# Patient Record
Sex: Female | Born: 1950 | Hispanic: No | Marital: Married | Smoking: Never smoker
Health system: Southern US, Community
[De-identification: ages and names within clinical notes are randomized; demographics above are authoritative.]

## PROBLEM LIST (undated history)

## (undated) DIAGNOSIS — D649 Anemia, unspecified: Secondary | ICD-10-CM

## (undated) DIAGNOSIS — G4730 Sleep apnea, unspecified: Secondary | ICD-10-CM

## (undated) DIAGNOSIS — I10 Essential (primary) hypertension: Secondary | ICD-10-CM

## (undated) DIAGNOSIS — E119 Type 2 diabetes mellitus without complications: Secondary | ICD-10-CM

## (undated) DIAGNOSIS — N189 Chronic kidney disease, unspecified: Secondary | ICD-10-CM

## (undated) DIAGNOSIS — F32A Depression, unspecified: Secondary | ICD-10-CM

## (undated) DIAGNOSIS — K219 Gastro-esophageal reflux disease without esophagitis: Secondary | ICD-10-CM

## (undated) DIAGNOSIS — E785 Hyperlipidemia, unspecified: Secondary | ICD-10-CM

## (undated) DIAGNOSIS — A419 Sepsis, unspecified organism: Secondary | ICD-10-CM

## (undated) DIAGNOSIS — S46009A Unspecified injury of muscle(s) and tendon(s) of the rotator cuff of unspecified shoulder, initial encounter: Secondary | ICD-10-CM

## (undated) DIAGNOSIS — G600 Hereditary motor and sensory neuropathy: Secondary | ICD-10-CM

## (undated) DIAGNOSIS — F329 Major depressive disorder, single episode, unspecified: Secondary | ICD-10-CM

## (undated) HISTORY — DX: Sleep apnea, unspecified: G47.30

## (undated) HISTORY — DX: Depression, unspecified: F32.A

## (undated) HISTORY — DX: Chronic kidney disease, unspecified: N18.9

## (undated) HISTORY — PX: HERNIA REPAIR: SHX51

## (undated) HISTORY — DX: Gastro-esophageal reflux disease without esophagitis: K21.9

## (undated) HISTORY — DX: Unspecified injury of muscle(s) and tendon(s) of the rotator cuff of unspecified shoulder, initial encounter: S46.009A

## (undated) HISTORY — DX: Major depressive disorder, single episode, unspecified: F32.9

## (undated) HISTORY — PX: FRACTURE SURGERY: SHX138

## (undated) HISTORY — DX: Hyperlipidemia, unspecified: E78.5

## (undated) HISTORY — PX: OTHER SURGICAL HISTORY: SHX169

## (undated) HISTORY — DX: Sepsis, unspecified organism: A41.9

## (undated) HISTORY — DX: Essential (primary) hypertension: I10

## (undated) HISTORY — DX: Hereditary motor and sensory neuropathy: G60.0

## (undated) HISTORY — DX: Anemia, unspecified: D64.9

## (undated) HISTORY — DX: Type 2 diabetes mellitus without complications: E11.9

## (undated) MED FILL — Iron Sucrose Inj 20 MG/ML (Fe Equiv): INTRAVENOUS | Qty: 10 | Status: AC

---

## 2005-12-17 ENCOUNTER — Ambulatory Visit: Payer: Self-pay | Admitting: Family Medicine

## 2008-08-31 ENCOUNTER — Ambulatory Visit: Payer: Self-pay | Admitting: Family Medicine

## 2009-06-04 ENCOUNTER — Ambulatory Visit: Payer: Self-pay | Admitting: Family Medicine

## 2009-08-20 ENCOUNTER — Ambulatory Visit: Payer: Self-pay | Admitting: Family Medicine

## 2009-09-23 ENCOUNTER — Ambulatory Visit: Payer: Self-pay | Admitting: Family Medicine

## 2009-09-30 ENCOUNTER — Ambulatory Visit: Payer: Self-pay | Admitting: Urology

## 2009-10-03 ENCOUNTER — Ambulatory Visit: Payer: Self-pay | Admitting: Urology

## 2009-10-28 ENCOUNTER — Ambulatory Visit: Payer: Self-pay | Admitting: Urology

## 2010-02-24 ENCOUNTER — Ambulatory Visit: Payer: Self-pay | Admitting: Urology

## 2010-07-24 ENCOUNTER — Ambulatory Visit: Payer: Self-pay | Admitting: Family Medicine

## 2011-02-26 ENCOUNTER — Ambulatory Visit: Payer: Self-pay | Admitting: Family Medicine

## 2011-08-20 DIAGNOSIS — S46009A Unspecified injury of muscle(s) and tendon(s) of the rotator cuff of unspecified shoulder, initial encounter: Secondary | ICD-10-CM

## 2011-08-20 HISTORY — DX: Unspecified injury of muscle(s) and tendon(s) of the rotator cuff of unspecified shoulder, initial encounter: S46.009A

## 2013-06-07 ENCOUNTER — Ambulatory Visit: Payer: Self-pay | Admitting: Family Medicine

## 2013-08-08 ENCOUNTER — Inpatient Hospital Stay: Payer: Self-pay | Admitting: Internal Medicine

## 2013-08-08 LAB — URINALYSIS, COMPLETE
Bacteria: NONE SEEN
Glucose,UR: NEGATIVE mg/dL (ref 0–75)
Leukocyte Esterase: NEGATIVE
Nitrite: NEGATIVE
Ph: 5 (ref 4.5–8.0)
RBC,UR: 1 /HPF (ref 0–5)
Specific Gravity: 1.01 (ref 1.003–1.030)
WBC UR: 2 /HPF (ref 0–5)

## 2013-08-08 LAB — COMPREHENSIVE METABOLIC PANEL
Albumin: 3.8 g/dL (ref 3.4–5.0)
Alkaline Phosphatase: 26 U/L — ABNORMAL LOW (ref 50–136)
Anion Gap: 10 (ref 7–16)
Bilirubin,Total: 0.4 mg/dL (ref 0.2–1.0)
Chloride: 105 mmol/L (ref 98–107)
Co2: 22 mmol/L (ref 21–32)
Creatinine: 1.56 mg/dL — ABNORMAL HIGH (ref 0.60–1.30)
EGFR (African American): 41 — ABNORMAL LOW
EGFR (Non-African Amer.): 35 — ABNORMAL LOW
Potassium: 4.4 mmol/L (ref 3.5–5.1)
SGOT(AST): 49 U/L — ABNORMAL HIGH (ref 15–37)
Sodium: 137 mmol/L (ref 136–145)
Total Protein: 7.5 g/dL (ref 6.4–8.2)

## 2013-08-08 LAB — CK-MB: CK-MB: 6.7 ng/mL — ABNORMAL HIGH (ref 0.5–3.6)

## 2013-08-08 LAB — CBC WITH DIFFERENTIAL/PLATELET
Basophil %: 0.9 %
Eosinophil %: 0.2 %
Lymphocyte %: 14.9 %
MCH: 27.7 pg (ref 26.0–34.0)
MCHC: 33.5 g/dL (ref 32.0–36.0)
Monocyte #: 1.3 x10 3/mm — ABNORMAL HIGH (ref 0.2–0.9)
Monocyte %: 10.3 %
Neutrophil #: 9.2 10*3/uL — ABNORMAL HIGH (ref 1.4–6.5)
RBC: 3.82 10*6/uL (ref 3.80–5.20)

## 2013-08-08 LAB — CK TOTAL AND CKMB (NOT AT ARMC)
CK, Total: 1588 U/L — ABNORMAL HIGH (ref 21–215)
CK, Total: 1802 U/L — ABNORMAL HIGH (ref 21–215)

## 2013-08-08 LAB — RAPID INFLUENZA A&B ANTIGENS

## 2013-08-08 LAB — TROPONIN I
Troponin-I: <0.02 ng/mL
Troponin-I: <0.02 ng/mL

## 2013-08-09 LAB — CBC WITH DIFFERENTIAL/PLATELET
Basophil #: 0.1 10*3/uL (ref 0.0–0.1)
Eosinophil #: 0.2 10*3/uL (ref 0.0–0.7)
Eosinophil %: 2.5 %
Lymphocyte #: 2.3 10*3/uL (ref 1.0–3.6)
MCV: 84 fL (ref 80–100)
Monocyte #: 0.9 x10 3/mm (ref 0.2–0.9)
Monocyte %: 11.3 %
Neutrophil %: 55 %
RDW: 15.3 % — ABNORMAL HIGH (ref 11.5–14.5)
WBC: 7.5 10*3/uL (ref 3.6–11.0)

## 2013-08-09 LAB — BASIC METABOLIC PANEL
Anion Gap: 5 — ABNORMAL LOW (ref 7–16)
BUN: 20 mg/dL — ABNORMAL HIGH (ref 7–18)
Co2: 24 mmol/L (ref 21–32)
EGFR (African American): 54 — ABNORMAL LOW
EGFR (Non-African Amer.): 47 — ABNORMAL LOW
Glucose: 134 mg/dL — ABNORMAL HIGH (ref 65–99)
Potassium: 4.7 mmol/L (ref 3.5–5.1)

## 2013-08-09 LAB — URINE CULTURE

## 2013-08-10 LAB — BASIC METABOLIC PANEL
Anion Gap: 5 — ABNORMAL LOW (ref 7–16)
BUN: 13 mg/dL (ref 7–18)
Chloride: 115 mmol/L — ABNORMAL HIGH (ref 98–107)
Co2: 22 mmol/L (ref 21–32)
Creatinine: 1.16 mg/dL (ref 0.60–1.30)
EGFR (African American): 59 — ABNORMAL LOW
Glucose: 141 mg/dL — ABNORMAL HIGH (ref 65–99)
Osmolality: 286 (ref 275–301)
Potassium: 4.9 mmol/L (ref 3.5–5.1)
Sodium: 142 mmol/L (ref 136–145)

## 2013-08-11 LAB — BASIC METABOLIC PANEL
BUN: 12 mg/dL (ref 7–18)
Calcium, Total: 8.4 mg/dL — ABNORMAL LOW (ref 8.5–10.1)
Creatinine: 1.08 mg/dL (ref 0.60–1.30)
EGFR (African American): >60
EGFR (Non-African Amer.): 55 — ABNORMAL LOW
Glucose: 152 mg/dL — ABNORMAL HIGH (ref 65–99)
Sodium: 144 mmol/L (ref 136–145)

## 2013-08-11 LAB — CK: CK, Total: 529 U/L — ABNORMAL HIGH (ref 21–215)

## 2013-08-13 LAB — CULTURE, BLOOD (SINGLE)

## 2013-11-08 ENCOUNTER — Ambulatory Visit: Payer: Self-pay | Admitting: Family Medicine

## 2013-11-19 ENCOUNTER — Ambulatory Visit: Payer: Self-pay | Admitting: Family Medicine

## 2013-12-19 ENCOUNTER — Ambulatory Visit: Payer: Self-pay | Admitting: Family Medicine

## 2014-01-17 ENCOUNTER — Ambulatory Visit: Payer: Self-pay | Admitting: Family Medicine

## 2014-10-31 ENCOUNTER — Ambulatory Visit: Payer: Self-pay | Admitting: Family Medicine

## 2014-12-31 ENCOUNTER — Inpatient Hospital Stay: Payer: Self-pay | Admitting: Internal Medicine

## 2015-02-08 NOTE — H&P (Signed)
PATIENT NAME:  Maria Singh, Maria Singh MR#:  [id number] DATE OF BIRTH:  Sep 23, 1951  DATE OF ADMISSION:  08/08/2013  PRIMARY CARE PHYSICIAN: Otilio Miu  REFERRING PHYSICIAN:  Dr. Beather Arbour  CHIEF COMPLAINT: Fever, chills, weakness, body pains.   HISTORY OF PRESENT ILLNESS: The patient is a 64 year old, pleasant Caucasian female with a past medical history of hypertension, diabetes mellitus and hyperlipidemia, who is presenting to the Emergency Room with chief complaint of fever associated with chills and cough and muscle pains. The patient is reporting that approximately two weeks ago, the patient had an upper respiratory tract infection associated with nasal congestion and cough, which was eventually treated with Z-Pak by Dr. Ronnald Ramp. After finishing the antibiotic, patient started developing back pain and frequent urination, which was treated with Cipro for possible urinary tract infection. The patient is finished with the antibiotic, but then she started developing fever associated with chills and rigors and muscle aches. Also, the patient has been having cough with whitish-yellow phlegm. No blood. No loss of consciousness. No abdominal pain. Denies any nausea, vomiting, or diarrhea. The patient is brought into the ER for fever and body aches associated with chills. 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 Denies any other complaints.   PAST MEDICAL HISTORY: Diabetes mellitus, hypertension, Charcot-Marie-Tooth disease, hyperlipidemia.   PAST SURGICAL HISTORY: Hernia repair, right ankle surgery.   ALLERGIES: SULFA.   PSYCHOSOCIAL HISTORY: Lives at home with Dr. Ronnald Ramp. Denies any history of smoking, alcohol or illicit drug usage.   FAMILY HISTORY: Mom deceased with pancreatic cancer. Dad had coronary artery disease.  REVIEW OF SYSTEMS: CONSTITUTIONAL: Denies any weight loss or gain. Complaining of fever, fatigue, weakness, muscle aches.  EYES:  Denies blurry vision, glaucoma.  ENT: Denies epistaxis, discharge. Upper respiratory infection symptoms are resolved.  RESPIRATORY:  Does not have any cough and SOB. CARDIOVASCULAR: No chest pain, palpitations.  GASTROINTESTINAL: Denies nausea, vomiting, diarrhea, abdominal pain.  GENITOURINARY: No dysuria, hematuria. Frequent urination. GYNECOLOGIC AND BREASTS: Denies breast mass or vaginal discharge.  ENDOCRINE: Denies polyuria, nocturia, thyroid problems.  HEMATOLOGIC AND LYMPHATIC: No anemia, easy bruising or bleeding.  INTEGUMENT: Denies rash, lesions.  MUSCULOSKELETAL: Positive diffuse muscle aches with no gout, no redness.  NEUROLOGIC: No vertigo or ataxia.  PSYCHIATRIC: No ADD or OCD.   PHYSICAL EXAMINATION:  VITAL SIGNS: Temperature 100.3 degrees Fahrenheit, pulse 84 to 90, respirations 20 to 22, blood pressure 126/66, pulse oximetry 96% on room air.  GENERAL APPEARANCE: Not in acute distress. Moderately built and obese.  HEENT: Normocephalic, atraumatic. Pupils are equally reactive to light and accommodation. No scleral icterus. No conjunctival injection. No sinus tenderness. No postnasal drip. Dry mucous membranes.  NECK: Supple. No JVD or thyromegaly. Range of motion is intact.  LUNGS: Distant breath sounds with right basilar crackles, wheezing.  CARDIAC: S1, S2 normal. Regular rate and rhythm. No murmurs.  GASTROINTESTINAL: Soft, obese. Bowel sounds are positive in all four quadrants. Nontender, nondistended. No hepatosplenomegaly. NEUROLOGIC:  Awake, alert, oriented x3. The patient has chronic bilateral lower extremity weakness and wheelchair-bound in view of Charcot-Marie-Tooth disease.  Reflexes are 2+.  EXTREMITIES: No cyanosis. No clubbing. No edema.  SKIN: Warm to touch. Normal turgor. No rashes. No lesions.  MUSCULOSKELETAL: No joint effusion, tenderness, erythema.  PSYCHIATRIC: Normal mood and affect.   LABS AND IMAGING STUDIES: Glucose 126, BUN 28, creatinine 1.56,  sodium 137, potassium 4.4, chloride 105, CO2 22, GFR 35, serum osmolality and calcium are normal. LFTs are normal  except AST, which is elevated at 49. CK total 1588, CPK MB 7.7. Troponin less than 0.02.  WBC 12.5, hemoglobin 10.6, hematocrit 31.5, platelets 366. Nasal swab for influenza is normal. Urinalysis:  Yellow in color, clear in appearance. Nitrite and leukocyte esterase are negative, mucus is present. Chest x-ray has revealed right basal infiltrate.   ASSESSMENT AND PLAN: A 64 year old Caucasian female, presenting to the ER with a chief complaint of fever, weakness and muscle aches. Will be admitted with the following assessment and plan.  1.  Right-sided pneumonia, which is probably community-acquired.  Will provide the patient with IV Rocephin and Zithromax. A sputum culture will be obtained. 2.  Acute rhabdomyolysis. Etiology is unclear. We will provide aggressive hydration with IV fluids and monitor CK trend. 3.  Renal insufficiency. Will provide IV fluids.  4.  Diabetes mellitus. The patient will be on sliding scale insulin.  5.  Hypertension. Blood pressure is stable. Will resume home medications and avoid nephrotoxins.  6.  Hyperlipidemia. Will hold off on the statin at this time in view of rhabdomyolysis.  7.  Will provide gastrointestinal and deep venous thrombosis prophylaxis.  CODE STATUS:  The patient is full code.  Dr. Ronnald Ramp is her medical power of attorney.   TOTAL TIME SPENT ON ADMISSION: 45 minutes.   Diagnosis and plan of care was discussed in detail with the patient. She verbalized understanding of the plan.  ____________________________ Nicholes Mango, MD ag:cg D: 08/08/2013 04:11:23 ET T: 08/08/2013 05:34:13 ET JOB#: [id number]  cc: Nicholes Mango, MD, <Dictator> Juline Patch, MD  Nicholes Mango MD ELECTRONICALLY SIGNED 08/24/2013 4:56

## 2015-02-08 NOTE — Discharge Summary (Signed)
PATIENT NAME:  Maria Singh, Maria Singh MR#:  [id number] DATE OF BIRTH:  05-25-1951  DATE OF ADMISSION:  08/08/2013 DATE OF DISCHARGE:  08/11/2013  ADMITTING PHYSICIAN: Nicholes Mango, MD  DISCHARGING PHYSICIAN: Gladstone Lighter, MD  PRIMARY CARE PHYSICIAN: Otilio Miu, MD  [place]: None   DISCHARGE DIAGNOSES:  1.  Viral syndrome.  2.  Charcot-Marie disease and wheelchair bound status.  3.  Acute renal failure.  4.  Hypertension.  5.  Diabetes mellitus.   DISCHARGE HOME MEDICATIONS:  1.  Metformin 1000 mg p.o. b.i.d.  2.  Zoloft 100 mg p.o. daily.  3.  Aspirin 81 mg p.o. daily.  4.  Tylenol 500 mg at bedtime as needed.  5.  Tramadol 50 mg twice a day p.r.n.   DISCHARGE ACTIVITY: As tolerated.   DISCHARGE DIET: Low-sodium, ADA diet.  HOME HEALTH SERVICES: Physical therapy and nursing.  FOLLOWUP INSTRUCTIONS: 1.  PCP follow-up in 1 to 2 weeks.  2.  Please check fingersticks at least twice a day and call if blood sugar is less than 60 or greater than 250.  3.  Hold off Levemir and Benicar until seen by PCP and hold off on Crestor and TriCor for 6 weeks for now. 4.  Repeat CPK levels in 10 days.   LABORATORY AND IMAGING STUDIES PRIOR TO DISCHARGE: Sodium 144, potassium 5.7, chloride 116, bicarb 22, BUN 12, creatinine 1.08, glucose 152 and calcium of 8.4. CPK prior to discharge was 529. WBC 7.5, hemoglobin 9.8, hematocrit 29.2, platelet count 308. Hemoglobin A1c is elevated at 7.2. Her CK on admission was 1588. Blood cultures remained negative. Chest x-ray showing clear lung fields. No acute cardiopulmonary disease. Influenza antigens are negative. Urinalysis negative for any infection.   BRIEF HOSPITAL COURSE: Ms. Provencal is a 64 year old pleasant Caucasian female with past medical history significant for Charcot-Marie disease, hypertension and diabetes who is wheelchair bound at baseline presented to the hospital secondary to muscle aches and fevers and chills. 1. Viral  syndrome. The patient initially presented with high fevers, muscle aches and recently was treated for upper respiratory tract infection and also UTI. Initially thought to be admitted for pneumonia, however, chest x-ray was completely clear. She did not have respiratory symptoms. Urinalysis was negative. She was empirically started on Rocephin and azithromycin. However, her fever curve followed more of a viral fever so antibiotics have been stopped at the time of discharge. She has been fever free for more than 48 hours now.  2.  Rhabdomyolysis, likely from the viral syndrome, also she was on statin at the same time so IV fluids were given and her CPK level is improving and her statin and TriCor are being held at the time of discharge.  3.  Hypertension. The patient was taking Benicar at home. However, blood pressure not elevated while in the hospital, off of medications, so she is not being discharged on Benicar at this time and that needs to be followed by PCP.  4.  Insulin-dependent diabetes mellitus. The patient was on metformin and Levemir 50 units twice a day at home along with NovoLog 12 units pre-meals; however, all her diabetic medications were held when she initially came in and the patient's  sugar was always in the 150 range so her metformin is being restarted at the time of discharge since her renal function has improved, but she was asked to check with her PCP before restarting the insulin. Also she was advised to check fingersticks at least twice a day  and she will be discharged with home health nursing as well.  5.  Hypokalemia. Because the patient was hypokalemic on admission, she was receiving IV fluids with continuous potassium in it. So those fluids were stopped at the time of discharge and she got a dose of Lasix. She was not symptomatic.  DISCHARGE CONDITION: Stable.  DISCHARGE DISPOSITION: Home with home health.  Hospital bed and bedside commode was also prescribed.   TIME SPENT ON  DISCHARGE: 45 minutes.   ____________________________ Gladstone Lighter, MD rk:sb D: 08/11/2013 15:11:47 ET T: 08/11/2013 16:58:04 ET JOB#: [id number]  cc: Gladstone Lighter, MD, <Dictator> Juline Patch, MD Gladstone Lighter MD ELECTRONICALLY SIGNED 08/29/2013 13:47

## 2015-02-11 ENCOUNTER — Other Ambulatory Visit: Payer: Self-pay

## 2015-02-12 ENCOUNTER — Other Ambulatory Visit: Payer: Self-pay

## 2015-02-17 NOTE — H&P (Signed)
PATIENT NAME:  Maria Singh, Maria Singh MR#:  [id number] DATE OF BIRTH:  21-Oct-1950  DATE OF ADMISSION:  12/31/2014  CHIEF COMPLAINT: Nausea, abdominal pain, along with vomiting.   HISTORY OF PRESENTING ILLNESS: A 64 year old female patient with a history of hypertension and diabetes, had some nausea along with epigastric abdominal pain about 4 days back, which resolved on its own. But since yesterday, the patient has had multiple episodes of vomiting without any blood and severe epigastric pain and presented to the Emergency Room. The patient tried to stand up at home and almost had a presyncopal episode. The patient has not had any diarrhea. No fever. Here in the Emergency Room, she has been found to have severely elevated lactic acid of 6.6, hypotensive in the systolic of 51W, along with CT scan showing enteritis, admitted to the hospitalist service.   The patient has not used any recent antibiotics no medication changes, other than Victozfa. Does not use any NSAIDs. Never had a colonoscopy. No sick contacts, no travel.   PAST MEDICAL HISTORY: 1. Diabetes mellitus.  2. Hypertension.  3. Charcot-Marie-Tooth.  4. Ventral hernia repair.  5. Right arm fracture in the past.   HOME MEDICATIONS: 1. Zoloft 100 mg daily.  2. Victoza 0.6 mL subcutaneous once a day.  3. NovoLog 20 units subcutaneously daily with meals.  4. Metformin 500 mg 2 times a day.  5. Losartan 50 mg daily.  6. Levemir 70 units subcutaneous 2 times a day. Script. 7. Crestor 10 mg daily.  8. Aspirin 81 mg daily.   FAMILY HISTORY: Diabetes. No colon cancer.   SOCIAL HISTORY: The patient does not smoke. No alcohol. No illicit drugs. Lives at home with her spouse.   REVIEW OF SYSTEMS: Please see history of present illness. The of the systems reviewed and negative.   ALLERGIES: SULFA.   PHYSICAL EXAMINATION: VITAL SIGNS: Temperature 98.3, pulse of 93, respirations 18, blood pressure 93/58, saturating 96% on room air.  GENERAL:  Morbidly obese, Caucasian female patient, lying in bed, in mild distress secondary to abdominal pain.  PSYCHIATRIC: Alert and oriented x 3, pleasant.  HEENT: Atraumatic, normocephalic. Oral mucosa dry and pink. External ears normal.  NECK: Supple. No thyromegaly. No palpable lymph nodes. Trachea midline. No  JVD.  CARDIOVASCULAR: S1, S2, tachycardic. No murmurs.  RESPIRATORY: Normal work of breathing. Clear to auscultation on both sides.  GASTROINTESTINAL: Soft abdomen. Tenderness in the epigastric area, no rigidity or guarding.  EXTREMITIES: No swelling.  MUSCULOSKELETAL: No joint swelling, redness, effusion.   LABORATORY STUDIES: Glucose of 373 with BUN 27, creatinine 1.23. Sodium 138, potassium 4.7, chloride 107, bicarbonate 16. Lactic acid 6.6. AST, ALT, alkaline phosphatase, bilirubin: Normal. Troponin less than 0.03. WBC 23.8, hemoglobin 12.7, platelets of 416,000.  Urinalysis shows no bacteria. EKG shows normal sinus rhythm. Ultrasound of the right upper quadrant shows no gallstones. Some fatty liver disease. Lower extremity Doppler showed no DVT. CT scan of the abdomen shows enteritis.   ASSESSMENT AND PLAN: 1. Enteritis with severe sepsis. Likely bacterial infection. We will start her on IV antibiotics, along with IV fluid boluses due to her severe sepsis, hypotension, elevated lactic acid. The patient is critically ill. Will need inpatient admission. Send for culture results. Consult GI for further input with the case. Further management as per the patient's response to this treatment.  2. Insulin-dependent diabetes mellitus. The patient will be placed on 1/2 the dose of Levemir, as she is n.p.o., and put her on sliding scale insulin.  3. Hypertension. Hold medications due to hypotension.  4. Deep vein thrombosis prophylaxis with Lovenox.   CODE STATUS: Full code.   TIME SPENT TODAY ON THIS CASE: 50 minutes of critical care time.      ____________________________ Leia Alf Mycheal Veldhuizen,  MD srs:mw D: 12/31/2014 15:11:54 ET T: 12/31/2014 15:45:34 ET JOB#: [id number]  cc: Alveta Heimlich R. Meilin Brosh, MD, <Dictator> Neita Carp MD ELECTRONICALLY SIGNED 01/01/2015 8:30

## 2015-02-17 NOTE — Discharge Summary (Signed)
PATIENT NAME:  ZAYDEN, HAHNE MR#:  [id number] DATE OF BIRTH:  July 03, 1951  DATE OF ADMISSION:  12/31/2014 DATE OF DISCHARGE:  01/02/2015  DISCHARGE DIAGNOSES: 1. Acute infectious enteritis.  2. Severe sepsis.  3. Dehydration.  4. Gastroesophageal reflux disease.   5. Insulin-dependent diabetes mellitus.  6. Hypertension.  7. Morbid obesity.  8. Anemia of chronic disease.   CONSULTATIONS: Dr. Allen Norris with GI.   IMAGING STUDIES: CT scan of the abdomen and pelvis showed enteritis.   ADMITTING HISTORY AND PHYSICAL AND HOSPITAL COURSE: Please see detailed H and P dictated previously.   In brief, a 64 year old morbidly obese female patient with history of hypertension, diabetes, presented to the hospital complaining of abdominal pain and vomiting. The patient was found to have severe sepsis along with enteritis on the CT scan of the abdomen and pelvis, was started on IV antibiotics with Zosyn. The patient has improved well during her 2 day hospital stay. Cultures have been negative. She is afebrile. White count has returned to normal. The patient is being switched to oral ciprofloxacin and Flagyl as outpatient. The patient was seen by GI who suggested getting a screening colonoscopy as outpatient, but no acute interventions at this point.   The patient's other comorbidities have been fairly controlled during the hospital stay.   Prior to discharge, the patient has no abdominal tenderness, lungs sound clear, S1, S2 heard.   DISCHARGE MEDICATIONS: 1. Aspirin 81 mg daily.  2. Zoloft 100 mg daily.  3. Metformin 500 mg oral 2 times a day.  4. Levbid 70 units subcutaneous 2 times a day.  5. NovoLog 20 units subcutaneously with meals.  6. Victoza 0.6 mL subcutaneous 2 times a day.  7. Losartan 50 mg daily.  8. Crestor 10 mg daily.  9. Protonix 40 mg daily.  10. Ciprofloxacin 500 mg oral 2 times a day for 1 week.  11. Flagyl 500 mg oral 3 times a day for 1 week.   DISCHARGE INSTRUCTIONS:  Carbohydrate controlled diet. Activity as tolerated. Follow up with primary care physician in 1-2 weeks and Dr. Allen Norris in 4 weeks for a routine screening colonoscopy.   TIME SPENT ON DAY OF DISCHARGE IN DISCHARGE ACTIVITY:  Forty minutes.    ____________________________ Leia Alf Casaundra Takacs, MD srs:tr D: 01/02/2015 15:36:38 ET T: 01/02/2015 16:30:32 ET JOB#: [id number]  cc: Alveta Heimlich R. Darvin Neighbours, MD, <Dictator> Juline Patch, MD Lucilla Lame, MD Neita Carp MD ELECTRONICALLY SIGNED 01/08/2015 2:23

## 2015-02-17 NOTE — Consult Note (Signed)
Brief Consult Note: Diagnosis: Recurrent nausea & vomiting.   Patient was seen by consultant.   Comments: [name] is a very pleasant 64 y/o female with recurrent nausea, vomiting & abdominal pain.  1st episode was 1 week ago and lasted roughly 24 hrs.  The second episode began 1230AM.  She has leukocytosis & CT suggests nonspecific mild small bowel inflammation enteritis versus panniculitis.  She also has significant heartburn this morning.  Plan: 1) Agree with IVFs, antiemetics, pain control, supportive measures, antibiotics 2) pantoprazole 40mg  IV daily 3) Will consider EGD if N/V persists & will review her studies with Dr Lucilla Lame & make further recommendations Thanks for allowing [place] to participate in her care.  Please see full dictated note. #[id number].  Electronic Signatures for Addendum Section:  Andria Meuse (NP) (Signed Addendum 14-Mar-16 12:09)  I have discussed her care with Dr Evangeline Gula [facility] & our plan is as above   Electronic Signatures: Andria Meuse (NP)  (Signed 14-Mar-16 18:06)  Authored: Brief Consult Note   Last Updated: 14-Mar-16 18:06 by Andria Meuse (NP)

## 2015-02-17 NOTE — Consult Note (Signed)
PATIENT NAME:  Maria Singh, Maria Singh MR#:  [id number] DATE OF BIRTH:  05-23-1951  DATE OF CONSULTATION:  12/31/2014  CONSULTING PHYSICIAN:  Andria Meuse, NP  REQUESTING PHYSICIAN: Dr. Hillary Bow.  PRIMARY CARE PHYSICIAN:  Dr. Otilio Miu   REASON FOR CONSULTATION:  Enteritis.   HISTORY OF PRESENT ILLNESS: Maria Singh is a 64 year old female who developed acute nausea, vomiting and severe upper abdominal pain one week ago. Maria Singh reports the pain was 10 out of 10 on a pain scale. Maria Singh had significant amount of abdominal distention and horrendous belching. Maria Singh vomited several times the following day and then the symptoms resolved. Yesterday, Maria Singh began to have a nagging headache and nausea. At 12:30 a.m. Maria Singh awakened with nausea and vomiting at least 8 times.  The food was mostly undigested. Maria Singh has been having formed bowel movements, the last one was yesterday. Maria Singh denies any diarrhea. Maria Singh was admitted with a white blood cell count of 23.8 and a lactic acid of 5.7 and was started on antibiotics for sepsis. Her hemoglobin and hematocrit are normal. Her daughter had a fever last week but had upper respiratory symptoms. No gastrointestinal symptoms. Maria Singh has not travel lately. Maria Singh denies any ill contacts other than her daughter. Maria Singh did have some constipation last week. Maria Singh has not taken any medications for this but Maria Singh did not go any further than two days without a bowel movement. Maria Singh denies any rectal bleeding, melena or mucus in her stools. Her weight has remained stable. Her appetite was fine until this acute illness. Maria Singh did take two doses of Victoza since Saturday when Maria Singh started this new medications. Maria Singh denies any recent antibiotics.  Maria Singh has had horrendous heartburn today. Maria Singh had an ultrasound which showed hepatic steatosis. Maria Singh had a CT scan of the abdomen and pelvis which showed left nephrolithiasis and mild small bowel mesenteric inflammation, enteritis versus panniculitis. Maria Singh has never had a  colonoscopy nor an EGD. Maria Singh denies any dysphagia or odynophagia. Maria Singh denies any dysuria, fever or joint pains. Maria Singh rarely takes Tums for heart burn.   PAST MEDICAL AND SURGICAL HISTORY:  Diabetes mellitus, hypertension, Charcot Marie syndrome, ventral hernia repair, right arm fracture, acute renal failure, hyperlipidemia.   HOME MEDICATIONS:  Zoloft and 100 mg daily, Victoza 0.6 mL subcutaneously daily, NovoLog 20 units subcutaneously daily with meals, metformin 500 mg b.i.d., losartan 50 mg daily, Levemir 70 units subcutaneously b.i.d., Crestor 10 mg daily, and aspirin 81 mg daily.   ALLERGIES: Sulfa causes rash.   FAMILY HISTORY: Positive for a mother with pancreatic cancer diagnosed at age 52. Maria Singh also had thyroid disease. There was no colorectal carcinoma or inflammatory bowel disease in the family.   SOCIAL HISTORY: Maria Singh is married to Dr. Otilio Miu. Maria Singh has a daughter who is healthy. Maria Singh denies any tobacco, alcohol or illicit drug use.   REVIEW OF SYSTEMS:   CONSTITUTIONAL: Maria Singh has had some fatigue and lethargy otherwise, negative complete review of systems.    PHYSICAL EXAMINATION:  height 65 inches, weight 303 pounds.  GENERAL:  Maria Singh is a very pleasant, well-developed, obese, Caucasian female who is alert, oriented, and cooperative.  HEENT: Sclerae clear, nonicteric. Conjunctivae pink. Oropharynx pink and moist without any lesions.  NECK: Supple without any mass or thyromegaly.  CHEST: Heart regular rate and rhythm. Normal S1, S2 without murmurs, clicks, rubs, or gallops.  LUNGS: Clear to auscultation bilaterally.  ABDOMEN: Positive bowel sounds x4. Maria Singh does have mild, erythema to her left lower abdomen and  suprapubic area. Abdomen is soft. Maria Singh has a tiny, easily reducible small umbilical hernia which is nontender. Maria Singh has no rebound, tenderness or guarding. No hepatosplenomegaly or mass, although exam is limited given her body habitus.  EXTREMITIES: Without edema or  cyanosis. NEUROLOGIC:  Grossly intact.  PSYCHIATRIC: Maria Singh is alert, pleasant, cooperative, in no acute, normal mood and affect.   LABORATORY STUDIES: Total protein 5.9, albumin 3.1, alkaline phosphatase 24, AST 45, and ALT 34, creatinine 1.23, BUN 27, glucose 325, otherwise normal basic metabolic panel and troponin was negative. Otherwise, see history of present illness.   IMPRESSION: Ms. Haggar is a very pleasant 64 year old female with recurrent nausea, vomiting, and abdominal pain. Her first episode was one week ago and lasted roughly 24 hours. The second episode began at 12:30 a.m.  Maria Singh has leukocytosis and CT suggests nonspecific mild small bowel inflammation, enteritis versus panniculitis. Maria Singh also had significant heartburn this morning. Maria Singh has sepsis and her lactic acid is elevated.  Maria Singh has been started on antibiotic therapy.   PLAN:  1.  Agree with IV fluids antiemetics, pain control, supportive measures and antibiotics.  2.  Pantoprazole 40 mg IV daily.  3.  We will consider esophagogastroduodenoscopy if nausea and vomiting persists.   I have reviewed her in care and imaging studies with Dr. Lucilla Lame in collaborative practice agreement.      ____________________________ Andria Meuse, NP klj:at D: 12/31/2014 18:05:28 ET T: 12/31/2014 18:18:01 ET JOB#: [id number]  cc: Andria Meuse, NP, <Dictator> Juline Patch, MD Andria Meuse FNP ELECTRONICALLY SIGNED 01/04/2015 12:51

## 2015-02-17 NOTE — Consult Note (Signed)
Chief Complaint:  Subjective/Chief Complaint Patient admitted with increase Lactic acid, sepsis and 2 weeks of abd pain with nausea and vomiting. She feels much better now and her WBC have come down.   VITAL SIGNS/ANCILLARY NOTES: **Vital Signs.:   15-Mar-16 16:33  Vital Signs Type Q 8hr  Temperature Temperature (F) 98.2  Celsius 36.7  Temperature Source oral  Pulse Pulse 72  Respirations Respirations 18  Systolic BP Systolic BP 536  Diastolic BP (mmHg) Diastolic BP (mmHg) 79  Mean BP 104  Pulse Ox % Pulse Ox % 96  Pulse Ox Activity Level  At rest  Oxygen Delivery Room Air/ 21 %  *Intake and Output.:   Shift 15-Mar-16 23:00  Grand Totals Intake:  477.93 Output:  500    Net:  -22.07 24 Hr.:  260.3  Oral Intake      In:  0  IV (Secondary)      In:  89.4  Urine ml     Out:  500  Length of Stay Totals Intake:  3666 Output:  3250    Net:  416   Brief Assessment:  GEN well developed, well nourished, no acute distress   Respiratory normal resp effort  no use of accessory muscles   Gastrointestinal Normal   Gastrointestinal details normal Soft  Nontender  Nondistended   Additional Physical Exam Alert and orientated times 3   Lab Results: Routine Chem:  15-Mar-16 05:05   Glucose, Serum  102 (65-99 NOTE: New Reference Range  12/11/14)  BUN  24 (6-20 NOTE: New Reference Range  12/11/14)  Creatinine (comp)  1.01 (0.44-1.00 NOTE: New Reference Range  12/11/14)  Sodium, Serum 143 (135-145 NOTE: New Reference Range  12/11/14)  Potassium, Serum 4.0 (3.5-5.1 NOTE: New Reference Range  12/11/14)  Chloride, Serum  113 (101-111 NOTE: New Reference Range  12/11/14)  CO2, Serum 24 (22-32 NOTE: New Reference Range  12/11/14)  Calcium (Total), Serum  7.8 (8.9-10.3 NOTE: New Reference Range  12/11/14)  Anion Gap  6  eGFR (African American) >60  eGFR (Non-African American)  60 (eGFR values <17m/min/1.73 m2 may be an indication of chronic kidney disease  (CKD). Calculated eGFR is useful in patients with stable renal function. The eGFR calculation will not be reliable in acutely ill patients when serum creatinine is changing rapidly. It is not useful in patients on dialysis. The eGFR calculation may not be applicable to patients at the low and high extremes of body sizes, pregnant women, and vegetarians.)  Routine Hem:  15-Mar-16 05:05   WBC (CBC)  11.1  RBC (CBC) 3.89  Hemoglobin (CBC)  10.1  Hematocrit (CBC)  32.3  Platelet Count (CBC) 203  MCV 83  MCH  25.9  MCHC  31.2  RDW  16.5  Neutrophil % 59.2  Lymphocyte % 16.8  Monocyte % 5.4  Eosinophil % 18.4  Basophil % 0.2  Neutrophil #  6.6  Lymphocyte # 1.9  Monocyte # 0.6  Eosinophil #  2.0  Basophil # 0.0 (Result(s) reported on 01 Jan 2015 at 06:14AM.)   Assessment/Plan:  Assessment/Plan:  Assessment Patient with signs and symptoms consistant with abdminal angina and ischemia. The high white cell count with increased lactic acid made worse with dehydration and better when hydrated. She is also at risk with DM and immobility.   Plan the patient is not having pain at the present time. She continues to have nausea. The patient will have an upper endoscopy as an outpatient at the nausea continues. She  is also been told that she has never had a screening colonoscopy and will need aa colonoscopy as an outpatient. If the patient's symptoms recur she may need an MRI/MRA.   Electronic Signatures: Lucilla Lame (MD)  (Signed 15-Mar-16 18:07)  Authored: Chief Complaint, VITAL SIGNS/ANCILLARY NOTES, Brief Assessment, Lab Results, Assessment/Plan   Last Updated: 15-Mar-16 18:07 by Lucilla Lame (MD)

## 2015-04-02 ENCOUNTER — Other Ambulatory Visit: Payer: Self-pay | Admitting: Urgent Care

## 2015-04-08 ENCOUNTER — Other Ambulatory Visit: Payer: Self-pay

## 2015-04-08 NOTE — Patient Outreach (Signed)
[facility]) Care Management  04/08/2015  Maria Singh Nov 06, 1950 [id number]   Spoke to patient by phone- she started Victoza 1.8mg  on 03/21/15- she has no complaints with the medication and has continued to assess her blood sugars- current blood sugars 130-165mg /dl , down from 160-190mg /dl when she started Victoza at the beginning of the month.   She continues on 20 units of Novolog pre meals and Levemir 45 units bid.  She also takes Metformin 500mg  qam and 1000mg  bid.  She is having an A1C draw by the end of the month- she will follow up with me then.   Gentry Fitz, RN, BA, [place], [place] Direct Dial:  [phone]  Fax:  [phone] E-mail: [name] [address], [place], [place]  [address]

## 2015-05-21 ENCOUNTER — Ambulatory Visit (INDEPENDENT_AMBULATORY_CARE_PROVIDER_SITE_OTHER): Payer: 59 | Admitting: Family Medicine

## 2015-05-21 ENCOUNTER — Encounter: Payer: Self-pay | Admitting: Family Medicine

## 2015-05-21 VITALS — BP 130/60 | HR 84 | Ht 65.0 in | Wt 300.0 lb

## 2015-05-21 DIAGNOSIS — E785 Hyperlipidemia, unspecified: Secondary | ICD-10-CM | POA: Diagnosis not present

## 2015-05-21 DIAGNOSIS — E119 Type 2 diabetes mellitus without complications: Secondary | ICD-10-CM | POA: Diagnosis not present

## 2015-05-21 DIAGNOSIS — F322 Major depressive disorder, single episode, severe without psychotic features: Secondary | ICD-10-CM | POA: Diagnosis not present

## 2015-05-21 DIAGNOSIS — K219 Gastro-esophageal reflux disease without esophagitis: Secondary | ICD-10-CM

## 2015-05-21 DIAGNOSIS — I10 Essential (primary) hypertension: Secondary | ICD-10-CM

## 2015-05-21 DIAGNOSIS — G600 Hereditary motor and sensory neuropathy: Secondary | ICD-10-CM | POA: Diagnosis not present

## 2015-05-21 DIAGNOSIS — F329 Major depressive disorder, single episode, unspecified: Secondary | ICD-10-CM

## 2015-05-21 NOTE — Progress Notes (Signed)
Name: Maria Singh   MRN: [id number]    DOB: [birth year]   Date:05/21/2015       Progress Note  Subjective  Chief Complaint  Chief Complaint  Patient presents with  . Diabetes  . Hypertension  . Gastrophageal Reflux  . Hyperlipidemia  . Depression    Diabetes She presents for her follow-up diabetic visit. She has type 2 diabetes mellitus. Her disease course has been fluctuating. Hypoglycemia symptoms include mood changes. Pertinent negatives for hypoglycemia include no confusion, dizziness, headaches, hunger, nervousness/anxiousness, pallor, seizures, sleepiness, speech difficulty, sweats or tremors. Pertinent negatives for diabetes include no blurred vision, no chest pain, no fatigue, no foot paresthesias, no foot ulcerations, no polydipsia, no polyphagia, no polyuria, no visual change, no weakness and no weight loss. There are no hypoglycemic complications. Symptoms are stable. Pertinent negatives for diabetic complications include no autonomic neuropathy, CVA, heart disease, nephropathy, peripheral neuropathy, PVD or retinopathy. There are no known risk factors for coronary artery disease. Current diabetic treatment includes diet, insulin injections and oral agent (dual therapy). She is compliant with treatment all of the time. She is currently taking insulin pre-breakfast and pre-dinner. Insulin injections are given by patient. Rotation sites for injection include the abdominal wall. Her weight is stable. She is following a generally healthy diet. She has had a previous visit with a dietitian. She monitors blood glucose at home 1-2 x per day. Blood glucose monitoring compliance is excellent. There is no change in her home blood glucose trend. Her breakfast blood glucose is taken between 8-9 am. Her breakfast blood glucose range is generally 140-180 mg/dl. An ACE inhibitor/angiotensin II receptor blocker is being taken. She does not see a podiatrist.Eye exam is current.  Hypertension This is  a recurrent problem. The current episode started more than 1 year ago. The problem has been waxing and waning since onset. The problem is controlled. Pertinent negatives include no blurred vision, chest pain, headaches, malaise/fatigue, neck pain, palpitations, shortness of breath or sweats. Agents associated with hypertension include NSAIDs. Risk factors for coronary artery disease include diabetes mellitus, dyslipidemia, obesity and post-menopausal state. Past treatments include ACE inhibitors. The current treatment provides moderate improvement. There are no compliance problems.  There is no history of angina, kidney disease, CAD/MI, CVA, heart failure, left ventricular hypertrophy, PVD, renovascular disease or retinopathy. There is no history of chronic renal disease.  Gastrophageal Reflux She reports no abdominal pain, no belching, no chest pain, no choking, no coughing, no dysphagia, no early satiety, no globus sensation, no heartburn, no hoarse voice, no nausea, no sore throat, no stridor or no wheezing. This is a recurrent problem. The current episode started more than 1 year ago. The problem occurs rarely. The problem has been gradually improving. Nothing aggravates the symptoms. Pertinent negatives include no anemia, fatigue, melena, muscle weakness, orthopnea or weight loss. There are no known risk factors. She has tried a PPI for the symptoms. The treatment provided mild relief.  Hyperlipidemia This is a recurrent problem. The current episode started more than 1 year ago. The problem is controlled. Recent lipid tests were reviewed and are normal. She has no history of chronic renal disease. Pertinent negatives include no chest pain, focal weakness, myalgias or shortness of breath. Current antihyperlipidemic treatment includes statins. There are no compliance problems.  There are no known risk factors for coronary artery disease.  Mental Health Problem The primary symptoms do not include dysphoric  mood, delusions, hallucinations, bizarre behavior, negative symptoms or somatic symptoms. The  current episode started more than 1 month ago.  The degree of incapacity that she is experiencing as a consequence of her illness is mild. Additional symptoms of the illness do not include no anhedonia, no insomnia, no fatigue, no visual change, no headaches, no abdominal pain or no seizures. She does not admit to suicidal ideas. She does not contemplate harming herself.    No problem-specific assessment & plan notes found for this encounter.   Past Medical History  Diagnosis Date  . Depression   . Diabetes mellitus without complication   . Hyperlipidemia   . Hypertension   . GERD (gastroesophageal reflux disease)   . Sleep apnea   . Charcot Marie Tooth muscular atrophy     Past Surgical History  Procedure Laterality Date  . Arm surgery Left     broken arm  . Hernia repair      Family History  Problem Relation Age of Onset  . Cancer Mother   . Heart disease Father   . Diabetes Maternal Grandmother   . Heart disease Paternal Grandfather     History   Social History  . Marital Status: Married    Spouse Name: N/A  . Number of Children: N/A  . Years of Education: N/A   Occupational History  . Not on file.   Social History Main Topics  . Smoking status: Never Smoker   . Smokeless tobacco: Not on file  . Alcohol Use: No  . Drug Use: No  . Sexual Activity: No   Other Topics Concern  . Not on file   Social History Narrative  . No narrative on file    Allergies  Allergen Reactions  . Sulfa Antibiotics Rash     Review of Systems  Constitutional: Negative for fever, chills, weight loss, malaise/fatigue and fatigue.  HENT: Negative for ear discharge, ear pain, hoarse voice and sore throat.   Eyes: Negative for blurred vision.  Respiratory: Negative for cough, sputum production, choking, shortness of breath and wheezing.   Cardiovascular: Negative for chest pain,  palpitations and leg swelling.  Gastrointestinal: Negative for heartburn, dysphagia, nausea, abdominal pain, diarrhea, constipation, blood in stool and melena.  Genitourinary: Negative for dysuria, urgency, frequency and hematuria.  Musculoskeletal: Negative for myalgias, back pain, joint pain, muscle weakness and neck pain.  Skin: Negative for pallor and rash.  Neurological: Negative for dizziness, tingling, tremors, sensory change, focal weakness, seizures, speech difficulty, weakness and headaches.  Endo/Heme/Allergies: Negative for environmental allergies, polydipsia and polyphagia. Does not bruise/bleed easily.  Psychiatric/Behavioral: Negative for depression, suicidal ideas, hallucinations, confusion and dysphoric mood. The patient is not nervous/anxious and does not have insomnia.      Objective  Filed Vitals:   05/21/15 1028  BP: 130/60  Pulse: 84  Height: 5\' 5"  (1.651 m)  Weight: 300 lb (136.079 kg)    Physical Exam  Constitutional: She is well-developed, well-nourished, and in no distress. No distress.  HENT:  Head: Normocephalic and atraumatic.  Right Ear: External ear normal.  Left Ear: External ear normal.  Nose: Nose normal.  Mouth/Throat: Oropharynx is clear and moist.  Eyes: Conjunctivae and EOM are normal. Pupils are equal, round, and reactive to light. Right eye exhibits no discharge. Left eye exhibits no discharge.  Neck: Normal range of motion. Neck supple. No JVD present. No thyromegaly present.  Cardiovascular: Normal rate, regular rhythm, normal heart sounds and intact distal pulses.  Exam reveals no gallop and no friction rub.   No murmur heard. Pulmonary/Chest: Effort  normal and breath sounds normal.  Abdominal: Soft. Bowel sounds are normal. She exhibits no mass. There is no tenderness. There is no guarding.  Musculoskeletal: Normal range of motion. She exhibits no edema.  Lymphadenopathy:    She has no cervical adenopathy.  Neurological: She is alert.  She has normal reflexes.  Skin: Skin is warm and dry. She is not diaphoretic.  Psychiatric: Mood and affect normal.      Assessment & Plan  Problem List Items Addressed This Visit    None    Visit Diagnoses    Type 2 diabetes mellitus without complication    -  Primary    Relevant Medications    aspirin 81 MG tablet    rosuvastatin (CRESTOR) 5 MG tablet    losartan (COZAAR) 25 MG tablet    Other Relevant Orders    Renal Function Panel    HgB A1c    TSH    Essential hypertension        Relevant Medications    aspirin 81 MG tablet    rosuvastatin (CRESTOR) 5 MG tablet    losartan (COZAAR) 25 MG tablet    Other Relevant Orders    Renal Function Panel    TSH    CBC    Hyperlipidemia        Relevant Medications    aspirin 81 MG tablet    rosuvastatin (CRESTOR) 5 MG tablet    losartan (COZAAR) 25 MG tablet    Other Relevant Orders    Lipid Profile    Major depression, chronic        Relevant Medications    sertraline (ZOLOFT) 100 MG tablet    Gastroesophageal reflux disease, esophagitis presence not specified        Relevant Medications    docusate sodium (COLACE) 100 MG capsule    pantoprazole (PROTONIX) 40 MG tablet    Other Relevant Orders    CBC    Charcot-Marie disease        Relevant Medications    sertraline (ZOLOFT) 100 MG tablet         Dr. Deanna Wotton [facility]  05/21/2015

## 2015-05-22 LAB — RENAL FUNCTION PANEL
Albumin: 4.2 g/dL (ref 3.6–4.8)
BUN/Creatinine Ratio: 28 — ABNORMAL HIGH (ref 11–26)
BUN: 23 mg/dL (ref 8–27)
CALCIUM: 9.2 mg/dL (ref 8.7–10.3)
CO2: 20 mmol/L (ref 18–29)
CREATININE: 0.83 mg/dL (ref 0.57–1.00)
Chloride: 102 mmol/L (ref 97–108)
GFR calc Af Amer: 87 mL/min/{1.73_m2} (ref >59)
GFR calc non Af Amer: 75 mL/min/{1.73_m2} (ref >59)
GLUCOSE: 142 mg/dL — AB (ref 65–99)
Phosphorus: 3.8 mg/dL (ref 2.5–4.5)
Potassium: 5.9 mmol/L — ABNORMAL HIGH (ref 3.5–5.2)
SODIUM: 141 mmol/L (ref 134–144)

## 2015-05-22 LAB — CBC
HEMATOCRIT: 34.5 % (ref 34.0–46.6)
Hemoglobin: 10.7 g/dL — ABNORMAL LOW (ref 11.1–15.9)
MCH: 25.5 pg — ABNORMAL LOW (ref 26.6–33.0)
MCHC: 31 g/dL — ABNORMAL LOW (ref 31.5–35.7)
MCV: 82 fL (ref 79–97)
Platelets: 239 10*3/uL (ref 150–379)
RBC: 4.2 x10E6/uL (ref 3.77–5.28)
RDW: 16.7 % — ABNORMAL HIGH (ref 12.3–15.4)
WBC: 7.9 10*3/uL (ref 3.4–10.8)

## 2015-05-22 LAB — LIPID PANEL
CHOL/HDL RATIO: 4.7 ratio — AB (ref 0.0–4.4)
CHOLESTEROL TOTAL: 135 mg/dL (ref 100–199)
HDL: 29 mg/dL — ABNORMAL LOW (ref >39)
LDL CALC: 55 mg/dL (ref 0–99)
Triglycerides: 253 mg/dL — ABNORMAL HIGH (ref 0–149)
VLDL Cholesterol Cal: 51 mg/dL — ABNORMAL HIGH (ref 5–40)

## 2015-05-22 LAB — HEMOGLOBIN A1C
Est. average glucose Bld gHb Est-mCnc: 174 mg/dL
HEMOGLOBIN A1C: 7.7 % — AB (ref 4.8–5.6)

## 2015-05-22 LAB — TSH: TSH: 3.44 u[IU]/mL (ref 0.450–4.500)

## 2015-06-04 ENCOUNTER — Other Ambulatory Visit: Payer: Self-pay

## 2015-06-04 NOTE — Patient Outreach (Signed)
[facility]) Care Management  06/04/2015  Maria Singh [birth year] [id number]   Spoke to patient by phone- she's very disappointed with a recent A1C of 7.7% despite being on Victoza 1.8mg /day.  MD spoke about trying an additional medication but [name] asked for another month to make dietary changes.  Current fasting blood sugars 134- 164mg /dl.  [name] has not experienced any neuromotor changes and denies any falls since we last spoke.  F/U tentatively scheduled for 06/19/15- [name] will call me if this time and date does not work for her.   Gentry Fitz, RN, BA, [place], [place] Direct Dial:  [phone]  Fax:  [phone] E-mail: [name] [address] [place], [place]  [address]

## 2015-06-05 ENCOUNTER — Other Ambulatory Visit: Payer: Self-pay

## 2015-06-05 DIAGNOSIS — T61781A Other shellfish poisoning, accidental (unintentional), initial encounter: Secondary | ICD-10-CM

## 2015-06-05 DIAGNOSIS — T781XXA Other adverse food reactions, not elsewhere classified, initial encounter: Secondary | ICD-10-CM

## 2015-06-05 MED ORDER — EPINEPHRINE 0.3 MG/0.3ML IJ SOAJ: 0.3000 mg | Freq: Once | INTRAMUSCULAR | Status: DC

## 2015-06-07 ENCOUNTER — Other Ambulatory Visit: Payer: Self-pay | Admitting: Family Medicine

## 2015-06-07 ENCOUNTER — Other Ambulatory Visit: Payer: Self-pay

## 2015-06-07 DIAGNOSIS — E109 Type 1 diabetes mellitus without complications: Secondary | ICD-10-CM

## 2015-06-07 DIAGNOSIS — E119 Type 2 diabetes mellitus without complications: Secondary | ICD-10-CM

## 2015-06-07 DIAGNOSIS — K5730 Diverticulosis of large intestine without perforation or abscess without bleeding: Secondary | ICD-10-CM

## 2015-06-07 MED ORDER — METRONIDAZOLE 500 MG PO TABS: 500.0000 mg | ORAL_TABLET | Freq: Two times a day (BID) | ORAL | Status: DC

## 2015-06-07 MED ORDER — LIRAGLUTIDE 18 MG/3ML ~~LOC~~ SOPN: 1.8000 mL | PEN_INJECTOR | SUBCUTANEOUS | Status: DC

## 2015-06-07 MED ORDER — CIPROFLOXACIN HCL 500 MG PO TABS: 500.0000 mg | ORAL_TABLET | Freq: Two times a day (BID) | ORAL | Status: DC

## 2015-06-10 ENCOUNTER — Other Ambulatory Visit: Payer: Self-pay | Admitting: Family Medicine

## 2015-06-10 DIAGNOSIS — E119 Type 2 diabetes mellitus without complications: Secondary | ICD-10-CM

## 2015-06-10 DIAGNOSIS — L03317 Cellulitis of buttock: Secondary | ICD-10-CM

## 2015-06-17 ENCOUNTER — Other Ambulatory Visit: Payer: Self-pay | Admitting: Family Medicine

## 2015-06-17 DIAGNOSIS — E119 Type 2 diabetes mellitus without complications: Secondary | ICD-10-CM

## 2015-06-19 ENCOUNTER — Other Ambulatory Visit: Payer: Self-pay

## 2015-06-19 DIAGNOSIS — E1165 Type 2 diabetes mellitus with hyperglycemia: Secondary | ICD-10-CM

## 2015-06-19 DIAGNOSIS — IMO0002 Reserved for concepts with insufficient information to code with codable children: Secondary | ICD-10-CM

## 2015-06-19 NOTE — Patient Outreach (Signed)
Maria Singh [facility]) Care Management  [place]  06/19/2015   Maria Singh 1951/10/19 [id number]  Subjective: Met with patient for her Link to Wellness visit.  She continues to check her sugars on a regular basis and reports most blood sugars (fasting) are 160-168m/dl.  She has not had any hypoglycemia.  She recently saw her MD- A1C 7.7%.  She has numerous adaptive devices that aid in her mobility and she remains active at home.  Objective: Observable change in the mobility in her left arm.  I noticed she is having to aid the arm as she tries to lift it. She does not complain of pain in the left shoulder but reports that a rotator cuff tear from 2012 is causing her to have more difficulty lifting the arm.   Filed Vitals:   06/19/15 1341  BP: 180/82  Pulse: 78  Height: 1.651 m (5' 5")  SpO2: 97%     Current Medications:  Current Outpatient Prescriptions  Medication Sig Dispense Refill  . aspirin 81 MG tablet Take 81 mg by mouth daily.    .Marland Kitchendocusate sodium (COLACE) 100 MG capsule Take 100 mg by mouth daily.     .Marland KitchenEPINEPHrine 0.3 mg/0.3 mL IJ SOAJ injection Inject 0.3 mLs (0.3 mg total) into the muscle once. 1 Device 5  . insulin detemir (LEVEMIR) 100 UNIT/ML injection Inject 45 Units into the skin 2 (two) times daily.    . Liraglutide 18 MG/3ML SOPN Inject 1.8 mLs (10.8 mg total) into the skin 1 day or 1 dose. 6 mL 5  . losartan (COZAAR) 25 MG tablet Take 25 mg by mouth daily.    . metFORMIN (GLUCOPHAGE) 500 MG tablet Take 500 mg by mouth every morning.    . metFORMIN (GLUCOPHAGE) 500 MG tablet Take 1,000 mg by mouth every evening.    . mupirocin ointment (BACTROBAN) 2 % APPLY TO AFFECTED AREA(S) AS NEEDED 22 g 1  . NOVOFINE 32G X 6 MM MISC USE AS DIRECTED 3 TIMES A DAY 300 each 3  . NOVOLOG FLEXPEN 100 UNIT/ML FlexPen INJECT 20 UNITS SUBCUTANEOUSLY THREE TIMES A DAY 60 mL 3  . pantoprazole (PROTONIX) 40 MG tablet Take 40 mg by mouth daily.    . rosuvastatin  (CRESTOR) 5 MG tablet Take 5 mg by mouth daily.    . sertraline (ZOLOFT) 100 MG tablet Take 100 mg by mouth daily.    . ciprofloxacin (CIPRO) 500 MG tablet Take 1 tablet (500 mg total) by mouth 2 (two) times daily. (Patient not taking: Reported on 06/19/2015) 20 tablet 0  . insulin aspart (NOVOLOG) 100 UNIT/ML injection Inject 20 Units into the skin 3 (three) times daily before meals.    . metFORMIN (GLUCOPHAGE) 1000 MG tablet TAKE 1 TABLET BY MOUTH TWICE DAILY (Patient not taking: Reported on 06/19/2015) 180 tablet 1  . metroNIDAZOLE (FLAGYL) 500 MG tablet Take 1 tablet (500 mg total) by mouth 2 (two) times daily. (Patient not taking: Reported on 06/19/2015) 20 tablet 0  . VICTOZA 18 MG/3ML SOPN INJECT 1.8MG SUBCUTANEOUSLY DAILY (Patient not taking: Reported on 06/19/2015) 27 mL 0   No current facility-administered medications for this visit.    Functional Status:  In your present state of health, do you have any difficulty performing the following activities: 06/19/2015 05/21/2015  Hearing? YTempie Donning Vision? N N  Difficulty concentrating or making decisions? N N  Walking or climbing stairs? Y Y  Dressing or bathing? YTempie Donning Doing errands,  shopping? Tempie Donning  Preparing Food and eating ? N -  Using the Toilet? Y -  Managing your Medications? Y -    Fall/Depression Screening: PHQ 2/9 Scores 06/19/2015 05/21/2015  PHQ - 2 Score 0 0    Plan: Fairly well controlled blood sugars- she feels like writing down her food and blood sugars  might aid in lowering the A1C.  I have also suggested she try checking her blood sugars after a meal to see if there are spikes in the blood sugar. Reminded to eat protein with all meals and snacks- she reports doing better by adding breakfast protein but last night she only ate a peach and she feels like her fasting blood sugar is elevated because of it.     Needs preventative care: dental, dilated eye and perhaps a hemacult if no colonoscopy is ordered. I will follow up with her  next month.   Gentry Fitz, RN, BA, [place], [place] Direct Dial:  [phone]  Fax:  [phone] E-mail: [name] [address], [place], [place]  [address]

## 2015-06-19 NOTE — Patient Outreach (Signed)
[name]) Care Management  [place]  06/19/2015   Maria Singh 1951/10/19 [id number]  Subjective: Met with patient for her Link to Wellness visit.  She continues to check her sugars on a regular basis and reports most blood sugars (fasting) are 160-168m/dl.  She has not had any hypoglycemia.  She recently saw her MD- A1C 7.7%.  She has numerous adaptive devices that aid in her mobility and she remains active at home.  Objective: Observable change in the mobility in her left arm.  I noticed she is having to aid the arm as she tries to lift it. She does not complain of pain in the left shoulder but reports that a rotator cuff tear from 2012 is causing her to have more difficulty lifting the arm.   Filed Vitals:   06/19/15 1341  BP: 180/82  Pulse: 78  Height: 1.651 m (5' 5")  SpO2: 97%     Current Medications:  Current Outpatient Prescriptions  Medication Sig Dispense Refill  . aspirin 81 MG tablet Take 81 mg by mouth daily.    .Marland Kitchendocusate sodium (COLACE) 100 MG capsule Take 100 mg by mouth daily.     .Marland KitchenEPINEPHrine 0.3 mg/0.3 mL IJ SOAJ injection Inject 0.3 mLs (0.3 mg total) into the muscle once. 1 Device 5  . insulin detemir (LEVEMIR) 100 UNIT/ML injection Inject 45 Units into the skin 2 (two) times daily.    . Liraglutide 18 MG/3ML SOPN Inject 1.8 mLs (10.8 mg total) into the skin 1 day or 1 dose. 6 mL 5  . losartan (COZAAR) 25 MG tablet Take 25 mg by mouth daily.    . metFORMIN (GLUCOPHAGE) 500 MG tablet Take 500 mg by mouth every morning.    . metFORMIN (GLUCOPHAGE) 500 MG tablet Take 1,000 mg by mouth every evening.    . mupirocin ointment (BACTROBAN) 2 % APPLY TO AFFECTED AREA(S) AS NEEDED 22 g 1  . NOVOFINE 32G X 6 MM MISC USE AS DIRECTED 3 TIMES A DAY 300 each 3  . NOVOLOG FLEXPEN 100 UNIT/ML FlexPen INJECT 20 UNITS SUBCUTANEOUSLY THREE TIMES A DAY 60 mL 3  . pantoprazole (PROTONIX) 40 MG tablet Take 40 mg by mouth daily.    . rosuvastatin  (CRESTOR) 5 MG tablet Take 5 mg by mouth daily.    . sertraline (ZOLOFT) 100 MG tablet Take 100 mg by mouth daily.    . ciprofloxacin (CIPRO) 500 MG tablet Take 1 tablet (500 mg total) by mouth 2 (two) times daily. (Patient not taking: Reported on 06/19/2015) 20 tablet 0  . insulin aspart (NOVOLOG) 100 UNIT/ML injection Inject 20 Units into the skin 3 (three) times daily before meals.    . metFORMIN (GLUCOPHAGE) 1000 MG tablet TAKE 1 TABLET BY MOUTH TWICE DAILY (Patient not taking: Reported on 06/19/2015) 180 tablet 1  . metroNIDAZOLE (FLAGYL) 500 MG tablet Take 1 tablet (500 mg total) by mouth 2 (two) times daily. (Patient not taking: Reported on 06/19/2015) 20 tablet 0  . VICTOZA 18 MG/3ML SOPN INJECT 1.8MG SUBCUTANEOUSLY DAILY (Patient not taking: Reported on 06/19/2015) 27 mL 0   No current facility-administered medications for this visit.    Functional Status:  In your present state of health, do you have any difficulty performing the following activities: 06/19/2015 05/21/2015  Hearing? YTempie Donning Vision? N N  Difficulty concentrating or making decisions? N N  Walking or climbing stairs? Y Y  Dressing or bathing? YTempie Donning Doing errands,  shopping? Tempie Donning  Preparing Food and eating ? N -  Using the Toilet? Y -  Managing your Medications? Y -    Fall/Depression Screening: PHQ 2/9 Scores 06/19/2015 05/21/2015  PHQ - 2 Score 0 0    Plan: Fairly well controlled blood sugars- she feels like writing down her food and blood sugars  might aid in lowering the A1C.  I have also suggested she try checking her blood sugars after a meal to see if there are spikes in the blood sugar. Reminded to eat protein with all meals and snacks- she reports doing better by adding breakfast protein but last night she only ate a peach and she feels like her fasting blood sugar is elevated because of it.

## 2015-06-19 NOTE — Patient Outreach (Deleted)
[facility]) Care Management   06/19/2015  Raysa Ersie Savino [birth year] [id number]  Senita Nyia Tsao is an 64 y.o. female  Subjective:   Objective:   ROS  Physical Exam  Current Medications:   Current Outpatient Prescriptions  Medication Sig Dispense Refill  . aspirin 81 MG tablet Take 81 mg by mouth daily.    Marland Kitchen docusate sodium (COLACE) 100 MG capsule Take 100 mg by mouth daily.     Marland Kitchen EPINEPHrine 0.3 mg/0.3 mL IJ SOAJ injection Inject 0.3 mLs (0.3 mg total) into the muscle once. 1 Device 5  . insulin detemir (LEVEMIR) 100 UNIT/ML injection Inject 45 Units into the skin 2 (two) times daily.    . Liraglutide 18 MG/3ML SOPN Inject 1.8 mLs (10.8 mg total) into the skin 1 day or 1 dose. 6 mL 5  . losartan (COZAAR) 25 MG tablet Take 25 mg by mouth daily.    . metFORMIN (GLUCOPHAGE) 500 MG tablet Take 500 mg by mouth every morning.    . metFORMIN (GLUCOPHAGE) 500 MG tablet Take 1,000 mg by mouth every evening.    . mupirocin ointment (BACTROBAN) 2 % APPLY TO AFFECTED AREA(S) AS NEEDED 22 g 1  . NOVOFINE 32G X 6 MM MISC USE AS DIRECTED 3 TIMES A DAY 300 each 3  . NOVOLOG FLEXPEN 100 UNIT/ML FlexPen INJECT 20 UNITS SUBCUTANEOUSLY THREE TIMES A DAY 60 mL 3  . pantoprazole (PROTONIX) 40 MG tablet Take 40 mg by mouth daily.    . rosuvastatin (CRESTOR) 5 MG tablet Take 5 mg by mouth daily.    . sertraline (ZOLOFT) 100 MG tablet Take 100 mg by mouth daily.    . ciprofloxacin (CIPRO) 500 MG tablet Take 1 tablet (500 mg total) by mouth 2 (two) times daily. (Patient not taking: Reported on 06/19/2015) 20 tablet 0  . insulin aspart (NOVOLOG) 100 UNIT/ML injection Inject 20 Units into the skin 3 (three) times daily before meals.    . metFORMIN (GLUCOPHAGE) 1000 MG tablet TAKE 1 TABLET BY MOUTH TWICE DAILY (Patient not taking: Reported on 06/19/2015) 180 tablet 1  . metroNIDAZOLE (FLAGYL) 500 MG tablet Take 1 tablet (500 mg total) by mouth 2 (two) times daily. (Patient not taking:  Reported on 06/19/2015) 20 tablet 0  . VICTOZA 18 MG/3ML SOPN INJECT 1.8MG  SUBCUTANEOUSLY DAILY (Patient not taking: Reported on 06/19/2015) 27 mL 0   No current facility-administered medications for this visit.    Functional Status:   In your present state of health, do you have any difficulty performing the following activities: 06/19/2015 05/21/2015  Hearing? Tempie Donning  Vision? N N  Difficulty concentrating or making decisions? N N  Walking or climbing stairs? Y Y  Dressing or bathing? Y Y  Doing errands, shopping? Tempie Donning  Preparing Food and eating ? N -  Using the Toilet? Y -  Managing your Medications? Y -    Fall/Depression Screening:    PHQ 2/9 Scores 06/19/2015 05/21/2015  PHQ - 2 Score 0 0    Assessment:    Plan:

## 2015-06-20 ENCOUNTER — Other Ambulatory Visit: Payer: Self-pay

## 2015-07-01 ENCOUNTER — Other Ambulatory Visit: Payer: Self-pay | Admitting: Family Medicine

## 2015-07-01 DIAGNOSIS — F32A Depression, unspecified: Secondary | ICD-10-CM

## 2015-07-01 DIAGNOSIS — F329 Major depressive disorder, single episode, unspecified: Secondary | ICD-10-CM

## 2015-07-01 DIAGNOSIS — I10 Essential (primary) hypertension: Secondary | ICD-10-CM

## 2015-07-09 ENCOUNTER — Other Ambulatory Visit: Payer: Self-pay

## 2015-07-09 DIAGNOSIS — R21 Rash and other nonspecific skin eruption: Secondary | ICD-10-CM

## 2015-07-09 MED ORDER — TRIAMCINOLONE ACETONIDE 0.5 % EX CREA: 1.0000 "application " | TOPICAL_CREAM | Freq: Three times a day (TID) | CUTANEOUS | Status: DC

## 2015-07-09 MED ORDER — CEPHALEXIN 500 MG PO CAPS: 500.0000 mg | ORAL_CAPSULE | Freq: Four times a day (QID) | ORAL | Status: DC

## 2015-07-17 ENCOUNTER — Other Ambulatory Visit: Payer: Self-pay

## 2015-07-17 DIAGNOSIS — E1165 Type 2 diabetes mellitus with hyperglycemia: Secondary | ICD-10-CM

## 2015-07-17 DIAGNOSIS — IMO0002 Reserved for concepts with insufficient information to code with codable children: Secondary | ICD-10-CM

## 2015-07-17 NOTE — Patient Outreach (Signed)
[facility]) Care Management  07/17/2015  Maria Singh Jun 09, 1951 [id number]  Spoke with patient by phone today- blood sugars remain the same 160-180mg /dl fasting (average 175mg /dl for 30 days). The lowest has been 140mg /dl and the highest was this am at 210mg /dl.  She has not seen an improvement since our last visit but reports that she has not been writing down her food, " I know it would help me".  Denies low blood sugars. She said when she's checked her blood sugars before lunch, they're lower than in the morning.   Reports that her cat jumped down from a stool and onto her left foot leaving a scratch.  She developed cellulitis and required antibiotics.  The  Area is now healed and the redness has subsided.   Reviewed the need for protein and carbs at all meals and snacks- nuts, deli meat, eggs, cheese.   Follow up in approximately 1 month.   Gentry Fitz, RN, BA, [place], [place] Direct Dial:  [phone]  Fax:  [phone] E-mail: [name] [address] [place], [place]  [address]

## 2015-07-19 ENCOUNTER — Other Ambulatory Visit: Payer: Self-pay | Admitting: Family Medicine

## 2015-07-19 DIAGNOSIS — K219 Gastro-esophageal reflux disease without esophagitis: Secondary | ICD-10-CM

## 2015-08-28 ENCOUNTER — Other Ambulatory Visit: Payer: Self-pay

## 2015-08-28 DIAGNOSIS — E1165 Type 2 diabetes mellitus with hyperglycemia: Secondary | ICD-10-CM

## 2015-08-28 DIAGNOSIS — Z794 Long term (current) use of insulin: Principal | ICD-10-CM

## 2015-08-28 NOTE — Patient Outreach (Signed)
[facility]) Care Management  [place]  08/28/2015   Maria Singh 08-01-1951 [id number]  Subjective: Spoke to patient by phone today.  She hurt her right shoulder when she tried to help move her wheelchair in her [name].  It happened about a month ago and although it's not hurting now, it is giving her pain if she lays on it poorly and she has limited ability to lift heavy things now.  She reports that her fasting blood sugars are generally 180mg /dl most mornings- she is having more blood sugars greater than 200mg /dl now.     Current Medications:  Current Outpatient Prescriptions  Medication Sig Dispense Refill  . aspirin 81 MG tablet Take 81 mg by mouth daily.    Marland Kitchen docusate sodium (COLACE) 100 MG capsule Take 100 mg by mouth daily.     Marland Kitchen EPINEPHrine 0.3 mg/0.3 mL IJ SOAJ injection Inject 0.3 mLs (0.3 mg total) into the muscle once. 1 Device 5  . insulin aspart (NOVOLOG) 100 UNIT/ML injection Inject 20 Units into the skin 3 (three) times daily before meals.    . insulin detemir (LEVEMIR) 100 UNIT/ML injection Inject 45 Units into the skin 2 (two) times daily.    . Liraglutide 18 MG/3ML SOPN Inject 1.8 mLs (10.8 mg total) into the skin 1 day or 1 dose. 6 mL 5  . losartan (COZAAR) 25 MG tablet Take 25 mg by mouth daily.    . metFORMIN (GLUCOPHAGE) 500 MG tablet Take 500 mg by mouth every morning.    . metFORMIN (GLUCOPHAGE) 500 MG tablet Take 1,000 mg by mouth every evening.    Marland Kitchen NOVOFINE 32G X 6 MM MISC USE AS DIRECTED 3 TIMES A DAY 300 each 3  . NOVOLOG FLEXPEN 100 UNIT/ML FlexPen INJECT 20 UNITS SUBCUTANEOUSLY THREE TIMES A DAY 60 mL 3  . pantoprazole (PROTONIX) 40 MG tablet TAKE 1 TABLET BY MOUTH ONCE DAILY 90 tablet 1  . rosuvastatin (CRESTOR) 5 MG tablet Take 5 mg by mouth daily.    . sertraline (ZOLOFT) 100 MG tablet TAKE 1 TABLET BY MOUTH ONCE DAILY 90 tablet 1  . triamcinolone cream (KENALOG) 0.5 % Apply 1 application topically 3 (three) times daily. 60  g 0  . cephALEXin (KEFLEX) 500 MG capsule Take 1 capsule (500 mg total) by mouth 4 (four) times daily. (Patient not taking: Reported on 08/28/2015) 28 capsule 0  . ciprofloxacin (CIPRO) 500 MG tablet Take 1 tablet (500 mg total) by mouth 2 (two) times daily. (Patient not taking: Reported on 06/19/2015) 20 tablet 0  . losartan (COZAAR) 50 MG tablet TAKE 1 TABLET BY MOUTH ONCE DAILY (Patient not taking: Reported on 08/28/2015) 90 tablet 1  . metFORMIN (GLUCOPHAGE) 1000 MG tablet TAKE 1 TABLET BY MOUTH TWICE DAILY (Patient not taking: Reported on 06/19/2015) 180 tablet 1  . metroNIDAZOLE (FLAGYL) 500 MG tablet Take 1 tablet (500 mg total) by mouth 2 (two) times daily. (Patient not taking: Reported on 06/19/2015) 20 tablet 0  . mupirocin ointment (BACTROBAN) 2 % APPLY TO AFFECTED AREA(S) AS NEEDED (Patient not taking: Reported on 08/28/2015) 22 g 1  . VICTOZA 18 MG/3ML SOPN INJECT 1.8MG  SUBCUTANEOUSLY DAILY (Patient not taking: Reported on 06/19/2015) 27 mL 0   No current facility-administered medications for this visit.    Functional Status:  In your present state of health, do you have any difficulty performing the following activities: 08/28/2015 06/19/2015  Hearing? N Y  Vision? Y N  Difficulty concentrating or  making decisions? N N  Walking or climbing stairs? Y Y  Dressing or bathing? Y Y  Doing errands, shopping? Tempie Donning  Conservation officer, nature and eating ? - N  Using the Toilet? - Y  Managing your Medications? - Y    Fall/Depression Screening: PHQ 2/9 Scores 08/28/2015 06/19/2015 05/21/2015  PHQ - 2 Score 0 0 0    Plan: Patient needs preventative tests; pneumonia and flu vaccine, an A1C, her dental and dilated eye exam.   Patient will speak to her physician about her fasting blood sugars- consider increasing evening Levemir to improve fasting blood sugars.   I have suggested Enya eat a bedtime snack- she reports that fasting blood sugars are worse when she doesn't eat a bedtime snack or has few carbs at  her evening meal.   Follow up by phone in approximately 1 month.    Gentry Fitz, RN, BA, [place], [place] Direct Dial:  [phone]  Fax:  [phone] E-mail: [name] [address] [place], [place]  [address]

## 2015-09-02 ENCOUNTER — Other Ambulatory Visit: Payer: Self-pay | Admitting: Family Medicine

## 2015-09-25 ENCOUNTER — Other Ambulatory Visit: Payer: Self-pay

## 2015-09-25 DIAGNOSIS — E1165 Type 2 diabetes mellitus with hyperglycemia: Secondary | ICD-10-CM

## 2015-09-25 DIAGNOSIS — Z794 Long term (current) use of insulin: Principal | ICD-10-CM

## 2015-09-25 NOTE — Patient Outreach (Signed)
[place] [facility]) Care Management  09/25/2015  Maria Singh 06/20/1951 [id number]   Spoke to patient by phone today- she reports less pain in her right shoulder compared to when I spoke to her last month and is finding it more comfortable to lay in bed and get a good night's rest.   She increased her Levemir to 55units qpm, and continues 45 units qam, Metformin 500mg  qam, 1000mg  qpm, Victoza 1.8 mg daily and Novolog 20 units tid with meals.  Her fasting blood sugars have improved and are generally 150-160mg /dl (compared to 200's) and her 30 day average is 167mg /dl.  She is having occasional fasting blood sugars in the 200's but much more rare now.    Patient needs preventative tests; pneumonia, an A1C, her dental and dilated eye exam. She had her flu vaccine.  I have suggested Maria Singh eat a bedtime snack- she reports that fasting blood sugars are worse when she doesn't eat a bedtime snack or has few carbs at her evening meal. She has been trying to eat her bedtime snack and does find it helpful to lower numbers.   Follow up by phone in approximately 1 month- January 18th.   Gentry Fitz, RN, BA, [place], [place] Direct Dial:  [phone]  Fax:  [phone] E-mail: [name] [address], [place], [place]  [address]

## 2015-11-05 DIAGNOSIS — F4320 Adjustment disorder, unspecified: Secondary | ICD-10-CM | POA: Diagnosis not present

## 2015-11-07 ENCOUNTER — Telehealth: Payer: Self-pay

## 2015-11-19 DIAGNOSIS — F4320 Adjustment disorder, unspecified: Secondary | ICD-10-CM | POA: Diagnosis not present

## 2015-11-26 ENCOUNTER — Other Ambulatory Visit: Payer: Self-pay | Admitting: Family Medicine

## 2015-11-28 ENCOUNTER — Other Ambulatory Visit: Payer: Self-pay

## 2015-11-28 NOTE — Patient Outreach (Signed)
[place] [facility]) Care Management  11/28/2015  Maria Singh [birth year] [id number]   I have called [place] to schedule an appointment. I have left a voice message. Of note- Dr. Adah Salvage note indicates the patient needs a follow up with her as well.  I have left a voice message for patient to schedule both an appointment with the doctor and with me.    Gentry Fitz, RN, BA, [place], [place] Direct Dial:  [phone]  Fax:  [phone] E-mail: [name] [address] [place], [place]  [address]

## 2015-11-29 ENCOUNTER — Other Ambulatory Visit: Payer: Self-pay

## 2015-12-02 ENCOUNTER — Other Ambulatory Visit: Payer: 59

## 2015-12-02 DIAGNOSIS — E109 Type 1 diabetes mellitus without complications: Secondary | ICD-10-CM | POA: Diagnosis not present

## 2015-12-02 DIAGNOSIS — I10 Essential (primary) hypertension: Secondary | ICD-10-CM | POA: Diagnosis not present

## 2015-12-02 DIAGNOSIS — R69 Illness, unspecified: Secondary | ICD-10-CM

## 2015-12-02 DIAGNOSIS — E785 Hyperlipidemia, unspecified: Secondary | ICD-10-CM

## 2015-12-03 DIAGNOSIS — F4320 Adjustment disorder, unspecified: Secondary | ICD-10-CM | POA: Diagnosis not present

## 2015-12-03 LAB — RENAL FUNCTION PANEL
ALBUMIN: 4 g/dL (ref 3.6–4.8)
BUN / CREAT RATIO: 28 — AB (ref 11–26)
BUN: 24 mg/dL (ref 8–27)
CALCIUM: 9 mg/dL (ref 8.7–10.3)
CHLORIDE: 102 mmol/L (ref 96–106)
CO2: 20 mmol/L (ref 18–29)
Creatinine, Ser: 0.86 mg/dL (ref 0.57–1.00)
GFR calc Af Amer: 83 mL/min/{1.73_m2} (ref >59)
GFR calc non Af Amer: 72 mL/min/{1.73_m2} (ref >59)
Glucose: 129 mg/dL — ABNORMAL HIGH (ref 65–99)
Phosphorus: 4.9 mg/dL — ABNORMAL HIGH (ref 2.5–4.5)
Potassium: 5.7 mmol/L — ABNORMAL HIGH (ref 3.5–5.2)
Sodium: 139 mmol/L (ref 134–144)

## 2015-12-03 LAB — LIPID PANEL
CHOLESTEROL TOTAL: 125 mg/dL (ref 100–199)
Chol/HDL Ratio: 4.2 ratio units (ref 0.0–4.4)
HDL: 30 mg/dL — ABNORMAL LOW (ref >39)
LDL Calculated: 52 mg/dL (ref 0–99)
Triglycerides: 217 mg/dL — ABNORMAL HIGH (ref 0–149)
VLDL CHOLESTEROL CAL: 43 mg/dL — AB (ref 5–40)

## 2015-12-03 LAB — HEMOGLOBIN A1C
Est. average glucose Bld gHb Est-mCnc: 177 mg/dL
Hgb A1c MFr Bld: 7.8 % — ABNORMAL HIGH (ref 4.8–5.6)

## 2015-12-03 LAB — HEPATIC FUNCTION PANEL
ALT: 32 IU/L (ref 0–32)
AST: 32 IU/L (ref 0–40)
Alkaline Phosphatase: 33 IU/L — ABNORMAL LOW (ref 39–117)
Bilirubin, Direct: 0.08 mg/dL (ref 0.00–0.40)
Total Protein: 6.5 g/dL (ref 6.0–8.5)

## 2015-12-17 DIAGNOSIS — F4320 Adjustment disorder, unspecified: Secondary | ICD-10-CM | POA: Diagnosis not present

## 2015-12-27 ENCOUNTER — Other Ambulatory Visit: Payer: Self-pay

## 2015-12-27 MED ORDER — DICLOFENAC SODIUM 1 % TD GEL: TRANSDERMAL | Status: DC

## 2015-12-31 DIAGNOSIS — F4320 Adjustment disorder, unspecified: Secondary | ICD-10-CM | POA: Diagnosis not present

## 2016-01-01 ENCOUNTER — Other Ambulatory Visit: Payer: Self-pay | Admitting: Family Medicine

## 2016-01-02 ENCOUNTER — Other Ambulatory Visit: Payer: Self-pay

## 2016-01-07 ENCOUNTER — Other Ambulatory Visit: Payer: Self-pay

## 2016-01-07 MED ORDER — CLINDAMYCIN HCL 300 MG PO CAPS: 300.0000 mg | ORAL_CAPSULE | Freq: Three times a day (TID) | ORAL | Status: DC

## 2016-01-14 DIAGNOSIS — F4320 Adjustment disorder, unspecified: Secondary | ICD-10-CM | POA: Diagnosis not present

## 2016-01-21 ENCOUNTER — Other Ambulatory Visit: Payer: Self-pay

## 2016-01-21 NOTE — Patient Outreach (Signed)
[facility]) Care Management  01/21/2016  Maria Singh 07/22/1951 [id number]  I have called [name] to set up an appointment with me, there was no answer.  I have left a voice message to call me to schedule.   Gentry Fitz, RN, BA, [place], [place] Direct Dial:  [phone]  Fax:  [phone] E-mail: [name] [address] [place], [place]  [address]

## 2016-02-03 DIAGNOSIS — IMO0001 Reserved for inherently not codable concepts without codable children: Secondary | ICD-10-CM

## 2016-02-03 DIAGNOSIS — Z794 Long term (current) use of insulin: Principal | ICD-10-CM

## 2016-02-03 DIAGNOSIS — E1165 Type 2 diabetes mellitus with hyperglycemia: Principal | ICD-10-CM

## 2016-02-11 DIAGNOSIS — F4320 Adjustment disorder, unspecified: Secondary | ICD-10-CM | POA: Diagnosis not present

## 2016-02-25 DIAGNOSIS — F4320 Adjustment disorder, unspecified: Secondary | ICD-10-CM | POA: Diagnosis not present

## 2016-02-26 ENCOUNTER — Ambulatory Visit: Payer: Self-pay

## 2016-02-26 ENCOUNTER — Other Ambulatory Visit: Payer: Self-pay

## 2016-02-26 DIAGNOSIS — E1165 Type 2 diabetes mellitus with hyperglycemia: Secondary | ICD-10-CM

## 2016-02-26 DIAGNOSIS — Z794 Long term (current) use of insulin: Principal | ICD-10-CM

## 2016-02-26 NOTE — Patient Outreach (Signed)
[place] [facility]) Care Management  02/26/2016  Maria Singh 1951/10/18 [id number]   I spoke to [name] by phone today.  She complains of increased right shoulder pain especially when she moves it- she even complains of it hurting when she sleeps in a certain position.  This is worse over the last few months- she re-injured it when she moved her pillow in bed. She reports that she probably needs to see an orthopedic MD now to assess the injury.   She reports last A1C as 7.8% (February 2017) - she decreased her Levemir insulin from 45units qam and 55units qpm to 35units qam and 45units qpm when she has some severe abdominal pain in March and was not taking in as much food(she thinks it could be an intolerance to lactose).  She continues to take Novolog 20 units tid before meals and sometimes with a heavy hs snack- she denies hypoglycemia but had a blood sugar greater than 200 mg/dl over the Easter holiday.  Her average blood sugars are 161mg /dl (90 days), 174mg /dl (30 days), 165mg /dl (14 days) and 167mg /dl (7 days). She generally only checks blood sugars before meals.  She likely needs to increase her Levemir 1 unit/day until fasting blood sugars are less than 140mg /dl- she should discuss with MD.   She hit her right foot big toe and lost her toe nail.  It had to be excised but now has no oozing or open areas.  The new nail is growing back.  She checks her feet daily using a mirror.   Plan; [facility] CM Care Plan Problem One        Most Recent Value   [facility] Long Term Goal (31-90 days)  patient will complete their dental and dilated eye, needs A1C,  flu and pneumonia vacine by December 2017   [facility] Long Term Goal Start Date  02/26/16     She will contact me to make her next visit- due in late June.   Gentry Fitz, RN, BA, [place], [place] Direct Dial:  [phone]  Fax:  [phone] E-mail:  [name] [address] [place], [place]  [address]

## 2016-03-01 DIAGNOSIS — F4320 Adjustment disorder, unspecified: Secondary | ICD-10-CM | POA: Diagnosis not present

## 2016-03-23 ENCOUNTER — Other Ambulatory Visit: Payer: Self-pay | Admitting: Family Medicine

## 2016-03-24 DIAGNOSIS — F4320 Adjustment disorder, unspecified: Secondary | ICD-10-CM | POA: Diagnosis not present

## 2016-04-03 ENCOUNTER — Other Ambulatory Visit: Payer: Self-pay | Admitting: Family Medicine

## 2016-04-07 DIAGNOSIS — F4320 Adjustment disorder, unspecified: Secondary | ICD-10-CM | POA: Diagnosis not present

## 2016-04-15 ENCOUNTER — Other Ambulatory Visit: Payer: Self-pay

## 2016-04-15 NOTE — Patient Outreach (Signed)
[place] [facility]) Care Management  04/15/2016  Maria Singh Mar 01, 1951 [id number]  I have called [name] to discuss blood sugar control- she did not answer therefore I have left a message.  I had asked her to call and schedule an appointment for late June but have not heard from her , so I followed up myself.   Gentry Fitz, RN, BA, [place], [place] Direct Dial:  [phone]  Fax:  [phone] E-mail: [name] [address], [place], [place]  [address]

## 2016-04-15 NOTE — Patient Outreach (Signed)
[place] [facility]) Care Management  04/15/2016  Maria Singh 02-14-1951 [id number]   Spoke to [place] by phone today.  She reports a slight improvement in blood sugars since she increased her Levemir to 40 units qam and 50 units qpm.  She reports 7 day average=151mg /dl, 14 day average 160mg /dl and 30 day average 164mg /dl. She reports no hypoglycemia.   She continues to take Novolog 20 units tid with meals, Victoza and Metformin.  She has not had a recent A1C.   She reports no pain in her shoulder now ; she does complain of weakness and an inability to lift it very high. I have strongly suggested she have it examined- it is limiting her ability to do some activities and may limit her independence.    [facility] CM Care Plan Problem One        Most Recent Value   Care Plan Problem One  Patient is missing preventative appointments   Role Documenting the Problem One  Care Management Coordinator   [facility] Long Term Goal (31-90 days)  patient will complete their dental and dilated eye, needs A1C,  flu and pneumonia vacine by December 2017   [facility] Long Term Goal Start Date  02/26/16     She has scheduled her dilated eye exam for July 19th. We reviewed her goals.   Gentry Fitz, RN, BA, MHA, CDE Diabetes Coordinator Inpatient Diabetes Program  [phone] (Team Pager) [phone] ([place]) 04/15/2016 3:40 PM

## 2016-05-01 ENCOUNTER — Other Ambulatory Visit: Payer: Self-pay | Admitting: Family Medicine

## 2016-05-05 DIAGNOSIS — F4320 Adjustment disorder, unspecified: Secondary | ICD-10-CM | POA: Diagnosis not present

## 2016-05-11 DIAGNOSIS — H5213 Myopia, bilateral: Secondary | ICD-10-CM | POA: Diagnosis not present

## 2016-05-13 ENCOUNTER — Other Ambulatory Visit: Payer: Self-pay

## 2016-05-13 NOTE — Patient Outreach (Signed)
[facility] [facility]) Care Management  05/13/2016  Larose Cansas Ell [birth year] [id number]   I called this patient at our scheduled Link to Wellness time- she did not answer- I have left her a voice message to return my call.   Gentry Fitz, RN, BA, [place], [place] Direct Dial:  [phone]  Fax:  [phone] E-mail: [name] [address], [place], [place]  [address]

## 2016-05-19 DIAGNOSIS — F4320 Adjustment disorder, unspecified: Secondary | ICD-10-CM | POA: Diagnosis not present

## 2016-05-20 ENCOUNTER — Other Ambulatory Visit: Payer: Self-pay

## 2016-05-25 DIAGNOSIS — F4320 Adjustment disorder, unspecified: Secondary | ICD-10-CM | POA: Diagnosis not present

## 2016-05-28 ENCOUNTER — Other Ambulatory Visit: Payer: Self-pay

## 2016-05-28 ENCOUNTER — Other Ambulatory Visit: Payer: Self-pay | Admitting: Family Medicine

## 2016-05-28 DIAGNOSIS — E785 Hyperlipidemia, unspecified: Secondary | ICD-10-CM

## 2016-05-28 DIAGNOSIS — Z79899 Other long term (current) drug therapy: Secondary | ICD-10-CM | POA: Diagnosis not present

## 2016-05-28 DIAGNOSIS — I10 Essential (primary) hypertension: Secondary | ICD-10-CM

## 2016-05-28 DIAGNOSIS — E109 Type 1 diabetes mellitus without complications: Secondary | ICD-10-CM

## 2016-05-29 LAB — RENAL FUNCTION PANEL
ALBUMIN: 4.1 g/dL (ref 3.6–4.8)
BUN/Creatinine Ratio: 27 (ref 12–28)
BUN: 22 mg/dL (ref 8–27)
CHLORIDE: 102 mmol/L (ref 96–106)
CO2: 18 mmol/L (ref 18–29)
CREATININE: 0.83 mg/dL (ref 0.57–1.00)
Calcium: 9 mg/dL (ref 8.7–10.3)
GFR calc non Af Amer: 75 mL/min/{1.73_m2} (ref >59)
GFR, EST AFRICAN AMERICAN: 86 mL/min/{1.73_m2} (ref >59)
Glucose: 146 mg/dL — ABNORMAL HIGH (ref 65–99)
Phosphorus: 4.9 mg/dL — ABNORMAL HIGH (ref 2.5–4.5)
Potassium: 5.5 mmol/L — ABNORMAL HIGH (ref 3.5–5.2)
Sodium: 140 mmol/L (ref 134–144)

## 2016-05-29 LAB — HEMOGLOBIN A1C
ESTIMATED AVERAGE GLUCOSE: 157 mg/dL
Hgb A1c MFr Bld: 7.1 % — ABNORMAL HIGH (ref 4.8–5.6)

## 2016-05-29 LAB — LIPID PANEL
Chol/HDL Ratio: 4.9 ratio units — ABNORMAL HIGH (ref 0.0–4.4)
Cholesterol, Total: 133 mg/dL (ref 100–199)
HDL: 27 mg/dL — AB (ref >39)
LDL Calculated: 57 mg/dL (ref 0–99)
Triglycerides: 245 mg/dL — ABNORMAL HIGH (ref 0–149)
VLDL Cholesterol Cal: 49 mg/dL — ABNORMAL HIGH (ref 5–40)

## 2016-05-29 LAB — HEPATIC FUNCTION PANEL
ALT: 27 IU/L (ref 0–32)
AST: 28 IU/L (ref 0–40)
Alkaline Phosphatase: 31 IU/L — ABNORMAL LOW (ref 39–117)
Bilirubin Total: 0.2 mg/dL (ref 0.0–1.2)
Bilirubin, Direct: 0.08 mg/dL (ref 0.00–0.40)
TOTAL PROTEIN: 7 g/dL (ref 6.0–8.5)

## 2016-06-01 NOTE — Patient Outreach (Signed)
[place] [facility]) Care Management  06/01/2016  Maria Singh 10-13-1951 [id number]   Missed appointment letter sent to patient.   Gentry Fitz, RN, BA, [place], [place] Direct Dial:  [phone]  Fax:  [phone] E-mail: [name] [address] [place], [place]  [address]

## 2016-06-07 DIAGNOSIS — F4320 Adjustment disorder, unspecified: Secondary | ICD-10-CM | POA: Diagnosis not present

## 2016-06-17 ENCOUNTER — Other Ambulatory Visit: Payer: Self-pay | Admitting: Family Medicine

## 2016-06-17 DIAGNOSIS — E119 Type 2 diabetes mellitus without complications: Secondary | ICD-10-CM

## 2016-06-29 ENCOUNTER — Other Ambulatory Visit: Payer: Self-pay

## 2016-06-30 DIAGNOSIS — F4320 Adjustment disorder, unspecified: Secondary | ICD-10-CM | POA: Diagnosis not present

## 2016-07-01 ENCOUNTER — Other Ambulatory Visit: Payer: Self-pay

## 2016-07-01 DIAGNOSIS — E1165 Type 2 diabetes mellitus with hyperglycemia: Secondary | ICD-10-CM

## 2016-07-01 DIAGNOSIS — Z794 Long term (current) use of insulin: Principal | ICD-10-CM

## 2016-07-01 NOTE — Patient Outreach (Signed)
[place] [facility]) Care Management  [place]  07/01/2016   Eddy Jairam [birth year] [id number]  Subjective: Spoke to patient by phone today.  She had her annual lab draw on 05/28/16- A1C 7.1%- ideal.  She was discouraged when she was told of the result because she was told to increase her insulins; she is currently taking Novolog 22 units tid or qid if she eats a large snack, Victoza 1.8mg /day, Metformin 500mg  qam, 1000mg  qpm and Levemir 48 units qam, 58 units qpm. She denies low blood sugars. Recent CBG elevated because she went to [facility]- prior to this the numbers were better than when we spoke last time. She has had a dialeted eye exam and will schedule her flu shot, pneumonia vaccine and her mammogram.  She also needs a dental exam.    Encounter Medications:  Outpatient Encounter Prescriptions as of 07/01/2016  Medication Sig  . insulin aspart (NOVOLOG) 100 UNIT/ML injection Inject 22 Units into the skin 3 (three) times daily before meals.   . insulin detemir (LEVEMIR) 100 UNIT/ML injection Inject 48-58 Units into the skin 2 (two) times daily. Takes 48 units qam, 58 units qpm  . metFORMIN (GLUCOPHAGE) 500 MG tablet Take 1,000 mg by mouth every evening.  Marland Kitchen VICTOZA 18 MG/3ML SOPN INJECT 1.8 MLS (10.8 MG TOTAL) INTO THE SKIN ONCE DAILY  . aspirin 81 MG tablet Take 81 mg by mouth daily.  . cephALEXin (KEFLEX) 500 MG capsule Take 1 capsule (500 mg total) by mouth 4 (four) times daily. (Patient not taking: Reported on 07/01/2016)  . ciprofloxacin (CIPRO) 500 MG tablet Take 1 tablet (500 mg total) by mouth 2 (two) times daily. (Patient not taking: Reported on 07/01/2016)  . clindamycin (CLEOCIN) 300 MG capsule Take 1 capsule (300 mg total) by mouth 3 (three) times daily. (Patient not taking: Reported on 07/01/2016)  . diclofenac sodium (VOLTAREN) 1 % GEL Apply to area BID PRN  . docusate sodium (COLACE) 100 MG capsule Take 100 mg by mouth daily.   Marland Kitchen EPINEPHrine 0.3 mg/0.3  mL IJ SOAJ injection Inject 0.3 mLs (0.3 mg total) into the muscle once.  Marland Kitchen glucose blood (BAYER CONTOUR NEXT TEST) test strip One test qday  . ibuprofen (ADVIL,MOTRIN) 800 MG tablet TAKE ONE TABLET BY MOUTH THREE TIMES A DAY (Patient taking differently: TAKE ONE TABLET BY MOUTH THREE TIMES A DAY prn)  . LEVEMIR FLEXTOUCH 100 UNIT/ML Pen INJECT 70 UNITS SUBCUTANEOUS TWO TIMES DAILY (Patient not taking: Reported on 04/15/2016)  . losartan (COZAAR) 25 MG tablet Take 25 mg by mouth daily. Reported on 02/26/2016  . losartan (COZAAR) 50 MG tablet TAKE 1 TABLET BY MOUTH ONCE DAILY  . metFORMIN (GLUCOPHAGE) 1000 MG tablet TAKE 1 TABLET BY MOUTH TWICE DAILY  . metFORMIN (GLUCOPHAGE) 500 MG tablet Take 500 mg by mouth every morning.  . metroNIDAZOLE (FLAGYL) 500 MG tablet Take 1 tablet (500 mg total) by mouth 2 (two) times daily. (Patient not taking: Reported on 07/01/2016)  . mupirocin ointment (BACTROBAN) 2 % APPLY TO AFFECTED AREA(S) AS NEEDED  . NOVOFINE 32G X 6 MM MISC USE AS DIRECTED 3 TIMES A DAY  . NOVOLOG FLEXPEN 100 UNIT/ML FlexPen INJECT 20 UNITS SUBCUTANEOUSLY THREE TIMES A DAY (Patient not taking: Reported on 07/01/2016)  . pantoprazole (PROTONIX) 40 MG tablet TAKE 1 TABLET BY MOUTH ONCE DAILY  . pantoprazole (PROTONIX) 40 MG tablet TAKE 1 TABLET BY MOUTH ONCE DAILY  . rosuvastatin (CRESTOR) 5 MG tablet Take 5 mg by  mouth daily.  . sertraline (ZOLOFT) 100 MG tablet TAKE 1 TABLET BY MOUTH ONCE DAILY  . triamcinolone cream (KENALOG) 0.5 % Apply 1 application topically 3 (three) times daily. (Patient not taking: Reported on 09/25/2015)  . VICTOZA 18 MG/3ML SOPN INJECT 1.8MG  SUBCUTANEOUSLY DAILY (Patient not taking: Reported on 07/01/2016)   No facility-administered encounter medications on file as of 07/01/2016.     Functional Status:  In your present state of health, do you have any difficulty performing the following activities: 02/26/2016 08/28/2015  Hearing? Y N  Vision? Y Y  Difficulty  concentrating or making decisions? N N  Walking or climbing stairs? Y Y  Dressing or bathing? Y Y  Doing errands, shopping? Tempie Donning  Some recent data might be hidden    Fall/Depression Screening: PHQ 2/9 Scores 07/01/2016 02/26/2016 08/28/2015 06/19/2015 05/21/2015  PHQ - 2 Score 0 0 0 0 0    Assessment: Reviewed goals for A1C and the need for preventative exams.  Encouraged smaller meals as this will also help with CBG control and will help [place] delay the need for more insulin- patient very receptive.   Plan:  [facility] CM Care Plan Problem One   Flowsheet Row Most Recent Value  Care Plan Problem One  Patient is missing preventative appointments  Role Documenting the Problem One  Care Management Coordinator  Care Plan for Problem One  Active  [facility] Long Term Goal (31-90 days)  patient will complete their dental and dilated eye, needs A1C,  flu and pneumonia vacine by December 2017  [facility] Long Term Goal Start Date  07/01/16  Interventions for Problem One Long Term Goal  Reviewed the importance of ADA A1C goals, ADA recommendations for preventative tests/ ongoing monitoring     I will follow up with Shawndra in 2 weeks.   Gentry Fitz, RN, BA, [place], [place] Direct Dial:  [phone]  Fax:  [phone] E-mail: [name] [address] [place],   [address]

## 2016-07-03 ENCOUNTER — Other Ambulatory Visit: Payer: Self-pay | Admitting: Family Medicine

## 2016-07-14 DIAGNOSIS — F4320 Adjustment disorder, unspecified: Secondary | ICD-10-CM | POA: Diagnosis not present

## 2016-08-03 ENCOUNTER — Other Ambulatory Visit: Payer: Self-pay | Admitting: Family Medicine

## 2016-08-05 ENCOUNTER — Other Ambulatory Visit: Payer: Self-pay

## 2016-08-05 NOTE — Patient Outreach (Signed)
[facility]) Care Management  [place]  08/05/2016   Maria Singh 1951/07/20 [id number]  Subjective:  Spoke to patient by phone today.  She is currently taking Novolog 22 units tid or qid if she eats a large snack, Victoza 1.8mg /day, Metformin 500mg  qam, 1000mg  qpm and Levemir 45 units qam, 55 units qpm.  She dropped this slightly from 48 units and 58 units because her blood sugars were getting too low 84mg /dl) She denies low blood sugars. She has had a dilated eye exam and her flu shot, she still needs a pneumonia vaccine and her mammogram.  She also needs a dental exam.   7 day average 123mg /dl, 14 day average 126mg /dl, 30 day average 111mg /dl and 90 day average 144mg /dl.   Encounter Medications:  Outpatient Encounter Prescriptions as of 08/05/2016  Medication Sig  . aspirin 81 MG tablet Take 81 mg by mouth daily.  . diclofenac sodium (VOLTAREN) 1 % GEL Apply to area BID PRN  . docusate sodium (COLACE) 100 MG capsule Take 100 mg by mouth daily.   Marland Kitchen EPINEPHrine 0.3 mg/0.3 mL IJ SOAJ injection Inject 0.3 mLs (0.3 mg total) into the muscle once.  Marland Kitchen glucose blood (BAYER CONTOUR NEXT TEST) test strip One test qday  . ibuprofen (ADVIL,MOTRIN) 800 MG tablet TAKE ONE TABLET BY MOUTH THREE TIMES A DAY  . insulin aspart (NOVOLOG) 100 UNIT/ML injection Inject 22 Units into the skin 3 (three) times daily before meals.   . insulin detemir (LEVEMIR) 100 UNIT/ML injection Inject 48-58 Units into the skin 2 (two) times daily. Takes 48 units qam, 58 units qpm- is currently taking 45 units qam, 55 q pm  . losartan (COZAAR) 25 MG tablet Take 25 mg by mouth daily. Reported on 02/26/2016  . metFORMIN (GLUCOPHAGE) 500 MG tablet Take 500 mg by mouth every morning.  . metFORMIN (GLUCOPHAGE) 500 MG tablet Take 1,000 mg by mouth every evening.  . mupirocin ointment (BACTROBAN) 2 % APPLY TO AFFECTED AREA(S) AS NEEDED  . NOVOFINE 32G X 6 MM MISC USE AS DIRECTED 3 TIMES A DAY  .  NOVOLOG FLEXPEN 100 UNIT/ML FlexPen INJECT 20 UNITS SUBCUTANEOUSLY THREE TIMES A DAY (Patient taking differently: INJECT 22 UNITS SUBCUTANEOUSLY THREE TIMES A DAY)  . pantoprazole (PROTONIX) 40 MG tablet TAKE 1 TABLET BY MOUTH ONCE DAILY  . rosuvastatin (CRESTOR) 5 MG tablet Take 5 mg by mouth daily.  . sertraline (ZOLOFT) 100 MG tablet TAKE 1 TABLET BY MOUTH ONCE DAILY  . VICTOZA 18 MG/3ML SOPN INJECT 1.8MG  SUBCUTANEOUSLY DAILY  . cephALEXin (KEFLEX) 500 MG capsule Take 1 capsule (500 mg total) by mouth 4 (four) times daily. (Patient not taking: Reported on 08/05/2016)  . ciprofloxacin (CIPRO) 500 MG tablet Take 1 tablet (500 mg total) by mouth 2 (two) times daily. (Patient not taking: Reported on 08/05/2016)  . clindamycin (CLEOCIN) 300 MG capsule Take 1 capsule (300 mg total) by mouth 3 (three) times daily. (Patient not taking: Reported on 08/05/2016)  . LEVEMIR FLEXTOUCH 100 UNIT/ML Pen INJECT 70 UNITS SUBCUTANEOUS TWO TIMES DAILY (Patient not taking: Reported on 08/05/2016)  . losartan (COZAAR) 50 MG tablet TAKE 1 TABLET BY MOUTH ONCE DAILY  . metFORMIN (GLUCOPHAGE) 1000 MG tablet TAKE 1 TABLET BY MOUTH TWICE DAILY (Patient not taking: Reported on 08/05/2016)  . metroNIDAZOLE (FLAGYL) 500 MG tablet Take 1 tablet (500 mg total) by mouth 2 (two) times daily. (Patient not taking: Reported on 08/05/2016)  . pantoprazole (PROTONIX) 40 MG tablet TAKE  1 TABLET BY MOUTH ONCE DAILY (Patient not taking: Reported on 08/05/2016)  . triamcinolone cream (KENALOG) 0.5 % Apply 1 application topically 3 (three) times daily. (Patient not taking: Reported on 08/05/2016)  . VICTOZA 18 MG/3ML SOPN INJECT 1.8 MLS (10.8 MG TOTAL) INTO THE SKIN ONCE DAILY (Patient not taking: Reported on 08/05/2016)   No facility-administered encounter medications on file as of 08/05/2016.     Functional Status:  In your present state of health, do you have any difficulty performing the following activities: 08/05/2016 02/26/2016   Hearing? Tempie Donning  Vision? N Y  Difficulty concentrating or making decisions? N N  Walking or climbing stairs? Y Y  Dressing or bathing? Y Y  Doing errands, shopping? Tempie Donning  Preparing Food and eating ? N -  Using the Toilet? N -  In the past six months, have you accidently leaked urine? N -  Do you have problems with loss of bowel control? Y -  Managing your Medications? Y -  Housekeeping or managing your Housekeeping? Y -  Some recent data might be hidden    Fall/Depression Screening: PHQ 2/9 Scores 07/01/2016 02/26/2016 08/28/2015 06/19/2015 05/21/2015  PHQ - 2 Score 0 0 0 0 0    Assessment: Patient eating smaller portions, using portion control (1/2 cup ice cream) and eating sweets with a meal to decrease sugar excursions.  Plan: Since Maria Singh sits in a wheelchair, I have asked her to have her spouse do a skin assessment once a week and for her to access under her breasts for irritation since her diabetes and immobility increase her risk for skin breakdown. She is working on completing the rest of her preventative exams before the end of the year. Praised for portion control and medication usage.    Gentry Fitz, RN, BA, [place], [place] Direct Dial:  [phone]  Fax:  [phone] E-mail: [name] [address] [place], [place]  [address]

## 2016-08-10 ENCOUNTER — Other Ambulatory Visit: Payer: Self-pay | Admitting: Family Medicine

## 2016-08-10 DIAGNOSIS — E109 Type 1 diabetes mellitus without complications: Secondary | ICD-10-CM

## 2016-08-14 DIAGNOSIS — F4320 Adjustment disorder, unspecified: Secondary | ICD-10-CM | POA: Diagnosis not present

## 2016-08-25 DIAGNOSIS — F4320 Adjustment disorder, unspecified: Secondary | ICD-10-CM | POA: Diagnosis not present

## 2016-09-07 ENCOUNTER — Other Ambulatory Visit: Payer: Self-pay

## 2016-09-07 DIAGNOSIS — E119 Type 2 diabetes mellitus without complications: Secondary | ICD-10-CM

## 2016-09-07 MED ORDER — CLINDAMYCIN HCL 300 MG PO CAPS: 300.0000 mg | ORAL_CAPSULE | Freq: Three times a day (TID) | ORAL | 0 refills | Status: DC

## 2016-09-07 MED ORDER — INSULIN ASPART 100 UNIT/ML ~~LOC~~ SOLN: 22.0000 [IU] | Freq: Three times a day (TID) | SUBCUTANEOUS | 0 refills | Status: DC

## 2016-09-07 MED ORDER — LIRAGLUTIDE 18 MG/3ML ~~LOC~~ SOPN: PEN_INJECTOR | SUBCUTANEOUS | 5 refills | Status: DC

## 2016-09-22 DIAGNOSIS — F4320 Adjustment disorder, unspecified: Secondary | ICD-10-CM | POA: Diagnosis not present

## 2016-09-25 ENCOUNTER — Other Ambulatory Visit: Payer: Self-pay | Admitting: Family Medicine

## 2016-10-07 ENCOUNTER — Other Ambulatory Visit: Payer: Self-pay

## 2016-10-07 ENCOUNTER — Ambulatory Visit: Payer: Self-pay

## 2016-10-07 ENCOUNTER — Other Ambulatory Visit: Payer: Self-pay | Admitting: Family Medicine

## 2016-10-07 MED ORDER — CLOTRIMAZOLE-BETAMETHASONE 1-0.05 % EX CREA: 1.0000 "application " | TOPICAL_CREAM | Freq: Two times a day (BID) | CUTANEOUS | 0 refills | Status: DC

## 2016-10-07 MED ORDER — NYSTATIN 100000 UNIT/GM EX CREA: 1.0000 "application " | TOPICAL_CREAM | Freq: Two times a day (BID) | CUTANEOUS | 0 refills | Status: DC

## 2016-10-14 ENCOUNTER — Other Ambulatory Visit: Payer: Self-pay

## 2016-10-14 DIAGNOSIS — R609 Edema, unspecified: Secondary | ICD-10-CM

## 2016-10-14 DIAGNOSIS — B379 Candidiasis, unspecified: Secondary | ICD-10-CM

## 2016-10-14 DIAGNOSIS — I999 Unspecified disorder of circulatory system: Secondary | ICD-10-CM

## 2016-10-14 DIAGNOSIS — T3695XA Adverse effect of unspecified systemic antibiotic, initial encounter: Principal | ICD-10-CM

## 2016-10-14 MED ORDER — FLUCONAZOLE 150 MG PO TABS: 150.0000 mg | ORAL_TABLET | Freq: Once | ORAL | 0 refills | Status: AC

## 2016-10-14 MED ORDER — CLINDAMYCIN HCL 300 MG PO CAPS: 300.0000 mg | ORAL_CAPSULE | Freq: Three times a day (TID) | ORAL | 0 refills | Status: DC

## 2016-10-23 ENCOUNTER — Encounter (INDEPENDENT_AMBULATORY_CARE_PROVIDER_SITE_OTHER): Payer: 59 | Admitting: Vascular Surgery

## 2016-10-27 DIAGNOSIS — F4320 Adjustment disorder, unspecified: Secondary | ICD-10-CM | POA: Diagnosis not present

## 2016-10-30 ENCOUNTER — Encounter (INDEPENDENT_AMBULATORY_CARE_PROVIDER_SITE_OTHER): Payer: Self-pay | Admitting: Vascular Surgery

## 2016-10-30 ENCOUNTER — Ambulatory Visit (INDEPENDENT_AMBULATORY_CARE_PROVIDER_SITE_OTHER): Payer: 59 | Admitting: Vascular Surgery

## 2016-10-30 VITALS — BP 148/74 | HR 69 | Resp 16 | Ht 65.0 in | Wt 325.0 lb

## 2016-10-30 DIAGNOSIS — M7989 Other specified soft tissue disorders: Secondary | ICD-10-CM | POA: Diagnosis not present

## 2016-10-30 DIAGNOSIS — E1165 Type 2 diabetes mellitus with hyperglycemia: Secondary | ICD-10-CM

## 2016-10-30 DIAGNOSIS — G600 Hereditary motor and sensory neuropathy: Secondary | ICD-10-CM | POA: Diagnosis not present

## 2016-10-30 DIAGNOSIS — M79605 Pain in left leg: Secondary | ICD-10-CM

## 2016-10-30 DIAGNOSIS — M79609 Pain in unspecified limb: Secondary | ICD-10-CM | POA: Insufficient documentation

## 2016-10-30 DIAGNOSIS — E118 Type 2 diabetes mellitus with unspecified complications: Secondary | ICD-10-CM

## 2016-10-30 NOTE — Assessment & Plan Note (Signed)
Many of her lower extremity symptoms could be secondary to her Charcot-Marie-Tooth disease or diabetic neuropathy, but certainly associated arterial or venous disease could be playing a contributing role.

## 2016-10-30 NOTE — Assessment & Plan Note (Signed)
The patient has diminished mobility with legs are dependent much of the time, and this certainly can create swelling. Nonetheless, significant venous disease Also be present worsening her symptoms and also increasing the swelling and discoloration in the left leg. Elevation and compression stockings which she is artery using seem reasonable as conservative therapies, I would recommend a venous reflux study be done at her convenience in the near future for further evaluation.

## 2016-10-30 NOTE — Progress Notes (Signed)
Patient ID: Maria Singh, female   DOB: Mar 14, 1951, 66 y.o.   MRN: [id number]  Chief Complaint  Patient presents with  . New Patient (Initial Visit)    HPI Maria Singh is a 66 y.o. female.  I am asked to see the patient by Dr. Ronnald Ramp for evaluation of left lower extremity discoloration and swelling.  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 She gets around in a wheelchair and her legs are dependent much of the time. Particularly in the left lower extremity, there is been some dark discoloration of the lower leg associated with worsening swelling. This is also painful. She has had previous workup for neuropathy and had nerve conduction studies previously. She also has a history of diabetes and atherosclerotic risk factors see consideration for vascular disease has an associated factor was being given. She denies nonhealing ulceration or infection. She does not have any fever or chills. Her symptoms have been gradual and their progression of her many months with no clear inciting event or causative factor.   Past Medical History:  Diagnosis Date  . 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 Depression   . Diabetes mellitus without complication ([place])   . GERD (gastroesophageal reflux disease)   . Hyperlipidemia   . Hypertension   . Rotator cuff injury Nov. 2012   left arm  . Sleep apnea     Past Surgical History:  Procedure Laterality Date  . arm surgery Left    broken arm  . HERNIA REPAIR      Family History  Problem Relation Age of Onset  . Cancer Mother   . Heart disease Father   . Diabetes Maternal Grandmother   . Heart disease Paternal Grandfather   No bleeding or clotting disorders  Social History Social History  Substance Use Topics  . Smoking status: Never Smoker  . Smokeless tobacco: Never Used  . Alcohol use 0.0 oz/week     Comment: very very rare  No  IVDU  Allergies  Allergen Reactions  . Sulfa Antibiotics Rash    Current Outpatient Prescriptions  Medication Sig Dispense Refill  . acetaminophen (TYLENOL) 500 MG tablet Take 500 mg by mouth every 6 (six) hours as needed.    Marland Kitchen aspirin 81 MG tablet Take 81 mg by mouth daily.    . clotrimazole-betamethasone (LOTRISONE) cream Apply 1 application topically 2 (two) times daily. 30 g 0  . diclofenac sodium (VOLTAREN) 1 % GEL Apply to area BID PRN 100 g 0  . docusate sodium (COLACE) 100 MG capsule Take 100 mg by mouth daily.     Marland Kitchen EPINEPHrine 0.3 mg/0.3 mL IJ SOAJ injection Inject 0.3 mLs (0.3 mg total) into the muscle once. 1 Device 5  . glucose blood (BAYER CONTOUR NEXT TEST) test strip One test qday 100 each 2  . ibuprofen (ADVIL,MOTRIN) 800 MG tablet TAKE ONE TABLET BY MOUTH THREE TIMES A DAY 270 tablet 3  . insulin detemir (LEVEMIR) 100 UNIT/ML injection Inject 48-58 Units into the skin 2 (two) times daily. Takes 48 units qam, 58 units qpm- is currently taking 45 units qam, 55 q pm    . LEVEMIR FLEXTOUCH 100 UNIT/ML Pen INJECT 70 UNITS SUBCUTANEOUS TWO TIMES DAILY 45 mL 3  . liraglutide (VICTOZA) 18 MG/3ML SOPN INJECT 1.8 MLS (10.8 MG TOTAL) INTO THE SKIN ONCE DAILY 27 mL 5  . losartan (COZAAR) 25 MG tablet Take 25 mg  by mouth daily. Reported on 02/26/2016    . losartan (COZAAR) 50 MG tablet TAKE 1 TABLET BY MOUTH ONCE DAILY 90 tablet 1  . metFORMIN (GLUCOPHAGE) 1000 MG tablet TAKE 1 TABLET BY MOUTH TWICE DAILY 180 tablet 1  . mupirocin ointment (BACTROBAN) 2 % APPLY TO AFFECTED AREA(S) AS NEEDED 22 g 1  . NOVOFINE 32G X 6 MM MISC USE AS DIRECTED 3 TIMES A DAY 300 each 3  . NOVOLOG FLEXPEN 100 UNIT/ML FlexPen INJECT 20 UNITS SUBCUTANEOUSLY THREE TIMES A DAY (Patient taking differently: INJECT 22 UNITS SUBCUTANEOUSLY THREE TIMES A DAY) 60 mL 4  . NOVOLOG FLEXPEN 100 UNIT/ML FlexPen INJECT 22 UNITS INTO THE SKIN 3 TIMES DAILY BEFORE MEALS. 15 mL 5  . nystatin cream (MYCOSTATIN) Apply 1  application topically 2 (two) times daily. 30 g 0  . pantoprazole (PROTONIX) 40 MG tablet TAKE 1 TABLET BY MOUTH ONCE DAILY 90 tablet 2  . pantoprazole (PROTONIX) 40 MG tablet TAKE 1 TABLET BY MOUTH ONCE DAILY 90 tablet 2  . rosuvastatin (CRESTOR) 5 MG tablet Take 5 mg by mouth daily.    . sertraline (ZOLOFT) 100 MG tablet TAKE 1 TABLET BY MOUTH ONCE DAILY 90 tablet 1  . triamcinolone cream (KENALOG) 0.5 % Apply 1 application topically 3 (three) times daily. 60 g 0  . VICTOZA 18 MG/3ML SOPN INJECT 1.8MG  SUBCUTANEOUSLY DAILY 27 mL 0  . BAYER MICROLET LANCETS lancets USE AS DIRECTED THREE TIMES A DAY (Patient not taking: Reported on 10/30/2016) 300 each 2  . cephALEXin (KEFLEX) 500 MG capsule Take 1 capsule (500 mg total) by mouth 4 (four) times daily. (Patient not taking: Reported on 10/30/2016) 28 capsule 0  . ciprofloxacin (CIPRO) 500 MG tablet Take 1 tablet (500 mg total) by mouth 2 (two) times daily. (Patient not taking: Reported on 10/30/2016) 20 tablet 0  . clindamycin (CLEOCIN) 300 MG capsule Take 1 capsule (300 mg total) by mouth 3 (three) times daily. (Patient not taking: Reported on 10/30/2016) 30 capsule 0  . clindamycin (CLEOCIN) 300 MG capsule Take 1 capsule (300 mg total) by mouth 3 (three) times daily. (Patient not taking: Reported on 10/30/2016) 30 capsule 0  . metFORMIN (GLUCOPHAGE) 500 MG tablet Take 500 mg by mouth every morning.    . metFORMIN (GLUCOPHAGE) 500 MG tablet Take 1,000 mg by mouth every evening.    . metroNIDAZOLE (FLAGYL) 500 MG tablet Take 1 tablet (500 mg total) by mouth 2 (two) times daily. (Patient not taking: Reported on 10/30/2016) 20 tablet 0   No current facility-administered medications for this visit.       REVIEW OF SYSTEMS (Negative unless checked)  Constitutional: [] Weight loss  [] Fever  [] Chills Cardiac: [] Chest pain   [] Chest pressure   [] Palpitations   [] Shortness of breath when laying flat   [] Shortness of breath at rest   [] Shortness of breath with  exertion. Vascular:  [] Pain in legs with walking   [] Pain in legs at rest   [] Pain in legs when laying flat   [] Claudication   [] Pain in feet when walking  [x] Pain in feet at rest  [] Pain in feet when laying flat   [] History of DVT   [] Phlebitis   [x] Swelling in legs   [] Varicose veins   [] Non-healing ulcers Pulmonary:   [] Uses home oxygen   [] Productive cough   [] Hemoptysis   [] Wheeze  [] COPD   [] Asthma Neurologic:  [] Dizziness  [] Blackouts   [] Seizures   [] History of stroke   [] History of  TIA  [] Aphasia   [] Temporary blindness   [] Dysphagia   [x] Weakness or numbness in arms   [x] Weakness or numbness in legs Musculoskeletal:  [] Arthritis   [] Joint swelling   [] Joint pain   [] Low back pain Hematologic:  [] Easy bruising  [] Easy bleeding   [] Hypercoagulable state   [] Anemic  [] Hepatitis Gastrointestinal:  [] Blood in stool   [] Vomiting blood  [] Gastroesophageal reflux/heartburn   [] Abdominal pain Genitourinary:  [] Chronic kidney disease   [] Difficult urination  [] Frequent urination  [] Burning with urination   [] Hematuria Skin:  [] Rashes   [] Ulcers   [] Wounds Psychological:  [] History of anxiety   []  History of major depression.    Physical Exam BP (!) 148/74   Pulse 69   Resp 16   Ht 5\' 5"  (1.651 m)   Wt (!) 325 lb (147.4 kg)   BMI 54.08 kg/m  Gen:  WD/WN, NAD Head: [place]/AT, No temporalis wasting. Prominent temp pulse not noted. Ear/Nose/Throat: Hearing grossly intact, nares w/o erythema or drainage, oropharynx w/o Erythema/Exudate Eyes: Conjunctiva clear, sclera non-icteric  Neck: trachea midline.  No JVD.  Pulmonary:  Good air movement, no use of accessory muscles Cardiac: RRR, normal S1, S2 Vascular: Vessel Right Left  Radial Palpable Palpable  Ulnar Palpable Palpable  Brachial Palpable Palpable  Carotid Palpable, without bruit Palpable, without bruit  Aorta Not palpable N/A  Femoral Palpable Palpable  Popliteal Palpable Palpable  PT 1+ Palpable 1+ Palpable  DP 2+ Palpable 1+  Palpable   Gastrointestinal: soft, non-tender/non-distended. No guarding/reflex. No masses, surgical incisions, or scars. Musculoskeletal: In a wheelchair with diffuse weakness in LE.  Has braces for both legs. Trace RLE edema, 1-2+ LLE edema. Neurologic: Sensation grossly intact in extremities.  Symmetrical.  Speech is fluent.  Psychiatric: Judgment intact, Mood & affect appropriate for pt's clinical situation. Very pleasant. Dermatologic: No rashes or ulcers noted.  No cellulitis or open wounds. Lymph : No Cervical, Axillary, or Inguinal lymphadenopathy.   Radiology No results found.  Labs No results found for this or any previous visit (from the past 2160 hour(s)).  Assessment/Plan:  Diabetes type 2, uncontrolled blood glucose control important in reducing the progression of atherosclerotic disease. Also, involved in wound healing. On appropriate medications.   Charcot-Marie-Tooth disease Many of her lower extremity symptoms could be secondary to her Charcot-Marie-Tooth disease or diabetic neuropathy, but certainly associated arterial or venous disease could be playing a contributing role.  Pain in limb Many of her lower extremity symptoms could be secondary to her Charcot-Marie-Tooth disease or diabetic neuropathy, but certainly associated arterial or venous disease could be playing a contributing role. Given her history of diabetes and the nature of her symptoms, I think checking ABIs of be very prudent. These will be done at her convenience in the near future. I have discussed the differences between arterial disease and venous disease and also how they may differ from neurologic issues.  Swelling of limb The patient has diminished mobility with legs are dependent much of the time, and this certainly can create swelling. Nonetheless, significant venous disease Also be present worsening her symptoms and also increasing the swelling and discoloration in the left leg. Elevation and  compression stockings which she is artery using seem reasonable as conservative therapies, I would recommend a venous reflux study be done at her convenience in the near future for further evaluation.      Leotis Pain 10/30/2016, 4:42 PM   This note was created with Dragon medical transcription system.  Any errors from  dictation are unintentional.

## 2016-10-30 NOTE — Assessment & Plan Note (Signed)
Many of her lower extremity symptoms could be secondary to her Charcot-Marie-Tooth disease or diabetic neuropathy, but certainly associated arterial or venous disease could be playing a contributing role. Given her history of diabetes and the nature of her symptoms, I think checking ABIs of be very prudent. These will be done at her convenience in the near future. I have discussed the differences between arterial disease and venous disease and also how they may differ from neurologic issues.

## 2016-10-30 NOTE — Assessment & Plan Note (Signed)
blood glucose control important in reducing the progression of atherosclerotic disease. Also, involved in wound healing. On appropriate medications.  

## 2016-11-16 ENCOUNTER — Other Ambulatory Visit: Payer: Self-pay

## 2016-11-16 ENCOUNTER — Encounter: Payer: Self-pay | Admitting: *Deleted

## 2016-11-16 ENCOUNTER — Emergency Department
Admission: EM | Admit: 2016-11-16 | Discharge: 2016-11-16 | Disposition: A | Discharge status: Home / Self Care | Payer: 59 | Attending: Emergency Medicine | Admitting: Emergency Medicine

## 2016-11-16 ENCOUNTER — Emergency Department: Payer: 59

## 2016-11-16 DIAGNOSIS — J101 Influenza due to other identified influenza virus with other respiratory manifestations: Secondary | ICD-10-CM | POA: Diagnosis not present

## 2016-11-16 DIAGNOSIS — R509 Fever, unspecified: Secondary | ICD-10-CM | POA: Diagnosis not present

## 2016-11-16 DIAGNOSIS — Z79899 Other long term (current) drug therapy: Secondary | ICD-10-CM | POA: Diagnosis not present

## 2016-11-16 DIAGNOSIS — Z7982 Long term (current) use of aspirin: Secondary | ICD-10-CM | POA: Insufficient documentation

## 2016-11-16 DIAGNOSIS — Z794 Long term (current) use of insulin: Secondary | ICD-10-CM | POA: Diagnosis not present

## 2016-11-16 DIAGNOSIS — R6889 Other general symptoms and signs: Secondary | ICD-10-CM | POA: Diagnosis not present

## 2016-11-16 DIAGNOSIS — R05 Cough: Secondary | ICD-10-CM | POA: Diagnosis not present

## 2016-11-16 DIAGNOSIS — I10 Essential (primary) hypertension: Secondary | ICD-10-CM | POA: Insufficient documentation

## 2016-11-16 DIAGNOSIS — E860 Dehydration: Secondary | ICD-10-CM | POA: Diagnosis not present

## 2016-11-16 DIAGNOSIS — E119 Type 2 diabetes mellitus without complications: Secondary | ICD-10-CM | POA: Insufficient documentation

## 2016-11-16 DIAGNOSIS — R531 Weakness: Secondary | ICD-10-CM | POA: Diagnosis not present

## 2016-11-16 DIAGNOSIS — J111 Influenza due to unidentified influenza virus with other respiratory manifestations: Secondary | ICD-10-CM | POA: Diagnosis not present

## 2016-11-16 LAB — INFLUENZA PANEL BY PCR (TYPE A & B)
INFLAPCR: POSITIVE — AB
Influenza B By PCR: NEGATIVE

## 2016-11-16 LAB — CBC WITH DIFFERENTIAL/PLATELET
Basophils Absolute: 0 10*3/uL (ref 0–0.1)
Basophils Relative: 1 %
EOS ABS: 0.6 10*3/uL (ref 0–0.7)
Eosinophils Relative: 8 %
HCT: 30.7 % — ABNORMAL LOW (ref 35.0–47.0)
HEMOGLOBIN: 10.2 g/dL — AB (ref 12.0–16.0)
LYMPHS ABS: 0.9 10*3/uL — AB (ref 1.0–3.6)
LYMPHS PCT: 13 %
MCH: 25.4 pg — AB (ref 26.0–34.0)
MCHC: 33.2 g/dL (ref 32.0–36.0)
MCV: 76.6 fL — AB (ref 80.0–100.0)
Monocytes Absolute: 1 10*3/uL — ABNORMAL HIGH (ref 0.2–0.9)
Monocytes Relative: 14 %
NEUTROS PCT: 64 %
Neutro Abs: 4.6 10*3/uL (ref 1.4–6.5)
Platelets: 181 10*3/uL (ref 150–440)
RBC: 4.01 MIL/uL (ref 3.80–5.20)
RDW: 16.7 % — ABNORMAL HIGH (ref 11.5–14.5)
WBC: 7.1 10*3/uL (ref 3.6–11.0)

## 2016-11-16 LAB — COMPREHENSIVE METABOLIC PANEL
ALT: 41 U/L (ref 14–54)
AST: 46 U/L — ABNORMAL HIGH (ref 15–41)
Albumin: 3.9 g/dL (ref 3.5–5.0)
Alkaline Phosphatase: 24 U/L — ABNORMAL LOW (ref 38–126)
Anion gap: 9 (ref 5–15)
BUN: 26 mg/dL — AB (ref 6–20)
CALCIUM: 8.6 mg/dL — AB (ref 8.9–10.3)
CO2: 20 mmol/L — AB (ref 22–32)
CREATININE: 0.82 mg/dL (ref 0.44–1.00)
Chloride: 109 mmol/L (ref 101–111)
GFR calc non Af Amer: >60 mL/min (ref >60)
Glucose, Bld: 146 mg/dL — ABNORMAL HIGH (ref 65–99)
Potassium: 5.1 mmol/L (ref 3.5–5.1)
SODIUM: 138 mmol/L (ref 135–145)
Total Bilirubin: 0.6 mg/dL (ref 0.3–1.2)
Total Protein: 7.2 g/dL (ref 6.5–8.1)

## 2016-11-16 LAB — TROPONIN I: Troponin I: <0.03 ng/mL (ref <0.03)

## 2016-11-16 LAB — CK: Total CK: 563 U/L — ABNORMAL HIGH (ref 38–234)

## 2016-11-16 MED ORDER — LEVOFLOXACIN 750 MG PO TABS: 750.0000 mg | ORAL_TABLET | Freq: Every day | ORAL | 0 refills | Status: DC

## 2016-11-16 MED ORDER — OSELTAMIVIR PHOSPHATE 75 MG PO CAPS: 75.0000 mg | ORAL_CAPSULE | Freq: Two times a day (BID) | ORAL | 0 refills | Status: AC

## 2016-11-16 MED ORDER — SODIUM CHLORIDE 0.9 % IV SOLN
1000.0000 mL | Freq: Once | INTRAVENOUS | Status: AC
  Administered 2016-11-16: 1000 mL via INTRAVENOUS

## 2016-11-16 MED ORDER — OSELTAMIVIR PHOSPHATE 75 MG PO CAPS
75.0000 mg | ORAL_CAPSULE | Freq: Once | ORAL | Status: AC
  Administered 2016-11-16: 75 mg via ORAL
  Filled 2016-11-16: qty 1

## 2016-11-16 NOTE — ED Provider Notes (Signed)
Patient received in sign-out from Dr. Corky Downs.  Workup and evaluation pending IV fluids and continue monitoring. Patient remains he mechanically stable. She is being treated for flu. Has a physician at home and feels comfortable managing her and rechecking. Patient hemodynamic stable and in no acute distress. Do feel patient is stable for close follow-up.Marland Kitchen      Merlyn Lot, MD 11/16/16 [phone]

## 2016-11-16 NOTE — ED Provider Notes (Addendum)
[facility] Emergency Department Provider Note   ____________________________________________    I have reviewed the triage vital signs and the nursing notes.   HISTORY  Chief Complaint Fatigue     HPI Maria Singh is a 66 y.o. female with a history of CMT muscular atrophy, diabetes who presents with complaints of fatigue, fever and worsening lower extremity weakness. Patient reports at baseline she is able to transfer with heavy support but over the last several days she has felt much weaker in her lower extremities. She reports this is similar to several years ago when she had rhabdomyolysis. She does report a cough as well which is occasionally productive   Past Medical History:  Diagnosis Date  . 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 Depression   . Diabetes mellitus without complication ([place])   . GERD (gastroesophageal reflux disease)   . Hyperlipidemia   . Hypertension   . Rotator cuff injury Nov. 2012   left arm  . Sleep apnea     Patient Active Problem List   Diagnosis Date Noted  . Charcot-Marie-Tooth disease 10/30/2016  . Pain in limb 10/30/2016  . Swelling of limb 10/30/2016  . Diabetes type 2, uncontrolled ([place]) 06/19/2015    Past Surgical History:  Procedure Laterality Date  . arm surgery Left    broken arm  . HERNIA REPAIR      Prior to Admission medications   Medication Sig Start Date End Date Taking? Authorizing Provider  acetaminophen (TYLENOL) 500 MG tablet Take 500 mg by mouth every 6 (six) hours as needed.    Historical Provider, MD  aspirin 81 MG tablet Take 81 mg by mouth daily.    Historical Provider, MD  BAYER MICROLET LANCETS lancets USE AS DIRECTED THREE TIMES A DAY Patient not taking: Reported on 10/30/2016 08/10/16   Juline Patch, MD  cephALEXin (KEFLEX) 500 MG capsule Take 1 capsule (500 mg total) by mouth 4 (four) times daily. Patient not taking: Reported on 10/30/2016 07/09/15   Juline Patch,  MD  ciprofloxacin (CIPRO) 500 MG tablet Take 1 tablet (500 mg total) by mouth 2 (two) times daily. Patient not taking: Reported on 10/30/2016 06/07/15   Juline Patch, MD  clindamycin (CLEOCIN) 300 MG capsule Take 1 capsule (300 mg total) by mouth 3 (three) times daily. Patient not taking: Reported on 10/30/2016 09/07/16   Juline Patch, MD  clindamycin (CLEOCIN) 300 MG capsule Take 1 capsule (300 mg total) by mouth 3 (three) times daily. Patient not taking: Reported on 10/30/2016 10/14/16   Juline Patch, MD  clotrimazole-betamethasone (LOTRISONE) cream Apply 1 application topically 2 (two) times daily. 10/07/16   Juline Patch, MD  diclofenac sodium (VOLTAREN) 1 % GEL Apply to area BID PRN 12/27/15   Juline Patch, MD  docusate sodium (COLACE) 100 MG capsule Take 100 mg by mouth daily.     Historical Provider, MD  EPINEPHrine 0.3 mg/0.3 mL IJ SOAJ injection Inject 0.3 mLs (0.3 mg total) into the muscle once. 06/05/15   Juline Patch, MD  glucose blood (BAYER CONTOUR NEXT TEST) test strip One test qday 03/24/16   Juline Patch, MD  ibuprofen (ADVIL,MOTRIN) 800 MG tablet TAKE ONE TABLET BY MOUTH THREE TIMES A DAY 11/27/15   Juline Patch, MD  insulin detemir (LEVEMIR) 100 UNIT/ML injection Inject 48-58 Units into the skin 2 (two) times daily. Takes 48 units qam, 58 units qpm- is currently taking 45  units qam, 55 q pm    Historical Provider, MD  LEVEMIR FLEXTOUCH 100 UNIT/ML Pen INJECT 70 UNITS SUBCUTANEOUS TWO TIMES DAILY 09/28/16   Juline Patch, MD  liraglutide (VICTOZA) 18 MG/3ML SOPN INJECT 1.8 MLS (10.8 MG TOTAL) INTO THE SKIN ONCE DAILY 09/07/16   Juline Patch, MD  losartan (COZAAR) 25 MG tablet Take 25 mg by mouth daily. Reported on 02/26/2016    Historical Provider, MD  losartan (COZAAR) 50 MG tablet TAKE 1 TABLET BY MOUTH ONCE DAILY 07/03/16   Juline Patch, MD  metFORMIN (GLUCOPHAGE) 1000 MG tablet TAKE 1 TABLET BY MOUTH TWICE DAILY 10/08/16   Juline Patch, MD  metFORMIN (GLUCOPHAGE)  500 MG tablet Take 500 mg by mouth every morning.    Historical Provider, MD  metFORMIN (GLUCOPHAGE) 500 MG tablet Take 1,000 mg by mouth every evening.    Historical Provider, MD  metroNIDAZOLE (FLAGYL) 500 MG tablet Take 1 tablet (500 mg total) by mouth 2 (two) times daily. Patient not taking: Reported on 10/30/2016 06/07/15   Juline Patch, MD  mupirocin ointment (BACTROBAN) 2 % APPLY TO AFFECTED AREA(S) AS NEEDED 08/03/16   Juline Patch, MD  NOVOFINE 32G X 6 MM MISC USE AS DIRECTED 3 TIMES A DAY 08/10/16   Juline Patch, MD  NOVOLOG FLEXPEN 100 UNIT/ML FlexPen INJECT 20 UNITS SUBCUTANEOUSLY THREE TIMES A DAY Patient taking differently: INJECT 22 UNITS SUBCUTANEOUSLY THREE TIMES A DAY 06/17/16   Juline Patch, MD  NOVOLOG FLEXPEN 100 UNIT/ML FlexPen INJECT 22 UNITS INTO THE SKIN 3 TIMES DAILY BEFORE MEALS. 10/08/16   Juline Patch, MD  nystatin cream (MYCOSTATIN) Apply 1 application topically 2 (two) times daily. 10/07/16   Juline Patch, MD  oseltamivir (TAMIFLU) 75 MG capsule Take 1 capsule (75 mg total) by mouth 2 (two) times daily. 11/16/16 11/21/16  Lavonia Drafts, MD  pantoprazole (PROTONIX) 40 MG tablet TAKE 1 TABLET BY MOUTH ONCE DAILY 05/01/16   Juline Patch, MD  pantoprazole (PROTONIX) 40 MG tablet TAKE 1 TABLET BY MOUTH ONCE DAILY 05/04/16   Juline Patch, MD  rosuvastatin (CRESTOR) 5 MG tablet Take 5 mg by mouth daily.    Historical Provider, MD  sertraline (ZOLOFT) 100 MG tablet TAKE 1 TABLET BY MOUTH ONCE DAILY 07/03/16   Juline Patch, MD  triamcinolone cream (KENALOG) 0.5 % Apply 1 application topically 3 (three) times daily. 07/09/15   Juline Patch, MD  VICTOZA 18 MG/3ML SOPN INJECT 1.8MG  SUBCUTANEOUSLY DAILY 06/07/15   Juline Patch, MD     Allergies Sulfa antibiotics  Family History  Problem Relation Age of Onset  . Cancer Mother   . Heart disease Father   . Diabetes Maternal Grandmother   . Heart disease Paternal Grandfather     Social History Social History    Substance Use Topics  . Smoking status: Never Smoker  . Smokeless tobacco: Never Used  . Alcohol use 0.0 oz/week     Comment: very very rare    Review of Systems  Constitutional:Positive fevers Eyes: No visual changes.  ENT: No sore throat. Cardiovascular: Denies chest pain. Respiratory: Positive cough Gastrointestinal: Occasional nausea  Genitourinary: Negative for dysuria. Musculoskeletal: Lower extremity weakness as above Skin: Negative for rash. Neurological: Negative for headaches   10-point ROS otherwise negative.  ____________________________________________   PHYSICAL EXAM:  VITAL SIGNS: ED Triage Vitals  Enc Vitals Group     BP 11/16/16 1348 116/68  Pulse Rate 11/16/16 1337 90     Resp 11/16/16 1337 (!) 22     Temp 11/16/16 1337 99.1 F (37.3 C)     Temp Source 11/16/16 1337 Oral     SpO2 11/16/16 1337 98 %     Weight 11/16/16 1337 (!) 325 lb (147.4 kg)     Height 11/16/16 1337 5\' 5"  (1.651 m)     Head Circumference --      Peak Flow --      Pain Score --      Pain Loc --      Pain Edu? --      Excl. in [place]? --     Constitutional: Alert and oriented. No acute distress. Pleasant and interactive Eyes: Conjunctivae are normal.   Nose: No congestion/rhinnorhea. Mouth/Throat: Mucous membranes are moist.    Cardiovascular: Normal rate, regular rhythm. Grossly normal heart sounds.  Good peripheral circulation. Respiratory: Normal respiratory effort.  No retractions. Lungs CTAB. Gastrointestinal: Soft and nontender. No distention.  No CVA tenderness.  Musculoskeletal:  Warm and well perfused Neurologic:  Normal speech and language. No gross focal neurologic deficits are appreciated.  Skin:  Skin is warm, dry and intact. No rash noted. Psychiatric: Mood and affect are normal. Speech and behavior are normal.  ____________________________________________   LABS (all labs ordered are listed, but only abnormal results are displayed)  Labs Reviewed   CK - Abnormal; Notable for the following:       Result Value   Total CK 563 (*)    All other components within normal limits  CBC WITH DIFFERENTIAL/PLATELET - Abnormal; Notable for the following:    Hemoglobin 10.2 (*)    HCT 30.7 (*)    MCV 76.6 (*)    MCH 25.4 (*)    RDW 16.7 (*)    Lymphs Abs 0.9 (*)    Monocytes Absolute 1.0 (*)    All other components within normal limits  COMPREHENSIVE METABOLIC PANEL - Abnormal; Notable for the following:    CO2 20 (*)    Glucose, Bld 146 (*)    BUN 26 (*)    Calcium 8.6 (*)    AST 46 (*)    Alkaline Phosphatase 24 (*)    All other components within normal limits  INFLUENZA PANEL BY PCR (TYPE A & B) - Abnormal; Notable for the following:    Influenza A By PCR POSITIVE (*)    All other components within normal limits  CULTURE, BLOOD (ROUTINE X 2)  CULTURE, BLOOD (ROUTINE X 2)  TROPONIN I   ____________________________________________  EKG  ED ECG REPORT I, Lavonia Drafts, the attending physician, personally viewed and interpreted this ECG.  Date: 11/16/2016 EKG Time: 1:37 PM Rate: 77 Rhythm: normal sinus rhythm QRS Axis: normal Intervals: normal ST/T Wave abnormalities: normal Conduction Disturbances: none Narrative Interpretation: unremarkable  ____________________________________________  RADIOLOGY  Chest x-ray with atelectasis versus infiltrate ____________________________________________   PROCEDURES  Procedure(s) performed: No    Critical Care performed: No ____________________________________________   INITIAL IMPRESSION / ASSESSMENT AND PLAN / ED COURSE  Pertinent labs & imaging results that were available during my care of the patient were reviewed by me and considered in my medical decision making (see chart for details).  Patient overall well-appearing and in no acute distress. Mild elevation in temperature and cough with a history of CMT muscular atrophy. Differential includes  pneumonia/influenza/URI/rhabdo. Pending labs, chest x-ray we'll monitor closely    Chest x-ray shows atelectasis or possible infiltrate. Normal  white blood cell count temperature 99.1 heart rate 90 ____________________________________________ ----------------------------------------- 3:33 PM on 11/16/2016 -----------------------------------------  Patient with mild dehydration, CK of 563, not consistent with rhabdomyolysis however we will give a liter of IV fluids while awaiting the remainder of the tests. I have asked Dr. Quentin Cornwall to follow up on remaining tests and distal appropriately, I suspect discharge and I discussed this with the patient.  FINAL CLINICAL IMPRESSION(S) / ED DIAGNOSES  Influenza Dehydration   NEW MEDICATIONS STARTED DURING THIS VISIT:  New Prescriptions   OSELTAMIVIR (TAMIFLU) 75 MG CAPSULE    Take 1 capsule (75 mg total) by mouth 2 (two) times daily.     Note:  This document was prepared using Dragon voice recognition software and may include unintentional dictation errors.    Lavonia Drafts, MD 11/16/16 [place], MD 11/16/16 [id number]

## 2016-11-16 NOTE — ED Triage Notes (Signed)
Pt has charko-marie- tooth syndrome, pt reports increased generalized weakness with fevers starting on Friday, pt reports cough , pt denies any other symptoms

## 2016-11-17 ENCOUNTER — Other Ambulatory Visit: Payer: Self-pay

## 2016-11-17 MED ORDER — AZITHROMYCIN 250 MG PO TABS: ORAL_TABLET | ORAL | 0 refills | Status: DC

## 2016-11-21 LAB — CULTURE, BLOOD (ROUTINE X 2)
CULTURE: NO GROWTH
CULTURE: NO GROWTH

## 2016-11-24 DIAGNOSIS — F4320 Adjustment disorder, unspecified: Secondary | ICD-10-CM | POA: Diagnosis not present

## 2016-11-25 ENCOUNTER — Ambulatory Visit (INDEPENDENT_AMBULATORY_CARE_PROVIDER_SITE_OTHER): Payer: 59

## 2016-11-25 ENCOUNTER — Ambulatory Visit (INDEPENDENT_AMBULATORY_CARE_PROVIDER_SITE_OTHER): Payer: 59 | Admitting: Vascular Surgery

## 2016-11-25 DIAGNOSIS — M79605 Pain in left leg: Secondary | ICD-10-CM | POA: Diagnosis not present

## 2016-11-25 DIAGNOSIS — M7989 Other specified soft tissue disorders: Secondary | ICD-10-CM

## 2016-12-08 DIAGNOSIS — F4320 Adjustment disorder, unspecified: Secondary | ICD-10-CM | POA: Diagnosis not present

## 2016-12-22 DIAGNOSIS — F4320 Adjustment disorder, unspecified: Secondary | ICD-10-CM | POA: Diagnosis not present

## 2017-01-01 ENCOUNTER — Other Ambulatory Visit: Payer: Self-pay | Admitting: Family Medicine

## 2017-01-05 DIAGNOSIS — F4320 Adjustment disorder, unspecified: Secondary | ICD-10-CM | POA: Diagnosis not present

## 2017-01-06 ENCOUNTER — Other Ambulatory Visit: Payer: Self-pay

## 2017-01-06 MED ORDER — CLOTRIMAZOLE-BETAMETHASONE 1-0.05 % EX CREA: 1.0000 "application " | TOPICAL_CREAM | Freq: Two times a day (BID) | CUTANEOUS | 1 refills | Status: DC

## 2017-01-13 ENCOUNTER — Other Ambulatory Visit: Payer: Self-pay | Admitting: Family Medicine

## 2017-01-18 ENCOUNTER — Other Ambulatory Visit: Payer: Self-pay | Admitting: Family Medicine

## 2017-02-02 DIAGNOSIS — F4320 Adjustment disorder, unspecified: Secondary | ICD-10-CM | POA: Diagnosis not present

## 2017-02-16 DIAGNOSIS — F4320 Adjustment disorder, unspecified: Secondary | ICD-10-CM | POA: Diagnosis not present

## 2017-02-23 ENCOUNTER — Encounter (INDEPENDENT_AMBULATORY_CARE_PROVIDER_SITE_OTHER): Payer: Medicare Other

## 2017-02-23 ENCOUNTER — Ambulatory Visit (INDEPENDENT_AMBULATORY_CARE_PROVIDER_SITE_OTHER): Payer: 59 | Admitting: Vascular Surgery

## 2017-03-02 ENCOUNTER — Other Ambulatory Visit: Payer: Self-pay

## 2017-03-02 MED ORDER — INSULIN GLARGINE 100 UNIT/ML SOLOSTAR PEN: 110.0000 [IU] | PEN_INJECTOR | Freq: Every day | SUBCUTANEOUS | 99 refills | Status: DC

## 2017-03-16 DIAGNOSIS — F4320 Adjustment disorder, unspecified: Secondary | ICD-10-CM | POA: Diagnosis not present

## 2017-03-30 DIAGNOSIS — F4320 Adjustment disorder, unspecified: Secondary | ICD-10-CM | POA: Diagnosis not present

## 2017-04-09 ENCOUNTER — Other Ambulatory Visit: Payer: Self-pay

## 2017-04-13 DIAGNOSIS — F4320 Adjustment disorder, unspecified: Secondary | ICD-10-CM | POA: Diagnosis not present

## 2017-05-11 DIAGNOSIS — F4320 Adjustment disorder, unspecified: Secondary | ICD-10-CM | POA: Diagnosis not present

## 2017-05-25 DIAGNOSIS — F4320 Adjustment disorder, unspecified: Secondary | ICD-10-CM | POA: Diagnosis not present

## 2017-06-08 DIAGNOSIS — F4320 Adjustment disorder, unspecified: Secondary | ICD-10-CM | POA: Diagnosis not present

## 2017-06-09 ENCOUNTER — Other Ambulatory Visit: Payer: Self-pay

## 2017-06-09 MED ORDER — NYSTATIN 100000 UNIT/GM EX CREA: 1.0000 "application " | TOPICAL_CREAM | Freq: Two times a day (BID) | CUTANEOUS | 2 refills | Status: DC

## 2017-07-06 DIAGNOSIS — F4320 Adjustment disorder, unspecified: Secondary | ICD-10-CM | POA: Diagnosis not present

## 2017-07-08 ENCOUNTER — Other Ambulatory Visit: Payer: Self-pay | Admitting: Family Medicine

## 2017-07-13 ENCOUNTER — Other Ambulatory Visit: Payer: Self-pay | Admitting: Family Medicine

## 2017-07-20 DIAGNOSIS — F4320 Adjustment disorder, unspecified: Secondary | ICD-10-CM | POA: Diagnosis not present

## 2017-07-21 ENCOUNTER — Other Ambulatory Visit: Payer: Self-pay | Admitting: Family Medicine

## 2017-07-29 ENCOUNTER — Other Ambulatory Visit: Payer: Self-pay | Admitting: Family Medicine

## 2017-08-03 DIAGNOSIS — F4320 Adjustment disorder, unspecified: Secondary | ICD-10-CM | POA: Diagnosis not present

## 2017-08-05 ENCOUNTER — Other Ambulatory Visit: Payer: Self-pay

## 2017-08-10 ENCOUNTER — Ambulatory Visit (INDEPENDENT_AMBULATORY_CARE_PROVIDER_SITE_OTHER): Payer: 59 | Admitting: Family Medicine

## 2017-08-10 VITALS — BP 138/70 | HR 80 | Ht 65.0 in | Wt 325.0 lb

## 2017-08-10 DIAGNOSIS — I10 Essential (primary) hypertension: Secondary | ICD-10-CM | POA: Diagnosis not present

## 2017-08-10 DIAGNOSIS — E782 Mixed hyperlipidemia: Secondary | ICD-10-CM

## 2017-08-10 DIAGNOSIS — Z1159 Encounter for screening for other viral diseases: Secondary | ICD-10-CM | POA: Diagnosis not present

## 2017-08-10 DIAGNOSIS — F341 Dysthymic disorder: Secondary | ICD-10-CM

## 2017-08-10 DIAGNOSIS — K219 Gastro-esophageal reflux disease without esophagitis: Secondary | ICD-10-CM | POA: Diagnosis not present

## 2017-08-10 DIAGNOSIS — E1169 Type 2 diabetes mellitus with other specified complication: Secondary | ICD-10-CM | POA: Insufficient documentation

## 2017-08-10 DIAGNOSIS — G600 Hereditary motor and sensory neuropathy: Secondary | ICD-10-CM

## 2017-08-10 DIAGNOSIS — F329 Major depressive disorder, single episode, unspecified: Secondary | ICD-10-CM

## 2017-08-10 DIAGNOSIS — E785 Hyperlipidemia, unspecified: Secondary | ICD-10-CM

## 2017-08-10 DIAGNOSIS — E1165 Type 2 diabetes mellitus with hyperglycemia: Secondary | ICD-10-CM | POA: Diagnosis not present

## 2017-08-10 DIAGNOSIS — E109 Type 1 diabetes mellitus without complications: Secondary | ICD-10-CM | POA: Diagnosis not present

## 2017-08-10 DIAGNOSIS — F324 Major depressive disorder, single episode, in partial remission: Secondary | ICD-10-CM | POA: Insufficient documentation

## 2017-08-10 MED ORDER — METFORMIN HCL 500 MG PO TABS: 500.0000 mg | ORAL_TABLET | Freq: Every morning | ORAL | 3 refills | Status: DC

## 2017-08-10 MED ORDER — PANTOPRAZOLE SODIUM 40 MG PO TBEC: 40.0000 mg | DELAYED_RELEASE_TABLET | Freq: Every day | ORAL | 3 refills | Status: DC

## 2017-08-10 MED ORDER — SERTRALINE HCL 100 MG PO TABS: 100.0000 mg | ORAL_TABLET | Freq: Every day | ORAL | 3 refills | Status: DC

## 2017-08-10 MED ORDER — LOSARTAN POTASSIUM 25 MG PO TABS: 25.0000 mg | ORAL_TABLET | Freq: Every day | ORAL | 3 refills | Status: DC

## 2017-08-10 MED ORDER — METFORMIN HCL 1000 MG PO TABS: 1000.0000 mg | ORAL_TABLET | Freq: Two times a day (BID) | ORAL | 3 refills | Status: DC

## 2017-08-10 MED ORDER — HUMALOG KWIKPEN 100 UNIT/ML ~~LOC~~ SOPN: 22.0000 [IU] | PEN_INJECTOR | Freq: Three times a day (TID) | SUBCUTANEOUS | 99 refills | Status: DC

## 2017-08-10 MED ORDER — LIRAGLUTIDE 18 MG/3ML ~~LOC~~ SOPN: PEN_INJECTOR | SUBCUTANEOUS | 5 refills | Status: DC

## 2017-08-10 MED ORDER — INSULIN GLARGINE 100 UNIT/ML SOLOSTAR PEN: 110.0000 [IU] | PEN_INJECTOR | Freq: Every day | SUBCUTANEOUS | 99 refills | Status: DC

## 2017-08-10 MED ORDER — INSULIN PEN NEEDLE 32G X 6 MM MISC: 3 refills | Status: DC

## 2017-08-10 MED ORDER — ROSUVASTATIN CALCIUM 5 MG PO TABS: 5.0000 mg | ORAL_TABLET | Freq: Every day | ORAL | 3 refills | Status: DC

## 2017-08-10 NOTE — Progress Notes (Signed)
Name: Maria Singh   MRN: [id number]    DOB: [birth year]   Date:08/10/2017       Progress Note  Subjective  Chief Complaint  Chief Complaint  Patient presents with  . Diabetes  . Hypertension  . Hyperlipidemia  . Depression    Diabetes  She presents for her follow-up diabetic visit. She has type 2 diabetes mellitus. Her disease course has been stable. Hypoglycemia symptoms include dizziness and tremors. Pertinent negatives for hypoglycemia include no headaches or nervousness/anxiousness. Associated symptoms include weakness. Pertinent negatives for diabetes include no blurred vision, no chest pain, no fatigue, no foot paresthesias, no foot ulcerations, no polydipsia, no polyphagia, no polyuria, no visual change and no weight loss. There are no hypoglycemic complications. There are no diabetic complications. Risk factors for coronary artery disease include diabetes mellitus, dyslipidemia and obesity. Current diabetic treatment includes insulin injections and oral agent (monotherapy) (and victoza). She is following a generally healthy diet. Meal planning includes avoidance of concentrated sweets and carbohydrate counting. Her breakfast blood glucose is taken between 8-9 am. Her breakfast blood glucose range is generally 90-110 mg/dl. An ACE inhibitor/angiotensin II receptor blocker is being taken.  Hypertension  This is a chronic problem. The current episode started more than 1 year ago. The problem has been waxing and waning since onset. The problem is controlled. Pertinent negatives include no blurred vision, chest pain, headaches, malaise/fatigue, neck pain, palpitations or shortness of breath. Past treatments include angiotensin blockers. There are no compliance problems.   Hyperlipidemia  This is a chronic problem. Recent lipid tests were reviewed and are normal. Exacerbating diseases include diabetes and obesity. Associated symptoms include a focal weakness. Pertinent negatives include no  chest pain, myalgias or shortness of breath. Current antihyperlipidemic treatment includes statins. The current treatment provides moderate improvement of lipids. There are no compliance problems.   Depression       The patient presents with depression.(Cmt)  This is a chronic problem.  The current episode started more than 1 year ago.   The problem occurs intermittently.  The problem has been waxing and waning since onset.  Associated symptoms include no fatigue, no helplessness, no hopelessness, does not have insomnia, not irritable, no decreased interest, no myalgias, no headaches, not sad and no suicidal ideas.  Past medical history includes depression.   Neurologic Problem  The patient's primary symptoms include focal weakness and weakness. The patient's pertinent negatives include no visual change. Primary symptoms comment: cmt. The problem has been gradually worsening since onset. Associated symptoms include dizziness. Pertinent negatives include no abdominal pain, back pain, chest pain, fatigue, fever, headaches, nausea, neck pain, palpitations or shortness of breath. The treatment provided moderate relief.    No problem-specific Assessment & Plan notes found for this encounter.   Past Medical History:  Diagnosis Date  . 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 Depression   . Diabetes mellitus without complication ([place])   . GERD (gastroesophageal reflux disease)   . Hyperlipidemia   . Hypertension   . Rotator cuff injury Nov. 2012   left arm  . Sleep apnea     Past Surgical History:  Procedure Laterality Date  . arm surgery Left    broken arm  . HERNIA REPAIR      Family History  Problem Relation Age of Onset  . Cancer Mother   . Heart disease Father   . Diabetes Maternal Grandmother   . Heart disease Paternal Grandfather     Social History  Social History  . Marital status: Married    Spouse name: N/A  . Number of children: N/A  . Years of education: N/A    Occupational History  . Not on file.   Social History Main Topics  . Smoking status: Never Smoker  . Smokeless tobacco: Never Used  . Alcohol use 0.0 oz/week     Comment: very very rare  . Drug use: No  . Sexual activity: No   Other Topics Concern  . Not on file   Social History Narrative  . No narrative on file    Allergies  Allergen Reactions  . Sulfa Antibiotics Rash    Outpatient Medications Prior to Visit  Medication Sig Dispense Refill  . acetaminophen (TYLENOL) 500 MG tablet Take 500 mg by mouth every 6 (six) hours as needed.    Marland Kitchen aspirin 81 MG tablet Take 81 mg by mouth daily.    Marland Kitchen BAYER CONTOUR NEXT TEST test strip TEST ONCE DAILY 100 each 2  . BAYER MICROLET LANCETS lancets USE AS DIRECTED THREE TIMES A DAY 300 each 2  . clotrimazole-betamethasone (LOTRISONE) cream Apply 1 application topically 2 (two) times daily. 30 g 1  . diclofenac sodium (VOLTAREN) 1 % GEL Apply to area BID PRN 100 g 0  . docusate sodium (COLACE) 100 MG capsule Take 100 mg by mouth daily.     Marland Kitchen EPINEPHrine 0.3 mg/0.3 mL IJ SOAJ injection Inject 0.3 mLs (0.3 mg total) into the muscle once. 1 Device 5  . ibuprofen (ADVIL,MOTRIN) 800 MG tablet TAKE ONE TABLET BY MOUTH THREE TIMES A DAY 270 tablet 3  . mupirocin ointment (BACTROBAN) 2 % APPLY TO AFFECTED AREA(S) AS NEEDED 22 g 1  . nystatin cream (MYCOSTATIN) Apply 1 application topically 2 (two) times daily. 60 g 2  . triamcinolone cream (KENALOG) 0.5 % Apply 1 application topically 3 (three) times daily. 60 g 0  . VICTOZA 18 MG/3ML SOPN INJECT 1.8MG  SUBCUTANEOUSLY DAILY 27 mL 0  . insulin detemir (LEVEMIR) 100 UNIT/ML injection Inject 48-58 Units into the skin 2 (two) times daily. Takes 48 units qam, 58 units qpm- is currently taking 45 units qam, 55 q pm    . Insulin Glargine (LANTUS SOLOSTAR) 100 UNIT/ML Solostar Pen Inject 110 Units into the skin daily at 10 pm. 60 units in am and 50 units in pm 30 pen PRN  . liraglutide (VICTOZA) 18 MG/3ML  SOPN INJECT 1.8 MLS (10.8 MG TOTAL) INTO THE SKIN ONCE DAILY 27 mL 5  . losartan (COZAAR) 25 MG tablet Take 25 mg by mouth daily. Reported on 02/26/2016    . metFORMIN (GLUCOPHAGE) 1000 MG tablet TAKE 1 TABLET BY MOUTH TWICE DAILY 180 tablet 0  . metFORMIN (GLUCOPHAGE) 500 MG tablet Take 500 mg by mouth every morning.    Marland Kitchen NOVOFINE 32G X 6 MM MISC USE AS DIRECTED 3 TIMES A DAY 300 each 3  . pantoprazole (PROTONIX) 40 MG tablet TAKE 1 TABLET BY MOUTH ONCE DAILY 90 tablet 2  . rosuvastatin (CRESTOR) 5 MG tablet Take 5 mg by mouth daily.    . sertraline (ZOLOFT) 100 MG tablet TAKE 1 TABLET BY MOUTH ONCE DAILY 90 tablet 1  . metroNIDAZOLE (FLAGYL) 500 MG tablet Take 1 tablet (500 mg total) by mouth 2 (two) times daily. (Patient not taking: Reported on 08/10/2017) 20 tablet 0  . azithromycin (ZITHROMAX) 250 MG tablet Use as directed 6 tablet 0  . cephALEXin (KEFLEX) 500 MG capsule Take 1 capsule (500  mg total) by mouth 4 (four) times daily. (Patient not taking: Reported on 10/30/2016) 28 capsule 0  . ciprofloxacin (CIPRO) 500 MG tablet Take 1 tablet (500 mg total) by mouth 2 (two) times daily. (Patient not taking: Reported on 10/30/2016) 20 tablet 0  . clindamycin (CLEOCIN) 300 MG capsule Take 1 capsule (300 mg total) by mouth 3 (three) times daily. (Patient not taking: Reported on 10/30/2016) 30 capsule 0  . clindamycin (CLEOCIN) 300 MG capsule Take 1 capsule (300 mg total) by mouth 3 (three) times daily. (Patient not taking: Reported on 10/30/2016) 30 capsule 0  . losartan (COZAAR) 50 MG tablet TAKE 1 TABLET BY MOUTH ONCE DAILY 90 tablet 0  . metFORMIN (GLUCOPHAGE) 500 MG tablet Take 1,000 mg by mouth every evening.    Marland Kitchen NOVOLOG FLEXPEN 100 UNIT/ML FlexPen INJECT 20 UNITS SUBCUTANEOUSLY THREE TIMES A DAY (Patient taking differently: INJECT 22 UNITS SUBCUTANEOUSLY THREE TIMES A DAY) 60 mL 4  . NOVOLOG FLEXPEN 100 UNIT/ML FlexPen INJECT 22 UNITS INTO THE SKIN 3 TIMES DAILY BEFORE MEALS. 15 mL 5  .  pantoprazole (PROTONIX) 40 MG tablet TAKE 1 TABLET BY MOUTH ONCE DAILY 90 tablet 2   No facility-administered medications prior to visit.     Review of Systems  Constitutional: Negative for chills, fatigue, fever, malaise/fatigue and weight loss.  HENT: Negative for ear discharge, ear pain and sore throat.   Eyes: Negative for blurred vision.  Respiratory: Negative for cough, sputum production, shortness of breath and wheezing.   Cardiovascular: Negative for chest pain, palpitations and leg swelling.  Gastrointestinal: Negative for abdominal pain, blood in stool, constipation, diarrhea, heartburn, melena and nausea.  Genitourinary: Negative for dysuria, frequency, hematuria and urgency.  Musculoskeletal: Negative for back pain, joint pain, myalgias and neck pain.  Skin: Negative for rash.  Neurological: Positive for dizziness, tremors, focal weakness and weakness. Negative for tingling, sensory change and headaches.  Endo/Heme/Allergies: Negative for environmental allergies, polydipsia and polyphagia. Does not bruise/bleed easily.  Psychiatric/Behavioral: Positive for depression. Negative for suicidal ideas. The patient is not nervous/anxious and does not have insomnia.      Objective  Vitals:   08/10/17 1100  BP: 138/70  Pulse: 80  Weight: (!) 325 lb (147.4 kg)  Height: 5\' 5"  (1.651 m)    Physical Exam  Constitutional: She is well-developed, well-nourished, and in no distress. She is not irritable. No distress.  HENT:  Head: Normocephalic and atraumatic.  Right Ear: External ear normal.  Left Ear: External ear normal.  Nose: Nose normal.  Mouth/Throat: Oropharynx is clear and moist.  Eyes: Pupils are equal, round, and reactive to light. Conjunctivae and EOM are normal. Right eye exhibits no discharge. Left eye exhibits no discharge.  Neck: Normal range of motion. Neck supple. No JVD present. No thyromegaly present.  Cardiovascular: Normal rate, regular rhythm, normal heart  sounds and intact distal pulses.  Exam reveals no gallop and no friction rub.   No murmur heard. Pulmonary/Chest: Effort normal and breath sounds normal.  Abdominal: Soft. Bowel sounds are normal. She exhibits no mass. There is no tenderness. There is no guarding.  Musculoskeletal: Normal range of motion. She exhibits no edema.  Lymphadenopathy:    She has no cervical adenopathy.  Neurological: She is alert. She has normal reflexes.  Skin: Skin is warm and dry. She is not diaphoretic.  Psychiatric: Mood and affect normal.  Nursing note and vitals reviewed.     Assessment & Plan  Problem List Items Addressed This Visit  Cardiovascular and Mediastinum   Essential hypertension   Relevant Medications   rosuvastatin (CRESTOR) 5 MG tablet   losartan (COZAAR) 25 MG tablet   Other Relevant Orders   Renal Function Panel     Endocrine   Diabetes type 2, uncontrolled (HCC)   Relevant Medications   metFORMIN (GLUCOPHAGE) 500 MG tablet   metFORMIN (GLUCOPHAGE) 1000 MG tablet   rosuvastatin (CRESTOR) 5 MG tablet   losartan (COZAAR) 25 MG tablet   HUMALOG KWIKPEN 100 UNIT/ML KiwkPen   Insulin Glargine (LANTUS SOLOSTAR) 100 UNIT/ML Solostar Pen   liraglutide (VICTOZA) 18 MG/3ML SOPN   Other Relevant Orders   Renal Function Panel   Hemoglobin A1c   Microalbumin / creatinine urine ratio   Type 1 diabetes mellitus without complication (HCC)   Relevant Medications   metFORMIN (GLUCOPHAGE) 500 MG tablet   metFORMIN (GLUCOPHAGE) 1000 MG tablet   rosuvastatin (CRESTOR) 5 MG tablet   losartan (COZAAR) 25 MG tablet   HUMALOG KWIKPEN 100 UNIT/ML KiwkPen   Insulin Glargine (LANTUS SOLOSTAR) 100 UNIT/ML Solostar Pen   liraglutide (VICTOZA) 18 MG/3ML SOPN   Insulin Pen Needle (NOVOFINE) 32G X 6 MM MISC   Other Relevant Orders   Renal Function Panel   Hemoglobin A1c   Microalbumin / creatinine urine ratio     Nervous and Auditory   Charcot-Marie disease - Primary   Relevant  Medications   sertraline (ZOLOFT) 100 MG tablet     Other   Major depression, chronic   Relevant Medications   sertraline (ZOLOFT) 100 MG tablet   Mixed hyperlipidemia   Relevant Medications   rosuvastatin (CRESTOR) 5 MG tablet   losartan (COZAAR) 25 MG tablet   Other Relevant Orders   Lipid Profile    Other Visit Diagnoses    Gastroesophageal reflux disease, esophagitis presence not specified       Relevant Medications   pantoprazole (PROTONIX) 40 MG tablet   Need for hepatitis C screening test       Relevant Orders   Hepatitis C antibody      Meds ordered this encounter  Medications  . metFORMIN (GLUCOPHAGE) 500 MG tablet    Sig: Take 1 tablet (500 mg total) by mouth every morning.    Dispense:  180 tablet    Refill:  3  . metFORMIN (GLUCOPHAGE) 1000 MG tablet    Sig: Take 1 tablet (1,000 mg total) by mouth 2 (two) times daily.    Dispense:  180 tablet    Refill:  3  . rosuvastatin (CRESTOR) 5 MG tablet    Sig: Take 1 tablet (5 mg total) by mouth daily.    Dispense:  90 tablet    Refill:  3  . losartan (COZAAR) 25 MG tablet    Sig: Take 1 tablet (25 mg total) by mouth daily. Reported on 02/26/2016    Dispense:  90 tablet    Refill:  3  . HUMALOG KWIKPEN 100 UNIT/ML KiwkPen    Sig: Inject 0.22 mLs (22 Units total) into the skin 3 (three) times daily.    Dispense:  15 mL    Refill:  99  . Insulin Glargine (LANTUS SOLOSTAR) 100 UNIT/ML Solostar Pen    Sig: Inject 110 Units into the skin daily at 10 pm. 60 units in am and 50 units in pm    Dispense:  30 pen    Refill:  PRN  . sertraline (ZOLOFT) 100 MG tablet    Sig: Take 1 tablet (100 mg total)  by mouth daily.    Dispense:  90 tablet    Refill:  3  . pantoprazole (PROTONIX) 40 MG tablet    Sig: Take 1 tablet (40 mg total) by mouth daily.    Dispense:  90 tablet    Refill:  3  . liraglutide (VICTOZA) 18 MG/3ML SOPN    Sig: INJECT 1.8 MLS (10.8 MG TOTAL) INTO THE SKIN ONCE DAILY    Dispense:  27 mL    Refill:   5  . Insulin Pen Needle (NOVOFINE) 32G X 6 MM MISC    Sig: USE AS DIRECTED 3 TIMES A DAY    Dispense:  300 each    Refill:  3      Dr. Deanna Goodin [facility]  08/10/17

## 2017-08-11 LAB — RENAL FUNCTION PANEL
Albumin: 4.4 g/dL (ref 3.6–4.8)
BUN/Creatinine Ratio: 31 — ABNORMAL HIGH (ref 12–28)
BUN: 29 mg/dL — AB (ref 8–27)
CALCIUM: 9.1 mg/dL (ref 8.7–10.3)
CO2: 20 mmol/L (ref 20–29)
CREATININE: 0.94 mg/dL (ref 0.57–1.00)
Chloride: 110 mmol/L — ABNORMAL HIGH (ref 96–106)
GFR calc non Af Amer: 64 mL/min/{1.73_m2} (ref >59)
GFR, EST AFRICAN AMERICAN: 74 mL/min/{1.73_m2} (ref >59)
Glucose: 104 mg/dL — ABNORMAL HIGH (ref 65–99)
Phosphorus: 4.7 mg/dL — ABNORMAL HIGH (ref 2.5–4.5)
Potassium: 5.5 mmol/L — ABNORMAL HIGH (ref 3.5–5.2)
SODIUM: 144 mmol/L (ref 134–144)

## 2017-08-11 LAB — LIPID PANEL
CHOL/HDL RATIO: 4.6 ratio — AB (ref 0.0–4.4)
CHOLESTEROL TOTAL: 142 mg/dL (ref 100–199)
HDL: 31 mg/dL — ABNORMAL LOW (ref >39)
LDL CALC: 63 mg/dL (ref 0–99)
TRIGLYCERIDES: 241 mg/dL — AB (ref 0–149)
VLDL CHOLESTEROL CAL: 48 mg/dL — AB (ref 5–40)

## 2017-08-11 LAB — MICROALBUMIN / CREATININE URINE RATIO
CREATININE, UR: 56.2 mg/dL
MICROALB/CREAT RATIO: 36.3 mg/g{creat} — AB (ref 0.0–30.0)
MICROALBUM., U, RANDOM: 20.4 ug/mL

## 2017-08-11 LAB — HEPATITIS C ANTIBODY

## 2017-08-11 LAB — HEMOGLOBIN A1C
Est. average glucose Bld gHb Est-mCnc: 131 mg/dL
HEMOGLOBIN A1C: 6.2 % — AB (ref 4.8–5.6)

## 2017-08-17 ENCOUNTER — Other Ambulatory Visit: Payer: Self-pay

## 2017-08-17 DIAGNOSIS — F4320 Adjustment disorder, unspecified: Secondary | ICD-10-CM | POA: Diagnosis not present

## 2017-09-15 NOTE — Patient Outreach (Signed)
[place] [facility]) Care Management  09/15/2017  Maria Singh Sep 02, 1951 [id number]   I have removed myself from the care team for Link to Wellness.  I will continue to assist with her diabetes  in the [facility] app through [facility].   Gentry Fitz, RN, BA, [place], [place] Direct Dial:  [phone]  Fax:  [phone] E-mail: [name] [address], [place], [place]  [address]

## 2017-09-28 DIAGNOSIS — F4320 Adjustment disorder, unspecified: Secondary | ICD-10-CM | POA: Diagnosis not present

## 2017-10-20 ENCOUNTER — Other Ambulatory Visit: Payer: Self-pay

## 2017-10-25 ENCOUNTER — Other Ambulatory Visit: Payer: Self-pay | Admitting: Family Medicine

## 2017-10-26 DIAGNOSIS — F4320 Adjustment disorder, unspecified: Secondary | ICD-10-CM | POA: Diagnosis not present

## 2017-11-09 DIAGNOSIS — F4320 Adjustment disorder, unspecified: Secondary | ICD-10-CM | POA: Diagnosis not present

## 2017-11-23 DIAGNOSIS — F4320 Adjustment disorder, unspecified: Secondary | ICD-10-CM | POA: Diagnosis not present

## 2017-12-06 ENCOUNTER — Other Ambulatory Visit: Payer: Self-pay

## 2017-12-06 DIAGNOSIS — R110 Nausea: Secondary | ICD-10-CM

## 2017-12-06 MED ORDER — ONDANSETRON HCL 4 MG PO TABS: 4.0000 mg | ORAL_TABLET | Freq: Three times a day (TID) | ORAL | 0 refills | Status: DC | PRN

## 2017-12-07 DIAGNOSIS — F4320 Adjustment disorder, unspecified: Secondary | ICD-10-CM | POA: Diagnosis not present

## 2017-12-21 DIAGNOSIS — F4320 Adjustment disorder, unspecified: Secondary | ICD-10-CM | POA: Diagnosis not present

## 2018-01-04 DIAGNOSIS — F4320 Adjustment disorder, unspecified: Secondary | ICD-10-CM | POA: Diagnosis not present

## 2018-01-05 ENCOUNTER — Other Ambulatory Visit: Payer: Self-pay | Admitting: Family Medicine

## 2018-04-04 ENCOUNTER — Other Ambulatory Visit: Payer: Self-pay | Admitting: Family Medicine

## 2018-04-04 DIAGNOSIS — R110 Nausea: Secondary | ICD-10-CM

## 2018-04-12 ENCOUNTER — Encounter: Payer: Self-pay | Admitting: Internal Medicine

## 2018-04-12 ENCOUNTER — Other Ambulatory Visit: Payer: Self-pay | Admitting: Internal Medicine

## 2018-04-13 ENCOUNTER — Ambulatory Visit (INDEPENDENT_AMBULATORY_CARE_PROVIDER_SITE_OTHER): Payer: 59 | Admitting: Internal Medicine

## 2018-04-13 ENCOUNTER — Encounter: Payer: Self-pay | Admitting: Internal Medicine

## 2018-04-13 ENCOUNTER — Encounter: Payer: 59 | Admitting: Family Medicine

## 2018-04-13 VITALS — BP 138/67 | HR 78 | Temp 98.1°F | Resp 16

## 2018-04-13 DIAGNOSIS — Z1211 Encounter for screening for malignant neoplasm of colon: Secondary | ICD-10-CM

## 2018-04-13 DIAGNOSIS — K219 Gastro-esophageal reflux disease without esophagitis: Secondary | ICD-10-CM | POA: Insufficient documentation

## 2018-04-13 DIAGNOSIS — E785 Hyperlipidemia, unspecified: Secondary | ICD-10-CM

## 2018-04-13 DIAGNOSIS — Z1231 Encounter for screening mammogram for malignant neoplasm of breast: Secondary | ICD-10-CM

## 2018-04-13 DIAGNOSIS — E1165 Type 2 diabetes mellitus with hyperglycemia: Secondary | ICD-10-CM | POA: Diagnosis not present

## 2018-04-13 DIAGNOSIS — Z23 Encounter for immunization: Secondary | ICD-10-CM | POA: Diagnosis not present

## 2018-04-13 DIAGNOSIS — M810 Age-related osteoporosis without current pathological fracture: Secondary | ICD-10-CM

## 2018-04-13 DIAGNOSIS — F329 Major depressive disorder, single episode, unspecified: Secondary | ICD-10-CM | POA: Diagnosis not present

## 2018-04-13 DIAGNOSIS — E1169 Type 2 diabetes mellitus with other specified complication: Secondary | ICD-10-CM

## 2018-04-13 DIAGNOSIS — G600 Hereditary motor and sensory neuropathy: Secondary | ICD-10-CM

## 2018-04-13 DIAGNOSIS — G4733 Obstructive sleep apnea (adult) (pediatric): Secondary | ICD-10-CM | POA: Diagnosis not present

## 2018-04-13 DIAGNOSIS — I10 Essential (primary) hypertension: Secondary | ICD-10-CM

## 2018-04-13 DIAGNOSIS — Z0000 Encounter for general adult medical examination without abnormal findings: Secondary | ICD-10-CM

## 2018-04-13 DIAGNOSIS — Z1239 Encounter for other screening for malignant neoplasm of breast: Secondary | ICD-10-CM

## 2018-04-13 LAB — POCT URINALYSIS DIPSTICK
Bilirubin, UA: NEGATIVE
Blood, UA: NEGATIVE
GLUCOSE UA: NEGATIVE
Ketones, UA: NEGATIVE
LEUKOCYTES UA: NEGATIVE
NITRITE UA: NEGATIVE
PH UA: 5 (ref 5.0–8.0)
Protein, UA: NEGATIVE
Spec Grav, UA: 1.01 (ref 1.010–1.025)
Urobilinogen, UA: 0.2 E.U./dL

## 2018-04-13 NOTE — Progress Notes (Signed)
Date:  04/13/2018   Name:  Maria Singh   DOB:  August 18, 1951   MRN:  [id number]   Chief Complaint: Annual Exam; Diabetes; Hypertension; Hyperlipidemia; and Gastroesophageal Reflux Khristine Baljit Liebert is a 67 y.o. female who presents today for her Complete Annual Exam. She feels fairly well. She reports exercising none due to medical condition. She reports she is sleeping fairly well.  She is overdue for mammogram and eye exam.  She likely can not do colonoscopy because of ambulation limitations.  She had a DEXA years ago and took Fosamax but had to stop due to kidney stones.  Diabetes  She presents for her follow-up diabetic visit. She has type 2 diabetes mellitus. The initial diagnosis of diabetes was made 20 years ago. Her disease course has been stable. Pertinent negatives for hypoglycemia include no dizziness, headaches, nervousness/anxiousness or pallor. Pertinent negatives for diabetes include no chest pain and no fatigue. There are no hypoglycemic complications. Symptoms are stable. Current diabetic treatment includes insulin injections and oral agent (monotherapy) (and Victoza + metformin). She is compliant with treatment all of the time. She monitors blood glucose at home 1-2 x per day. Her breakfast blood glucose is taken between 6-7 am. Her breakfast blood glucose range is generally 90-110 mg/dl. An ACE inhibitor/angiotensin II receptor blocker is being taken. Eye exam is current.  Hypertension  This is a chronic problem. The problem is controlled. Pertinent negatives include no chest pain, headaches, neck pain, palpitations or shortness of breath. Past treatments include angiotensin blockers. The current treatment provides significant improvement.  Hyperlipidemia  This is a chronic problem. The problem is controlled. Pertinent negatives include no chest pain or shortness of breath. Current antihyperlipidemic treatment includes statins. The current treatment provides significant  improvement of lipids.  Gastroesophageal Reflux  She complains of heartburn. She reports no chest pain, no coughing or no wheezing. This is a chronic problem. Pertinent negatives include no fatigue. Risk factors: gastritis? several years ago. She has tried a PPI for the symptoms. The treatment provided moderate relief.  Depression         This is a chronic problem.  The problem has been resolved since onset.  Associated symptoms include no fatigue and no headaches.  Past treatments include SSRIs - Selective serotonin reuptake inhibitors.  Compliance with treatment is good.  Previous treatment provided significant relief. OSA - using Bipap auto with nasal pillows but has habit of sleeping with mouth open.  She does not feel that she rests as well but denies morning headaches or significant daytime somnolence.  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 Uses an electric wheelchair and drives a handicapped equipped vehicle.  She can transfer independently to toilet and bed but needs assistance with bathing.  She has noticed that her shoulders are more limited in strength. She is in the process of remodeling part of her kitchen so that she can continue to cook safely.  Lab Results  Component Value Date   HGBA1C 6.2 (H) 08/10/2017   Lab Results  Component Value Date   CHOL 142 08/10/2017   HDL 31 (L) 08/10/2017   LDLCALC 63 08/10/2017   TRIG 241 (H) 08/10/2017   CHOLHDL 4.6 (H) 08/10/2017   Lab Results  Component Value Date   CREATININE 0.94 08/10/2017   BUN 29 (H) 08/10/2017   NA 144 08/10/2017   K 5.5 (H) 08/10/2017   CL 110 (H) 08/10/2017   CO2 20 08/10/2017   Lab  Results  Component Value Date   TSH 3.440 05/21/2015    Review of Systems  Constitutional: Negative for chills, fatigue and fever.  HENT: Negative for trouble swallowing and voice change.   Eyes: Negative for visual disturbance.  Respiratory: Positive for apnea. Negative for cough, chest tightness,  shortness of breath and wheezing.   Cardiovascular: Positive for leg swelling (mild ankle edema at times). Negative for chest pain and palpitations.  Gastrointestinal: Positive for abdominal distention and heartburn. Negative for blood in stool, constipation and diarrhea.  Genitourinary: Negative for dysuria.  Musculoskeletal: Positive for arthralgias and gait problem. Negative for joint swelling and neck pain.  Skin: Negative for pallor and rash.  Neurological: Negative for dizziness and headaches.  Hematological: Negative for adenopathy.  Psychiatric/Behavioral: Positive for depression and sleep disturbance. The patient is not nervous/anxious.     Patient Active Problem List   Diagnosis Date Noted  . GERD (gastroesophageal reflux disease) 04/13/2018  . Major depression, chronic 08/10/2017  . Hyperlipidemia associated with type 2 diabetes mellitus ([facility]) 08/10/2017  . Essential hypertension 08/10/2017  . 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 Pain in limb 10/30/2016  . Swelling of limb 10/30/2016  . Diabetes type 2, uncontrolled ([facility]) 06/19/2015    Prior to Admission medications   Medication Sig Start Date End Date Taking? Authorizing Provider  ACCU-CHEK GUIDE test strip CHECK BLOOD SUGAR TWO TIMES DAILY 01/05/18   Juline Patch, MD  acetaminophen (TYLENOL) 500 MG tablet Take 500 mg by mouth every 6 (six) hours as needed.    [provider]  aspirin 81 MG tablet Take 81 mg by mouth daily.    [provider]  BAYER MICROLET LANCETS lancets USE AS DIRECTED THREE TIMES A DAY 08/10/16   Juline Patch, MD  clotrimazole-betamethasone (LOTRISONE) cream Apply 1 application topically 2 (two) times daily. 01/06/17   Juline Patch, MD  diclofenac sodium (VOLTAREN) 1 % GEL Apply to area BID PRN 12/27/15   Juline Patch, MD  docusate sodium (COLACE) 100 MG capsule Take 100 mg by mouth daily.     [provider]  EPINEPHrine 0.3 mg/0.3 mL IJ SOAJ injection Inject 0.3  mLs (0.3 mg total) into the muscle once. 06/05/15   Juline Patch, MD  HUMALOG KWIKPEN 100 UNIT/ML KiwkPen Inject 0.22 mLs (22 Units total) into the skin 3 (three) times daily. 08/10/17   Juline Patch, MD  ibuprofen (ADVIL,MOTRIN) 800 MG tablet TAKE ONE TABLET BY MOUTH THREE TIMES A DAY 01/01/17   Juline Patch, MD  Insulin Glargine (LANTUS SOLOSTAR) 100 UNIT/ML Solostar Pen Inject 110 Units into the skin daily at 10 pm. 60 units in am and 50 units in pm 08/10/17   Juline Patch, MD  Insulin Pen Needle (NOVOFINE) 32G X 6 MM MISC USE AS DIRECTED 3 TIMES A DAY 08/10/17   Otilio Miu C, MD  liraglutide (VICTOZA) 18 MG/3ML SOPN INJECT 1.8 MLS (10.8 MG TOTAL) INTO THE SKIN ONCE DAILY 08/10/17   Juline Patch, MD  losartan (COZAAR) 25 MG tablet Take 1 tablet (25 mg total) by mouth daily. Reported on 02/26/2016 08/10/17   Juline Patch, MD  metFORMIN (GLUCOPHAGE) 1000 MG tablet Take 1 tablet (1,000 mg total) by mouth 2 (two) times daily. 08/10/17   Juline Patch, MD  metFORMIN (GLUCOPHAGE) 500 MG tablet Take 1 tablet (500 mg total) by mouth every morning. 08/10/17   Juline Patch, MD  mupirocin ointment (BACTROBAN) 2 % APPLY TO  AFFECTED AREA(S) AS NEEDED 10/25/17   Juline Patch, MD  nystatin cream (MYCOSTATIN) Apply 1 application topically 2 (two) times daily. 06/09/17   Juline Patch, MD  ondansetron (ZOFRAN) 4 MG tablet TAKE 1 TABLET BY MOUTH EVERY 8 HOURS AS NEEDED FOR NAUSEA OR VOMITING. 04/04/18   Juline Patch, MD  pantoprazole (PROTONIX) 40 MG tablet Take 1 tablet (40 mg total) by mouth daily. 08/10/17   Juline Patch, MD  rosuvastatin (CRESTOR) 5 MG tablet Take 1 tablet (5 mg total) by mouth daily. 08/10/17   Juline Patch, MD  sertraline (ZOLOFT) 100 MG tablet Take 1 tablet (100 mg total) by mouth daily. 08/10/17   Juline Patch, MD  triamcinolone cream (KENALOG) 0.5 % Apply 1 application topically 3 (three) times daily. 07/09/15   Juline Patch, MD    Allergies    Allergen Reactions  . Sulfa Antibiotics Rash    Past Surgical History:  Procedure Laterality Date  . arm surgery Left    broken arm  . HERNIA REPAIR      Social History   Tobacco Use  . Smoking status: Never Smoker  . Smokeless tobacco: Never Used  Substance Use Topics  . Alcohol use: Yes    Alcohol/week: 0.0 oz    Comment: very very rare  . Drug use: No     Medication list has been reviewed and updated.  Current Meds  Medication Sig  . ACCU-CHEK GUIDE test strip CHECK BLOOD SUGAR TWO TIMES DAILY  . acetaminophen (TYLENOL) 500 MG tablet Take 500 mg by mouth every 6 (six) hours as needed.  Marland Kitchen aspirin 81 MG tablet Take 81 mg by mouth daily.  Marland Kitchen BAYER MICROLET LANCETS lancets USE AS DIRECTED THREE TIMES A DAY  . clotrimazole-betamethasone (LOTRISONE) cream Apply 1 application topically 2 (two) times daily.  Marland Kitchen docusate sodium (COLACE) 100 MG capsule Take 100 mg by mouth daily.   Marland Kitchen EPINEPHrine 0.3 mg/0.3 mL IJ SOAJ injection Inject 0.3 mLs (0.3 mg total) into the muscle once.  Marland Kitchen HUMALOG KWIKPEN 100 UNIT/ML KiwkPen Inject 0.22 mLs (22 Units total) into the skin 3 (three) times daily.  Marland Kitchen ibuprofen (ADVIL,MOTRIN) 800 MG tablet TAKE ONE TABLET BY MOUTH THREE TIMES A DAY  . Insulin Glargine (LANTUS SOLOSTAR) 100 UNIT/ML Solostar Pen Inject 110 Units into the skin daily at 10 pm. 60 units in am and 50 units in pm (Patient taking differently: Inject 110 Units into the skin daily at 10 pm. 60 units in pm and 50 units in am)  . Insulin Pen Needle (NOVOFINE) 32G X 6 MM MISC USE AS DIRECTED 3 TIMES A DAY  . liraglutide (VICTOZA) 18 MG/3ML SOPN INJECT 1.8 MLS (10.8 MG TOTAL) INTO THE SKIN ONCE DAILY  . loratadine (CLARITIN) 10 MG tablet Take 10 mg by mouth daily.  Marland Kitchen losartan (COZAAR) 25 MG tablet Take 1 tablet (25 mg total) by mouth daily. Reported on 02/26/2016  . metFORMIN (GLUCOPHAGE) 1000 MG tablet Take 1 tablet (1,000 mg total) by mouth 2 (two) times daily.  . metFORMIN (GLUCOPHAGE) 500  MG tablet Take 1 tablet (500 mg total) by mouth every morning.  . mupirocin ointment (BACTROBAN) 2 % APPLY TO AFFECTED AREA(S) AS NEEDED  . NON FORMULARY Auto BiPap nightly  . nystatin cream (MYCOSTATIN) Apply 1 application topically 2 (two) times daily.  . ondansetron (ZOFRAN) 4 MG tablet TAKE 1 TABLET BY MOUTH EVERY 8 HOURS AS NEEDED FOR NAUSEA OR VOMITING.  . pantoprazole (  PROTONIX) 40 MG tablet Take 1 tablet (40 mg total) by mouth daily.  . rosuvastatin (CRESTOR) 5 MG tablet Take 1 tablet (5 mg total) by mouth daily.  . sertraline (ZOLOFT) 100 MG tablet Take 1 tablet (100 mg total) by mouth daily.  Marland Kitchen triamcinolone cream (KENALOG) 0.5 % Apply 1 application topically 3 (three) times daily.    PHQ 2/9 Scores 04/13/2018 07/01/2016 02/26/2016 08/28/2015  PHQ - 2 Score 0 0 0 0  PHQ- 9 Score 0 - - -    Physical Exam  Constitutional: She is oriented to person, place, and time. She appears well-developed and well-nourished. No distress.  HENT:  Head: Normocephalic and atraumatic.  Right Ear: Tympanic membrane and ear canal normal.  Left Ear: Tympanic membrane and ear canal normal.  Nose: Right sinus exhibits no maxillary sinus tenderness. Left sinus exhibits no maxillary sinus tenderness.  Mouth/Throat: Uvula is midline and oropharynx is clear and moist.  Eyes: Conjunctivae and EOM are normal. Right eye exhibits no discharge. Left eye exhibits no discharge. No scleral icterus.  Neck: Normal range of motion. Carotid bruit is not present. No erythema present. No thyromegaly present.  Cardiovascular: Normal rate, regular rhythm, normal heart sounds and normal pulses.  Pulmonary/Chest: Effort normal. No respiratory distress. She has no wheezes.  Abdominal: Soft. Bowel sounds are normal. There is no hepatosplenomegaly. There is no tenderness. There is no CVA tenderness.  Musculoskeletal: Normal range of motion.       Right shoulder: She exhibits normal range of motion and no tenderness.       Left  shoulder: She exhibits normal range of motion and no tenderness.       Right knee: She exhibits no swelling.       Left knee: She exhibits no swelling and no effusion.  Lymphadenopathy:    She has no cervical adenopathy.    She has no axillary adenopathy.  Neurological: She is alert and oriented to person, place, and time. She has normal reflexes. No cranial nerve deficit or sensory deficit.  Skin: Skin is warm, dry and intact. No rash noted.  Few benign appearing AK or SK on arms  Psychiatric: She has a normal mood and affect. Her speech is normal and behavior is normal. Judgment and thought content normal. Cognition and memory are normal.  Nursing note and vitals reviewed.   BP 138/67   Pulse 78   Temp 98.1 F (36.7 C) (Oral)   Resp 16   SpO2 97%   Assessment and Plan: 1. Annual physical exam Normal exam for age and condition  2. Breast cancer screening Pt will continue self breast exams - MM DIGITAL SCREENING BILATERAL; Future  3. Essential hypertension controlled - POCT urinalysis dipstick - CBC with Differential/Platelet - Comprehensive metabolic panel  4. Uncontrolled type 2 diabetes mellitus with hyperglycemia ([facility]CC) Doing well on Lantus and Victoza Pt to schedule diabetic eye exam - Comprehensive metabolic panel - Hemoglobin A1c - TSH  5. 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 Major depression, chronic Doing well on zoloft - will continue  8. Gastroesophageal reflux disease without esophagitis Controlled on PPI - CBC with Differential/Platelet  9. OSA treated with BiPAP Needs new machine - will likely need new sleep study - Nocturnal polysomnography (NPSG); Future  10. Need for pneumococcal vaccination - Pneumococcal conjugate vaccine 13-valent IM  11. Osteoporosis, unspecified  osteoporosis  type, unspecified pathological fracture presence Repeat DEXA - DG Bone Density; Future  12. Colon cancer screening Will get FIT - if positive, consider Cologuard - Fecal occult blood, imunochemical   No orders of the defined types were placed in this encounter.   Partially dictated using Editor, commissioning. Any errors are unintentional.  Halina Maidens, MD [facility]  04/13/2018

## 2018-04-13 NOTE — Patient Instructions (Signed)
Pneumococcal Conjugate Vaccine (PCV13) What You Need to Know 1. Why get vaccinated? Vaccination can protect both children and adults from pneumococcal disease. Pneumococcal disease is caused by bacteria that can spread from person to person through close contact. It can cause ear infections, and it can also lead to more serious infections of the:  Lungs (pneumonia),  Blood (bacteremia), and  Covering of the brain and spinal cord (meningitis).  Pneumococcal pneumonia is most common among adults. Pneumococcal meningitis can cause deafness and brain damage, and it kills about 1 child in 10 who get it. Anyone can get pneumococcal disease, but children under 2 years of age and adults 65 years and older, people with certain medical conditions, and cigarette smokers are at the highest risk. Before there was a vaccine, the [place] saw:  more than 700 cases of meningitis,  about 13,000 blood infections,  about 5 million ear infections, and  about 200 deaths  in children under 5 each year from pneumococcal disease. Since vaccine became available, severe pneumococcal disease in these children has fallen by 88%. About 18,000 older adults die of pneumococcal disease each year in the [place]. Treatment of pneumococcal infections with penicillin and other drugs is not as effective as it used to be, because some strains of the disease have become resistant to these drugs. This makes prevention of the disease, through vaccination, even more important. 2. PCV13 vaccine Pneumococcal conjugate vaccine (called PCV13) protects against 13 types of pneumococcal bacteria. PCV13 is routinely given to children at 2, 4, 6, and 12-15 months of age. It is also recommended for children and adults 2 to 64 years of age with certain health conditions, and for all adults 65 years of age and older. Your doctor can give you details. 3. Some people should not get this vaccine Anyone who has ever had a  life-threatening allergic reaction to a dose of this vaccine, to an earlier pneumococcal vaccine called PCV7, or to any vaccine containing diphtheria toxoid (for example, DTaP), should not get PCV13. Anyone with a severe allergy to any component of PCV13 should not get the vaccine. Tell your doctor if the person being vaccinated has any severe allergies. If the person scheduled for vaccination is not feeling well, your healthcare provider might decide to reschedule the shot on another day. 4. Risks of a vaccine reaction With any medicine, including vaccines, there is a chance of reactions. These are usually mild and go away on their own, but serious reactions are also possible. Problems reported following PCV13 varied by age and dose in the series. The most common problems reported among children were:  About half became drowsy after the shot, had a temporary loss of appetite, or had redness or tenderness where the shot was given.  About 1 out of 3 had swelling where the shot was given.  About 1 out of 3 had a mild fever, and about 1 in 20 had a fever over 102.2F.  Up to about 8 out of 10 became fussy or irritable.  Adults have reported pain, redness, and swelling where the shot was given; also mild fever, fatigue, headache, chills, or muscle pain. Young children who get PCV13 along with inactivated flu vaccine at the same time may be at increased risk for seizures caused by fever. Ask your doctor for more information. Problems that could happen after any vaccine:  People sometimes faint after a medical procedure, including vaccination. Sitting or lying down for about 15 minutes can help prevent   fainting, and injuries caused by a fall. Tell your doctor if you feel dizzy, or have vision changes or ringing in the ears.  Some older children and adults get severe pain in the shoulder and have difficulty moving the arm where a shot was given. This happens very rarely.  Any medication can cause a  severe allergic reaction. Such reactions from a vaccine are very rare, estimated at about 1 in a million doses, and would happen within a few minutes to a few hours after the vaccination. As with any medicine, there is a very small chance of a vaccine causing a serious injury or death. The safety of vaccines is always being monitored. For more information, visit: www.cdc.gov/vaccinesafety/ 5. What if there is a serious reaction? What should I look for? Look for anything that concerns you, such as signs of a severe allergic reaction, very high fever, or unusual behavior. Signs of a severe allergic reaction can include hives, swelling of the face and throat, difficulty breathing, a fast heartbeat, dizziness, and weakness-usually within a few minutes to a few hours after the vaccination. What should I do?  If you think it is a severe allergic reaction or other emergency that can't wait, call 9-1-1 or get the person to the nearest hospital. Otherwise, call your doctor.  Reactions should be reported to the Vaccine Adverse Event Reporting System (VAERS). Your doctor should file this report, or you can do it yourself through the VAERS web site at www.vaers.hhs.gov, or by calling 1-800-822-7967. ? VAERS does not give medical advice. 6. The National Vaccine Injury Compensation Program The National Vaccine Injury Compensation Program (VICP) is a federal program that was created to compensate people who may have been injured by certain vaccines. Persons who believe they may have been injured by a vaccine can learn about the program and about filing a claim by calling 1-800-338-2382 or visiting the VICP website at www.hrsa.gov/vaccinecompensation. There is a time limit to file a claim for compensation. 7. How can I learn more?  Ask your healthcare provider. He or she can give you the vaccine package insert or suggest other sources of information.  Call your local or state health department.  Contact the  Centers for Disease Control and Prevention (CDC): ? Call 1-800-232-4636 (1-800-CDC-INFO) or ? Visit CDC's website at www.cdc.gov/vaccines Vaccine Information Statement, PCV13 Vaccine (08/23/2014) This information is not intended to replace advice given to you by your health care provider. Make sure you discuss any questions you have with your health care provider. Document Released: 08/02/2006 Document Revised: 06/25/2016 Document Reviewed: 06/25/2016 Elsevier Interactive Patient Education  2017 [employer]  

## 2018-04-14 ENCOUNTER — Other Ambulatory Visit: Payer: Self-pay

## 2018-04-14 DIAGNOSIS — B373 Candidiasis of vulva and vagina: Secondary | ICD-10-CM

## 2018-04-14 DIAGNOSIS — B3731 Acute candidiasis of vulva and vagina: Secondary | ICD-10-CM

## 2018-04-14 MED ORDER — FLUCONAZOLE 150 MG PO TABS: 150.0000 mg | ORAL_TABLET | Freq: Once | ORAL | 0 refills | Status: AC

## 2018-04-15 DIAGNOSIS — E785 Hyperlipidemia, unspecified: Secondary | ICD-10-CM | POA: Diagnosis not present

## 2018-04-15 DIAGNOSIS — E1165 Type 2 diabetes mellitus with hyperglycemia: Secondary | ICD-10-CM | POA: Diagnosis not present

## 2018-04-15 DIAGNOSIS — K219 Gastro-esophageal reflux disease without esophagitis: Secondary | ICD-10-CM | POA: Diagnosis not present

## 2018-04-15 DIAGNOSIS — D649 Anemia, unspecified: Secondary | ICD-10-CM | POA: Diagnosis not present

## 2018-04-15 DIAGNOSIS — I10 Essential (primary) hypertension: Secondary | ICD-10-CM | POA: Diagnosis not present

## 2018-04-15 DIAGNOSIS — E1169 Type 2 diabetes mellitus with other specified complication: Secondary | ICD-10-CM | POA: Diagnosis not present

## 2018-04-16 LAB — CBC WITH DIFFERENTIAL/PLATELET
BASOS: 0 %
Basophils Absolute: 0 10*3/uL (ref 0.0–0.2)
EOS (ABSOLUTE): 0.6 10*3/uL — ABNORMAL HIGH (ref 0.0–0.4)
Eos: 8 %
HEMOGLOBIN: 7.6 g/dL — AB (ref 11.1–15.9)
Hematocrit: 26.5 % — ABNORMAL LOW (ref 34.0–46.6)
IMMATURE GRANS (ABS): 0 10*3/uL (ref 0.0–0.1)
IMMATURE GRANULOCYTES: 0 %
LYMPHS: 24 %
Lymphocytes Absolute: 1.8 10*3/uL (ref 0.7–3.1)
MCH: 20.4 pg — ABNORMAL LOW (ref 26.6–33.0)
MCHC: 28.7 g/dL — ABNORMAL LOW (ref 31.5–35.7)
MCV: 71 fL — AB (ref 79–97)
MONOCYTES: 7 %
Monocytes Absolute: 0.6 10*3/uL (ref 0.1–0.9)
NEUTROS ABS: 4.6 10*3/uL (ref 1.4–7.0)
NEUTROS PCT: 61 %
Platelets: 229 10*3/uL (ref 150–450)
RBC: 3.72 x10E6/uL — ABNORMAL LOW (ref 3.77–5.28)
RDW: 19 % — ABNORMAL HIGH (ref 12.3–15.4)
WBC: 7.6 10*3/uL (ref 3.4–10.8)

## 2018-04-16 LAB — LIPID PANEL
CHOL/HDL RATIO: 4.8 ratio — AB (ref 0.0–4.4)
CHOLESTEROL TOTAL: 135 mg/dL (ref 100–199)
HDL: 28 mg/dL — AB (ref >39)
LDL CALC: 67 mg/dL (ref 0–99)
TRIGLYCERIDES: 201 mg/dL — AB (ref 0–149)
VLDL CHOLESTEROL CAL: 40 mg/dL (ref 5–40)

## 2018-04-16 LAB — HEMOGLOBIN A1C
Est. average glucose Bld gHb Est-mCnc: 128 mg/dL
Hgb A1c MFr Bld: 6.1 % — ABNORMAL HIGH (ref 4.8–5.6)

## 2018-04-16 LAB — COMPREHENSIVE METABOLIC PANEL
ALBUMIN: 4.1 g/dL (ref 3.6–4.8)
ALT: 26 IU/L (ref 0–32)
AST: 27 IU/L (ref 0–40)
Albumin/Globulin Ratio: 1.6 (ref 1.2–2.2)
Alkaline Phosphatase: 25 IU/L — ABNORMAL LOW (ref 39–117)
BUN / CREAT RATIO: 30 — AB (ref 12–28)
BUN: 29 mg/dL — AB (ref 8–27)
Bilirubin Total: <0.2 mg/dL (ref 0.0–1.2)
CALCIUM: 8.8 mg/dL (ref 8.7–10.3)
CO2: 19 mmol/L — ABNORMAL LOW (ref 20–29)
Chloride: 109 mmol/L — ABNORMAL HIGH (ref 96–106)
Creatinine, Ser: 0.96 mg/dL (ref 0.57–1.00)
GFR, EST AFRICAN AMERICAN: 72 mL/min/{1.73_m2} (ref >59)
GFR, EST NON AFRICAN AMERICAN: 62 mL/min/{1.73_m2} (ref >59)
GLUCOSE: 67 mg/dL (ref 65–99)
Globulin, Total: 2.5 g/dL (ref 1.5–4.5)
Potassium: 5.9 mmol/L — ABNORMAL HIGH (ref 3.5–5.2)
Sodium: 142 mmol/L (ref 134–144)
TOTAL PROTEIN: 6.6 g/dL (ref 6.0–8.5)

## 2018-04-16 LAB — TSH: TSH: 4.02 u[IU]/mL (ref 0.450–4.500)

## 2018-04-21 DIAGNOSIS — Z1211 Encounter for screening for malignant neoplasm of colon: Secondary | ICD-10-CM | POA: Diagnosis not present

## 2018-04-21 LAB — FOLATE: Folate: 7.5 ng/mL (ref >3.0)

## 2018-04-21 LAB — SPECIMEN STATUS REPORT

## 2018-04-21 LAB — IRON AND TIBC
IRON SATURATION: 5 % — AB (ref 15–55)
IRON: 24 ug/dL — AB (ref 27–139)
TIBC: 453 ug/dL — AB (ref 250–450)
UIBC: 429 ug/dL — AB (ref 118–369)

## 2018-04-21 LAB — FERRITIN: Ferritin: 8 ng/mL — ABNORMAL LOW (ref 15–150)

## 2018-04-26 ENCOUNTER — Other Ambulatory Visit: Payer: Self-pay

## 2018-04-26 DIAGNOSIS — F4320 Adjustment disorder, unspecified: Secondary | ICD-10-CM | POA: Diagnosis not present

## 2018-04-26 DIAGNOSIS — B372 Candidiasis of skin and nail: Secondary | ICD-10-CM

## 2018-04-26 DIAGNOSIS — E109 Type 1 diabetes mellitus without complications: Secondary | ICD-10-CM

## 2018-04-26 DIAGNOSIS — R21 Rash and other nonspecific skin eruption: Secondary | ICD-10-CM

## 2018-04-26 DIAGNOSIS — D649 Anemia, unspecified: Secondary | ICD-10-CM

## 2018-04-26 DIAGNOSIS — E1165 Type 2 diabetes mellitus with hyperglycemia: Secondary | ICD-10-CM

## 2018-04-26 MED ORDER — FERROUS SULFATE 325 (65 FE) MG PO TABS
325.0000 mg | ORAL_TABLET | Freq: Every day | ORAL | 1 refills | Status: DC
Start: 2018-04-26 — End: 2024-03-08

## 2018-04-26 MED ORDER — CLOTRIMAZOLE-BETAMETHASONE 1-0.05 % EX CREA: 1.0000 "application " | TOPICAL_CREAM | Freq: Two times a day (BID) | CUTANEOUS | 1 refills | Status: DC

## 2018-04-26 MED ORDER — LIRAGLUTIDE 18 MG/3ML ~~LOC~~ SOPN: PEN_INJECTOR | SUBCUTANEOUS | 5 refills | Status: DC

## 2018-04-26 MED ORDER — FLUCONAZOLE 150 MG PO TABS: 150.0000 mg | ORAL_TABLET | Freq: Once | ORAL | 0 refills | Status: DC

## 2018-04-27 LAB — FECAL OCCULT BLOOD, IMMUNOCHEMICAL: Fecal Occult Bld: NEGATIVE

## 2018-05-03 ENCOUNTER — Other Ambulatory Visit: Payer: Self-pay | Admitting: Internal Medicine

## 2018-05-03 MED ORDER — RANITIDINE HCL 150 MG PO TABS: 150.0000 mg | ORAL_TABLET | Freq: Two times a day (BID) | ORAL | 1 refills | Status: DC

## 2018-05-04 ENCOUNTER — Ambulatory Visit
Admission: RE | Admit: 2018-05-04 | Discharge: 2018-05-04 | Disposition: A | Discharge status: Home / Self Care | Payer: 59 | Source: Ambulatory Visit | Attending: Internal Medicine | Admitting: Internal Medicine

## 2018-05-04 ENCOUNTER — Encounter (INDEPENDENT_AMBULATORY_CARE_PROVIDER_SITE_OTHER): Payer: Self-pay

## 2018-05-04 DIAGNOSIS — M810 Age-related osteoporosis without current pathological fracture: Secondary | ICD-10-CM | POA: Diagnosis not present

## 2018-05-04 DIAGNOSIS — Z780 Asymptomatic menopausal state: Secondary | ICD-10-CM | POA: Diagnosis not present

## 2018-05-04 DIAGNOSIS — Z0000 Encounter for general adult medical examination without abnormal findings: Secondary | ICD-10-CM | POA: Diagnosis not present

## 2018-05-04 DIAGNOSIS — Z1231 Encounter for screening mammogram for malignant neoplasm of breast: Secondary | ICD-10-CM | POA: Diagnosis not present

## 2018-05-04 DIAGNOSIS — Z1239 Encounter for other screening for malignant neoplasm of breast: Secondary | ICD-10-CM

## 2018-05-10 DIAGNOSIS — F4320 Adjustment disorder, unspecified: Secondary | ICD-10-CM | POA: Diagnosis not present

## 2018-05-24 DIAGNOSIS — F4320 Adjustment disorder, unspecified: Secondary | ICD-10-CM | POA: Diagnosis not present

## 2018-05-25 ENCOUNTER — Other Ambulatory Visit: Payer: Self-pay

## 2018-05-25 DIAGNOSIS — L989 Disorder of the skin and subcutaneous tissue, unspecified: Secondary | ICD-10-CM

## 2018-05-25 MED ORDER — LIDOCAINE 0.5 % EX GEL: 1.0000 "application " | Freq: Two times a day (BID) | CUTANEOUS | 0 refills | Status: DC

## 2018-06-07 DIAGNOSIS — F4320 Adjustment disorder, unspecified: Secondary | ICD-10-CM | POA: Diagnosis not present

## 2018-06-21 DIAGNOSIS — F4320 Adjustment disorder, unspecified: Secondary | ICD-10-CM | POA: Diagnosis not present

## 2018-06-30 ENCOUNTER — Other Ambulatory Visit: Payer: Self-pay | Admitting: Family Medicine

## 2018-07-01 ENCOUNTER — Other Ambulatory Visit: Payer: Self-pay | Admitting: Family Medicine

## 2018-07-01 DIAGNOSIS — B372 Candidiasis of skin and nail: Secondary | ICD-10-CM

## 2018-07-05 DIAGNOSIS — F4320 Adjustment disorder, unspecified: Secondary | ICD-10-CM | POA: Diagnosis not present

## 2018-07-11 ENCOUNTER — Other Ambulatory Visit: Payer: Self-pay | Admitting: Family Medicine

## 2018-07-16 ENCOUNTER — Observation Stay
Admission: EM | Admit: 2018-07-16 | Discharge: 2018-07-17 | Disposition: A | Discharge status: Home / Self Care | Payer: 59 | Attending: Internal Medicine | Admitting: Internal Medicine

## 2018-07-16 ENCOUNTER — Other Ambulatory Visit: Payer: Self-pay

## 2018-07-16 ENCOUNTER — Emergency Department: Payer: 59

## 2018-07-16 ENCOUNTER — Encounter: Payer: Self-pay | Admitting: Emergency Medicine

## 2018-07-16 DIAGNOSIS — L03115 Cellulitis of right lower limb: Principal | ICD-10-CM | POA: Insufficient documentation

## 2018-07-16 DIAGNOSIS — G7100 Muscular dystrophy, unspecified: Secondary | ICD-10-CM | POA: Insufficient documentation

## 2018-07-16 DIAGNOSIS — E872 Acidosis: Secondary | ICD-10-CM | POA: Insufficient documentation

## 2018-07-16 DIAGNOSIS — G4730 Sleep apnea, unspecified: Secondary | ICD-10-CM | POA: Diagnosis not present

## 2018-07-16 DIAGNOSIS — W5503XA Scratched by cat, initial encounter: Secondary | ICD-10-CM | POA: Diagnosis not present

## 2018-07-16 DIAGNOSIS — K219 Gastro-esophageal reflux disease without esophagitis: Secondary | ICD-10-CM | POA: Insufficient documentation

## 2018-07-16 DIAGNOSIS — F329 Major depressive disorder, single episode, unspecified: Secondary | ICD-10-CM | POA: Diagnosis not present

## 2018-07-16 DIAGNOSIS — I10 Essential (primary) hypertension: Secondary | ICD-10-CM | POA: Insufficient documentation

## 2018-07-16 DIAGNOSIS — G600 Hereditary motor and sensory neuropathy: Secondary | ICD-10-CM | POA: Insufficient documentation

## 2018-07-16 DIAGNOSIS — E785 Hyperlipidemia, unspecified: Secondary | ICD-10-CM | POA: Insufficient documentation

## 2018-07-16 DIAGNOSIS — A419 Sepsis, unspecified organism: Secondary | ICD-10-CM | POA: Insufficient documentation

## 2018-07-16 DIAGNOSIS — Z7982 Long term (current) use of aspirin: Secondary | ICD-10-CM | POA: Insufficient documentation

## 2018-07-16 DIAGNOSIS — L0390 Cellulitis, unspecified: Secondary | ICD-10-CM | POA: Diagnosis not present

## 2018-07-16 DIAGNOSIS — Z794 Long term (current) use of insulin: Secondary | ICD-10-CM | POA: Insufficient documentation

## 2018-07-16 DIAGNOSIS — R509 Fever, unspecified: Secondary | ICD-10-CM | POA: Diagnosis not present

## 2018-07-16 DIAGNOSIS — E875 Hyperkalemia: Secondary | ICD-10-CM | POA: Diagnosis not present

## 2018-07-16 DIAGNOSIS — Z79899 Other long term (current) drug therapy: Secondary | ICD-10-CM | POA: Diagnosis not present

## 2018-07-16 DIAGNOSIS — S80811A Abrasion, right lower leg, initial encounter: Secondary | ICD-10-CM | POA: Diagnosis not present

## 2018-07-16 DIAGNOSIS — D649 Anemia, unspecified: Secondary | ICD-10-CM | POA: Insufficient documentation

## 2018-07-16 DIAGNOSIS — E1165 Type 2 diabetes mellitus with hyperglycemia: Secondary | ICD-10-CM | POA: Diagnosis not present

## 2018-07-16 DIAGNOSIS — Z791 Long term (current) use of non-steroidal anti-inflammatories (NSAID): Secondary | ICD-10-CM | POA: Insufficient documentation

## 2018-07-16 DIAGNOSIS — Z993 Dependence on wheelchair: Secondary | ICD-10-CM | POA: Diagnosis not present

## 2018-07-16 DIAGNOSIS — R0989 Other specified symptoms and signs involving the circulatory and respiratory systems: Secondary | ICD-10-CM | POA: Diagnosis not present

## 2018-07-16 HISTORY — DX: Sepsis, unspecified organism: A41.9

## 2018-07-16 LAB — CBC WITH DIFFERENTIAL/PLATELET
BASOS ABS: 0 10*3/uL (ref 0–0.1)
BASOS PCT: 1 %
EOS ABS: 0.3 10*3/uL (ref 0–0.7)
Eosinophils Relative: 4 %
HEMATOCRIT: 28.2 % — AB (ref 35.0–47.0)
HEMOGLOBIN: 9.3 g/dL — AB (ref 12.0–16.0)
Lymphocytes Relative: 4 %
Lymphs Abs: 0.3 10*3/uL — ABNORMAL LOW (ref 1.0–3.6)
MCH: 24.3 pg — ABNORMAL LOW (ref 26.0–34.0)
MCHC: 32.9 g/dL (ref 32.0–36.0)
MCV: 73.9 fL — ABNORMAL LOW (ref 80.0–100.0)
Monocytes Absolute: 0.5 10*3/uL (ref 0.2–0.9)
Monocytes Relative: 8 %
NEUTROS ABS: 5.7 10*3/uL (ref 1.4–6.5)
NEUTROS PCT: 83 %
Platelets: 201 10*3/uL (ref 150–440)
RBC: 3.81 MIL/uL (ref 3.80–5.20)
RDW: 22.5 % — AB (ref 11.5–14.5)
WBC: 6.7 10*3/uL (ref 3.6–11.0)

## 2018-07-16 LAB — GLUCOSE, CAPILLARY
GLUCOSE-CAPILLARY: 111 mg/dL — AB (ref 70–99)
Glucose-Capillary: 85 mg/dL (ref 70–99)
Glucose-Capillary: 99 mg/dL (ref 70–99)

## 2018-07-16 LAB — URINALYSIS, COMPLETE (UACMP) WITH MICROSCOPIC
BACTERIA UA: NONE SEEN
Bilirubin Urine: NEGATIVE
GLUCOSE, UA: NEGATIVE mg/dL
Hgb urine dipstick: NEGATIVE
Ketones, ur: NEGATIVE mg/dL
Leukocytes, UA: NEGATIVE
Nitrite: NEGATIVE
PROTEIN: NEGATIVE mg/dL
SQUAMOUS EPITHELIAL / LPF: NONE SEEN (ref 0–5)
Specific Gravity, Urine: 1.01 (ref 1.005–1.030)
WBC, UA: NONE SEEN WBC/hpf (ref 0–5)
pH: 5 (ref 5.0–8.0)

## 2018-07-16 LAB — PROTIME-INR
INR: 1.01
PROTHROMBIN TIME: 13.2 s (ref 11.4–15.2)

## 2018-07-16 LAB — COMPREHENSIVE METABOLIC PANEL
ALT: 30 U/L (ref 0–44)
ANION GAP: 9 (ref 5–15)
AST: 33 U/L (ref 15–41)
Albumin: 3.9 g/dL (ref 3.5–5.0)
Alkaline Phosphatase: 27 U/L — ABNORMAL LOW (ref 38–126)
BUN: 23 mg/dL (ref 8–23)
CHLORIDE: 111 mmol/L (ref 98–111)
CO2: 20 mmol/L — ABNORMAL LOW (ref 22–32)
Calcium: 8.7 mg/dL — ABNORMAL LOW (ref 8.9–10.3)
Creatinine, Ser: 0.86 mg/dL (ref 0.44–1.00)
Glucose, Bld: 123 mg/dL — ABNORMAL HIGH (ref 70–99)
POTASSIUM: 5.3 mmol/L — AB (ref 3.5–5.1)
Sodium: 140 mmol/L (ref 135–145)
Total Bilirubin: 0.6 mg/dL (ref 0.3–1.2)
Total Protein: 7.1 g/dL (ref 6.5–8.1)

## 2018-07-16 LAB — PROCALCITONIN: PROCALCITONIN: 0.34 ng/mL

## 2018-07-16 LAB — CK: CK TOTAL: 280 U/L — AB (ref 38–234)

## 2018-07-16 LAB — LACTIC ACID, PLASMA
LACTIC ACID, VENOUS: 2.5 mmol/L — AB (ref 0.5–1.9)
Lactic Acid, Venous: 2.5 mmol/L (ref 0.5–1.9)

## 2018-07-16 MED ORDER — ENOXAPARIN SODIUM 40 MG/0.4ML ~~LOC~~ SOLN
40.0000 mg | Freq: Two times a day (BID) | SUBCUTANEOUS | Status: DC
  Administered 2018-07-16 – 2018-07-17 (×2): 40 mg via SUBCUTANEOUS
  Filled 2018-07-16 (×2): qty 0.4

## 2018-07-16 MED ORDER — SODIUM CHLORIDE 0.9 % IV BOLUS (SEPSIS): 500.0000 mL | Freq: Once | INTRAVENOUS | Status: DC

## 2018-07-16 MED ORDER — ACETAMINOPHEN 500 MG PO TABS
ORAL_TABLET | ORAL | Status: AC
  Administered 2018-07-16: 1000 mg via ORAL
  Filled 2018-07-16: qty 2

## 2018-07-16 MED ORDER — INSULIN GLARGINE 100 UNIT/ML ~~LOC~~ SOLN
50.0000 [IU] | Freq: Every day | SUBCUTANEOUS | Status: DC
  Administered 2018-07-16: 50 [IU] via SUBCUTANEOUS
  Filled 2018-07-16 (×2): qty 0.5

## 2018-07-16 MED ORDER — ONDANSETRON HCL 4 MG/2ML IJ SOLN: 4.0000 mg | Freq: Four times a day (QID) | INTRAMUSCULAR | Status: DC | PRN

## 2018-07-16 MED ORDER — LOSARTAN POTASSIUM 25 MG PO TABS
25.0000 mg | ORAL_TABLET | Freq: Every day | ORAL | Status: DC
  Administered 2018-07-16 – 2018-07-17 (×2): 25 mg via ORAL
  Filled 2018-07-16 (×2): qty 1

## 2018-07-16 MED ORDER — SERTRALINE HCL 50 MG PO TABS
100.0000 mg | ORAL_TABLET | Freq: Every day | ORAL | Status: DC
  Administered 2018-07-17: 100 mg via ORAL
  Filled 2018-07-16: qty 2

## 2018-07-16 MED ORDER — INSULIN GLARGINE 100 UNIT/ML ~~LOC~~ SOLN
60.0000 [IU] | Freq: Every day | SUBCUTANEOUS | Status: DC
  Administered 2018-07-17: 60 [IU] via SUBCUTANEOUS
  Filled 2018-07-16: qty 0.6

## 2018-07-16 MED ORDER — SODIUM CHLORIDE 0.9 % IV BOLUS (SEPSIS)
1000.0000 mL | Freq: Once | INTRAVENOUS | Status: AC
  Administered 2018-07-16: 1000 mL via INTRAVENOUS

## 2018-07-16 MED ORDER — ONDANSETRON HCL 4 MG PO TABS: 4.0000 mg | ORAL_TABLET | Freq: Four times a day (QID) | ORAL | Status: DC | PRN

## 2018-07-16 MED ORDER — SODIUM CHLORIDE 0.9 % IV SOLN
2.0000 g | INTRAVENOUS | Status: DC
  Administered 2018-07-16 – 2018-07-17 (×2): 2 g via INTRAVENOUS
  Filled 2018-07-16 (×2): qty 2

## 2018-07-16 MED ORDER — SODIUM CHLORIDE 0.9 % IV SOLN
100.0000 mg | Freq: Two times a day (BID) | INTRAVENOUS | Status: DC
  Administered 2018-07-16 – 2018-07-17 (×3): 100 mg via INTRAVENOUS
  Filled 2018-07-16 (×4): qty 100

## 2018-07-16 MED ORDER — INSULIN ASPART 100 UNIT/ML ~~LOC~~ SOLN: 0.0000 [IU] | Freq: Every day | SUBCUTANEOUS | Status: DC

## 2018-07-16 MED ORDER — SODIUM CHLORIDE 0.9 % IV BOLUS (SEPSIS): 1000.0000 mL | Freq: Once | INTRAVENOUS | Status: DC

## 2018-07-16 MED ORDER — SODIUM POLYSTYRENE SULFONATE 15 GM/60ML PO SUSP: 30.0000 g | Freq: Once | ORAL | Status: DC

## 2018-07-16 MED ORDER — INSULIN ASPART 100 UNIT/ML ~~LOC~~ SOLN
0.0000 [IU] | Freq: Three times a day (TID) | SUBCUTANEOUS | Status: DC
  Administered 2018-07-17: 2 [IU] via SUBCUTANEOUS

## 2018-07-16 MED ORDER — SODIUM CHLORIDE 0.9 % IV SOLN: 500.0000 mg | Freq: Once | INTRAVENOUS | Status: DC

## 2018-07-16 MED ORDER — ACETAMINOPHEN 325 MG PO TABS
650.0000 mg | ORAL_TABLET | Freq: Four times a day (QID) | ORAL | Status: DC | PRN
  Administered 2018-07-16 – 2018-07-17 (×2): 650 mg via ORAL
  Filled 2018-07-16 (×2): qty 2

## 2018-07-16 MED ORDER — SENNOSIDES-DOCUSATE SODIUM 8.6-50 MG PO TABS: 1.0000 | ORAL_TABLET | Freq: Every evening | ORAL | Status: DC | PRN

## 2018-07-16 MED ORDER — ACETAMINOPHEN 650 MG RE SUPP: 650.0000 mg | Freq: Four times a day (QID) | RECTAL | Status: DC | PRN

## 2018-07-16 MED ORDER — DOCUSATE SODIUM 100 MG PO CAPS
100.0000 mg | ORAL_CAPSULE | Freq: Every day | ORAL | Status: DC
  Administered 2018-07-16 – 2018-07-17 (×2): 100 mg via ORAL
  Filled 2018-07-16 (×2): qty 1

## 2018-07-16 MED ORDER — FAMOTIDINE 20 MG PO TABS
20.0000 mg | ORAL_TABLET | Freq: Two times a day (BID) | ORAL | Status: DC
  Administered 2018-07-16 – 2018-07-17 (×2): 20 mg via ORAL
  Filled 2018-07-16 (×2): qty 1

## 2018-07-16 MED ORDER — BISACODYL 5 MG PO TBEC: 5.0000 mg | DELAYED_RELEASE_TABLET | Freq: Every day | ORAL | Status: DC | PRN

## 2018-07-16 MED ORDER — ASPIRIN EC 81 MG PO TBEC
81.0000 mg | DELAYED_RELEASE_TABLET | Freq: Every day | ORAL | Status: DC
  Administered 2018-07-16 – 2018-07-17 (×2): 81 mg via ORAL
  Filled 2018-07-16 (×2): qty 1

## 2018-07-16 MED ORDER — FERROUS SULFATE 325 (65 FE) MG PO TABS
325.0000 mg | ORAL_TABLET | Freq: Every day | ORAL | Status: DC
  Administered 2018-07-17: 325 mg via ORAL
  Filled 2018-07-16: qty 1

## 2018-07-16 MED ORDER — ACETAMINOPHEN 500 MG PO TABS
1000.0000 mg | ORAL_TABLET | Freq: Once | ORAL | Status: AC
  Administered 2018-07-16: 1000 mg via ORAL

## 2018-07-16 MED ORDER — NYSTATIN 100000 UNIT/GM EX POWD
Freq: Two times a day (BID) | CUTANEOUS | Status: DC
  Administered 2018-07-16 – 2018-07-17 (×2): via TOPICAL
  Filled 2018-07-16: qty 15

## 2018-07-16 MED ORDER — ROSUVASTATIN CALCIUM 10 MG PO TABS
5.0000 mg | ORAL_TABLET | Freq: Every evening | ORAL | Status: DC
  Administered 2018-07-16: 5 mg via ORAL
  Filled 2018-07-16: qty 1

## 2018-07-16 MED ORDER — INSULIN ASPART 100 UNIT/ML ~~LOC~~ SOLN
0.0000 [IU] | Freq: Three times a day (TID) | SUBCUTANEOUS | Status: DC
  Filled 2018-07-16: qty 1

## 2018-07-16 NOTE — Progress Notes (Signed)
Looked at home CPAP unit, no frayed wires observed.  [facility] called for later inspection of unit.

## 2018-07-16 NOTE — ED Provider Notes (Signed)
[facility] Emergency Department Provider Note   ____________________________________________   First MD Initiated Contact with Patient 07/16/18 [phone]     (approximate)  I have reviewed the triage vital signs and the nursing notes.   HISTORY  Chief Complaint Fever    HPI Maria Singh is a 67 y.o. female brought to the ED from home by her family with a chief complaint of fever and weakness.  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  Also has a history of diabetes, hyperlipidemia, hypertension, GERD.  Reports fever of 101 F around 10 PM and too weak to transfer from wheelchair to her bed which she normally is able to do.  Took acetaminophen at that time.  Her wife is a Radiation protection practitioner physician which state patient took a course of Keflex last week for left foot cellulitis.  The area appears better this week.  Spouse did note area to patient's right anterior shin which looks red; patient reports her cat scratched her.  Denies cough, chest pain, shortness of breath, abdominal pain, nausea, vomiting, dysuria, diarrhea.  Denies recent travel or trauma.   Past Medical History:  Diagnosis Date  . 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 Depression   . Diabetes mellitus without complication ([place])   . GERD (gastroesophageal reflux disease)   . Hyperlipidemia   . Hypertension   . Rotator cuff injury Nov. 2012   left arm  . Sleep apnea     Patient Active Problem List   Diagnosis Date Noted  . GERD (gastroesophageal reflux disease) 04/13/2018  . Major depression, chronic 08/10/2017  . Hyperlipidemia associated with type 2 diabetes mellitus ([place]) 08/10/2017  . Essential hypertension 08/10/2017  . 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 Pain in limb 10/30/2016  . Swelling of limb 10/30/2016  . Diabetes type 2, uncontrolled ([place]) 06/19/2015    Past Surgical History:  Procedure Laterality Date  . arm surgery Left    broken arm  . HERNIA  REPAIR      Prior to Admission medications   Medication Sig Start Date End Date Taking? Authorizing Provider  ACCU-CHEK GUIDE test strip CHECK BLOOD SUGAR TWO TIMES DAILY 01/05/18   Juline Patch, MD  ACCU-CHEK GUIDE test strip CHECK BLOOD SUGAR TWO TIMES DAILY 07/11/18   Juline Patch, MD  acetaminophen (TYLENOL) 500 MG tablet Take 500 mg by mouth every 6 (six) hours as needed.    [provider]  aspirin 81 MG tablet Take 81 mg by mouth daily.    [provider]  BAYER MICROLET LANCETS lancets USE AS DIRECTED THREE TIMES A DAY 08/10/16   Juline Patch, MD  clotrimazole-betamethasone (LOTRISONE) cream Apply 1 application topically 2 (two) times daily. 04/26/18   Juline Patch, MD  diclofenac sodium (VOLTAREN) 1 % GEL Apply to area BID PRN Patient not taking: Reported on 04/13/2018 12/27/15   Juline Patch, MD  docusate sodium (COLACE) 100 MG capsule Take 100 mg by mouth daily.     [provider]  EPINEPHrine 0.3 mg/0.3 mL IJ SOAJ injection Inject 0.3 mLs (0.3 mg total) into the muscle once. 06/05/15   Juline Patch, MD  ferrous sulfate 325 (65 FE) MG tablet Take 1 tablet (325 mg total) by mouth daily with breakfast. 04/26/18   Juline Patch, MD  fluconazole (DIFLUCAN) 150 MG tablet TAKE 1 TABLET BY MOUTH ONCE FOR 1 DOSE. TAKE 1 TODAY AND REPEAT IN 1 WEEK 07/01/18  Juline Patch, MD  HUMALOG KWIKPEN 100 UNIT/ML KiwkPen Inject 0.22 mLs (22 Units total) into the skin 3 (three) times daily. 08/10/17   Juline Patch, MD  ibuprofen (ADVIL,MOTRIN) 800 MG tablet TAKE ONE TABLET BY MOUTH THREE TIMES A DAY 01/01/17   Juline Patch, MD  Insulin Glargine (LANTUS SOLOSTAR) 100 UNIT/ML Solostar Pen Inject 110 Units into the skin daily at 10 pm. 60 units in am and 50 units in pm Patient taking differently: Inject 110 Units into the skin daily at 10 pm. 60 units in pm and 50 units in am 08/10/17   Juline Patch, MD  Insulin Pen Needle (NOVOFINE) 32G X 6 MM MISC USE AS  DIRECTED 3 TIMES A DAY 08/10/17   Otilio Miu C, MD  Lidocaine 0.5 % GEL Apply 1 application topically 2 (two) times daily. 05/25/18   Juline Patch, MD  liraglutide (VICTOZA) 18 MG/3ML SOPN INJECT 1.8 MLS (10.8 MG TOTAL) INTO THE SKIN ONCE DAILY 04/26/18   Juline Patch, MD  loratadine (CLARITIN) 10 MG tablet Take 10 mg by mouth daily.    [provider]  losartan (COZAAR) 25 MG tablet Take 1 tablet (25 mg total) by mouth daily. Reported on 02/26/2016 08/10/17   Juline Patch, MD  metFORMIN (GLUCOPHAGE) 1000 MG tablet Take 1 tablet (1,000 mg total) by mouth 2 (two) times daily. 08/10/17   Juline Patch, MD  metFORMIN (GLUCOPHAGE) 500 MG tablet Take 1 tablet (500 mg total) by mouth every morning. 08/10/17   Juline Patch, MD  mupirocin ointment (BACTROBAN) 2 % APPLY TO AFFECTED AREA(S) AS NEEDED 06/30/18   Glean Hess, MD  NON FORMULARY Auto BiPap nightly    [provider]  nystatin cream (MYCOSTATIN) APPLY 1 APPLICATION TOPICALLY 2 (TWO) TIMES DAILY. 07/01/18   Juline Patch, MD  ondansetron (ZOFRAN) 4 MG tablet TAKE 1 TABLET BY MOUTH EVERY 8 HOURS AS NEEDED FOR NAUSEA OR VOMITING. 04/04/18   Juline Patch, MD  ranitidine (ZANTAC) 150 MG tablet Take 1 tablet (150 mg total) by mouth 2 (two) times daily. 05/03/18   Glean Hess, MD  rosuvastatin (CRESTOR) 5 MG tablet Take 1 tablet (5 mg total) by mouth daily. 08/10/17   Juline Patch, MD  sertraline (ZOLOFT) 100 MG tablet Take 1 tablet (100 mg total) by mouth daily. 08/10/17   Juline Patch, MD  triamcinolone cream (KENALOG) 0.5 % Apply 1 application topically 3 (three) times daily. 07/09/15   Juline Patch, MD    Allergies Sulfa antibiotics  Family History  Problem Relation Age of Onset  . Cancer Mother   . Heart disease Father   . Diabetes Maternal Grandmother   . Heart disease Paternal Grandfather   . Breast cancer Maternal Aunt        mat great aunt    Social History Social History    Tobacco Use  . Smoking status: Never Smoker  . Smokeless tobacco: Never Used  Substance Use Topics  . Alcohol use: Yes    Alcohol/week: 0.0 standard drinks    Comment: very very rare  . Drug use: No    Review of Systems  Constitutional: Positive for fever/chills Eyes: No visual changes. ENT: No sore throat. Cardiovascular: Denies chest pain. Respiratory: Denies shortness of breath. Gastrointestinal: No abdominal pain.  No nausea, no vomiting.  No diarrhea.  No constipation. Genitourinary: Negative for dysuria. Musculoskeletal: Negative for back pain. Skin: Negative for rash. Neurological:  Negative for headaches, focal weakness or numbness.   ____________________________________________   PHYSICAL EXAM:  VITAL SIGNS: ED Triage Vitals  Enc Vitals Group     BP 07/16/18 0338 (!) 150/60     Pulse Rate 07/16/18 0338 (!) 106     Resp 07/16/18 0338 18     Temp 07/16/18 0338 99.6 F (37.6 C)     Temp Source 07/16/18 0338 Oral     SpO2 07/16/18 0338 96 %     Weight 07/16/18 0335 (!) 305 lb (138.3 kg)     Height 07/16/18 0335 5\' 5"  (1.651 m)     Head Circumference --      Peak Flow --      Pain Score 07/16/18 0334 4     Pain Loc --      Pain Edu? --      Excl. in [place]? --     Constitutional: Alert and oriented.  Chronically ill appearing and in mild acute distress. Eyes: Conjunctivae are normal. PERRL. EOMI. Head: Atraumatic. Nose: No congestion/rhinnorhea. Mouth/Throat: Mucous membranes are moist.  Oropharynx non-erythematous. Neck: No stridor.  Supple neck without meningismus. Cardiovascular: Normal rate, regular rhythm. Grossly normal heart sounds.  Good peripheral circulation. Respiratory: Normal respiratory effort.  No retractions. Lungs CTAB. Gastrointestinal: Obese.  Soft and nontender to light or deep palpation. No distention. No abdominal bruits. No CVA tenderness. Musculoskeletal: Anterior right shin with small area of warmth and erythema; no fluctuance.  No  joint effusions. Neurologic:  Normal speech and language. No gross focal neurologic deficits are appreciated.  Skin:  Skin is hot, dry and intact. No rash noted. Psychiatric: Mood and affect are normal. Speech and behavior are normal.  ____________________________________________   LABS (all labs ordered are listed, but only abnormal results are displayed)  Labs Reviewed  COMPREHENSIVE METABOLIC PANEL - Abnormal; Notable for the following components:      Result Value   Potassium 5.3 (*)    CO2 20 (*)    Glucose, Bld 123 (*)    Calcium 8.7 (*)    Alkaline Phosphatase 27 (*)    All other components within normal limits  LACTIC ACID, PLASMA - Abnormal; Notable for the following components:   Lactic Acid, Venous 2.5 (*)    All other components within normal limits  CBC WITH DIFFERENTIAL/PLATELET - Abnormal; Notable for the following components:   Hemoglobin 9.3 (*)    HCT 28.2 (*)    MCV 73.9 (*)    MCH 24.3 (*)    RDW 22.5 (*)    Lymphs Abs 0.3 (*)    All other components within normal limits  URINALYSIS, COMPLETE (UACMP) WITH MICROSCOPIC - Abnormal; Notable for the following components:   Color, Urine STRAW (*)    APPearance CLEAR (*)    All other components within normal limits  CK - Abnormal; Notable for the following components:   Total CK 280 (*)    All other components within normal limits  CULTURE, BLOOD (ROUTINE X 2)  CULTURE, BLOOD (ROUTINE X 2)  URINE CULTURE  PROTIME-INR  LACTIC ACID, PLASMA   ____________________________________________  EKG  ED ECG REPORT I, Haydin Calandra J, the attending physician, personally viewed and interpreted this ECG.   Date: 07/16/2018  EKG Time: 0627  Rate: 84  Rhythm: normal EKG, normal sinus rhythm  Axis: Normal  Intervals:none  ST&T Change: Nonspecific  ____________________________________________  RADIOLOGY  ED MD interpretation: Vascular congestion  Official radiology report(s): Dg Chest [facility] 1 View  Result  Date: 07/16/2018 CLINICAL DATA:  67 year old female with fever. EXAM: PORTABLE CHEST 1 VIEW COMPARISON:  Chest radiograph 11/16/2016 FINDINGS: There is mild cardiomegaly with mild vascular congestion. No focal consolidation, pleural effusion, or pneumothorax. Degenerative changes of the spine and shoulders. No acute osseous pathology. IMPRESSION: Mild cardiomegaly and vascular congestion.  No focal consolidation. Electronically Signed   By: Anner Crete M.D.   On: 07/16/2018 06:23    ____________________________________________   PROCEDURES  Procedure(s) performed: None  Procedures  Critical Care performed: Yes, see critical care note(s)   CRITICAL CARE Performed by: Paulette Blanch   Total critical care time: 30 minutes  Critical care time was exclusive of separately billable procedures and treating other patients.  Critical care was necessary to treat or prevent imminent or life-threatening deterioration.  Critical care was time spent personally by me on the following activities: development of treatment plan with patient and/or surrogate as well as nursing, discussions with consultants, evaluation of patient's response to treatment, examination of patient, obtaining history from patient or surrogate, ordering and performing treatments and interventions, ordering and review of laboratory studies, ordering and review of radiographic studies, pulse oximetry and re-evaluation of patient's condition.  ____________________________________________   INITIAL IMPRESSION / ASSESSMENT AND PLAN / ED COURSE  As part of my medical decision making, I reviewed the following data within the [place] History obtained from family, Nursing notes reviewed and incorporated, Labs reviewed, EKG interpreted, Old chart reviewed, Radiograph reviewed and Notes from prior ED visits   67 year old female with multiple medical issues who presents with fever and weakness.  Differential  diagnosis includes but is not limited to sepsis, pneumonia, UTI, metabolic, electrolyte abnormalities, etc.  History of sepsis.  Her physician spouse states these are symptoms when patient develops sepsis.  Sometimes with sepsis she also developed rhabdomyolysis.  Also asking to check hemoccult as patient has iron deficiency anemia.  ED code sepsis initiated.  Will initiate IV fluid resuscitation.  Obtain rectal temperature.  Clinical Course as of Jul 17 639  Sat Jul 16, 2018  9983 Rectal temp 100.3 F.  Tylenol will be administered.  IV fluids infusing.  Awaiting urine specimen.  Anticipate hospitalization.   [JS]  [id number] Vascular congestion noted on chest x-ray.  Will hold 2.5 L IV fluid for this reason.  Patient will get 2 L IV fluids total.  Will discuss with hospitalist to evaluate patient in the emergency department for admission.   [JS]    Clinical Course User Index [JS] Paulette Blanch, MD     ____________________________________________   FINAL CLINICAL IMPRESSION(S) / ED DIAGNOSES  Final diagnoses:  Fever, unspecified fever cause  Sepsis, due to unspecified organism  Cellulitis of right lower extremity     ED Discharge Orders    None       Note:  This document was prepared using Dragon voice recognition software and may include unintentional dictation errors.    Paulette Blanch, MD 07/16/18 [id number]

## 2018-07-16 NOTE — H&P (Signed)
[facility] [place] NAME: Maria Singh    MR#:  [id number]  DATE OF BIRTH:  07-04-1951  DATE OF ADMISSION:  07/16/2018  PRIMARY CARE PHYSICIAN: Glean Hess, MD   REQUESTING/REFERRING PHYSICIAN: Paulette Blanch, MD  CHIEF COMPLAINT:   Chief Complaint  Patient presents with  . Fever    HISTORY OF PRESENT ILLNESS:  Maria Singh  is a 67 y.o. female with a known history of Charcot-Marie-Tooth muscular dystrophy, T2IDDM p/w fever, RLE cellulitis. Pt states that her cat scratched her right anterior shin during the day (Friday 09/27). @~2230PM, she develop fever (T 101.4) and chills. She is wheelchair-bound at baseline, but is typically able to use her upper body to perform transfers from and to the wheelchair. Owing to her acute illness, however, she had muscle weakness severe enough to prevent her from making effective transfers. Due to illness impeding normal function, pt's significant other drove pt to ED. Apart from fever, chills and generalized weakness, she states she feels reasonably well. She does not have any other active complaints. She denies cough, SOB, N/V/D/AP, urinary symptoms. She is reasonably well-appearing and in no acute distress at the time of my assessment. She does not carry a toxic appearance.  PAST MEDICAL HISTORY:   Past Medical History:  Diagnosis Date  . 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 Depression   . Diabetes mellitus without complication ([place])   . GERD (gastroesophageal reflux disease)   . Hyperlipidemia   . Hypertension   . Rotator cuff injury Nov. 2012   left arm  . Sleep apnea     PAST SURGICAL HISTORY:   Past Surgical History:  Procedure Laterality Date  . arm surgery Left    broken arm  . HERNIA REPAIR      SOCIAL HISTORY:   Social History   Tobacco Use  . Smoking status: Never Smoker  . Smokeless tobacco: Never Used  Substance Use Topics  . Alcohol use: Yes    Alcohol/week: 0.0  standard drinks    Comment: very very rare    FAMILY HISTORY:   Family History  Problem Relation Age of Onset  . Cancer Mother   . Heart disease Father   . Diabetes Maternal Grandmother   . Heart disease Paternal Grandfather   . Breast cancer Maternal Aunt        mat great aunt    DRUG ALLERGIES:   Allergies  Allergen Reactions  . Sulfa Antibiotics Rash    REVIEW OF SYSTEMS:   Review of Systems  Constitutional: Positive for chills, fever and malaise/fatigue. Negative for diaphoresis and weight loss.  HENT: Negative for congestion, ear pain, hearing loss, nosebleeds, sinus pain, sore throat and tinnitus.   Eyes: Negative for blurred vision, double vision and photophobia.  Respiratory: Negative for cough, hemoptysis, sputum production, shortness of breath and wheezing.   Cardiovascular: Positive for leg swelling (+) RLE erythema/edema. Negative for chest pain, palpitations, orthopnea, claudication and PND.  Gastrointestinal: Negative for abdominal pain, blood in stool, constipation, diarrhea, heartburn, melena, nausea and vomiting.  Genitourinary: Negative for dysuria, frequency, hematuria and urgency.  Musculoskeletal: Negative for back pain, joint pain, myalgias and neck pain.  Skin: Negative for itching and rash.  Neurological: Positive for weakness. Negative for dizziness, tingling, tremors, sensory change, speech change, focal weakness, seizures, loss of consciousness and headaches.  Psychiatric/Behavioral: Negative for memory loss. The patient does not have insomnia.    MEDICATIONS AT  HOME:   Prior to Admission medications   Medication Sig Start Date End Date Taking? Authorizing Provider  ACCU-CHEK GUIDE test strip CHECK BLOOD SUGAR TWO TIMES DAILY 01/05/18   Juline Patch, MD  ACCU-CHEK GUIDE test strip CHECK BLOOD SUGAR TWO TIMES DAILY 07/11/18   Juline Patch, MD  acetaminophen (TYLENOL) 500 MG tablet Take 500 mg by mouth every 6 (six) hours as needed.     [provider]  aspirin 81 MG tablet Take 81 mg by mouth daily.    [provider]  BAYER MICROLET LANCETS lancets USE AS DIRECTED THREE TIMES A DAY 08/10/16   Juline Patch, MD  clotrimazole-betamethasone (LOTRISONE) cream Apply 1 application topically 2 (two) times daily. 04/26/18   Juline Patch, MD  diclofenac sodium (VOLTAREN) 1 % GEL Apply to area BID PRN Patient not taking: Reported on 04/13/2018 12/27/15   Juline Patch, MD  docusate sodium (COLACE) 100 MG capsule Take 100 mg by mouth daily.     [provider]  EPINEPHrine 0.3 mg/0.3 mL IJ SOAJ injection Inject 0.3 mLs (0.3 mg total) into the muscle once. 06/05/15   Juline Patch, MD  ferrous sulfate 325 (65 FE) MG tablet Take 1 tablet (325 mg total) by mouth daily with breakfast. 04/26/18   Juline Patch, MD  fluconazole (DIFLUCAN) 150 MG tablet TAKE 1 TABLET BY MOUTH ONCE FOR 1 DOSE. TAKE 1 TODAY AND REPEAT IN 1 WEEK 07/01/18   Juline Patch, MD  HUMALOG KWIKPEN 100 UNIT/ML KiwkPen Inject 0.22 mLs (22 Units total) into the skin 3 (three) times daily. 08/10/17   Juline Patch, MD  ibuprofen (ADVIL,MOTRIN) 800 MG tablet TAKE ONE TABLET BY MOUTH THREE TIMES A DAY 01/01/17   Juline Patch, MD  Insulin Glargine (LANTUS SOLOSTAR) 100 UNIT/ML Solostar Pen Inject 110 Units into the skin daily at 10 pm. 60 units in am and 50 units in pm Patient taking differently: Inject 110 Units into the skin daily at 10 pm. 60 units in pm and 50 units in am 08/10/17   Juline Patch, MD  Insulin Pen Needle (NOVOFINE) 32G X 6 MM MISC USE AS DIRECTED 3 TIMES A DAY 08/10/17   Otilio Miu C, MD  Lidocaine 0.5 % GEL Apply 1 application topically 2 (two) times daily. 05/25/18   Juline Patch, MD  liraglutide (VICTOZA) 18 MG/3ML SOPN INJECT 1.8 MLS (10.8 MG TOTAL) INTO THE SKIN ONCE DAILY 04/26/18   Juline Patch, MD  loratadine (CLARITIN) 10 MG tablet Take 10 mg by mouth daily.    [provider]  losartan (COZAAR) 25  MG tablet Take 1 tablet (25 mg total) by mouth daily. Reported on 02/26/2016 08/10/17   Juline Patch, MD  metFORMIN (GLUCOPHAGE) 1000 MG tablet Take 1 tablet (1,000 mg total) by mouth 2 (two) times daily. 08/10/17   Juline Patch, MD  metFORMIN (GLUCOPHAGE) 500 MG tablet Take 1 tablet (500 mg total) by mouth every morning. 08/10/17   Juline Patch, MD  mupirocin ointment (BACTROBAN) 2 % APPLY TO AFFECTED AREA(S) AS NEEDED 06/30/18   Glean Hess, MD  NON FORMULARY Auto BiPap nightly    [provider]  nystatin cream (MYCOSTATIN) APPLY 1 APPLICATION TOPICALLY 2 (TWO) TIMES DAILY. 07/01/18   Juline Patch, MD  ondansetron (ZOFRAN) 4 MG tablet TAKE 1 TABLET BY MOUTH EVERY 8 HOURS AS NEEDED FOR NAUSEA OR VOMITING. 04/04/18   Juline Patch,  MD  ranitidine (ZANTAC) 150 MG tablet Take 1 tablet (150 mg total) by mouth 2 (two) times daily. 05/03/18   Glean Hess, MD  rosuvastatin (CRESTOR) 5 MG tablet Take 1 tablet (5 mg total) by mouth daily. 08/10/17   Juline Patch, MD  sertraline (ZOLOFT) 100 MG tablet Take 1 tablet (100 mg total) by mouth daily. 08/10/17   Juline Patch, MD  triamcinolone cream (KENALOG) 0.5 % Apply 1 application topically 3 (three) times daily. 07/09/15   Juline Patch, MD      VITAL SIGNS:  Blood pressure (!) 133/52, pulse 85, temperature 100.3 F (37.9 C), temperature source Rectal, resp. rate 18, height 5\' 5"  (1.651 m), weight (!) 138.3 kg, SpO2 98 %.  PHYSICAL EXAMINATION:  Physical Exam  Constitutional: She is oriented to person, place, and time. She appears well-developed and well-nourished. She is active and cooperative.  Non-toxic appearance. She does not have a sickly appearance. She does not appear ill. No distress. She is not intubated.  HENT:  Head: Normocephalic and atraumatic.  Mouth/Throat: Oropharynx is clear and moist. No oropharyngeal exudate.  Eyes: Conjunctivae, EOM and lids are normal. No scleral icterus.  Neck: Neck supple.  No JVD present. No thyromegaly present.  Cardiovascular: Normal rate, regular rhythm, S1 normal and S2 normal.  No extrasystoles are present. Exam reveals no gallop, no S3, no S4, no distant heart sounds and no friction rub.  Murmur heard.  Systolic murmur is present with a grade of 3/6. Pulmonary/Chest: Effort normal. No accessory muscle usage or stridor. No apnea, no tachypnea and no bradypnea. She is not intubated. No respiratory distress. She has decreased breath sounds in the right upper field, the right middle field, the right lower field, the left upper field, the left middle field and the left lower field. She has no wheezes. She has no rhonchi. She has no rales.  Abdominal: Soft. Bowel sounds are normal. She exhibits no distension. There is no tenderness. There is no rigidity, no rebound and no guarding.  Musculoskeletal: Normal range of motion. She exhibits edema (+) RLE erythema/edema/TTP and tenderness (+) RLE erythema/edema/TTP.  Lymphadenopathy:    She has no cervical adenopathy.  Neurological: She is alert and oriented to person, place, and time. She is not disoriented.  Hx CMT, chronic atrophy/weakness.  Skin: Skin is warm, dry and intact. She is not diaphoretic. There is erythema (+) RLE erythema/edema/TTP.  Psychiatric: She has a normal mood and affect. Her speech is normal and behavior is normal. Judgment and thought content normal. Cognition and memory are normal.   LABORATORY PANEL:   CBC Recent Labs  Lab 07/16/18 0351  WBC 6.7  HGB 9.3*  HCT 28.2*  PLT 201   ------------------------------------------------------------------------------------------------------------------  Chemistries  Recent Labs  Lab 07/16/18 0351  NA 140  K 5.3*  CL 111  CO2 20*  GLUCOSE 123*  BUN 23  CREATININE 0.86  CALCIUM 8.7*  AST 33  ALT 30  ALKPHOS 27*  BILITOT 0.6    ------------------------------------------------------------------------------------------------------------------  Cardiac Enzymes No results for input(s): TROPONINI in the last 168 hours. ------------------------------------------------------------------------------------------------------------------  RADIOLOGY:  Dg Chest Port 1 View  Result Date: 07/16/2018 CLINICAL DATA:  67 year old female with fever. EXAM: PORTABLE CHEST 1 VIEW COMPARISON:  Chest radiograph 11/16/2016 FINDINGS: There is mild cardiomegaly with mild vascular congestion. No focal consolidation, pleural effusion, or pneumothorax. Degenerative changes of the spine and shoulders. No acute osseous pathology. IMPRESSION: Mild cardiomegaly and vascular congestion.  No focal consolidation. Electronically  Signed   By: Anner Crete M.D.   On: 07/16/2018 06:23   IMPRESSION AND PLAN:   A/P: 52F w/ PMHx Charcot-Marie-Tooth muscular dystrophy, T2IDDM p/w RLE cat scratch, fever, RLE cellulitis, sepsis. Hyperkalemia, hyperglycemia, hypocalcemia, microcytic anemia. -RLE cat scratch, fever, RLE cellulitis, sepsis: RLE cat scratch. (+) erythema + edema + mild TTP. (-) abscess/fluctuance/discharge. Febrile at home but afebrile in ED. (-) leukocytosis. Recorded tachypnea + tachycardia in ED, SIRS (+). RLE is the only potential source of infxn I have identified thus far. U/A (-), CXR (-). Lactate 2.5 x2. BCx, PCT pending. IVF. Doxycycline + Ceftriaxone [gram (-) coverage in diabetic]. -Hyperglycemia, T2IDDM: SSI. c/w Lantus. Hold PO antihyperglycemics. -Hyperkalemia: K+ 5.3, mild. Received IVF in ED, expected to improve on f/up. -Hypocalcemia: Ionized calcium. -Microcytic anemia: Hgb 9.3, MCV 73.9. Suspect iron deficiency anemia + anemia of chronic disease. May benefit from iron infusion, though this is likely not prudent to perform whilst pt is septic. -c/w other home meds. -FEN/GI: Renal diabetic diet (2/2 hyperkalemia). -DVT PPx:  Lovenox. -Code status: Full code. -Disposition: Admission, > 2 midnights.   All the records are reviewed and case discussed with ED provider. Management plans discussed with the patient, family and they are in agreement.  CODE STATUS: Full code.  TOTAL TIME TAKING CARE OF THIS PATIENT: 75 minutes.    Arta Silence M.D on 07/16/2018 at 8:03 AM  Between 7am to 6pm - Pager - [phone]  After 6pm go to [email or web address] - Proofreader  [facility]  Office  [phone]  CC: Primary care physician; Glean Hess, MD   Note: This dictation was prepared with Dragon dictation along with smaller phrase technology. Any transcriptional errors that result from this process are unintentional.

## 2018-07-16 NOTE — ED Notes (Signed)
Family at bedside. 

## 2018-07-16 NOTE — Progress Notes (Signed)
Admitted for right leg cellulitis with fever, started on IV antibiotics.  Right leg redness improving.  Patient has been is Dr. Otilio Miu who is at the bedside and provided all relevant information. Lab data reviewed. Vitals reviewed, patient afebrile. Assessment and plan: #1 right leg cellulitis improving, continue Rocephin, doxycycline likely discharge home tomorrow with oral antibiotics likely doxycycline. 2.  Sepsis present on admission with elevated lactic acid, follow clinical course, check lactic acid tomorrow, hold metformin.  Discussed with patient, her husband. 3.  Diabetes mellitus type 2, patient is on high-dose Lantus, Victoza at home. 4.  History of muscular dystrophy, wheelchair-bound, patient has Charcot-Marie-Tooth disease.    #5 hyperkalemia: Mild, watch closely. Time spent about 20 minutes.

## 2018-07-16 NOTE — Progress Notes (Signed)
Took over for care of patient at 1230. Last lactic acid was 2.5 at 0557 this am, no redraw ordered.  Potassium was 5.3, no orders to address potassium level. Text pages MD. Waiting for response at this time.

## 2018-07-16 NOTE — ED Notes (Signed)
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

## 2018-07-16 NOTE — Progress Notes (Signed)
CODE SEPSIS - PHARMACY COMMUNICATION  **Broad Spectrum Antibiotics should be administered within 1 hour of Sepsis diagnosis**  Time Code Sepsis Called/Page Received: @ 0539  Antibiotics Ordered: Ceftriaxone                                       Azithromycin   Time of 1st antibiotic administration: @ [phone]  Additional action taken by pharmacy: [place], PharmD, BCPS Clinical Pharmacist 07/16/2018 7:31 AM

## 2018-07-16 NOTE — ED Notes (Signed)
CRITICAL LAB: LACTIC is 2.5, Lab, Dr. Beather Arbour notified, orders received

## 2018-07-16 NOTE — Progress Notes (Signed)
Verbal order from Dr. Vianne Bulls for Nystatin powder BID to abdominal folds and ferrous sulfate 325 mg orally daily

## 2018-07-16 NOTE — ED Notes (Signed)
ED Provider at bedside. 

## 2018-07-16 NOTE — Progress Notes (Signed)
Verbal order from Dr. Vianne Bulls to hold [facility]

## 2018-07-16 NOTE — ED Triage Notes (Signed)
Pt arrives POV to triage with c/o fever and weakness since 2300 last evening. Pt is wheelchair bound but can normally use her upper body to transfer to the bedside commode. Pt is feeling weak at this time in triage and is pale.

## 2018-07-17 DIAGNOSIS — W5503XA Scratched by cat, initial encounter: Secondary | ICD-10-CM | POA: Diagnosis not present

## 2018-07-17 DIAGNOSIS — G7100 Muscular dystrophy, unspecified: Secondary | ICD-10-CM | POA: Diagnosis not present

## 2018-07-17 DIAGNOSIS — A419 Sepsis, unspecified organism: Secondary | ICD-10-CM | POA: Diagnosis not present

## 2018-07-17 DIAGNOSIS — G600 Hereditary motor and sensory neuropathy: Secondary | ICD-10-CM | POA: Diagnosis not present

## 2018-07-17 DIAGNOSIS — L03115 Cellulitis of right lower limb: Secondary | ICD-10-CM | POA: Diagnosis not present

## 2018-07-17 DIAGNOSIS — E1165 Type 2 diabetes mellitus with hyperglycemia: Secondary | ICD-10-CM | POA: Diagnosis not present

## 2018-07-17 DIAGNOSIS — S80811A Abrasion, right lower leg, initial encounter: Secondary | ICD-10-CM | POA: Diagnosis not present

## 2018-07-17 DIAGNOSIS — D649 Anemia, unspecified: Secondary | ICD-10-CM | POA: Diagnosis not present

## 2018-07-17 DIAGNOSIS — E875 Hyperkalemia: Secondary | ICD-10-CM | POA: Diagnosis not present

## 2018-07-17 DIAGNOSIS — E872 Acidosis: Secondary | ICD-10-CM | POA: Diagnosis not present

## 2018-07-17 DIAGNOSIS — L0390 Cellulitis, unspecified: Secondary | ICD-10-CM | POA: Diagnosis not present

## 2018-07-17 LAB — CBC
HCT: 25.6 % — ABNORMAL LOW (ref 35.0–47.0)
HEMOGLOBIN: 8.4 g/dL — AB (ref 12.0–16.0)
MCH: 24.1 pg — ABNORMAL LOW (ref 26.0–34.0)
MCHC: 32.6 g/dL (ref 32.0–36.0)
MCV: 73.7 fL — ABNORMAL LOW (ref 80.0–100.0)
PLATELETS: 161 10*3/uL (ref 150–440)
RBC: 3.47 MIL/uL — AB (ref 3.80–5.20)
RDW: 22.8 % — ABNORMAL HIGH (ref 11.5–14.5)
WBC: 4.9 10*3/uL (ref 3.6–11.0)

## 2018-07-17 LAB — BASIC METABOLIC PANEL
ANION GAP: 6 (ref 5–15)
BUN: 19 mg/dL (ref 8–23)
CALCIUM: 8.5 mg/dL — AB (ref 8.9–10.3)
CO2: 21 mmol/L — AB (ref 22–32)
Chloride: 116 mmol/L — ABNORMAL HIGH (ref 98–111)
Creatinine, Ser: 0.87 mg/dL (ref 0.44–1.00)
GFR calc non Af Amer: >60 mL/min (ref >60)
Glucose, Bld: 93 mg/dL (ref 70–99)
POTASSIUM: 5 mmol/L (ref 3.5–5.1)
Sodium: 143 mmol/L (ref 135–145)

## 2018-07-17 LAB — PROCALCITONIN: Procalcitonin: 0.25 ng/mL

## 2018-07-17 LAB — URINE CULTURE: Culture: NO GROWTH

## 2018-07-17 LAB — LACTIC ACID, PLASMA: Lactic Acid, Venous: 0.6 mmol/L (ref 0.5–1.9)

## 2018-07-17 LAB — GLUCOSE, CAPILLARY
GLUCOSE-CAPILLARY: 88 mg/dL (ref 70–99)
GLUCOSE-CAPILLARY: 121 mg/dL — AB (ref 70–99)

## 2018-07-17 MED ORDER — DOXYCYCLINE HYCLATE 100 MG PO CAPS: 100.0000 mg | ORAL_CAPSULE | Freq: Two times a day (BID) | ORAL | 0 refills | Status: DC

## 2018-07-17 NOTE — Progress Notes (Signed)
Resumed care from [facility] at [place].

## 2018-07-17 NOTE — Progress Notes (Signed)
Right leg cellulitis improved, hyperkalemia improved lactic acidosis improved discharge home with doxycycline for 10 days, resume metformin from tomorrow.  Discussed with Dr. Otilio Miu

## 2018-07-17 NOTE — Discharge Summary (Signed)
Maria Singh, is a 67 y.o. female  DOB 1951-07-27  MRN [id number].  Admission date:  07/16/2018  Admitting Physician  Arta Silence, MD  Discharge Date:  07/17/2018   Primary MD  Glean Hess, MD  Recommendations for primary care physician for things to follow:   Follow-up with PCP in 1 week   Admission Diagnosis  Cellulitis of right lower extremity [L03.115] Sepsis, due to unspecified organism [A41.9] Fever, unspecified fever cause [R50.9]   Discharge Diagnosis  Cellulitis of right lower extremity [L03.115] Sepsis, due to unspecified organism [A41.9] Fever, unspecified fever cause [R50.9]    Active Problems:   Sepsis [facility])      Past Medical History:  Diagnosis Date  . 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 Depression   . Diabetes mellitus without complication ([place])   . GERD (gastroesophageal reflux disease)   . Hyperlipidemia   . Hypertension   . Rotator cuff injury Nov. 2012   left arm  . Sleep apnea     Past Surgical History:  Procedure Laterality Date  . arm surgery Left    broken arm  . HERNIA REPAIR         History of present illness and  Hospital Course:     Kindly see H&P for history of present illness and admission details, please review complete Labs, Consult reports and Test reports for all details in brief  HPI  from the history and physical done on the day of admission 67 year old female patient admitted because of right leg cellulitis.  Had a fever, her cat scratched on her right anterior shin during the day and she had developed fever after that, patient admitted for fever,  right leg cellulitis   Hospital Course  #1. sepsis present on admission secondary to right leg cellulitis: Received IV fluids, IV antibiotics, patient right leg redness improved, lactic acid  level also decreased From 2.5-0.6.  Patient never had a white count elevation.  Hemodynamically stable stable.  No further fevers and has been afebrile.  Discharge home, patient received IV cefepime, doxycycline in the hospital, discharged home with doxycycline 100 mg p.o. twice daily for 10 days. #2 has history of Charcot admitted to the disease, muscular dystrophy 3.  Type 2 diabetes mellitus, patient is on high-dose insulin, Actos, metformin, metformin was not given during the hospital stay because of lactic acidosis.  Advised the patient to continue metformin from tomorrow, continue Lantus like the way she is doing at home. 4.  GERD: Continue PPIs 5.  Hyperlipidemia, ; continue statins.  Discharge Condition:    Follow UP  Follow-up Information    Glean Hess, MD In 1 week.   Specialty:  Internal Medicine Why:  patient to call and make appointment Contact information: [place] [address] [phone]             Discharge Instructions  and  Discharge Medications      Allergies as of 07/17/2018      Reactions   Sulfa Antibiotics Rash      Medication List    TAKE these medications   ACCU-CHEK GUIDE test strip Generic drug:  glucose blood CHECK BLOOD SUGAR TWO TIMES DAILY   ACCU-CHEK GUIDE test strip Generic drug:  glucose blood CHECK BLOOD SUGAR TWO TIMES DAILY   acetaminophen 500 MG tablet Commonly known as:  TYLENOL Take 500 mg by mouth every 6 (six) hours as needed.   aspirin 81 MG tablet Take  81 mg by mouth daily.   BAYER MICROLET LANCETS lancets USE AS DIRECTED THREE TIMES A DAY   clotrimazole-betamethasone cream Commonly known as:  LOTRISONE Apply 1 application topically 2 (two) times daily.   diclofenac sodium 1 % Gel Commonly known as:  VOLTAREN Apply to area BID PRN   docusate sodium 100 MG capsule Commonly known as:  COLACE Take 100 mg by mouth daily.   doxycycline 100 MG capsule Commonly known as:   VIBRAMYCIN Take 1 capsule (100 mg total) by mouth 2 (two) times daily.   EPINEPHrine 0.3 mg/0.3 mL Soaj injection Commonly known as:  EPI-PEN Inject 0.3 mLs (0.3 mg total) into the muscle once.   ferrous sulfate 325 (65 FE) MG tablet Take 1 tablet (325 mg total) by mouth daily with breakfast.   fluconazole 150 MG tablet Commonly known as:  DIFLUCAN TAKE 1 TABLET BY MOUTH ONCE FOR 1 DOSE. TAKE 1 TODAY AND REPEAT IN 1 WEEK   HUMALOG KWIKPEN 100 UNIT/ML KiwkPen Generic drug:  insulin lispro Inject 0.22 mLs (22 Units total) into the skin 3 (three) times daily.   ibuprofen 800 MG tablet Commonly known as:  ADVIL,MOTRIN TAKE ONE TABLET BY MOUTH THREE TIMES A DAY   Insulin Glargine 100 UNIT/ML Solostar Pen Commonly known as:  LANTUS Inject 110 Units into the skin daily at 10 pm. 60 units in am and 50 units in pm What changed:  additional instructions   Insulin Pen Needle 32G X 6 MM Misc USE AS DIRECTED 3 TIMES A DAY   Lidocaine 0.5 % Gel Apply 1 application topically 2 (two) times daily.   liraglutide 18 MG/3ML Sopn Commonly known as:  VICTOZA INJECT 1.8 MLS (10.8 MG TOTAL) INTO THE SKIN ONCE DAILY   loratadine 10 MG tablet Commonly known as:  CLARITIN Take 10 mg by mouth daily.   losartan 25 MG tablet Commonly known as:  COZAAR Take 1 tablet (25 mg total) by mouth daily. Reported on 02/26/2016   metFORMIN 500 MG tablet Commonly known as:  GLUCOPHAGE Take 1 tablet (500 mg total) by mouth every morning.   metFORMIN 1000 MG tablet Commonly known as:  GLUCOPHAGE Take 1 tablet (1,000 mg total) by mouth 2 (two) times daily.   mupirocin ointment 2 % Commonly known as:  BACTROBAN APPLY TO AFFECTED AREA(S) AS NEEDED   NON FORMULARY Auto BiPap nightly   nystatin cream Commonly known as:  MYCOSTATIN APPLY 1 APPLICATION TOPICALLY 2 (TWO) TIMES DAILY.   ondansetron 4 MG tablet Commonly known as:  ZOFRAN TAKE 1 TABLET BY MOUTH EVERY 8 HOURS AS NEEDED FOR NAUSEA OR  VOMITING.   pantoprazole 40 MG tablet Commonly known as:  PROTONIX Take 40 mg by mouth daily.   ranitidine 150 MG tablet Commonly known as:  ZANTAC Take 1 tablet (150 mg total) by mouth 2 (two) times daily.   rosuvastatin 5 MG tablet Commonly known as:  CRESTOR Take 1 tablet (5 mg total) by mouth daily.   sertraline 100 MG tablet Commonly known as:  ZOLOFT Take 1 tablet (100 mg total) by mouth daily.   triamcinolone cream 0.5 % Commonly known as:  KENALOG Apply 1 application topically 3 (three) times daily.         Diet and Activity recommendation: See Discharge Instructions above   Consults obtained -  Major procedures and Radiology Reports - PLEASE review detailed and final reports for all details, in brief -      Dg Chest [facility] 1 [address]  Result Date: 07/16/2018 CLINICAL DATA:  67 year old female with fever. EXAM: PORTABLE CHEST 1 VIEW COMPARISON:  Chest radiograph 11/16/2016 FINDINGS: There is mild cardiomegaly with mild vascular congestion. No focal consolidation, pleural effusion, or pneumothorax. Degenerative changes of the spine and shoulders. No acute osseous pathology. IMPRESSION: Mild cardiomegaly and vascular congestion.  No focal consolidation. Electronically Signed   By: Anner Crete M.D.   On: 07/16/2018 06:23    Micro Results    Recent Results (from the past 240 hour(s))  Culture, blood (Routine x 2)     Status: None (Preliminary result)   Collection Time: 07/16/18  3:51 AM  Result Value Ref Range Status   Specimen Description BLOOD BLOOD LEFT HAND  Final   Special Requests   Final    BOTTLES DRAWN AEROBIC AND ANAEROBIC Blood Culture adequate volume   Culture   Final    NO GROWTH 1 DAY Performed at [facility], [address] [place], [place] [address]    Report Status PENDING  Incomplete  Urine culture     Status: None   Collection Time: 07/16/18  5:57 AM  Result Value Ref Range Status   Specimen Description   Final    URINE,  CATHETERIZED Performed at [facility], [address] [place], [place] [address]    Special Requests   Final    NONE Performed at [facility], [address], [place], [place] [address]    Culture   Final    NO GROWTH Performed at [facility], [place] [address], [place], [place] [address]    Report Status 07/17/2018 FINAL  Final  Culture, blood (Routine x 2)     Status: None (Preliminary result)   Collection Time: 07/16/18  5:58 AM  Result Value Ref Range Status   Specimen Description BLOOD LEFT FATTY CASTS  Final   Special Requests   Final    BOTTLES DRAWN AEROBIC AND ANAEROBIC Blood Culture results may not be optimal due to an excessive volume of blood received in culture bottles   Culture   Final    NO GROWTH 1 DAY Performed at [facility], [address], [place], [place] [address]    Report Status PENDING  Incomplete       Today   Subjective:   Maria Singh today has no headache,no chest abdominal pain,no new weakness tingling or numbness, feels much better wants to go home today.   Objective:   Blood pressure (!) 127/58, pulse 75, temperature 98 F (36.7 C), temperature source Oral, resp. rate 19, height 5\' 5"  (1.651 m), weight (!) 138.3 kg, SpO2 96 %.   Intake/Output Summary (Last 24 hours) at 07/17/2018 1527 Last data filed at 07/17/2018 1007 Gross per 24 hour  Intake 579.63 ml  Output 1700 ml  Net -1120.37 ml    Exam Awake Alert, Oriented x 3, No new F.N deficits, Normal affect [place].AT,PERRAL Supple Neck,No JVD, No cervical lymphadenopathy appriciated.  Symmetrical Chest wall movement, Good air movement bilaterally, CTAB RRR,No Gallops,Rubs or new Murmurs, No Parasternal Heave +ve B.Sounds, Abd Soft, Non tender, No organomegaly appriciated, No rebound -guarding or rigidity. No Cyanosis, Clubbing or edema, No new Rash or bruise  Data Review   CBC w Diff:  Lab Results  Component Value Date   WBC 4.9 07/17/2018    HGB 8.4 (L) 07/17/2018   HGB 7.6 (L) 04/15/2018   HCT 25.6 (L) 07/17/2018   HCT 26.5 (L) 04/15/2018   PLT 161  07/17/2018   PLT 229 04/15/2018   LYMPHOPCT 4 07/16/2018   LYMPHOPCT 30.5 08/09/2013   MONOPCT 8 07/16/2018   MONOPCT 11.3 08/09/2013   EOSPCT 4 07/16/2018   EOSPCT 2.5 08/09/2013   BASOPCT 1 07/16/2018   BASOPCT 0.7 08/09/2013    CMP:  Lab Results  Component Value Date   NA 143 07/17/2018   NA 142 04/15/2018   NA 144 08/11/2013   K 5.0 07/17/2018   K 5.7 (H) 08/11/2013   CL 116 (H) 07/17/2018   CL 116 (H) 08/11/2013   CO2 21 (L) 07/17/2018   CO2 22 08/11/2013   BUN 19 07/17/2018   BUN 29 (H) 04/15/2018   BUN 12 08/11/2013   CREATININE 0.87 07/17/2018   CREATININE 1.08 08/11/2013   PROT 7.1 07/16/2018   PROT 6.6 04/15/2018   PROT 7.5 08/08/2013   ALBUMIN 3.9 07/16/2018   ALBUMIN 4.1 04/15/2018   ALBUMIN 3.8 08/08/2013   BILITOT 0.6 07/16/2018   BILITOT <0.2 04/15/2018   BILITOT 0.4 08/08/2013   ALKPHOS 27 (L) 07/16/2018   ALKPHOS 26 (L) 08/08/2013   AST 33 07/16/2018   AST 49 (H) 08/08/2013   ALT 30 07/16/2018   ALT 32 08/08/2013  .   Total Time in preparing paper work, data evaluation and todays exam - 35 minutes  Epifanio Lesches M.D on 07/17/2018 at 3:27 PM    Note: This dictation was prepared with Dragon dictation along with smaller phrase technology. Any transcriptional errors that result from this process are unintentional.

## 2018-07-18 ENCOUNTER — Telehealth: Payer: Self-pay

## 2018-07-18 LAB — HIV ANTIBODY (ROUTINE TESTING W REFLEX): HIV Screen 4th Generation wRfx: NONREACTIVE

## 2018-07-18 NOTE — Telephone Encounter (Signed)
TOC #1. Called pt to f/u after d/c from [facility] on 07/17/18. At the time of this entry, pt has not yet scheduled a hosp f/u appt with Dr. Army Melia. Discharge planning includes the following:  - Begin Vibramycin - F/u PCP 1 week  LVM requesting returned call.

## 2018-07-19 ENCOUNTER — Encounter: Payer: Self-pay | Admitting: *Deleted

## 2018-07-19 ENCOUNTER — Telehealth: Payer: Self-pay

## 2018-07-19 ENCOUNTER — Other Ambulatory Visit: Payer: Self-pay | Admitting: *Deleted

## 2018-07-19 DIAGNOSIS — F4320 Adjustment disorder, unspecified: Secondary | ICD-10-CM | POA: Diagnosis not present

## 2018-07-19 NOTE — Patient Outreach (Signed)
[facility]) Care Management  07/19/2018  Maria Singh 1951/03/04 [id number]    Subjective: Telephone call to patient's home number, spoke with patient, and HIPAA verified.  Discussed [facility] Care Management [facility] Transition of care follow up, patient voiced understanding, and is in agreement to follow up.  Patient states she is doing much better and has received a transition of care follow up call from primary MD's office on 07/18/18.  Patient gave this RNCM verbal authorization to speak with spouse Maria Singh) regarding healthcare needs as needed. States she will call primary MD to set up hospital follow up appointment  within the week, once transportation confirmed with daughter.  Patient states she is able to manage self care and has assistance as needed with activities of daily living / home management. Patient voices understanding of medical diagnosis and treatment plan.  States she is accessing the following [facility] benefits: outpatient pharmacy, hospital indemnity (not sure if chosen, will ask spouse to verify, will file claim if appropriate),  and family medical leave act (FMLA) not needed,   States she is currently retired and daughter is able to take off from work as needed to take her to appointments.  Patient had questions regarding process for obtaining new power wheelchair, current chair is 66 years old, chair is in need of major repairs, and states she has already contacted [facility] to obtain list of in network providers.  States she has chosen [employer] as a possible  provider.  States her current wheelchair is through a vendor in [place] ([employer]) and has limited service to her residence.  RNCM advised patient to follow up with [facility] regarding her benefit questions, process for obtaining new motorized wheelchair, also to follow up with provider that will be ordering wheelchair, follow up with wheelchair vendor, to verify the process for obtaining, and verify how they will  assist her in this process.   Patient voices understanding and states she will follow up.   Patient states she does not have any education material, transition of care, care coordination, disease management, disease monitoring, transportation, community resource, or pharmacy needs at this time.   States she is very appreciative of the follow up and is in agreement to receive [facility] Management information.     Objective: Per KPN (Knowledge Performance Now, point of care tool) and chart review, patient hospitalized 07/16/18 -07/17/18,  Cellulitis of right lower extremity, sepsis.   Patient also has a history of diabetes, hypertension, hyperlipidemia, sleep apnea, Charcot Marie Tooth muscular atrophy, and Rotator cuff injury (left arm).       Assessment: Received [facility] Transition of care referral on 07/18/18.  Transition of care follow up completed, no care management needs, and will proceed with case closure.      Plan: RNCM will send patient successful outreach letter, [facility] pamphlet, and magnet. RNCM will complete case closure due to follow up completed / no care management needs.        Gurneet Matarese H. Annia Friendly, BSN, [place] Management [facility] Telephonic CM Phone: [phone] Fax: [phone]

## 2018-07-19 NOTE — Telephone Encounter (Signed)
TOC #2. Called pt to f/u after d/c from [facility] on 07/17/18. At the time of this entry, pt still has not yet scheduled a hosp f/u appt with Dr. Army Melia. Discharge planning includes the following:  - Begin Vibramycin - F/u PCP 1 week  LVM requesting returned call.

## 2018-07-21 LAB — CULTURE, BLOOD (ROUTINE X 2)
CULTURE: NO GROWTH
Culture: NO GROWTH
Special Requests: ADEQUATE

## 2018-07-26 ENCOUNTER — Encounter: Payer: Self-pay | Admitting: Internal Medicine

## 2018-07-26 ENCOUNTER — Ambulatory Visit (INDEPENDENT_AMBULATORY_CARE_PROVIDER_SITE_OTHER): Payer: 59 | Admitting: Internal Medicine

## 2018-07-26 VITALS — BP 154/72 | HR 72 | Ht 65.0 in

## 2018-07-26 DIAGNOSIS — D509 Iron deficiency anemia, unspecified: Secondary | ICD-10-CM | POA: Diagnosis not present

## 2018-07-26 DIAGNOSIS — A419 Sepsis, unspecified organism: Secondary | ICD-10-CM

## 2018-07-26 DIAGNOSIS — G600 Hereditary motor and sensory neuropathy: Secondary | ICD-10-CM | POA: Diagnosis not present

## 2018-07-26 DIAGNOSIS — E1165 Type 2 diabetes mellitus with hyperglycemia: Secondary | ICD-10-CM | POA: Diagnosis not present

## 2018-07-26 NOTE — Patient Instructions (Signed)
Stop Protonix  Continue Zantac  Continue iron supplements

## 2018-07-26 NOTE — Progress Notes (Signed)
Date:  07/26/2018   Name:  Maria Singh   DOB:  December 22, 1950   MRN:  [id number]   Chief Complaint: Hospitalization Follow-up (Patient states feeling much better.) Hospital follow up. Admitted to [facility] 07/16/18 to 07/17/18 for cellulitis, fever and sepsis syndrome.  She received a TOC call x 2. Summary:  Sepsis present on admission secondary to right leg cellulitis: Received IV fluids, IV antibiotics, patient right leg redness improved, lactic acid level also decreased From 2.5-0.6.  Patient never had a white count elevation.  Hemodynamically stable stable.  No further fevers and has been afebrile.  Discharge home, patient received IV cefepime, doxycycline in the hospital, discharged home with doxycycline 100 mg p.o. twice daily for 10 days. She reports doing much better.  One more day of antibiotics - taking with no side effects. Strength is back to baseline.    Anemia - found last visit to be very iron deficient. She started on iron supplement and has tolerate it well.  In hospital, Hgb was initially higher but then decreased some, but still higher than previously.  She was heme negative in [facility] and has had no blood in her stool.  Colonoscopy would be very difficult for her to manage the prep. She is also taking protonix which could inhibit iron absorption.  GERD - taking PPI and having no reflux.  Meds were started several months ago when she had more sx.  She believes she might be able to manage with Zantac instead.   HPI  Review of Systems  Constitutional: Negative for appetite change, chills and fever.  Respiratory: Negative for cough, chest tightness, shortness of breath and wheezing.   Cardiovascular: Negative for chest pain and palpitations.  Gastrointestinal: Negative for abdominal pain, blood in stool and diarrhea.  Musculoskeletal: Positive for arthralgias, gait problem and myalgias.  Skin: Positive for color change (anterior shins redness). Negative for rash.  Neurological:  Negative for dizziness and headaches.    Patient Active Problem List   Diagnosis Date Noted  . Uncontrolled type 2 diabetes mellitus with hyperglycemia ([place]) 07/26/2018  . GERD (gastroesophageal reflux disease) 04/13/2018  . Major depression, chronic 08/10/2017  . Hyperlipidemia associated with type 2 diabetes mellitus ([place]) 08/10/2017  . Essential hypertension 08/10/2017  . 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 Pain in limb 10/30/2016  . Swelling of limb 10/30/2016    Allergies  Allergen Reactions  . Sulfa Antibiotics Rash    Past Surgical History:  Procedure Laterality Date  . arm surgery Left    broken arm  . HERNIA REPAIR      Social History   Tobacco Use  . Smoking status: Never Smoker  . Smokeless tobacco: Never Used  Substance Use Topics  . Alcohol use: Yes    Alcohol/week: 0.0 standard drinks    Comment: very very rare  . Drug use: No     Medication list has been reviewed and updated.  Current Meds  Medication Sig  . ACCU-CHEK GUIDE test strip CHECK BLOOD SUGAR TWO TIMES DAILY  . ACCU-CHEK GUIDE test strip CHECK BLOOD SUGAR TWO TIMES DAILY  . acetaminophen (TYLENOL) 500 MG tablet Take 500 mg by mouth every 6 (six) hours as needed.  Marland Kitchen aspirin 81 MG tablet Take 81 mg by mouth daily.  Marland Kitchen BAYER MICROLET LANCETS lancets USE AS DIRECTED THREE TIMES A DAY  . clotrimazole-betamethasone (LOTRISONE) cream Apply 1 application topically 2 (two) times daily.  . diclofenac sodium (VOLTAREN) 1 % GEL Apply to area BID  PRN  . docusate sodium (COLACE) 100 MG capsule Take 100 mg by mouth daily.   Marland Kitchen doxycycline (VIBRAMYCIN) 100 MG capsule Take 1 capsule (100 mg total) by mouth 2 (two) times daily.  Marland Kitchen EPINEPHrine 0.3 mg/0.3 mL IJ SOAJ injection Inject 0.3 mLs (0.3 mg total) into the muscle once.  . ferrous sulfate 325 (65 FE) MG tablet Take 1 tablet (325 mg total) by mouth daily with breakfast.  . fluconazole (DIFLUCAN) 150 MG tablet TAKE 1 TABLET BY MOUTH ONCE FOR 1 DOSE. TAKE  1 TODAY AND REPEAT IN 1 WEEK  . HUMALOG KWIKPEN 100 UNIT/ML KiwkPen Inject 0.22 mLs (22 Units total) into the skin 3 (three) times daily.  Marland Kitchen ibuprofen (ADVIL,MOTRIN) 800 MG tablet TAKE ONE TABLET BY MOUTH THREE TIMES A DAY  . Insulin Glargine (LANTUS SOLOSTAR) 100 UNIT/ML Solostar Pen Inject 110 Units into the skin daily at 10 pm. 60 units in am and 50 units in pm (Patient taking differently: Inject 110 Units into the skin daily at 10 pm. 60 units in pm and 50 units in am)  . Insulin Pen Needle (NOVOFINE) 32G X 6 MM MISC USE AS DIRECTED 3 TIMES A DAY  . Lidocaine 0.5 % GEL Apply 1 application topically 2 (two) times daily.  Marland Kitchen liraglutide (VICTOZA) 18 MG/3ML SOPN INJECT 1.8 MLS (10.8 MG TOTAL) INTO THE SKIN ONCE DAILY  . loratadine (CLARITIN) 10 MG tablet Take 10 mg by mouth daily.  Marland Kitchen losartan (COZAAR) 25 MG tablet Take 1 tablet (25 mg total) by mouth daily. Reported on 02/26/2016  . metFORMIN (GLUCOPHAGE) 1000 MG tablet Take 1 tablet (1,000 mg total) by mouth 2 (two) times daily. (Patient taking differently: Take 1,000 mg by mouth 2 (two) times daily. 100 mg in PM and 500 mg in AM)  . metFORMIN (GLUCOPHAGE) 500 MG tablet Take 1 tablet (500 mg total) by mouth every morning.  . mupirocin ointment (BACTROBAN) 2 % APPLY TO AFFECTED AREA(S) AS NEEDED  . NON FORMULARY Auto CPap nightly  . nystatin cream (MYCOSTATIN) APPLY 1 APPLICATION TOPICALLY 2 (TWO) TIMES DAILY.  Marland Kitchen ondansetron (ZOFRAN) 4 MG tablet TAKE 1 TABLET BY MOUTH EVERY 8 HOURS AS NEEDED FOR NAUSEA OR VOMITING.  . pantoprazole (PROTONIX) 40 MG tablet Take 40 mg by mouth daily.  . ranitidine (ZANTAC) 150 MG tablet Take 1 tablet (150 mg total) by mouth 2 (two) times daily.  . rosuvastatin (CRESTOR) 5 MG tablet Take 1 tablet (5 mg total) by mouth daily.  . sertraline (ZOLOFT) 100 MG tablet Take 1 tablet (100 mg total) by mouth daily.  Marland Kitchen triamcinolone cream (KENALOG) 0.5 % Apply 1 application topically 3 (three) times daily.    PHQ 2/9 Scores  07/26/2018 04/13/2018 07/01/2016 02/26/2016  PHQ - 2 Score 0 0 0 0  PHQ- 9 Score 0 0 - -    Physical Exam  Constitutional: She is oriented to person, place, and time. She appears well-developed. No distress.  HENT:  Head: Normocephalic and atraumatic.  Neck: Normal range of motion. Neck supple.  Cardiovascular: Normal rate, regular rhythm and normal heart sounds.  Pulmonary/Chest: Effort normal and breath sounds normal. No respiratory distress.  Musculoskeletal: She exhibits no edema or tenderness.  Lymphadenopathy:    She has no cervical adenopathy.  Neurological: She is alert and oriented to person, place, and time. No cranial nerve deficit.  Upper extremity strength 4+/5 grip; 5/5 biceps Mild decrease in intrinsic muscles of hands Mild spasticity of hands noted Posterior splints both ankles -  not removed  Skin: Skin is warm and dry. No rash noted. There is erythema (both anterior shins).  Psychiatric: She has a normal mood and affect. Her speech is normal and behavior is normal. Thought content normal.  Nursing note and vitals reviewed.   BP (!) 154/72 (BP Location: Left Arm, Patient Position: Sitting, Cuff Size: Large)   Pulse 72   Ht 5\' 5"  (1.651 m)   SpO2 95%   BMI 50.75 kg/m   Assessment and Plan: 1. Sepsis without acute organ dysfunction, due to unspecified organism ([place]) Resolved, finish antibiotcs - Lactic acid, plasma  2. Uncontrolled type 2 diabetes mellitus with hyperglycemia ([facility]) Continue current insulin regimen - Hemoglobin A1c  3. Charcot-Marie disease Non ambulatory but independent at baseline with transfers  4. Iron deficiency anemia, unspecified iron deficiency anemia type Stop protonix and substitute zantac Continue iron and check labs - CBC with Differential/Platelet - Retic - Iron, TIBC and Ferritin Panel - Ambulatory referral to Gastroenterology   Partially dictated using Dragon software. Any errors are unintentional.  Halina Maidens,  MD [facility]  07/26/2018

## 2018-07-27 LAB — CBC WITH DIFFERENTIAL/PLATELET
BASOS: 1 %
Basophils Absolute: 0 10*3/uL (ref 0.0–0.2)
EOS (ABSOLUTE): 0.3 10*3/uL (ref 0.0–0.4)
EOS: 4 %
HEMATOCRIT: 29.9 % — AB (ref 34.0–46.6)
Hemoglobin: 9.4 g/dL — ABNORMAL LOW (ref 11.1–15.9)
IMMATURE GRANULOCYTES: 0 %
Immature Grans (Abs): 0 10*3/uL (ref 0.0–0.1)
Lymphocytes Absolute: 1.7 10*3/uL (ref 0.7–3.1)
Lymphs: 24 %
MCH: 23.7 pg — ABNORMAL LOW (ref 26.6–33.0)
MCHC: 31.4 g/dL — ABNORMAL LOW (ref 31.5–35.7)
MCV: 75 fL — AB (ref 79–97)
MONOS ABS: 0.6 10*3/uL (ref 0.1–0.9)
Monocytes: 9 %
Neutrophils Absolute: 4.3 10*3/uL (ref 1.4–7.0)
Neutrophils: 62 %
Platelets: 238 10*3/uL (ref 150–450)
RBC: 3.97 x10E6/uL (ref 3.77–5.28)
RDW: 21.4 % — ABNORMAL HIGH (ref 12.3–15.4)
WBC: 7 10*3/uL (ref 3.4–10.8)

## 2018-07-27 LAB — RETICULOCYTES: Retic Ct Pct: 1.4 % (ref 0.6–2.6)

## 2018-07-27 LAB — HEMOGLOBIN A1C
ESTIMATED AVERAGE GLUCOSE: 111 mg/dL
Hgb A1c MFr Bld: 5.5 % (ref 4.8–5.6)

## 2018-07-27 LAB — IRON,TIBC AND FERRITIN PANEL
Ferritin: 12 ng/mL — ABNORMAL LOW (ref 15–150)
IRON SATURATION: 5 % — AB (ref 15–55)
Iron: 21 ug/dL — ABNORMAL LOW (ref 27–139)
TIBC: 392 ug/dL (ref 250–450)
UIBC: 371 ug/dL — AB (ref 118–369)

## 2018-07-27 LAB — LACTIC ACID, PLASMA: Lactate, Ven: 20.5 mg/dL (ref 4.8–25.7)

## 2018-08-02 DIAGNOSIS — F4320 Adjustment disorder, unspecified: Secondary | ICD-10-CM | POA: Diagnosis not present

## 2018-08-12 ENCOUNTER — Other Ambulatory Visit: Payer: Self-pay | Admitting: Internal Medicine

## 2018-08-12 DIAGNOSIS — N3000 Acute cystitis without hematuria: Secondary | ICD-10-CM

## 2018-08-12 MED ORDER — DOXYCYCLINE HYCLATE 100 MG PO TABS: 100.0000 mg | ORAL_TABLET | Freq: Two times a day (BID) | ORAL | 0 refills | Status: DC

## 2018-08-15 ENCOUNTER — Other Ambulatory Visit: Payer: Self-pay | Admitting: Family Medicine

## 2018-08-15 DIAGNOSIS — F329 Major depressive disorder, single episode, unspecified: Secondary | ICD-10-CM

## 2018-08-16 DIAGNOSIS — F4320 Adjustment disorder, unspecified: Secondary | ICD-10-CM | POA: Diagnosis not present

## 2018-08-24 ENCOUNTER — Other Ambulatory Visit: Payer: Self-pay | Admitting: Family Medicine

## 2018-08-24 DIAGNOSIS — E109 Type 1 diabetes mellitus without complications: Secondary | ICD-10-CM

## 2018-08-24 DIAGNOSIS — I10 Essential (primary) hypertension: Secondary | ICD-10-CM

## 2018-08-24 DIAGNOSIS — E782 Mixed hyperlipidemia: Secondary | ICD-10-CM

## 2018-08-30 DIAGNOSIS — F4320 Adjustment disorder, unspecified: Secondary | ICD-10-CM | POA: Diagnosis not present

## 2018-08-31 ENCOUNTER — Ambulatory Visit: Payer: Medicare Other | Admitting: Gastroenterology

## 2018-09-05 ENCOUNTER — Other Ambulatory Visit: Payer: Self-pay | Admitting: Family Medicine

## 2018-09-05 DIAGNOSIS — B372 Candidiasis of skin and nail: Secondary | ICD-10-CM

## 2018-09-13 DIAGNOSIS — F4320 Adjustment disorder, unspecified: Secondary | ICD-10-CM | POA: Diagnosis not present

## 2018-09-27 DIAGNOSIS — F4320 Adjustment disorder, unspecified: Secondary | ICD-10-CM | POA: Diagnosis not present

## 2018-10-05 DIAGNOSIS — E119 Type 2 diabetes mellitus without complications: Secondary | ICD-10-CM | POA: Diagnosis not present

## 2018-10-05 LAB — HM DIABETES EYE EXAM

## 2018-10-06 ENCOUNTER — Other Ambulatory Visit: Payer: Self-pay | Admitting: Family Medicine

## 2018-10-06 DIAGNOSIS — E109 Type 1 diabetes mellitus without complications: Secondary | ICD-10-CM

## 2018-10-07 ENCOUNTER — Encounter: Payer: Self-pay | Admitting: Internal Medicine

## 2018-10-07 DIAGNOSIS — F4320 Adjustment disorder, unspecified: Secondary | ICD-10-CM | POA: Diagnosis not present

## 2018-10-10 ENCOUNTER — Other Ambulatory Visit: Payer: Self-pay

## 2018-10-10 DIAGNOSIS — R109 Unspecified abdominal pain: Secondary | ICD-10-CM

## 2018-10-10 DIAGNOSIS — R110 Nausea: Secondary | ICD-10-CM

## 2018-10-10 MED ORDER — ONDANSETRON HCL 4 MG PO TABS: ORAL_TABLET | ORAL | 0 refills | Status: DC

## 2018-10-10 MED ORDER — CEPHALEXIN 500 MG PO CAPS: 500.0000 mg | ORAL_CAPSULE | Freq: Four times a day (QID) | ORAL | 0 refills | Status: DC

## 2018-10-24 ENCOUNTER — Ambulatory Visit: Payer: Medicare Other | Admitting: Gastroenterology

## 2018-10-25 DIAGNOSIS — F4320 Adjustment disorder, unspecified: Secondary | ICD-10-CM | POA: Diagnosis not present

## 2018-11-03 ENCOUNTER — Other Ambulatory Visit: Payer: Self-pay | Admitting: Internal Medicine

## 2018-11-08 DIAGNOSIS — F4320 Adjustment disorder, unspecified: Secondary | ICD-10-CM | POA: Diagnosis not present

## 2018-11-11 ENCOUNTER — Other Ambulatory Visit: Payer: Self-pay | Admitting: Family Medicine

## 2018-11-11 DIAGNOSIS — K219 Gastro-esophageal reflux disease without esophagitis: Secondary | ICD-10-CM

## 2018-11-22 DIAGNOSIS — F4320 Adjustment disorder, unspecified: Secondary | ICD-10-CM | POA: Diagnosis not present

## 2018-11-23 ENCOUNTER — Ambulatory Visit (INDEPENDENT_AMBULATORY_CARE_PROVIDER_SITE_OTHER): Payer: 59 | Admitting: Gastroenterology

## 2018-11-23 ENCOUNTER — Encounter: Payer: Self-pay | Admitting: Gastroenterology

## 2018-11-23 VITALS — BP 167/63 | HR 78 | Ht 65.0 in | Wt 305.0 lb

## 2018-11-23 DIAGNOSIS — D500 Iron deficiency anemia secondary to blood loss (chronic): Secondary | ICD-10-CM

## 2018-11-23 NOTE — Progress Notes (Signed)
Gastroenterology Consultation  Referring Provider:     Glean Hess, MD Primary Care Physician:  Glean Hess, MD Primary Gastroenterologist:  Dr. Allen Norris     Reason for Consultation:     Iron deficiency anemia        HPI:   Maria Singh is a 68 y.o. y/o female referred for consultation & management of Iron deficiency anemia by Dr. Army Melia, Jesse Sans, MD.  This patient comes in today with a long-standing history of anemia.  The patient's hemoglobin back in October 2019 was 9.3 and in June 2028 was 7.6.  The patient was also anemic back in August 2016 with a hemoglobin of 10.7.  The patient has been noted to have a low MCV and her iron studies were also abnormal. Her saturation was 5% 7 months ago and again 4 months ago with a low ferritin.  The patient did have her stools checked for blood and it was reported to be negative.  The patient denies ever having a colonoscopy in the past.  She states that it is because the prep would be hard for her since she is in a wheelchair most of the time. The patient denies any unexplained weight loss fevers chills nausea or vomiting.  The patient does report that certain foods will make her stools looser.  She reports that she had seafood recently which is rare for her since her daughter has a seafood allergy.  She states that after she had the seafood she then paid for with irregular bowel movements.  There is no report of any black stools or bloody stools.  She denies any dysphagia or chronic heartburn.  She does report that certain foods can cause her to have heartburn.  Past Medical History:  Diagnosis Date  . 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 Depression   . Diabetes mellitus without complication ([place])   . GERD (gastroesophageal reflux disease)   . Hyperlipidemia   . Hypertension   . Rotator cuff injury Nov. 2012   left arm  . Sepsis ([place]) 07/16/2018  . Sleep apnea     Past Surgical History:  Procedure Laterality Date  . arm  surgery Left    broken arm  . HERNIA REPAIR      Prior to Admission medications   Medication Sig Start Date End Date Taking? Authorizing Provider  ACCU-CHEK GUIDE test strip CHECK BLOOD SUGAR TWO TIMES DAILY 01/05/18  Yes Juline Patch, MD  ACCU-CHEK GUIDE test strip CHECK BLOOD SUGAR TWO TIMES DAILY 07/11/18  Yes Juline Patch, MD  acetaminophen (TYLENOL) 500 MG tablet Take 500 mg by mouth every 6 (six) hours as needed.   Yes [provider]  aspirin 81 MG tablet Take 81 mg by mouth daily.   Yes [provider]  BAYER MICROLET LANCETS lancets USE AS DIRECTED THREE TIMES A DAY 08/10/16  Yes Juline Patch, MD  clotrimazole-betamethasone (LOTRISONE) cream Apply 1 application topically 2 (two) times daily. 04/26/18  Yes Juline Patch, MD  docusate sodium (COLACE) 100 MG capsule Take 100 mg by mouth daily.    Yes [provider]  doxycycline (VIBRA-TABS) 100 MG tablet Take 1 tablet (100 mg total) by mouth 2 (two) times daily. 08/12/18  Yes Glean Hess, MD  ferrous sulfate 325 (65 FE) MG tablet Take 1 tablet (325 mg total) by mouth daily with breakfast. 04/26/18  Yes Juline Patch, MD  HUMALOG KWIKPEN 100 UNIT/ML KwikPen INJECT  22 UNITS TOTAL INTO THE SKIN 3 THREE TIMES DAILY. 10/06/18  Yes Glean Hess, MD  ibuprofen (ADVIL,MOTRIN) 800 MG tablet TAKE ONE TABLET BY MOUTH THREE TIMES A DAY 10/06/18  Yes Glean Hess, MD  Insulin Pen Needle (NOVOFINE) 32G X 6 MM MISC USE AS DIRECTED 3 TIMES A DAY 08/10/17  Yes Juline Patch, MD  LANTUS SOLOSTAR 100 UNIT/ML Solostar Pen INJECT 60 UNITS UNDER THE SKIN IN THE MORNING AND 50 UNITS UNDER THE SKIN IN THE EVENING 08/24/18  Yes Juline Patch, MD  liraglutide (VICTOZA) 18 MG/3ML SOPN INJECT 1.8 MLS (10.8 MG TOTAL) INTO THE SKIN ONCE DAILY 04/26/18  Yes Juline Patch, MD  loratadine (CLARITIN) 10 MG tablet Take 10 mg by mouth daily.   Yes [provider]  losartan (COZAAR) 25 MG tablet TAKE 1 TABLET BY  MOUTH DAILY. 08/24/18  Yes Juline Patch, MD  metFORMIN (GLUCOPHAGE) 1000 MG tablet Take 1 tablet (1,000 mg total) by mouth 2 (two) times daily. Patient taking differently: Take 1,000 mg by mouth 2 (two) times daily. 100 mg in PM and 500 mg in AM 08/10/17  Yes Juline Patch, MD  metFORMIN (GLUCOPHAGE) 500 MG tablet Take 1 tablet (500 mg total) by mouth every morning. 08/10/17  Yes Juline Patch, MD  mupirocin ointment (BACTROBAN) 2 % APPLY TO AFFECTED AREA(S) AS NEEDED 11/03/18  Yes Glean Hess, MD  NON FORMULARY Auto CPap nightly   Yes [provider]  nystatin cream (MYCOSTATIN) APPLY 1 APPLICATION TOPICALLY 2 (TWO) TIMES DAILY. 07/01/18  Yes Juline Patch, MD  ondansetron (ZOFRAN) 4 MG tablet TAKE 1 TABLET BY MOUTH EVERY 8 HOURS AS NEEDED FOR NAUSEA OR VOMITING. 10/10/18  Yes Juline Patch, MD  pantoprazole (PROTONIX) 40 MG tablet TAKE 1 TABLET BY MOUTH DAILY. 11/11/18  Yes Glean Hess, MD  ranitidine (ZANTAC) 150 MG tablet Take 1 tablet (150 mg total) by mouth 2 (two) times daily. 05/03/18  Yes Glean Hess, MD  rosuvastatin (CRESTOR) 5 MG tablet TAKE 1 TABLET BY MOUTH DAILY. 08/24/18  Yes Juline Patch, MD  sertraline (ZOLOFT) 100 MG tablet TAKE 1 TABLET BY MOUTH DAILY. 08/15/18  Yes Glean Hess, MD  diclofenac sodium (VOLTAREN) 1 % GEL Apply to area BID PRN Patient not taking: Reported on 11/23/2018 12/27/15   Juline Patch, MD  EPINEPHrine 0.3 mg/0.3 mL IJ SOAJ injection Inject 0.3 mLs (0.3 mg total) into the muscle once. Patient not taking: Reported on 11/23/2018 06/05/15   Juline Patch, MD  Lidocaine 0.5 % GEL Apply 1 application topically 2 (two) times daily. Patient not taking: Reported on 11/23/2018 05/25/18   Juline Patch, MD  triamcinolone cream (KENALOG) 0.5 % Apply 1 application topically 3 (three) times daily. Patient not taking: Reported on 11/23/2018 07/09/15   Juline Patch, MD    Family History  Problem Relation Age of Onset  . Cancer  Mother   . Heart disease Father   . Diabetes Maternal Grandmother   . Heart disease Paternal Grandfather   . Breast cancer Maternal Aunt        mat great aunt     Social History   Tobacco Use  . Smoking status: Never Smoker  . Smokeless tobacco: Never Used  Substance Use Topics  . Alcohol use: Yes    Alcohol/week: 0.0 standard drinks    Comment: very very rare  . Drug use: No    Allergies  as of 11/23/2018 - Review Complete 11/23/2018  Allergen Reaction Noted  . Sulfa antibiotics Rash 05/21/2015    Review of Systems:    All systems reviewed and negative except where noted in HPI.   Physical Exam:  BP (!) 167/63   Pulse 78   Ht 5\' 5"  (1.651 m)   Wt (!) 305 lb (138.3 kg)   BMI 50.75 kg/m  No LMP recorded. Patient is postmenopausal. General:   Alert,  Obese, well-nourished, pleasant and cooperative in NAD Head:  Normocephalic and atraumatic. Eyes:  Sclera clear, no icterus.   Conjunctiva pink. Ears:  Normal auditory acuity. Nose:  No deformity, discharge, or lesions. Mouth:  No deformity or lesions,oropharynx pink & moist. Neck:  Supple; no masses or thyromegaly. Lungs:  Respirations even and unlabored.  Clear throughout to auscultation.   No wheezes, crackles, or rhonchi. No acute distress. Heart:  Regular rate and rhythm; no murmurs, clicks, rubs, or gallops. Abdomen:  Normal bowel sounds.  No bruits.  Soft, non-tender and non-distended without masses, hepatosplenomegaly or hernias noted.  No guarding or rebound tenderness.  Negative Carnett sign.   Rectal:  Deferred.  Msk:  Symmetrical without gross deformities.  Good, equal movement & strength bilaterally. Pulses:  Normal pulses noted. Extremities:  No clubbing or edema.  No cyanosis. Neurologic:  Alert and oriented x3;  Patient in a wheelchair. Skin:  Intact without significant lesions or rashes.  No jaundice. Lymph Nodes:  No significant cervical adenopathy. Psych:  Alert and cooperative. Normal mood and  affect.  Imaging Studies: No results found.  Assessment and Plan:   Emaline Genisis Sonnier is a 68 y.o. y/o female who comes in today with a history of long-standing anemia with a low MCV and low iron studies.  The patient's iron has been 24 seven months ago and 21 four months ago with a saturation stable at 5%.  The patient also has a low ferritin consistent with the patient's iron deficiency anemia.  The patient has never had a colonoscopy. The patient has been told that the workup for her iron deficiency anemia should include a EGD and colonoscopy.  The patient has been explained the risks of colon polyps or colon cancer as the cause of her anemia as well as other more proximal GI pathologies that can occur in the stomach and small bowel.  The patient states she would like to think about undergoing these procedures before setting him up and will contact our office if she decides to proceed with the EGD and colonoscopy.  The patient has been explained the plan and agrees with it.  Lucilla Lame, MD. Marval Regal    Note: This dictation was prepared with Dragon dictation along with smaller phrase technology. Any transcriptional errors that result from this process are unintentional.

## 2018-11-25 ENCOUNTER — Inpatient Hospital Stay
Admission: EM | Admit: 2018-11-25 | Discharge: 2018-11-29 | DRG: 872 | Disposition: A | Discharge status: Home Health | Payer: 59 | Attending: Internal Medicine | Admitting: Internal Medicine

## 2018-11-25 ENCOUNTER — Other Ambulatory Visit: Payer: Self-pay | Admitting: Internal Medicine

## 2018-11-25 ENCOUNTER — Emergency Department: Payer: 59

## 2018-11-25 ENCOUNTER — Other Ambulatory Visit: Payer: Self-pay

## 2018-11-25 DIAGNOSIS — I10 Essential (primary) hypertension: Secondary | ICD-10-CM | POA: Diagnosis not present

## 2018-11-25 DIAGNOSIS — G4730 Sleep apnea, unspecified: Secondary | ICD-10-CM | POA: Diagnosis present

## 2018-11-25 DIAGNOSIS — G600 Hereditary motor and sensory neuropathy: Secondary | ICD-10-CM | POA: Diagnosis present

## 2018-11-25 DIAGNOSIS — J9811 Atelectasis: Secondary | ICD-10-CM | POA: Diagnosis not present

## 2018-11-25 DIAGNOSIS — E785 Hyperlipidemia, unspecified: Secondary | ICD-10-CM | POA: Diagnosis present

## 2018-11-25 DIAGNOSIS — L03116 Cellulitis of left lower limb: Secondary | ICD-10-CM | POA: Diagnosis present

## 2018-11-25 DIAGNOSIS — A414 Sepsis due to anaerobes: Secondary | ICD-10-CM | POA: Diagnosis present

## 2018-11-25 DIAGNOSIS — Z803 Family history of malignant neoplasm of breast: Secondary | ICD-10-CM | POA: Diagnosis not present

## 2018-11-25 DIAGNOSIS — E11628 Type 2 diabetes mellitus with other skin complications: Secondary | ICD-10-CM | POA: Diagnosis present

## 2018-11-25 DIAGNOSIS — Z794 Long term (current) use of insulin: Secondary | ICD-10-CM

## 2018-11-25 DIAGNOSIS — Z833 Family history of diabetes mellitus: Secondary | ICD-10-CM

## 2018-11-25 DIAGNOSIS — L03119 Cellulitis of unspecified part of limb: Secondary | ICD-10-CM

## 2018-11-25 DIAGNOSIS — A419 Sepsis, unspecified organism: Secondary | ICD-10-CM | POA: Diagnosis present

## 2018-11-25 DIAGNOSIS — Z9989 Dependence on other enabling machines and devices: Secondary | ICD-10-CM

## 2018-11-25 DIAGNOSIS — Z882 Allergy status to sulfonamides status: Secondary | ICD-10-CM

## 2018-11-25 DIAGNOSIS — Z7982 Long term (current) use of aspirin: Secondary | ICD-10-CM | POA: Diagnosis not present

## 2018-11-25 DIAGNOSIS — E119 Type 2 diabetes mellitus without complications: Secondary | ICD-10-CM | POA: Diagnosis present

## 2018-11-25 DIAGNOSIS — R5383 Other fatigue: Secondary | ICD-10-CM | POA: Diagnosis not present

## 2018-11-25 DIAGNOSIS — Z79899 Other long term (current) drug therapy: Secondary | ICD-10-CM

## 2018-11-25 DIAGNOSIS — A498 Other bacterial infections of unspecified site: Secondary | ICD-10-CM | POA: Diagnosis not present

## 2018-11-25 DIAGNOSIS — E1169 Type 2 diabetes mellitus with other specified complication: Secondary | ICD-10-CM | POA: Diagnosis present

## 2018-11-25 DIAGNOSIS — E109 Type 1 diabetes mellitus without complications: Secondary | ICD-10-CM

## 2018-11-25 DIAGNOSIS — A0472 Enterocolitis due to Clostridium difficile, not specified as recurrent: Secondary | ICD-10-CM | POA: Diagnosis present

## 2018-11-25 DIAGNOSIS — E1165 Type 2 diabetes mellitus with hyperglycemia: Secondary | ICD-10-CM

## 2018-11-25 DIAGNOSIS — E875 Hyperkalemia: Secondary | ICD-10-CM | POA: Diagnosis not present

## 2018-11-25 DIAGNOSIS — N3000 Acute cystitis without hematuria: Secondary | ICD-10-CM

## 2018-11-25 DIAGNOSIS — Z8249 Family history of ischemic heart disease and other diseases of the circulatory system: Secondary | ICD-10-CM | POA: Diagnosis not present

## 2018-11-25 DIAGNOSIS — K219 Gastro-esophageal reflux disease without esophagitis: Secondary | ICD-10-CM | POA: Diagnosis present

## 2018-11-25 DIAGNOSIS — F324 Major depressive disorder, single episode, in partial remission: Secondary | ICD-10-CM | POA: Diagnosis present

## 2018-11-25 DIAGNOSIS — G4733 Obstructive sleep apnea (adult) (pediatric): Secondary | ICD-10-CM

## 2018-11-25 DIAGNOSIS — Z888 Allergy status to other drugs, medicaments and biological substances status: Secondary | ICD-10-CM | POA: Diagnosis not present

## 2018-11-25 DIAGNOSIS — R531 Weakness: Secondary | ICD-10-CM | POA: Diagnosis not present

## 2018-11-25 LAB — LACTIC ACID, PLASMA: Lactic Acid, Venous: 1.6 mmol/L (ref 0.5–1.9)

## 2018-11-25 LAB — COMPREHENSIVE METABOLIC PANEL
ALT: 29 U/L (ref 0–44)
AST: 26 U/L (ref 15–41)
Albumin: 4 g/dL (ref 3.5–5.0)
Alkaline Phosphatase: 31 U/L — ABNORMAL LOW (ref 38–126)
Anion gap: 8 (ref 5–15)
BUN: 28 mg/dL — AB (ref 8–23)
CO2: 21 mmol/L — ABNORMAL LOW (ref 22–32)
Calcium: 8.8 mg/dL — ABNORMAL LOW (ref 8.9–10.3)
Chloride: 106 mmol/L (ref 98–111)
Creatinine, Ser: 0.83 mg/dL (ref 0.44–1.00)
GFR calc Af Amer: >60 mL/min (ref >60)
Glucose, Bld: 141 mg/dL — ABNORMAL HIGH (ref 70–99)
Potassium: 6.1 mmol/L — ABNORMAL HIGH (ref 3.5–5.1)
Sodium: 135 mmol/L (ref 135–145)
Total Bilirubin: 0.5 mg/dL (ref 0.3–1.2)
Total Protein: 7.8 g/dL (ref 6.5–8.1)

## 2018-11-25 LAB — CBC WITH DIFFERENTIAL/PLATELET
Abs Immature Granulocytes: 0.06 10*3/uL (ref 0.00–0.07)
Basophils Absolute: 0 10*3/uL (ref 0.0–0.1)
Basophils Relative: 0 %
EOS PCT: 1 %
Eosinophils Absolute: 0.1 10*3/uL (ref 0.0–0.5)
HEMATOCRIT: 32.2 % — AB (ref 36.0–46.0)
Hemoglobin: 9.7 g/dL — ABNORMAL LOW (ref 12.0–15.0)
Immature Granulocytes: 1 %
Lymphocytes Relative: 4 %
Lymphs Abs: 0.4 10*3/uL — ABNORMAL LOW (ref 0.7–4.0)
MCH: 24.6 pg — ABNORMAL LOW (ref 26.0–34.0)
MCHC: 30.1 g/dL (ref 30.0–36.0)
MCV: 81.5 fL (ref 80.0–100.0)
MONO ABS: 0.6 10*3/uL (ref 0.1–1.0)
Monocytes Relative: 6 %
Neutro Abs: 9 10*3/uL — ABNORMAL HIGH (ref 1.7–7.7)
Neutrophils Relative %: 88 %
Platelets: 214 10*3/uL (ref 150–400)
RBC: 3.95 MIL/uL (ref 3.87–5.11)
RDW: 17.2 % — ABNORMAL HIGH (ref 11.5–15.5)
WBC: 10.3 10*3/uL (ref 4.0–10.5)
nRBC: 0 % (ref 0.0–0.2)

## 2018-11-25 LAB — URINALYSIS, ROUTINE W REFLEX MICROSCOPIC
Bacteria, UA: NONE SEEN
Bilirubin Urine: NEGATIVE
Glucose, UA: NEGATIVE mg/dL
Ketones, ur: NEGATIVE mg/dL
Leukocytes, UA: NEGATIVE
Nitrite: NEGATIVE
Protein, ur: NEGATIVE mg/dL
Specific Gravity, Urine: 1.014 (ref 1.005–1.030)
WBC, UA: NONE SEEN WBC/hpf (ref 0–5)
pH: 5 (ref 5.0–8.0)

## 2018-11-25 LAB — GLUCOSE, CAPILLARY: Glucose-Capillary: 99 mg/dL (ref 70–99)

## 2018-11-25 LAB — INFLUENZA PANEL BY PCR (TYPE A & B)
INFLAPCR: NEGATIVE
Influenza B By PCR: NEGATIVE

## 2018-11-25 LAB — CK: Total CK: 434 U/L — ABNORMAL HIGH (ref 38–234)

## 2018-11-25 MED ORDER — SODIUM CHLORIDE 0.9 % IV SOLN
1.0000 g | Freq: Two times a day (BID) | INTRAVENOUS | Status: DC
  Administered 2018-11-26 – 2018-11-27 (×3): 1 g via INTRAVENOUS
  Filled 2018-11-25 (×4): qty 1

## 2018-11-25 MED ORDER — INSULIN ASPART 100 UNIT/ML ~~LOC~~ SOLN
0.0000 [IU] | Freq: Three times a day (TID) | SUBCUTANEOUS | Status: DC
  Administered 2018-11-26 – 2018-11-28 (×5): 2 [IU] via SUBCUTANEOUS
  Administered 2018-11-29 (×2): 1 [IU] via SUBCUTANEOUS
  Filled 2018-11-25 (×6): qty 1

## 2018-11-25 MED ORDER — ACETAMINOPHEN 650 MG RE SUPP: 650.0000 mg | Freq: Four times a day (QID) | RECTAL | Status: DC | PRN

## 2018-11-25 MED ORDER — ROSUVASTATIN CALCIUM 10 MG PO TABS
5.0000 mg | ORAL_TABLET | Freq: Every day | ORAL | Status: DC
  Administered 2018-11-26 – 2018-11-29 (×4): 5 mg via ORAL
  Filled 2018-11-25 (×4): qty 1

## 2018-11-25 MED ORDER — INSULIN ASPART 100 UNIT/ML ~~LOC~~ SOLN: 0.0000 [IU] | Freq: Every day | SUBCUTANEOUS | Status: DC

## 2018-11-25 MED ORDER — VANCOMYCIN HCL 10 G IV SOLR
2000.0000 mg | INTRAVENOUS | Status: DC
  Filled 2018-11-25: qty 2000

## 2018-11-25 MED ORDER — SODIUM CHLORIDE 0.9 % IV BOLUS
1000.0000 mL | Freq: Once | INTRAVENOUS | Status: AC
  Administered 2018-11-25: 1000 mL via INTRAVENOUS

## 2018-11-25 MED ORDER — OXYCODONE HCL 5 MG PO TABS: 5.0000 mg | ORAL_TABLET | ORAL | Status: DC | PRN

## 2018-11-25 MED ORDER — ENOXAPARIN SODIUM 40 MG/0.4ML ~~LOC~~ SOLN
40.0000 mg | Freq: Two times a day (BID) | SUBCUTANEOUS | Status: DC
  Administered 2018-11-25 – 2018-11-29 (×8): 40 mg via SUBCUTANEOUS
  Filled 2018-11-25 (×8): qty 0.4

## 2018-11-25 MED ORDER — VANCOMYCIN HCL IN DEXTROSE 1-5 GM/200ML-% IV SOLN
1000.0000 mg | Freq: Once | INTRAVENOUS | Status: AC
  Administered 2018-11-25: 1000 mg via INTRAVENOUS
  Filled 2018-11-25: qty 200

## 2018-11-25 MED ORDER — METRONIDAZOLE IN NACL 5-0.79 MG/ML-% IV SOLN
500.0000 mg | Freq: Three times a day (TID) | INTRAVENOUS | Status: DC
  Administered 2018-11-25 – 2018-11-26 (×3): 500 mg via INTRAVENOUS
  Filled 2018-11-25 (×5): qty 100

## 2018-11-25 MED ORDER — PANTOPRAZOLE SODIUM 40 MG PO TBEC
40.0000 mg | DELAYED_RELEASE_TABLET | Freq: Every day | ORAL | Status: DC
  Administered 2018-11-26 – 2018-11-29 (×4): 40 mg via ORAL
  Filled 2018-11-25 (×4): qty 1

## 2018-11-25 MED ORDER — SERTRALINE HCL 50 MG PO TABS
100.0000 mg | ORAL_TABLET | Freq: Every day | ORAL | Status: DC
  Administered 2018-11-26 – 2018-11-29 (×4): 100 mg via ORAL
  Filled 2018-11-25 (×4): qty 2

## 2018-11-25 MED ORDER — LOSARTAN POTASSIUM 25 MG PO TABS
25.0000 mg | ORAL_TABLET | Freq: Every day | ORAL | Status: DC
  Administered 2018-11-26 – 2018-11-29 (×4): 25 mg via ORAL
  Filled 2018-11-25 (×3): qty 1

## 2018-11-25 MED ORDER — ACETAMINOPHEN 325 MG PO TABS
650.0000 mg | ORAL_TABLET | Freq: Four times a day (QID) | ORAL | Status: DC | PRN
  Administered 2018-11-25 – 2018-11-27 (×4): 650 mg via ORAL
  Filled 2018-11-25 (×4): qty 2

## 2018-11-25 MED ORDER — SODIUM CHLORIDE 0.9 % IV SOLN
2.0000 g | Freq: Once | INTRAVENOUS | Status: AC
  Administered 2018-11-25: 2 g via INTRAVENOUS
  Filled 2018-11-25: qty 2

## 2018-11-25 MED ORDER — VANCOMYCIN HCL 10 G IV SOLR
1500.0000 mg | Freq: Once | INTRAVENOUS | Status: AC
  Administered 2018-11-25: 1500 mg via INTRAVENOUS
  Filled 2018-11-25: qty 1500

## 2018-11-25 MED ORDER — ONDANSETRON HCL 4 MG/2ML IJ SOLN: 4.0000 mg | Freq: Four times a day (QID) | INTRAMUSCULAR | Status: DC | PRN

## 2018-11-25 MED ORDER — DOXYCYCLINE HYCLATE 100 MG PO TABS: 100.0000 mg | ORAL_TABLET | Freq: Two times a day (BID) | ORAL | 0 refills | Status: DC

## 2018-11-25 MED ORDER — ASPIRIN EC 81 MG PO TBEC
81.0000 mg | DELAYED_RELEASE_TABLET | Freq: Every day | ORAL | Status: DC
  Administered 2018-11-26 – 2018-11-29 (×4): 81 mg via ORAL
  Filled 2018-11-25 (×4): qty 1

## 2018-11-25 MED ORDER — ONDANSETRON HCL 4 MG PO TABS: 4.0000 mg | ORAL_TABLET | Freq: Four times a day (QID) | ORAL | Status: DC | PRN

## 2018-11-25 NOTE — ED Provider Notes (Signed)
[facility] Emergency Department Provider Note  Time seen: 5:25 PM  I have reviewed the triage vital signs and the nursing notes.   HISTORY  Chief Complaint Fever    HPI Maria Singh is a 68 y.o. female with a past medical history of Charcot-Marie-Tooth disease, diabetes, gastric reflux, hypertension, hyperlipidemia generalized weakness, presents to the emergency department for increased weakness fatigue and fever.  According to the patient since yesterday she has felt very weak with subjective fever at home, 101 per EMS.  Patient denies any chest pain or abdominal pain, nausea vomiting, diarrhea, dysuria or hematuria.  Patient states in the past when she has developed fevers due to the Charcot-Marie-Tooth disease she will occasionally get rhabdomyolysis and lactic acidosis, was concerned that this could be going on so she came to the emergency department for evaluation.   Past Medical History:  Diagnosis Date  . 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 Depression   . Diabetes mellitus without complication ([place])   . GERD (gastroesophageal reflux disease)   . Hyperlipidemia   . Hypertension   . Rotator cuff injury Nov. 2012   left arm  . Sepsis ([place]) 07/16/2018  . Sleep apnea     Patient Active Problem List   Diagnosis Date Noted  . Uncontrolled type 2 diabetes mellitus with hyperglycemia ([place]) 07/26/2018  . GERD (gastroesophageal reflux disease) 04/13/2018  . Major depression, chronic 08/10/2017  . Hyperlipidemia associated with type 2 diabetes mellitus ([place]) 08/10/2017  . Essential hypertension 08/10/2017  . 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 Pain in limb 10/30/2016  . Swelling of limb 10/30/2016    Past Surgical History:  Procedure Laterality Date  . arm surgery Left    broken arm  . HERNIA REPAIR      Prior to Admission medications   Medication Sig Start Date End Date Taking? Authorizing Provider  ACCU-CHEK GUIDE test strip CHECK  BLOOD SUGAR TWO TIMES DAILY 01/05/18   Juline Patch, MD  ACCU-CHEK GUIDE test strip CHECK BLOOD SUGAR TWO TIMES DAILY 07/11/18   Juline Patch, MD  acetaminophen (TYLENOL) 500 MG tablet Take 500 mg by mouth every 6 (six) hours as needed.    [provider]  aspirin 81 MG tablet Take 81 mg by mouth daily.    [provider]  BAYER MICROLET LANCETS lancets USE AS DIRECTED THREE TIMES A DAY 08/10/16   Juline Patch, MD  clotrimazole-betamethasone (LOTRISONE) cream Apply 1 application topically 2 (two) times daily. 04/26/18   Juline Patch, MD  diclofenac sodium (VOLTAREN) 1 % GEL Apply to area BID PRN Patient not taking: Reported on 11/23/2018 12/27/15   Juline Patch, MD  docusate sodium (COLACE) 100 MG capsule Take 100 mg by mouth daily.     [provider]  doxycycline (VIBRA-TABS) 100 MG tablet Take 1 tablet (100 mg total) by mouth 2 (two) times daily for 10 days. 11/25/18 12/05/18  Glean Hess, MD  EPINEPHrine 0.3 mg/0.3 mL IJ SOAJ injection Inject 0.3 mLs (0.3 mg total) into the muscle once. Patient not taking: Reported on 11/23/2018 06/05/15   Juline Patch, MD  ferrous sulfate 325 (65 FE) MG tablet Take 1 tablet (325 mg total) by mouth daily with breakfast. 04/26/18   Juline Patch, MD  HUMALOG KWIKPEN 100 UNIT/ML KwikPen INJECT 22 UNITS TOTAL INTO THE SKIN 3 THREE TIMES DAILY. 10/06/18   Glean Hess, MD  ibuprofen (ADVIL,MOTRIN) 800 MG tablet TAKE ONE  TABLET BY MOUTH THREE TIMES A DAY 10/06/18   Glean Hess, MD  Insulin Pen Needle (NOVOFINE) 32G X 6 MM MISC USE AS DIRECTED 3 TIMES A DAY 08/10/17   Juline Patch, MD  LANTUS SOLOSTAR 100 UNIT/ML Solostar Pen INJECT 60 UNITS UNDER THE SKIN IN THE MORNING AND 50 UNITS UNDER THE SKIN IN THE EVENING 08/24/18   Otilio Miu C, MD  Lidocaine 0.5 % GEL Apply 1 application topically 2 (two) times daily. Patient not taking: Reported on 11/23/2018 05/25/18   Juline Patch, MD  liraglutide (VICTOZA) 18 MG/3ML  SOPN INJECT 1.8 MLS (10.8 MG TOTAL) INTO THE SKIN ONCE DAILY 04/26/18   Juline Patch, MD  loratadine (CLARITIN) 10 MG tablet Take 10 mg by mouth daily.    [provider]  losartan (COZAAR) 25 MG tablet TAKE 1 TABLET BY MOUTH DAILY. 08/24/18   Juline Patch, MD  metFORMIN (GLUCOPHAGE) 1000 MG tablet Take 1 tablet (1,000 mg total) by mouth 2 (two) times daily. Patient taking differently: Take 1,000 mg by mouth 2 (two) times daily. 100 mg in PM and 500 mg in AM 08/10/17   Juline Patch, MD  metFORMIN (GLUCOPHAGE) 500 MG tablet Take 1 tablet (500 mg total) by mouth every morning. 08/10/17   Juline Patch, MD  mupirocin ointment (BACTROBAN) 2 % APPLY TO AFFECTED AREA(S) AS NEEDED 11/03/18   Glean Hess, MD  NON FORMULARY Auto CPap nightly    [provider]  nystatin cream (MYCOSTATIN) APPLY 1 APPLICATION TOPICALLY 2 (TWO) TIMES DAILY. 07/01/18   Juline Patch, MD  ondansetron (ZOFRAN) 4 MG tablet TAKE 1 TABLET BY MOUTH EVERY 8 HOURS AS NEEDED FOR NAUSEA OR VOMITING. 10/10/18   Juline Patch, MD  pantoprazole (PROTONIX) 40 MG tablet TAKE 1 TABLET BY MOUTH DAILY. 11/11/18   Glean Hess, MD  ranitidine (ZANTAC) 150 MG tablet Take 1 tablet (150 mg total) by mouth 2 (two) times daily. 05/03/18   Glean Hess, MD  rosuvastatin (CRESTOR) 5 MG tablet TAKE 1 TABLET BY MOUTH DAILY. 08/24/18   Juline Patch, MD  sertraline (ZOLOFT) 100 MG tablet TAKE 1 TABLET BY MOUTH DAILY. 08/15/18   Glean Hess, MD  triamcinolone cream (KENALOG) 0.5 % Apply 1 application topically 3 (three) times daily. Patient not taking: Reported on 11/23/2018 07/09/15   Juline Patch, MD    Allergies  Allergen Reactions  . Sulfa Antibiotics Rash    Family History  Problem Relation Age of Onset  . Cancer Mother   . Heart disease Father   . Diabetes Maternal Grandmother   . Heart disease Paternal Grandfather   . Breast cancer Maternal Aunt        mat great aunt    Social  History Social History   Tobacco Use  . Smoking status: Never Smoker  . Smokeless tobacco: Never Used  Substance Use Topics  . Alcohol use: Yes    Alcohol/week: 0.0 standard drinks    Comment: very very rare  . Drug use: No    Review of Systems Constitutional: Positive for fever since yesterday per patient.  Positive for generalized fatigue and weakness. ENT: Negative for recent illness/congestion Cardiovascular: Negative for chest pain. Respiratory: Negative for shortness of breath.  Negative for cough. Gastrointestinal: Negative for abdominal pain, vomiting and diarrhea. Genitourinary: Negative for urinary compaints Skin: Negative for skin complaints  Neurological: Negative for headache All other ROS negative  ____________________________________________  PHYSICAL EXAM:  VITAL SIGNS: ED Triage Vitals  Enc Vitals Group     BP 11/25/18 1720 (!) 159/59     Pulse Rate 11/25/18 1720 98     Resp 11/25/18 1720 16     Temp 11/25/18 1720 (!) 100.7 F (38.2 C)     Temp Source 11/25/18 1720 Oral     SpO2 11/25/18 1720 97 %     Weight 11/25/18 1716 (!) 304 lb 15.8 oz (138.3 kg)     Height 11/25/18 1716 5\' 5"  (1.651 m)     Head Circumference --      Peak Flow --      Pain Score 11/25/18 1716 0     Pain Loc --      Pain Edu? --      Excl. in [place]? --    Constitutional: Alert and oriented. Well appearing and in no distress. Eyes: Normal exam ENT   Head: Normocephalic and atraumatic.   Mouth/Throat: Mucous membranes are moist. Cardiovascular: Normal rate, regular rhythm. No murmur Respiratory: Normal respiratory effort without tachypnea nor retractions. Breath sounds are clear Gastrointestinal: Soft and nontender. No distention. Musculoskeletal: Nontender with normal range of motion in all extremities.  Neurologic:  Normal speech and language. No gross focal neurologic deficits  Skin:  Skin is warm patient does have moderate erythema of the left foot with mild  edema Psychiatric: Mood and affect are normal.  ____________________________________________    EKG  EKG viewed and interpreted by myself shows a normal sinus rhythm at 96 bpm with a narrow QRS, normal axis, normal intervals, no concerning ST changes noted.  ____________________________________________    RADIOLOGY  Chest x-ray shows left basilar atelectasis otherwise negative.  ____________________________________________   INITIAL IMPRESSION / ASSESSMENT AND PLAN / ED COURSE  Pertinent labs & imaging results that were available during my care of the patient were reviewed by me and considered in my medical decision making (see chart for details).  Patient presents to the emergency department for generalized fatigue weakness found to be febrile to 101 per EMS 100.7 in the emergency department with a pulse of 98.  Given the patient's history we will initiate sepsis protocols start broad-spectrum antibiotics while awaiting results.  We will check labs including urinalysis, blood cultures, urine cultures, lactic acid level, total CK, influenza.  We will IV hydrate and cover with broad-spectrum antibiotics while awaiting results.  Patient's labs have resulted showing a CK 434, though somewhat elevated not concerning [place] so.  Influenza is negative.  Lab work is largely at the patient's baseline, mild lactic elevation.  Patient is chest x-ray shows left basilar atelectasis although this could be possible infiltrate or atelectasis appears more likely.  Patient does have left foot redness and swelling, patient's partner who is a PCP in the area states last time patient was admitted for cellulitis.  This could very likely be the cause of the patient's fever.  Given the patient's comorbidities I believe it is reasonable to admit the patient for IV antibiotics until the patient's cultures can grow out.  CRITICAL CARE Performed by: Harvest Dark   Total critical care time: 30  minutes  Critical care time was exclusive of separately billable procedures and treating other patients.  Critical care was necessary to treat or prevent imminent or life-threatening deterioration.  Critical care was time spent personally by me on the following activities: development of treatment plan with patient and/or surrogate as well as nursing, discussions with consultants, evaluation of patient's response  to treatment, examination of patient, obtaining history from patient or surrogate, ordering and performing treatments and interventions, ordering and review of laboratory studies, ordering and review of radiographic studies, pulse oximetry and re-evaluation of patient's condition.   ____________________________________________   FINAL CLINICAL IMPRESSION(S) / ED DIAGNOSES  Generalized fatigue/weakness Fever Sepsis   Harvest Dark, MD 11/25/18 2005

## 2018-11-25 NOTE — H&P (Signed)
[facility] [place] NAME: Maria Singh    MR#:  [id number]  DATE OF BIRTH:  25-May-1951  DATE OF ADMISSION:  11/25/2018  PRIMARY CARE PHYSICIAN: Glean Hess, MD   REQUESTING/REFERRING PHYSICIAN: Kerman Passey, MD  CHIEF COMPLAINT:   Chief Complaint  Patient presents with  . Fever    HISTORY OF PRESENT ILLNESS:  Maria Singh  is a 68 y.o. female who presents with chief complaint as above.  Patient presents to the hospital with report of fever and chills.  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  She states that in the past when she has started feeling this way she typically has cellulitis or infection in her foot.  On presentation here in the ED today her work-up is consistent with sepsis given her fever and neutrophilia.  She does have clinical cellulitis in her left foot.  Treatment was initiated in the ED and hospitalist were called for admission  PAST MEDICAL HISTORY:   Past Medical History:  Diagnosis Date  . 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 Depression   . Diabetes mellitus without complication ([place])   . GERD (gastroesophageal reflux disease)   . Hyperlipidemia   . Hypertension   . Rotator cuff injury Nov. 2012   left arm  . Sepsis ([place]) 07/16/2018  . Sleep apnea      PAST SURGICAL HISTORY:   Past Surgical History:  Procedure Laterality Date  . arm surgery Left    broken arm  . HERNIA REPAIR       SOCIAL HISTORY:   Social History   Tobacco Use  . Smoking status: Never Smoker  . Smokeless tobacco: Never Used  Substance Use Topics  . Alcohol use: Yes    Alcohol/week: 0.0 standard drinks    Comment: very very rare     FAMILY HISTORY:   Family History  Problem Relation Age of Onset  . Cancer Mother   . Heart disease Father   . Diabetes Maternal Grandmother   . Heart disease Paternal Grandfather   . Breast cancer Maternal Aunt        mat great aunt    DRUG  ALLERGIES:   Allergies  Allergen Reactions  . Metronidazole And Related Nausea Only    Mainly oral  . Sulfa Antibiotics Rash    MEDICATIONS AT HOME:   Prior to Admission medications   Medication Sig Start Date End Date Taking? Authorizing Provider  aspirin 81 MG tablet Take 81 mg by mouth daily.   Yes [provider]  docusate sodium (COLACE) 100 MG capsule Take 100 mg by mouth daily.    Yes [provider]  ferrous sulfate 325 (65 FE) MG tablet Take 1 tablet (325 mg total) by mouth daily with breakfast. 04/26/18  Yes Juline Patch, MD  HUMALOG KWIKPEN 100 UNIT/ML KwikPen INJECT 22 UNITS TOTAL INTO THE SKIN 3 THREE TIMES DAILY. 10/06/18  Yes Glean Hess, MD  LANTUS SOLOSTAR 100 UNIT/ML Solostar Pen INJECT 60 UNITS UNDER THE SKIN IN THE MORNING AND 50 UNITS UNDER THE SKIN IN THE EVENING Patient taking differently: 50-60 Units. Inject 50 units under the skin in the morning and 60 units under the skin in the evening 08/24/18  Yes Deane, Deanna C, MD  liraglutide (VICTOZA) 18 MG/3ML SOPN INJECT 1.8 MLS (10.8 MG TOTAL) INTO THE SKIN ONCE DAILY 04/26/18  Yes Juline Patch, MD  losartan (COZAAR) 25 MG  tablet TAKE 1 TABLET BY MOUTH DAILY. 08/24/18  Yes Juline Patch, MD  metFORMIN (GLUCOPHAGE) 1000 MG tablet Take 1 tablet (1,000 mg total) by mouth 2 (two) times daily. Patient taking differently: Take 500-1,000 mg by mouth 2 (two) times daily. Take 500 mg in the morning and 1,000 mg in the evening 08/10/17  Yes Otilio Miu C, MD  pantoprazole (PROTONIX) 40 MG tablet TAKE 1 TABLET BY MOUTH DAILY. 11/11/18  Yes Glean Hess, MD  rosuvastatin (CRESTOR) 5 MG tablet TAKE 1 TABLET BY MOUTH DAILY. 08/24/18  Yes Juline Patch, MD  sertraline (ZOLOFT) 100 MG tablet TAKE 1 TABLET BY MOUTH DAILY. 08/15/18  Yes Glean Hess, MD  ACCU-CHEK GUIDE test strip CHECK BLOOD SUGAR TWO TIMES DAILY 01/05/18   Juline Patch, MD  ACCU-CHEK GUIDE test strip CHECK BLOOD SUGAR TWO TIMES  DAILY 07/11/18   Juline Patch, MD  acetaminophen (TYLENOL) 500 MG tablet Take 500 mg by mouth every 6 (six) hours as needed.    [provider]  BAYER MICROLET LANCETS lancets USE AS DIRECTED THREE TIMES A DAY 08/10/16   Juline Patch, MD  clotrimazole-betamethasone (LOTRISONE) cream Apply 1 application topically 2 (two) times daily. 04/26/18   Juline Patch, MD  diclofenac sodium (VOLTAREN) 1 % GEL Apply to area BID PRN Patient not taking: Reported on 11/23/2018 12/27/15   Juline Patch, MD  doxycycline (VIBRA-TABS) 100 MG tablet Take 1 tablet (100 mg total) by mouth 2 (two) times daily for 10 days. 11/25/18 12/05/18  Glean Hess, MD  EPINEPHrine 0.3 mg/0.3 mL IJ SOAJ injection Inject 0.3 mLs (0.3 mg total) into the muscle once. Patient not taking: Reported on 11/23/2018 06/05/15   Juline Patch, MD  ibuprofen (ADVIL,MOTRIN) 800 MG tablet TAKE ONE TABLET BY MOUTH THREE TIMES A DAY Patient not taking: Reported on 11/25/2018 10/06/18   Glean Hess, MD  Insulin Pen Needle (NOVOFINE) 32G X 6 MM MISC USE AS DIRECTED 3 TIMES A DAY 08/10/17   Otilio Miu C, MD  Lidocaine 0.5 % GEL Apply 1 application topically 2 (two) times daily. Patient not taking: Reported on 11/23/2018 05/25/18   Juline Patch, MD  loratadine (CLARITIN) 10 MG tablet Take 10 mg by mouth daily.    [provider]  metFORMIN (GLUCOPHAGE) 500 MG tablet Take 1 tablet (500 mg total) by mouth every morning. Patient not taking: Reported on 11/25/2018 08/10/17   Juline Patch, MD  mupirocin ointment (BACTROBAN) 2 % APPLY TO AFFECTED AREA(S) AS NEEDED 11/03/18   Glean Hess, MD  NON FORMULARY Auto CPap nightly    [provider]  nystatin cream (MYCOSTATIN) APPLY 1 APPLICATION TOPICALLY 2 (TWO) TIMES DAILY. 07/01/18   Juline Patch, MD  ondansetron (ZOFRAN) 4 MG tablet TAKE 1 TABLET BY MOUTH EVERY 8 HOURS AS NEEDED FOR NAUSEA OR VOMITING. 10/10/18   Juline Patch, MD  ranitidine (ZANTAC) 150 MG  tablet Take 1 tablet (150 mg total) by mouth 2 (two) times daily. Patient not taking: Reported on 11/25/2018 05/03/18   Glean Hess, MD  triamcinolone cream (KENALOG) 0.5 % Apply 1 application topically 3 (three) times daily. Patient not taking: Reported on 11/23/2018 07/09/15   Juline Patch, MD    REVIEW OF SYSTEMS:  Review of Systems  Constitutional: Positive for chills and fever. Negative for malaise/fatigue and weight loss.  HENT: Negative for ear pain, hearing loss and tinnitus.   Eyes: Negative  for blurred vision, double vision, pain and redness.  Respiratory: Negative for cough, hemoptysis and shortness of breath.   Cardiovascular: Negative for chest pain, palpitations, orthopnea and leg swelling.  Gastrointestinal: Negative for abdominal pain, constipation, diarrhea, nausea and vomiting.  Genitourinary: Negative for dysuria, frequency and hematuria.  Musculoskeletal: Negative for back pain, joint pain and neck pain.  Skin:       Left foot erythema and warmth  Neurological: Negative for dizziness, tremors, focal weakness and weakness.  Endo/Heme/Allergies: Negative for polydipsia. Does not bruise/bleed easily.  Psychiatric/Behavioral: Negative for depression. The patient is not nervous/anxious and does not have insomnia.      VITAL SIGNS:   Vitals:   11/25/18 1716 11/25/18 1720 11/25/18 1900 11/25/18 1930  BP:  (!) 159/59 (!) 138/53 105/78  Pulse:  98 99 96  Resp:  16 (!) 23 (!) 24  Temp:  (!) 100.7 F (38.2 C)    TempSrc:  Oral    SpO2:  97% 99% 100%  Weight: (!) 138.3 kg     Height: 5\' 5"  (1.651 m)      Wt Readings from Last 3 Encounters:  11/25/18 (!) 138.3 kg  11/23/18 (!) 138.3 kg  07/16/18 (!) 138.3 kg    PHYSICAL EXAMINATION:  Physical Exam  Vitals reviewed. Constitutional: She is oriented to person, place, and time. She appears well-developed and well-nourished. No distress.  HENT:  Head: Normocephalic and atraumatic.  Mouth/Throat: Oropharynx is  clear and moist.  Eyes: Pupils are equal, round, and reactive to light. Conjunctivae and EOM are normal. No scleral icterus.  Neck: Normal range of motion. Neck supple. No JVD present. No thyromegaly present.  Cardiovascular: Normal rate, regular rhythm and intact distal pulses. Exam reveals no gallop and no friction rub.  No murmur heard. Respiratory: Effort normal and breath sounds normal. No respiratory distress. She has no wheezes. She has no rales.  GI: Soft. Bowel sounds are normal. She exhibits no distension. There is no abdominal tenderness.  Musculoskeletal: Normal range of motion.        General: No edema.     Comments: No arthritis, no gout  Lymphadenopathy:    She has no cervical adenopathy.  Neurological: She is alert and oriented to person, place, and time. No cranial nerve deficit.  No dysarthria, no aphasia  Skin: Skin is warm and dry. No rash noted. There is erythema (With warmth of her left foot).  Psychiatric: She has a normal mood and affect. Her behavior is normal. Judgment and thought content normal.    LABORATORY PANEL:   CBC Recent Labs  Lab 11/25/18 1741  WBC 10.3  HGB 9.7*  HCT 32.2*  PLT 214   ------------------------------------------------------------------------------------------------------------------  Chemistries  Recent Labs  Lab 11/25/18 1741  NA 135  K 6.1*  CL 106  CO2 21*  GLUCOSE 141*  BUN 28*  CREATININE 0.83  CALCIUM 8.8*  AST 26  ALT 29  ALKPHOS 31*  BILITOT 0.5   ------------------------------------------------------------------------------------------------------------------  Cardiac Enzymes No results for input(s): TROPONINI in the last 168 hours. ------------------------------------------------------------------------------------------------------------------  RADIOLOGY:  Dg Chest Port 1 View  Result Date: 11/25/2018 CLINICAL DATA:  Fever, history diabetes mellitus, hypertension, Charcot-Marie-Tooth muscular  dystrophy EXAM: PORTABLE CHEST 1 VIEW COMPARISON:  Portable exam 1725 hours compared to 07/16/2018 FINDINGS: Upper normal heart size. Mediastinal contours and pulmonary vascularity normal. LEFT basilar atelectasis. Lungs otherwise clear. No pulmonary infiltrate, pleural effusion or pneumothorax. IMPRESSION: LEFT basilar atelectasis. Electronically Signed   By: Crist Infante.D.  On: 11/25/2018 17:34    EKG:   Orders placed or performed during the hospital encounter of 11/25/18  . ED EKG 12-Lead  . ED EKG 12-Lead    IMPRESSION AND PLAN:  Principal Problem:   Sepsis ([place]) -IV antibiotics initiated, lactic acid was within normal limits, blood pressure stable, sepsis is due to cellulitis as below, culture sent from the ED Active Problems:   Cellulitis in diabetic foot ([facility]) -IV antibiotics as above   Essential hypertension -BP stable, continue home dose antihypertensives   Diabetes ([facility]) -sliding scale insulin coverage.  Patient is typically on higher doses of insulin at home, but her glucos is okay here in the ED today and she has had decreased p.o. intake given how she is feeling.  We will increase her insulin doses as needed   Major depression, chronic -continue home dose antidepressant   Hyperlipidemia associated with type 2 diabetes mellitus ([place]) -continue home dose antilipid   OSA on CPAP -CPAP nightly  Chart review performed and case discussed with ED provider. Labs, imaging and/or ECG reviewed by provider and discussed with patient/family. Management plans discussed with the patient and/or family.  DVT PROPHYLAXIS: SubQ lovenox   GI PROPHYLAXIS:  None  ADMISSION STATUS: Inpatient     CODE STATUS: Full Code Status History    Date Active Date Inactive Code Status Order ID Comments User Context   07/16/2018 1123 07/17/2018 1801 Full Code [id number]  Arta Silence, MD Inpatient      TOTAL TIME TAKING CARE OF THIS PATIENT: 45 minutes.   Ethlyn Daniels 11/25/2018, 8:51  PM  [facility]  Office  [phone]  CC: Primary care physician; Glean Hess, MD  Note:  This document was prepared using Dragon voice recognition software and may include unintentional dictation errors.

## 2018-11-25 NOTE — Progress Notes (Signed)
Pharmacy Antibiotic Note  Maria Singh is a 69 y.o. female admitted on 11/25/2018 with sepsis-unknown source  Pharmacy has been consulted for Cefepime and Vancomycin dosing.   Plan: Patient received Cefepime 2 gram IV x 1 in ED and Vancomycin 1 gram and 1500mg  for a total of 2500 mg as the loading dose. -Will continue with Cefepime 1 gram IV q12h -Will continue with Vancomycin 2000 mg IV q24h Goal AUC 400-550. Expected AUC: 534 SCr used: 0.83     Height: 5\' 5"  (165.1 cm) Weight: (!) 304 lb 15.8 oz (138.3 kg) IBW/kg (Calculated) : 57  Temp (24hrs), Avg:100.7 F (38.2 C), Min:100.7 F (38.2 C), Max:100.7 F (38.2 C)  Recent Labs  Lab 11/25/18 1741 11/25/18 1920  WBC 10.3  --   CREATININE 0.83  --   LATICACIDVEN  --  1.6    Estimated Creatinine Clearance: 94.2 mL/min (by C-G formula based on SCr of 0.83 mg/dL).    Allergies  Allergen Reactions  . Sulfa Antibiotics Rash    Antimicrobials this admission: Vanc 2/7 >>   Cefepime 2/7 >>    Dose adjustments this admission:    Microbiology results: 2/7 BCx: pending 2/7 UCx: pend    Sputum:      MRSA PCR:    Thank you for allowing pharmacy to be a part of this patient's care.  Jrue Yambao A 11/25/2018 8:27 PM

## 2018-11-25 NOTE — Progress Notes (Signed)
Anticoagulation monitoring(Lovenox):  68 yo female ordered Lovenox 40 mg Q24h  Filed Weights   11/25/18 1716  Weight: (!) 304 lb 15.8 oz (138.3 kg)   BMI 55  Lab Results  Component Value Date   CREATININE 0.83 11/25/2018   CREATININE 0.87 07/17/2018   CREATININE 0.86 07/16/2018   Estimated Creatinine Clearance: 94.2 mL/min (by C-G formula based on SCr of 0.83 mg/dL). Hemoglobin & Hematocrit     Component Value Date/Time   HGB 9.7 (L) 11/25/2018 1741   HGB 9.4 (L) 07/26/2018 1533   HCT 32.2 (L) 11/25/2018 1741   HCT 29.9 (L) 07/26/2018 1533     Per Protocol for Patient with estCrcl > 30 ml/min and BMI > 40, will transition to Lovenox 40 mg Q12h.

## 2018-11-25 NOTE — Progress Notes (Signed)
CODE SEPSIS - PHARMACY COMMUNICATION  **Broad Spectrum Antibiotics should be administered within 1 hour of Sepsis diagnosis**  Time Code Sepsis Called/Page Received: 1721  Antibiotics Ordered: Cefepime/Vanc  Time of 1st antibiotic administration: 1758  Additional action taken by pharmacy:    If necessary, Name of Provider/Nurse Contacted:      Noralee Space ,PharmD Clinical Pharmacist  11/25/2018  7:27 PM

## 2018-11-25 NOTE — ED Triage Notes (Signed)
Pt to ED via EMS from home. Pt c/o fever,on arrival temp 100.7. Pt has hx of charcot-marie tooth syndrome. Pt denies pain, n/v/d/ states she has productive cough, green sputum. VSS. NAD.

## 2018-11-26 LAB — URINE CULTURE: Culture: NO GROWTH

## 2018-11-26 LAB — BASIC METABOLIC PANEL
ANION GAP: 7 (ref 5–15)
BUN: 28 mg/dL — AB (ref 8–23)
CO2: 19 mmol/L — ABNORMAL LOW (ref 22–32)
Calcium: 8.2 mg/dL — ABNORMAL LOW (ref 8.9–10.3)
Chloride: 111 mmol/L (ref 98–111)
Creatinine, Ser: 0.99 mg/dL (ref 0.44–1.00)
GFR calc Af Amer: >60 mL/min (ref >60)
GFR calc non Af Amer: 59 mL/min — ABNORMAL LOW (ref >60)
Glucose, Bld: 111 mg/dL — ABNORMAL HIGH (ref 70–99)
POTASSIUM: 5.3 mmol/L — AB (ref 3.5–5.1)
Sodium: 137 mmol/L (ref 135–145)

## 2018-11-26 LAB — CBC
HCT: 28.1 % — ABNORMAL LOW (ref 36.0–46.0)
Hemoglobin: 8.5 g/dL — ABNORMAL LOW (ref 12.0–15.0)
MCH: 25 pg — ABNORMAL LOW (ref 26.0–34.0)
MCHC: 30.2 g/dL (ref 30.0–36.0)
MCV: 82.6 fL (ref 80.0–100.0)
Platelets: 188 10*3/uL (ref 150–400)
RBC: 3.4 MIL/uL — ABNORMAL LOW (ref 3.87–5.11)
RDW: 17.3 % — ABNORMAL HIGH (ref 11.5–15.5)
WBC: 7 10*3/uL (ref 4.0–10.5)
nRBC: 0 % (ref 0.0–0.2)

## 2018-11-26 LAB — C DIFFICILE QUICK SCREEN W PCR REFLEX
C Diff antigen: POSITIVE — AB
C Diff toxin: NEGATIVE

## 2018-11-26 LAB — GLUCOSE, CAPILLARY
GLUCOSE-CAPILLARY: 118 mg/dL — AB (ref 70–99)
Glucose-Capillary: 169 mg/dL — ABNORMAL HIGH (ref 70–99)
Glucose-Capillary: 108 mg/dL — ABNORMAL HIGH (ref 70–99)
Glucose-Capillary: 138 mg/dL — ABNORMAL HIGH (ref 70–99)

## 2018-11-26 LAB — CLOSTRIDIUM DIFFICILE BY PCR, REFLEXED: Toxigenic C. Difficile by PCR: POSITIVE — AB

## 2018-11-26 MED ORDER — VANCOMYCIN 50 MG/ML ORAL SOLUTION
125.0000 mg | Freq: Four times a day (QID) | ORAL | Status: DC
  Administered 2018-11-26 – 2018-11-29 (×12): 125 mg via ORAL
  Filled 2018-11-26 (×17): qty 2.5

## 2018-11-26 MED ORDER — IBUPROFEN 400 MG PO TABS
400.0000 mg | ORAL_TABLET | Freq: Four times a day (QID) | ORAL | Status: DC | PRN
  Administered 2018-11-26 – 2018-11-28 (×5): 400 mg via ORAL
  Filled 2018-11-26 (×5): qty 1

## 2018-11-26 MED ORDER — SODIUM CHLORIDE 0.9 % IV SOLN
INTRAVENOUS | Status: AC
  Administered 2018-11-26 (×2): via INTRAVENOUS

## 2018-11-26 NOTE — Progress Notes (Addendum)
[facility] [place] NAME: Milissa Fesperman    MR#:  [id number]  DATE OF BIRTH:  January 13, 1951  SUBJECTIVE:  CHIEF COMPLAINT:   Chief Complaint  Patient presents with  . Fever  Patient seen and evaluated today No complaints of abdominal pain Fever has improved No nausea and vomiting  REVIEW OF SYSTEMS:    ROS  CONSTITUTIONAL: No documented fever. Has fatigue, weakness. No weight gain, no weight loss.  EYES: No blurry or double vision.  ENT: No tinnitus. No postnasal drip. No redness of the oropharynx.  RESPIRATORY: No cough, no wheeze, no hemoptysis. No dyspnea.  CARDIOVASCULAR: No chest pain. No orthopnea. No palpitations. No syncope.  GASTROINTESTINAL: No nausea, no vomiting or diarrhea. No abdominal pain. No melena or hematochezia.  GENITOURINARY: No dysuria or hematuria.  ENDOCRINE: No polyuria or nocturia. No heat or cold intolerance.  HEMATOLOGY: No anemia. No bruising. No bleeding.  INTEGUMENTARY: No rashes. No lesions.  MUSCULOSKELETAL: No arthritis. No swelling. No gout.  NEUROLOGIC: No numbness, tingling, or ataxia. No seizure-type activity.  PSYCHIATRIC: No anxiety. No insomnia. No ADD.   DRUG ALLERGIES:   Allergies  Allergen Reactions  . Metronidazole And Related Nausea Only    Mainly oral  . Sulfa Antibiotics Rash    VITALS:  Blood pressure (!) 136/59, pulse 82, temperature 99.1 F (37.3 C), temperature source Oral, resp. rate 20, height 5\' 5"  (1.651 m), weight (!) 138.3 kg, SpO2 99 %.  PHYSICAL EXAMINATION:   Physical Exam  GENERAL:  68 y.o.-year-old patient lying in the bed with no acute distress.  EYES: Pupils equal, round, reactive to light and accommodation. No scleral icterus. Extraocular muscles intact.  HEENT: Head atraumatic, normocephalic. Oropharynx and nasopharynx clear.  NECK:  Supple, no jugular venous distention. No thyroid enlargement, no tenderness.  LUNGS: Normal breath sounds bilaterally, no  wheezing, rales, rhonchi. No use of accessory muscles of respiration.  CARDIOVASCULAR: S1, S2 normal. No murmurs, rubs, or gallops.  ABDOMEN: Soft, nontender, nondistended. Bowel sounds present. No organomegaly or mass.  EXTREMITIES: No cyanosis, clubbing or edema b/l.    Redness noted in the left foot NEUROLOGIC: Cranial nerves II through XII are intact. No focal Motor or sensory deficits b/l.   PSYCHIATRIC: The patient is alert and oriented x 3.  SKIN: No obvious rash, lesion, or ulcer.   LABORATORY PANEL:   CBC Recent Labs  Lab 11/26/18 0416  WBC 7.0  HGB 8.5*  HCT 28.1*  PLT 188   ------------------------------------------------------------------------------------------------------------------ Chemistries  Recent Labs  Lab 11/25/18 1741 11/26/18 0416  NA 135 137  K 6.1* 5.3*  CL 106 111  CO2 21* 19*  GLUCOSE 141* 111*  BUN 28* 28*  CREATININE 0.83 0.99  CALCIUM 8.8* 8.2*  AST 26  --   ALT 29  --   ALKPHOS 31*  --   BILITOT 0.5  --    ------------------------------------------------------------------------------------------------------------------  Cardiac Enzymes No results for input(s): TROPONINI in the last 168 hours. ------------------------------------------------------------------------------------------------------------------  RADIOLOGY:  Dg Chest Port 1 View  Result Date: 11/25/2018 CLINICAL DATA:  Fever, history diabetes mellitus, hypertension, Charcot-Marie-Tooth muscular dystrophy EXAM: PORTABLE CHEST 1 VIEW COMPARISON:  Portable exam 1725 hours compared to 07/16/2018 FINDINGS: Upper normal heart size. Mediastinal contours and pulmonary vascularity normal. LEFT basilar atelectasis. Lungs otherwise clear. No pulmonary infiltrate, pleural effusion or pneumothorax. IMPRESSION: LEFT basilar atelectasis. Electronically Signed   By: Lavonia Dana M.D.   On: 11/25/2018 17:34     ASSESSMENT  AND PLAN:  68 year old female patient with history of type 2 diabetes  mellitus, GERD, hyperlipidemia, hypertension, sleep apnea, charcot marie tooth dystrophy currently under hospitalist service for fever  -Acute C. difficile colitis Start patient on oral vancomycin Enteric precautions Monitor electrolytes  -Sepsis secondary to C. difficile colitis Continue antibiotics and follow-up cultures  -Left foot cellulitis Continue IV cefepime antibiotic Follow-up cultures  -Type 2 diabetes mellitus Diabetic diet with sliding scale coverage with insulin  -DVT prophylaxis with subcu Lovenox daily  -Hyperkalemia Improved   All the records are reviewed and case discussed with Care Management/Social Worker. Management plans discussed with the patient, family and they are in agreement.  CODE STATUS: Full code  DVT Prophylaxis: SCDs  TOTAL TIME TAKING CARE OF THIS PATIENT: 37 minutes.   POSSIBLE D/C IN 2 to 3 DAYS, DEPENDING ON CLINICAL CONDITION.  Saundra Shelling M.D on 11/26/2018 at 12:19 PM  Between 7am to 6pm - Pager - [phone]  After 6pm go to [email or web address] - password [email or web address] [facility]  [facility]  Office  [phone]  CC: Primary care physician; Glean Hess, MD  Note: This dictation was prepared with Dragon dictation along with smaller phrase technology. Any transcriptional errors that result from this process are unintentional.

## 2018-11-26 NOTE — Progress Notes (Signed)
Advanced care plan. Purpose of the Encounter: CODE STATUS Parties in Attendance: Patient Patient's Decision Capacity: Good Subjective/Patient's story:  Presented to the emergency room for fever and chills along with infection in the left foot Objective/Medical story Patient has C. difficile test positive Needs antibiotics showed So needs antibiotics for cellulitis of the foot Goals of care determination:  Advance care directives goals of care and treatment plan discussed Patient wants everything done which includes CPR, intubation ventilator if the need arises CODE STATUS: Full code Time spent discussing advanced care planning: 16 minutes

## 2018-11-27 LAB — GLUCOSE, CAPILLARY
Glucose-Capillary: 131 mg/dL — ABNORMAL HIGH (ref 70–99)
Glucose-Capillary: 192 mg/dL — ABNORMAL HIGH (ref 70–99)
Glucose-Capillary: 168 mg/dL — ABNORMAL HIGH (ref 70–99)
Glucose-Capillary: 171 mg/dL — ABNORMAL HIGH (ref 70–99)

## 2018-11-27 LAB — BASIC METABOLIC PANEL
Anion gap: 7 (ref 5–15)
BUN: 27 mg/dL — ABNORMAL HIGH (ref 8–23)
CALCIUM: 8.1 mg/dL — AB (ref 8.9–10.3)
CO2: 17 mmol/L — ABNORMAL LOW (ref 22–32)
Chloride: 114 mmol/L — ABNORMAL HIGH (ref 98–111)
Creatinine, Ser: 0.94 mg/dL (ref 0.44–1.00)
GFR calc Af Amer: >60 mL/min (ref >60)
GFR calc non Af Amer: >60 mL/min (ref >60)
GLUCOSE: 127 mg/dL — AB (ref 70–99)
Potassium: 5 mmol/L (ref 3.5–5.1)
Sodium: 138 mmol/L (ref 135–145)

## 2018-11-27 LAB — CBC
HCT: 27.3 % — ABNORMAL LOW (ref 36.0–46.0)
Hemoglobin: 8.2 g/dL — ABNORMAL LOW (ref 12.0–15.0)
MCH: 24.6 pg — ABNORMAL LOW (ref 26.0–34.0)
MCHC: 30 g/dL (ref 30.0–36.0)
MCV: 81.7 fL (ref 80.0–100.0)
Platelets: 168 10*3/uL (ref 150–400)
RBC: 3.34 MIL/uL — ABNORMAL LOW (ref 3.87–5.11)
RDW: 17.4 % — AB (ref 11.5–15.5)
WBC: 5.5 10*3/uL (ref 4.0–10.5)
nRBC: 0 % (ref 0.0–0.2)

## 2018-11-27 NOTE — Progress Notes (Addendum)
[facility] NAME: Soua Lenk    MR#:  [id number]  DATE OF BIRTH:  06/10/1951  SUBJECTIVE:  CHIEF COMPLAINT:   Chief Complaint  Patient presents with  . Fever  Patient seen and evaluated today No complaints of abdominal pain Had loose stool this morning Had low-grade fever last night No nausea and vomiting Appetite is okay  REVIEW OF SYSTEMS:    ROS  CONSTITUTIONAL: Had low-grade fever. Has fatigue, weakness. No weight gain, no weight loss.  EYES: No blurry or double vision.  ENT: No tinnitus. No postnasal drip. No redness of the oropharynx.  RESPIRATORY: No cough, no wheeze, no hemoptysis. No dyspnea.  CARDIOVASCULAR: No chest pain. No orthopnea. No palpitations. No syncope.  GASTROINTESTINAL: No nausea, no vomiting. No abdominal pain. No melena or hematochezia.  Had some diarrhea GENITOURINARY: No dysuria or hematuria.  ENDOCRINE: No polyuria or nocturia. No heat or cold intolerance.  HEMATOLOGY: No anemia. No bruising. No bleeding.  INTEGUMENTARY: No rashes. No lesions.  MUSCULOSKELETAL: No arthritis. No swelling. No gout.  NEUROLOGIC: No numbness, tingling, or ataxia. No seizure-type activity.  PSYCHIATRIC: No anxiety. No insomnia. No ADD.   DRUG ALLERGIES:   Allergies  Allergen Reactions  . Metronidazole And Related Nausea Only    Mainly oral  . Sulfa Antibiotics Rash    VITALS:  Blood pressure (!) 149/65, pulse 75, temperature 97.9 F (36.6 C), temperature source Oral, resp. rate 18, height 5\' 5"  (1.651 m), weight (!) 138.3 kg, SpO2 98 %.  PHYSICAL EXAMINATION:   Physical Exam  GENERAL:  68 y.o.-year-old patient lying in the bed with no acute distress.  EYES: Pupils equal, round, reactive to light and accommodation. No scleral icterus. Extraocular muscles intact.  HEENT: Head atraumatic, normocephalic. Oropharynx and nasopharynx clear.  NECK:  Supple, no jugular venous distention. No thyroid enlargement,  no tenderness.  LUNGS: Normal breath sounds bilaterally, no wheezing, rales, rhonchi. No use of accessory muscles of respiration.  CARDIOVASCULAR: S1, S2 normal. No murmurs, rubs, or gallops.  ABDOMEN: Soft, nontender, nondistended. Bowel sounds present. No organomegaly or mass.  EXTREMITIES: No cyanosis, clubbing or edema b/l.    Redness noted in the left foot NEUROLOGIC: Cranial nerves II through XII are intact. No focal Motor or sensory deficits b/l.   PSYCHIATRIC: The patient is alert and oriented x 3.  SKIN: No obvious rash, lesion, or ulcer.   LABORATORY PANEL:   CBC Recent Labs  Lab 11/27/18 0301  WBC 5.5  HGB 8.2*  HCT 27.3*  PLT 168   ------------------------------------------------------------------------------------------------------------------ Chemistries  Recent Labs  Lab 11/25/18 1741  11/27/18 0301  NA 135   < > 138  K 6.1*   < > 5.0  CL 106   < > 114*  CO2 21*   < > 17*  GLUCOSE 141*   < > 127*  BUN 28*   < > 27*  CREATININE 0.83   < > 0.94  CALCIUM 8.8*   < > 8.1*  AST 26  --   --   ALT 29  --   --   ALKPHOS 31*  --   --   BILITOT 0.5  --   --    < > = values in this interval not displayed.   ------------------------------------------------------------------------------------------------------------------  Cardiac Enzymes No results for input(s): TROPONINI in the last 168 hours. ------------------------------------------------------------------------------------------------------------------  RADIOLOGY:  Dg Chest Port 1 View  Result Date: 11/25/2018 CLINICAL DATA:  Fever, history  diabetes mellitus, hypertension, Charcot-Marie-Tooth muscular dystrophy EXAM: PORTABLE CHEST 1 VIEW COMPARISON:  Portable exam 1725 hours compared to 07/16/2018 FINDINGS: Upper normal heart size. Mediastinal contours and pulmonary vascularity normal. LEFT basilar atelectasis. Lungs otherwise clear. No pulmonary infiltrate, pleural effusion or pneumothorax. IMPRESSION: LEFT  basilar atelectasis. Electronically Signed   By: Lavonia Dana M.D.   On: 11/25/2018 17:34     ASSESSMENT AND PLAN:  68 year old female patient with history of type 2 diabetes mellitus, GERD, hyperlipidemia, hypertension, sleep apnea, charcot marie tooth dystrophy currently under hospitalist service for fever  -Acute C. difficile colitis improving slowly Continue oral vancomycin Enteric precautions Monitor electrolytes  -SIRS secondary to C. difficile colitis resolving Continue antibiotics and follow-up cultures  -Left foot cellulitis improved Discontinue IV cefepime antibiotic  -Type 2 diabetes mellitus Diabetic diet with sliding scale coverage with insulin  -DVT prophylaxis with subcu Lovenox daily  -Hyperkalemia Improved   All the records are reviewed and case discussed with Care Management/Social Worker. Management plans discussed with the patient, family and they are in agreement.  CODE STATUS: Full code  DVT Prophylaxis: SCDs  TOTAL TIME TAKING CARE OF THIS PATIENT: 35 minutes.   POSSIBLE D/C IN 2 to 3 DAYS, DEPENDING ON CLINICAL CONDITION.  Saundra Shelling M.D on 11/27/2018 at 11:10 AM  Between 7am to 6pm - Pager - [phone]  After 6pm go to [email or web address] - password [email or web address] [facility]  [facility] [place] [facility]  Office  [phone]  CC: Primary care physician; Glean Hess, MD  Note: This dictation was prepared with Dragon dictation along with smaller phrase technology. Any transcriptional errors that result from this process are unintentional.

## 2018-11-27 NOTE — Plan of Care (Signed)

## 2018-11-28 LAB — GLUCOSE, CAPILLARY
Glucose-Capillary: 135 mg/dL — ABNORMAL HIGH (ref 70–99)
Glucose-Capillary: 156 mg/dL — ABNORMAL HIGH (ref 70–99)
Glucose-Capillary: 166 mg/dL — ABNORMAL HIGH (ref 70–99)
Glucose-Capillary: 137 mg/dL — ABNORMAL HIGH (ref 70–99)

## 2018-11-28 LAB — BASIC METABOLIC PANEL
Anion gap: 6 (ref 5–15)
BUN: 25 mg/dL — ABNORMAL HIGH (ref 8–23)
CO2: 17 mmol/L — ABNORMAL LOW (ref 22–32)
Calcium: 8.1 mg/dL — ABNORMAL LOW (ref 8.9–10.3)
Chloride: 116 mmol/L — ABNORMAL HIGH (ref 98–111)
Creatinine, Ser: 0.89 mg/dL (ref 0.44–1.00)
GFR calc Af Amer: >60 mL/min (ref >60)
GFR calc non Af Amer: >60 mL/min (ref >60)
Glucose, Bld: 128 mg/dL — ABNORMAL HIGH (ref 70–99)
Potassium: 4.4 mmol/L (ref 3.5–5.1)
Sodium: 139 mmol/L (ref 135–145)

## 2018-11-28 MED ORDER — FERROUS SULFATE 325 (65 FE) MG PO TABS
325.0000 mg | ORAL_TABLET | Freq: Every day | ORAL | Status: DC
  Administered 2018-11-29: 325 mg via ORAL
  Filled 2018-11-28: qty 1

## 2018-11-28 NOTE — Plan of Care (Signed)

## 2018-11-28 NOTE — Progress Notes (Signed)
[facility] NAME: Maria Singh    MR#:  [id number]  DATE OF BIRTH:  25-Oct-1950  SUBJECTIVE:  CHIEF COMPLAINT:   Chief Complaint  Patient presents with  . Fever  Patient seen and evaluated today No complaints of abdominal pain Had semisolid stool this morning No fever No nausea and vomiting Appetite is okay Generalized weakness  REVIEW OF SYSTEMS:    ROS  CONSTITUTIONAL: Had low-grade fever. Has fatigue, weakness. No weight gain, no weight loss.  EYES: No blurry or double vision.  ENT: No tinnitus. No postnasal drip. No redness of the oropharynx.  RESPIRATORY: No cough, no wheeze, no hemoptysis. No dyspnea.  CARDIOVASCULAR: No chest pain. No orthopnea. No palpitations. No syncope.  GASTROINTESTINAL: No nausea, no vomiting. No abdominal pain. No melena or hematochezia.  Had some diarrhea GENITOURINARY: No dysuria or hematuria.  ENDOCRINE: No polyuria or nocturia. No heat or cold intolerance.  HEMATOLOGY: No anemia. No bruising. No bleeding.  INTEGUMENTARY: No rashes. No lesions.  MUSCULOSKELETAL: No arthritis. No swelling. No gout.  NEUROLOGIC: No numbness, tingling, or ataxia. No seizure-type activity.  PSYCHIATRIC: No anxiety. No insomnia. No ADD.   DRUG ALLERGIES:   Allergies  Allergen Reactions  . Metronidazole And Related Nausea Only    Mainly oral  . Sulfa Antibiotics Rash    VITALS:  Blood pressure (!) 131/57, pulse 65, temperature 98.2 F (36.8 C), temperature source Oral, resp. rate 16, height 5\' 5"  (1.651 m), weight (!) 138.3 kg, SpO2 97 %.  PHYSICAL EXAMINATION:   Physical Exam  GENERAL:  67 y.o.-year-old patient lying in the bed with no acute distress.  EYES: Pupils equal, round, reactive to light and accommodation. No scleral icterus. Extraocular muscles intact.  HEENT: Head atraumatic, normocephalic. Oropharynx and nasopharynx clear.  NECK:  Supple, no jugular venous distention. No thyroid  enlargement, no tenderness.  LUNGS: Normal breath sounds bilaterally, no wheezing, rales, rhonchi. No use of accessory muscles of respiration.  CARDIOVASCULAR: S1, S2 normal. No murmurs, rubs, or gallops.  ABDOMEN: Soft, nontender, nondistended. Bowel sounds present. No organomegaly or mass.  EXTREMITIES: No cyanosis, clubbing or edema b/l.    Redness noted in the left foot NEUROLOGIC: Cranial nerves II through XII are intact. No focal Motor or sensory deficits b/l.   PSYCHIATRIC: The patient is alert and oriented x 3.  SKIN: No obvious rash, lesion, or ulcer.   LABORATORY PANEL:   CBC Recent Labs  Lab 11/27/18 0301  WBC 5.5  HGB 8.2*  HCT 27.3*  PLT 168   ------------------------------------------------------------------------------------------------------------------ Chemistries  Recent Labs  Lab 11/25/18 1741  11/28/18 0314  NA 135   < > 139  K 6.1*   < > 4.4  CL 106   < > 116*  CO2 21*   < > 17*  GLUCOSE 141*   < > 128*  BUN 28*   < > 25*  CREATININE 0.83   < > 0.89  CALCIUM 8.8*   < > 8.1*  AST 26  --   --   ALT 29  --   --   ALKPHOS 31*  --   --   BILITOT 0.5  --   --    < > = values in this interval not displayed.   ------------------------------------------------------------------------------------------------------------------  Cardiac Enzymes No results for input(s): TROPONINI in the last 168 hours. ------------------------------------------------------------------------------------------------------------------  RADIOLOGY:  No results found.   ASSESSMENT AND PLAN:  68 year old female patient with history of  type 2 diabetes mellitus, GERD, hyperlipidemia, hypertension, sleep apnea, charcot marie tooth dystrophy currently under hospitalist service for fever  -Acute C. difficile colitis improving  Continue oral vancomycin Enteric precautions to continue Monitor electrolytes  -SIRS secondary to C. difficile colitis resolving Continue antibiotics and  follow-up cultures  -Left foot cellulitis improved Discontinue IV cefepime antibiotic  -Type 2 diabetes mellitus Diabetic diet with sliding scale coverage with insulin  -DVT prophylaxis with subcu Lovenox daily  -Hyperkalemia Improved  -Possible discharge in am to home   All the records are reviewed and case discussed with Care Management/Social Worker. Management plans discussed with the patient, family and they are in agreement.  CODE STATUS: Full code  DVT Prophylaxis: SCDs  TOTAL TIME TAKING CARE OF THIS PATIENT: 35 minutes.   POSSIBLE D/C IN 2 to 3 DAYS, DEPENDING ON CLINICAL CONDITION.  Saundra Shelling M.D on 11/28/2018 at 11:02 AM  Between 7am to 6pm - Pager - [phone]  After 6pm go to [email or web address] - password [email or web address] [facility]  [facility] [place] [facility]  Office  [phone]  CC: Primary care physician; Glean Hess, MD  Note: This dictation was prepared with Dragon dictation along with smaller phrase technology. Any transcriptional errors that result from this process are unintentional.

## 2018-11-28 NOTE — Progress Notes (Signed)
Pt remaining alert and oriented. Medicated for chronic back pain x1 with good results. Pt able to sleep through the night. Eating and drinking without difficulty.

## 2018-11-29 LAB — GLUCOSE, CAPILLARY
Glucose-Capillary: 135 mg/dL — ABNORMAL HIGH (ref 70–99)
Glucose-Capillary: 150 mg/dL — ABNORMAL HIGH (ref 70–99)

## 2018-11-29 MED ORDER — VANCOMYCIN 50 MG/ML ORAL SOLUTION: 125.0000 mg | Freq: Four times a day (QID) | ORAL | 0 refills | Status: DC

## 2018-11-29 MED ORDER — METFORMIN HCL 1000 MG PO TABS: 1000.0000 mg | ORAL_TABLET | Freq: Two times a day (BID) | ORAL | 0 refills | Status: DC

## 2018-11-29 MED ORDER — VANCOMYCIN 50 MG/ML ORAL SOLUTION: 125.0000 mg | Freq: Four times a day (QID) | ORAL | 0 refills | Status: AC

## 2018-11-29 NOTE — Progress Notes (Signed)
Per Dr. Estanislado Pandy prescription for Vancomycin and extra Metformin have been sent to [facility] as patient requested.

## 2018-11-29 NOTE — Discharge Summary (Signed)
[facility] NAME: Maria Singh    MR#:  [id number]  DATE OF BIRTH:  [birth year]  DATE OF ADMISSION:  11/25/2018 ADMITTING PHYSICIAN: Lance Coon, MD  DATE OF DISCHARGE: 11/29/2018  PRIMARY CARE PHYSICIAN: Glean Hess, MD   ADMISSION DIAGNOSIS:  Cellulitis of left lower extremity [L03.116] Sepsis, due to unspecified organism, unspecified whether acute organ dysfunction present ([place]) [A41.9]  DISCHARGE DIAGNOSIS:  Sepsis secondary to C. difficile colitis C. difficile colitis Left lower extremity cellulitis Type 2 diabetes mellitus GERD Acute hyperkalemia SECONDARY DIAGNOSIS:   Past Medical History:  Diagnosis Date  . 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 Depression   . Diabetes mellitus without complication ([place])   . GERD (gastroesophageal reflux disease)   . Hyperlipidemia   . Hypertension   . Rotator cuff injury Nov. 2012   left arm  . Sepsis ([place]) 07/16/2018  . Sleep apnea      ADMITTING HISTORY Maria Singh  is a 68 y.o. female who presents with chief complaint as above.  Patient presents to the hospital with report of fever and chills.  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  She states that in the past when she has started feeling this way she typically has cellulitis or infection in her foot.  On presentation here in the ED today her work-up is consistent with sepsis given her fever and neutrophilia.  She does have clinical cellulitis in her left foot.  Treatment was initiated in the ED and hospitalist were called for admission.  HOSPITAL COURSE:  Patient admitted to medical floor.  Patient received IV fluids and started on IV vancomycin and cefepime antibiotic.  C. difficile test came positive.  Patient was in enteric precautions.  Patient was switched to oral Vancocin antibiotic.  Cellulitis of the leg improved her cefepime antibiotic was discontinued.  Diarrhea improved abdominal pain  decreased.  Patient had more solid formed stools.  Her WBC count also improved.  Blood and urine cultures did not reveal any growth. Flu test was negative.  Patient clinically improved and will be discharged home on oral Vancocin antibiotic for total duration of 10 days.  CONSULTS OBTAINED:  Treatment Team:  Lucilla Lame, MD  DRUG ALLERGIES:   Allergies  Allergen Reactions  . Metronidazole And Related Nausea Only    Mainly oral  . Sulfa Antibiotics Rash    DISCHARGE MEDICATIONS:   Allergies as of 11/29/2018      Reactions   Metronidazole And Related Nausea Only   Mainly oral   Sulfa Antibiotics Rash      Medication List    STOP taking these medications   diclofenac sodium 1 % Gel Commonly known as:  VOLTAREN   doxycycline 100 MG tablet Commonly known as:  VIBRA-TABS   EPINEPHrine 0.3 mg/0.3 mL Soaj injection Commonly known as:  EPI-PEN   ibuprofen 800 MG tablet Commonly known as:  ADVIL,MOTRIN   Lidocaine 0.5 % Gel   ranitidine 150 MG tablet Commonly known as:  ZANTAC   triamcinolone cream 0.5 % Commonly known as:  KENALOG     TAKE these medications   ACCU-CHEK GUIDE test strip Generic drug:  glucose blood CHECK BLOOD SUGAR TWO TIMES DAILY   ACCU-CHEK GUIDE test strip Generic drug:  glucose blood CHECK BLOOD SUGAR TWO TIMES DAILY   acetaminophen 500 MG tablet Commonly known as:  TYLENOL Take 500 mg by mouth every 6 (six) hours as needed.  aspirin 81 MG tablet Take 81 mg by mouth daily.   BAYER MICROLET LANCETS lancets USE AS DIRECTED THREE TIMES A DAY   clotrimazole-betamethasone cream Commonly known as:  LOTRISONE Apply 1 application topically 2 (two) times daily.   docusate sodium 100 MG capsule Commonly known as:  COLACE Take 100 mg by mouth daily.   ferrous sulfate 325 (65 FE) MG tablet Take 1 tablet (325 mg total) by mouth daily with breakfast.   HUMALOG KWIKPEN 100 UNIT/ML KwikPen Generic drug:  insulin lispro INJECT 22 UNITS TOTAL  INTO THE SKIN 3 THREE TIMES DAILY.   Insulin Pen Needle 32G X 6 MM Misc Commonly known as:  NOVOFINE USE AS DIRECTED 3 TIMES A DAY   LANTUS SOLOSTAR 100 UNIT/ML Solostar Pen Generic drug:  Insulin Glargine INJECT 60 UNITS UNDER THE SKIN IN THE MORNING AND 50 UNITS UNDER THE SKIN IN THE EVENING What changed:  See the new instructions.   liraglutide 18 MG/3ML Sopn Commonly known as:  VICTOZA INJECT 1.8 MLS (10.8 MG TOTAL) INTO THE SKIN ONCE DAILY   loratadine 10 MG tablet Commonly known as:  CLARITIN Take 10 mg by mouth daily.   losartan 25 MG tablet Commonly known as:  COZAAR TAKE 1 TABLET BY MOUTH DAILY.   metFORMIN 1000 MG tablet Commonly known as:  GLUCOPHAGE Take 1 tablet (1,000 mg total) by mouth 2 (two) times daily. What changed:    how much to take  additional instructions  Another medication with the same name was removed. Continue taking this medication, and follow the directions you see here.   mupirocin ointment 2 % Commonly known as:  BACTROBAN APPLY TO AFFECTED AREA(S) AS NEEDED   NON FORMULARY Auto CPap nightly   nystatin cream Commonly known as:  MYCOSTATIN APPLY 1 APPLICATION TOPICALLY 2 (TWO) TIMES DAILY.   ondansetron 4 MG tablet Commonly known as:  ZOFRAN TAKE 1 TABLET BY MOUTH EVERY 8 HOURS AS NEEDED FOR NAUSEA OR VOMITING.   pantoprazole 40 MG tablet Commonly known as:  PROTONIX TAKE 1 TABLET BY MOUTH DAILY.   rosuvastatin 5 MG tablet Commonly known as:  CRESTOR TAKE 1 TABLET BY MOUTH DAILY.   sertraline 100 MG tablet Commonly known as:  ZOLOFT TAKE 1 TABLET BY MOUTH DAILY.   vancomycin 50 mg/mL  oral solution Commonly known as:  VANCOCIN Take 2.5 mLs (125 mg total) by mouth 4 (four) times daily for 7 days.       Today  Patient seen and evaluated today No chest pain No abdominal pain No fever Tolerating diet well Patient hemodynamically stable for discharge VITAL SIGNS:  Blood pressure (!) 130/55, pulse 64, temperature  98.3 F (36.8 C), temperature source Oral, resp. rate 16, height 5\' 5"  (1.651 m), weight (!) 138.3 kg, SpO2 99 %.  I/O:    Intake/Output Summary (Last 24 hours) at 11/29/2018 1120 Last data filed at 11/29/2018 1007 Gross per 24 hour  Intake 600 ml  Output 1050 ml  Net -450 ml    PHYSICAL EXAMINATION:  Physical Exam  GENERAL:  68 y.o.-year-old patient lying in the bed with no acute distress.  LUNGS: Normal breath sounds bilaterally, no wheezing, rales,rhonchi or crepitation. No use of accessory muscles of respiration.  CARDIOVASCULAR: S1, S2 normal. No murmurs, rubs, or gallops.  ABDOMEN: Soft, non-tender, non-distended. Bowel sounds present. No organomegaly or mass.  NEUROLOGIC: Moves all 4 extremities. PSYCHIATRIC: The patient is alert and oriented x 3.  SKIN: No obvious rash, lesion, or ulcer.  DATA REVIEW:   CBC Recent Labs  Lab 11/27/18 0301  WBC 5.5  HGB 8.2*  HCT 27.3*  PLT 168    Chemistries  Recent Labs  Lab 11/25/18 1741  11/28/18 0314  NA 135   < > 139  K 6.1*   < > 4.4  CL 106   < > 116*  CO2 21*   < > 17*  GLUCOSE 141*   < > 128*  BUN 28*   < > 25*  CREATININE 0.83   < > 0.89  CALCIUM 8.8*   < > 8.1*  AST 26  --   --   ALT 29  --   --   ALKPHOS 31*  --   --   BILITOT 0.5  --   --    < > = values in this interval not displayed.    Cardiac Enzymes No results for input(s): TROPONINI in the last 168 hours.  Microbiology Results  Results for orders placed or performed during the hospital encounter of 11/25/18  Blood Culture (routine x 2)     Status: None (Preliminary result)   Collection Time: 11/25/18  5:41 PM  Result Value Ref Range Status   Specimen Description BLOOD L AC  Final   Special Requests   Final    BOTTLES DRAWN AEROBIC AND ANAEROBIC Blood Culture results may not be optimal due to an excessive volume of blood received in culture bottles   Culture   Final    NO GROWTH 4 DAYS Performed at [facility], [address], [place], [place] [address]    Report Status PENDING  Incomplete  Blood Culture (routine x 2)     Status: None (Preliminary result)   Collection Time: 11/25/18  5:41 PM  Result Value Ref Range Status   Specimen Description BLOOD RAC  Final   Special Requests   Final    BOTTLES DRAWN AEROBIC AND ANAEROBIC Blood Culture results may not be optimal due to an excessive volume of blood received in culture bottles   Culture   Final    NO GROWTH 4 DAYS Performed at [facility], [address], [place], [place] [address]    Report Status PENDING  Incomplete  Urine culture     Status: None   Collection Time: 11/25/18  5:41 PM  Result Value Ref Range Status   Specimen Description URINE, RANDOM  Final   Special Requests   Final    NONE Performed at [facility], [address] [place], [place] [address]    Culture NO GROWTH  Final   Report Status 11/26/2018 FINAL  Final  C difficile quick scan w PCR reflex     Status: Abnormal   Collection Time: 11/26/18  7:15 AM  Result Value Ref Range Status   C Diff antigen POSITIVE (A) NEGATIVE Final   C Diff toxin NEGATIVE NEGATIVE Final   C Diff interpretation Results are indeterminate. See PCR results.  Final    Comment: Performed at [facility], [address] [place], [place] [address]  C. Diff by PCR, Reflexed     Status: Abnormal   Collection Time: 11/26/18  7:15 AM  Result Value Ref Range Status   Toxigenic C. Difficile by PCR POSITIVE (A) NEGATIVE Final    Comment: Positive for toxigenic C. difficile with little to no toxin production. Only treat if clinical presentation suggests symptomatic illness. RESULT CALLED TO, READ BACK BY AND VERIFIED WITH: DANA GREEN @  1035 ON 11/26/2018 BY FMW Performed at [facility], [address] [place],  [address]     RADIOLOGY:  No results found.  Follow up with PCP in 1 week.  Management plans discussed with the patient, family and they are in  agreement.  CODE STATUS: Full code    Code Status Orders  (From admission, onward)         Start     Ordered   11/25/18 2201  Full code  Continuous     11/25/18 2200        Code Status History    Date Active Date Inactive Code Status Order ID Comments User Context   07/16/2018 1123 07/17/2018 1801 Full Code [id number]  Arta Silence, MD Inpatient    Advance Directive Documentation     Most Recent Value  Type of Advance Directive  Healthcare Power of Attorney  Pre-existing out of facility DNR order (yellow form or pink MOST form)  -  "MOST" Form in Place?  -      TOTAL TIME TAKING CARE OF THIS PATIENT ON DAY OF DISCHARGE: more than 36 minutes.   Saundra Shelling M.D on 11/29/2018 at 11:20 AM  Between 7am to 6pm - Pager - [phone]  After 6pm go to [email or web address] - password [email or web address] [facility]  SOUND [place] [facility]  Office  [phone]  CC: Primary care physician; Glean Hess, MD  Note: This dictation was prepared with Dragon dictation along with smaller phrase technology. Any transcriptional errors that result from this process are unintentional.

## 2018-11-30 LAB — CULTURE, BLOOD (ROUTINE X 2)
Culture: NO GROWTH
Culture: NO GROWTH

## 2018-12-01 ENCOUNTER — Other Ambulatory Visit: Payer: Self-pay | Admitting: *Deleted

## 2018-12-01 NOTE — Patient Outreach (Addendum)
[facility]) Care Management  12/01/2018  Maria Singh 02-01-1951 [id number]   Transition of care call   Referral received: 11/28/18 Initial outreach: 12/01/18 Insurance: [place] Choice Plan   Subjective: Initial successful telephone call to patient's home number in order to complete transition of care assessment; 2 HIPAA identifiers verified. Explained purpose of call and completed transition of care assessment.  Mrs. Maria Singh she is doing well, no further diarrhea or abdominal pain. She says her fasting blood sugar this morning was 102 and she says she is an active participate in [place]- the [place] Type 2 Diabetes digital assistant platform. She says she checks her blood sugar daily, unusually a fasting in the morning but she says she occasionally varies the time and that her blood sugars, whether pre or post meal, always meet target.  She did have questions about completing her health action steps for the [facility] and how to reset her password for the [employer].     Objective:  Mrs. Maria Singh was hospitalized at [facility] from 2/7-2/08/2019 for sepsis secondary to C. Difficile. Comorbidities include:  Type 2 DM ( most recent Hgb A1C = 5.5% on 07/26/18 and she denies hypoglycemia), GERD, Charcot Marie Tooth disease (nonambulatory), HTN, OSA on CPAP, Hyperlipidemia, chronic depression She was discharged to home on 11/29/18 without the need for home health services or additional DME.   Assessment:  See transition of care flowsheet for assessment details.   Plan:  Discussed [place] Active Health Management new 2020 Wellness Requirements of: Complete the computerized Health Assessment and the Health Action Step with Active Health Management [facility]) by June 20 2019 AND have an annual physical between October 19, 2017 and April 19, 2019. Provided Mrs Maria Singh with the phone number to [place] so she  can reset her password. At Mrs Maria Singh' request,  secure e-mail sent to [facility] RN, CDE [place] that her Metformin dose was increased 1000 mg twice daily upon discharge from the hospital so that Maria Singh can update Mrs Maria Singh'  [place] care plan. No ongoing care management needs identified so will close case to [facility] care management services and route successful outreach letter with [facility] pamphlet and 24 Hour Nurse Line Magnet to [facility] clinical pool to be mailed to patient's home address.   Maria Ellison RN,CCM,CDE [place] Management Coordinator Office Phone [phone] Office Fax [phone]

## 2018-12-05 ENCOUNTER — Other Ambulatory Visit: Payer: Self-pay | Admitting: *Deleted

## 2018-12-05 NOTE — Patient Outreach (Signed)
[facility]) Care Management  12/05/2018  Maria Singh 12-29-1950 [id number]  Emmi General Discharge Red Flag follow up call:  Received Emmi red flag that Maria Singh answered negative to the question related to a scheduled follow up appointment with Maria primary care provider.  Subjective:  Reached Maria Singh at home number, 2 HIPAA identifiers verified, and explained purpose of call. Maria Singh states she spoke with Maria p[riamry care provider and was told that she can come to the office whenever it is convenient. Maria Singh says she will try to see Maria primary care provider either this Wednesday or Thursday.  Maria Singh, Maria Singh, is sick with the flu and Maria Singh ays will have to arrange for someone else to provide transportation to Maria providers office.  Maria Singh thanked this RNCM for  following up with Maria.   Barrington Ellison RN,CCM,CDE [place] Management Coordinator Office Phone [phone] Office Fax [phone]

## 2018-12-06 DIAGNOSIS — F4320 Adjustment disorder, unspecified: Secondary | ICD-10-CM | POA: Diagnosis not present

## 2018-12-13 ENCOUNTER — Other Ambulatory Visit: Payer: Self-pay

## 2018-12-13 ENCOUNTER — Encounter: Payer: Self-pay | Admitting: Internal Medicine

## 2018-12-13 ENCOUNTER — Ambulatory Visit (INDEPENDENT_AMBULATORY_CARE_PROVIDER_SITE_OTHER): Payer: 59 | Admitting: Internal Medicine

## 2018-12-13 VITALS — BP 138/68 | HR 77 | Ht 65.0 in

## 2018-12-13 DIAGNOSIS — D509 Iron deficiency anemia, unspecified: Secondary | ICD-10-CM | POA: Insufficient documentation

## 2018-12-13 DIAGNOSIS — I10 Essential (primary) hypertension: Secondary | ICD-10-CM

## 2018-12-13 DIAGNOSIS — E11620 Type 2 diabetes mellitus with diabetic dermatitis: Secondary | ICD-10-CM | POA: Diagnosis not present

## 2018-12-13 DIAGNOSIS — Z794 Long term (current) use of insulin: Secondary | ICD-10-CM

## 2018-12-13 DIAGNOSIS — G600 Hereditary motor and sensory neuropathy: Secondary | ICD-10-CM | POA: Diagnosis not present

## 2018-12-13 DIAGNOSIS — F324 Major depressive disorder, single episode, in partial remission: Secondary | ICD-10-CM | POA: Diagnosis not present

## 2018-12-13 DIAGNOSIS — D631 Anemia in chronic kidney disease: Secondary | ICD-10-CM | POA: Insufficient documentation

## 2018-12-13 DIAGNOSIS — E118 Type 2 diabetes mellitus with unspecified complications: Secondary | ICD-10-CM | POA: Insufficient documentation

## 2018-12-13 DIAGNOSIS — Z8619 Personal history of other infectious and parasitic diseases: Secondary | ICD-10-CM

## 2018-12-13 NOTE — Progress Notes (Signed)
Date:  12/13/2018   Name:  Maria Singh   DOB:  November 10, 1950   MRN:  [id number]   Chief Complaint: Hospitalization Follow-up and Diabetes (Foot Exam. ) Hospital follow up from [facility] 11/25/18 to 11/29/18. Discharge DX: Sepsis secondary to C. difficile colitis C. difficile colitis Left lower extremity cellulitis Type 2 diabetes mellitus GERD Acute hyperkalemia  Blood cultures were all negative. DM was well controlled. Zantac was stopped and Pantoprazole started but she stopped Pantoprazole and resumed zantac.Marland Kitchen She was discharged on a 7 day course of oral Vancomycin.  The diarrhea has resolved. She has also changed metformin to three times per day from 4 times per day. She remains anemic due to iron deficiency. We are working on trying to get her a colonoscopy with Dr. Allen Norris.  She is just not ready to try it yet.  She does not think she will be able to prep and get to the toilet without accidents.  She is thinking she may be able to use the [facility] lift if there is a sling with an opening for toiletting.  Her Hgb remains low despite iron supplements.  We discussed possible Hematology for iron infusion to help build up her counts.  Because she is not very active, she has not had any symptoms referable to anemia. She does not ambulate - only transfers to bed or toilet.  She spends most of her time in the motorized chair and it is wearing out.  She would like to get a new one. Her mood is good, she has a good outlook and is doing well on current medications.  No sleep issues noted.  Lab Results  Component Value Date   HGBA1C 5.5 07/26/2018   Lab Results  Component Value Date   WBC 5.5 11/27/2018   HGB 8.2 (L) 11/27/2018   HCT 27.3 (L) 11/27/2018   MCV 81.7 11/27/2018   PLT 168 11/27/2018    HPI  Review of Systems  Constitutional: Negative for chills, fatigue and fever.  HENT: Positive for hearing loss. Negative for trouble swallowing.   Eyes: Negative for visual disturbance.    Respiratory: Negative for chest tightness, shortness of breath and wheezing.   Cardiovascular: Positive for leg swelling. Negative for chest pain and palpitations.  Gastrointestinal: Negative for abdominal pain, blood in stool, constipation and diarrhea.  Genitourinary: Negative for dysuria and hematuria.  Musculoskeletal: Positive for arthralgias, gait problem and joint swelling.  Skin: Positive for color change. Negative for wound.  Neurological: Positive for weakness. Negative for dizziness, light-headedness and headaches.  Hematological: Negative for adenopathy.  Psychiatric/Behavioral: Negative for dysphoric mood and sleep disturbance.    Patient Active Problem List   Diagnosis Date Noted  . Type 2 diabetes mellitus with diabetic dermatitis, with long-term current use of insulin ([place]) 12/13/2018  . Iron deficiency anemia 12/13/2018  . Cellulitis in diabetic foot ([place]) 11/25/2018  . OSA on CPAP 11/25/2018  . GERD (gastroesophageal reflux disease) 04/13/2018  . Major depression single episode, in partial remission ([place]) 08/10/2017  . Hyperlipidemia associated with type 2 diabetes mellitus ([place]) 08/10/2017  . Essential hypertension 08/10/2017  . 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 Pain in limb 10/30/2016  . Swelling of limb 10/30/2016    Allergies  Allergen Reactions  . Metronidazole And Related Nausea Only    Mainly oral  . Sulfa Antibiotics Rash    Past Surgical History:  Procedure Laterality Date  . arm surgery Left    broken arm  . HERNIA REPAIR  Social History   Tobacco Use  . Smoking status: Never Smoker  . Smokeless tobacco: Never Used  Substance Use Topics  . Alcohol use: Yes    Alcohol/week: 0.0 standard drinks    Comment: very very rare  . Drug use: No     Medication list has been reviewed and updated.  Current Meds  Medication Sig  . ACCU-CHEK GUIDE test strip CHECK BLOOD SUGAR TWO TIMES DAILY  . ACCU-CHEK GUIDE test strip CHECK BLOOD SUGAR  TWO TIMES DAILY  . acetaminophen (TYLENOL) 500 MG tablet Take 500 mg by mouth every 6 (six) hours as needed.  Marland Kitchen aspirin 81 MG tablet Take 81 mg by mouth daily.  Marland Kitchen BAYER MICROLET LANCETS lancets USE AS DIRECTED THREE TIMES A DAY  . clotrimazole-betamethasone (LOTRISONE) cream Apply 1 application topically 2 (two) times daily.  . ferrous sulfate 325 (65 FE) MG tablet Take 1 tablet (325 mg total) by mouth daily with breakfast.  . HUMALOG KWIKPEN 100 UNIT/ML KwikPen INJECT 22 UNITS TOTAL INTO THE SKIN 3 THREE TIMES DAILY.  Marland Kitchen Insulin Pen Needle (NOVOFINE) 32G X 6 MM MISC USE AS DIRECTED 3 TIMES A DAY  . LANTUS SOLOSTAR 100 UNIT/ML Solostar Pen INJECT 60 UNITS UNDER THE SKIN IN THE MORNING AND 50 UNITS UNDER THE SKIN IN THE EVENING (Patient taking differently: 50-60 Units. Inject 50 units under the skin in the morning and 60 units under the skin in the evening)  . liraglutide (VICTOZA) 18 MG/3ML SOPN INJECT 1.8 MLS (10.8 MG TOTAL) INTO THE SKIN ONCE DAILY  . loratadine (CLARITIN) 10 MG tablet Take 10 mg by mouth daily.  Marland Kitchen losartan (COZAAR) 25 MG tablet TAKE 1 TABLET BY MOUTH DAILY.  . metFORMIN (GLUCOPHAGE) 1000 MG tablet Take 1 tablet (1,000 mg total) by mouth 2 (two) times daily for 30 days. (Patient taking differently: Take 1,000 mg by mouth 2 (two) times daily. 500 mg in the morning and 1000 in the evening.)  . mupirocin ointment (BACTROBAN) 2 % APPLY TO AFFECTED AREA(S) AS NEEDED  . NON FORMULARY Auto CPap nightly  . nystatin cream (MYCOSTATIN) APPLY 1 APPLICATION TOPICALLY 2 (TWO) TIMES DAILY.  Marland Kitchen ondansetron (ZOFRAN) 4 MG tablet TAKE 1 TABLET BY MOUTH EVERY 8 HOURS AS NEEDED FOR NAUSEA OR VOMITING.  . rosuvastatin (CRESTOR) 5 MG tablet TAKE 1 TABLET BY MOUTH DAILY.  Marland Kitchen sertraline (ZOLOFT) 100 MG tablet TAKE 1 TABLET BY MOUTH DAILY.    PHQ 2/9 Scores 12/13/2018 12/13/2018 07/26/2018 04/13/2018  PHQ - 2 Score 0 0 0 0  PHQ- 9 Score 0 - 0 0    Physical Exam Vitals signs and nursing note reviewed.    Constitutional:      General: She is not in acute distress.    Appearance: She is well-developed.  HENT:     Head: Normocephalic and atraumatic.     Mouth/Throat:     Mouth: Mucous membranes are moist.  Neck:     Musculoskeletal: Normal range of motion and neck supple.  Cardiovascular:     Rate and Rhythm: Normal rate and regular rhythm.  Pulmonary:     Effort: Pulmonary effort is normal. No respiratory distress.     Breath sounds: Normal breath sounds. No wheezing or rhonchi.  Musculoskeletal:        General: Swelling and deformity present.     Right lower leg: Edema present.     Left lower leg: Edema present.  Skin:    General: Skin is warm and dry.  Findings: No rash.  Neurological:     Mental Status: She is alert and oriented to person, place, and time.     Motor: Weakness and atrophy present. No tremor.     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        Thought Content: Thought content normal.     BP 138/68   Pulse 77   Ht 5\' 5"  (1.651 m)   SpO2 96%   BMI 50.75 kg/m   Assessment and Plan: 1. Type 2 diabetes mellitus with diabetic dermatitis, with long-term current use of insulin (HCC) Well controlled, recheck A1C Continue metformin 3 per day - Hemoglobin A1c  2. History of Clostridium difficile colitis Resolved clinically  3. Essential hypertension controlled  4. Major depression single episode, in partial remission ([place]) Doing well on Zoloft  5. Iron deficiency anemia, unspecified iron deficiency anemia type Really needs EGD and Colonoscopy but can not complete the prep at this time - pt will continue to consider and try to find a way to be able to prep adequately Consider Hematology for possible iron infusions if she becomes symptomatic from anemia - CBC with Differential/Platelet - Iron, TIBC and Ferritin Panel  6. 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 Any errors are unintentional.  Halina Maidens, MD [facility]  12/13/2018

## 2018-12-14 LAB — CBC WITH DIFFERENTIAL/PLATELET
Basophils Absolute: 0.1 10*3/uL (ref 0.0–0.2)
Basos: 1 %
EOS (ABSOLUTE): 0.3 10*3/uL (ref 0.0–0.4)
EOS: 5 %
Hematocrit: 29.8 % — ABNORMAL LOW (ref 34.0–46.6)
Hemoglobin: 9.3 g/dL — ABNORMAL LOW (ref 11.1–15.9)
Immature Grans (Abs): 0 10*3/uL (ref 0.0–0.1)
Immature Granulocytes: 0 %
Lymphocytes Absolute: 1.1 10*3/uL (ref 0.7–3.1)
Lymphs: 15 %
MCH: 25.1 pg — AB (ref 26.6–33.0)
MCHC: 31.2 g/dL — ABNORMAL LOW (ref 31.5–35.7)
MCV: 81 fL (ref 79–97)
Monocytes Absolute: 0.7 10*3/uL (ref 0.1–0.9)
Monocytes: 9 %
NEUTROS ABS: 4.9 10*3/uL (ref 1.4–7.0)
Neutrophils: 70 %
Platelets: 239 10*3/uL (ref 150–450)
RBC: 3.7 x10E6/uL — ABNORMAL LOW (ref 3.77–5.28)
RDW: 16.9 % — ABNORMAL HIGH (ref 11.7–15.4)
WBC: 7.1 10*3/uL (ref 3.4–10.8)

## 2018-12-14 LAB — IRON,TIBC AND FERRITIN PANEL
Ferritin: 45 ng/mL (ref 15–150)
IRON: 46 ug/dL (ref 27–139)
Iron Saturation: 12 % — ABNORMAL LOW (ref 15–55)
Total Iron Binding Capacity: 384 ug/dL (ref 250–450)
UIBC: 338 ug/dL (ref 118–369)

## 2018-12-14 LAB — HEMOGLOBIN A1C
Est. average glucose Bld gHb Est-mCnc: 126 mg/dL
Hgb A1c MFr Bld: 6 % — ABNORMAL HIGH (ref 4.8–5.6)

## 2018-12-20 DIAGNOSIS — F4320 Adjustment disorder, unspecified: Secondary | ICD-10-CM | POA: Diagnosis not present

## 2019-01-02 ENCOUNTER — Other Ambulatory Visit: Payer: Self-pay | Admitting: Family Medicine

## 2019-01-02 DIAGNOSIS — E109 Type 1 diabetes mellitus without complications: Secondary | ICD-10-CM

## 2019-01-03 DIAGNOSIS — F4320 Adjustment disorder, unspecified: Secondary | ICD-10-CM | POA: Diagnosis not present

## 2019-01-10 ENCOUNTER — Other Ambulatory Visit: Payer: Self-pay

## 2019-01-10 MED ORDER — RANITIDINE HCL 150 MG PO TABS: 150.0000 mg | ORAL_TABLET | Freq: Two times a day (BID) | ORAL | 0 refills | Status: DC

## 2019-01-10 NOTE — Progress Notes (Unsigned)
Getting prescriptions for mail order

## 2019-01-11 ENCOUNTER — Other Ambulatory Visit: Payer: Self-pay | Admitting: Family Medicine

## 2019-01-11 DIAGNOSIS — R110 Nausea: Secondary | ICD-10-CM

## 2019-01-11 MED FILL — HUMALOG 100 UNITS/ML KWIKPE: 100 | 90 days supply | Qty: 60 | Fill #0

## 2019-01-11 MED FILL — ONDANSETRON HCL 4 MG TABLET: 4 | 7 days supply | Qty: 20 | Fill #0

## 2019-01-17 DIAGNOSIS — F4320 Adjustment disorder, unspecified: Secondary | ICD-10-CM | POA: Diagnosis not present

## 2019-01-18 MED FILL — VICTOZA 18 MG/3 ML INJECT P: 18 | 90 days supply | Qty: 27 | Fill #0

## 2019-01-26 ENCOUNTER — Other Ambulatory Visit: Payer: Self-pay

## 2019-01-26 DIAGNOSIS — E1165 Type 2 diabetes mellitus with hyperglycemia: Secondary | ICD-10-CM

## 2019-01-26 DIAGNOSIS — E109 Type 1 diabetes mellitus without complications: Secondary | ICD-10-CM

## 2019-01-26 MED ORDER — METFORMIN HCL 1000 MG PO TABS: 1000.0000 mg | ORAL_TABLET | Freq: Two times a day (BID) | ORAL | 1 refills | Status: DC

## 2019-01-30 ENCOUNTER — Other Ambulatory Visit: Payer: Self-pay

## 2019-01-30 DIAGNOSIS — E1165 Type 2 diabetes mellitus with hyperglycemia: Secondary | ICD-10-CM

## 2019-01-30 DIAGNOSIS — E109 Type 1 diabetes mellitus without complications: Secondary | ICD-10-CM

## 2019-01-30 MED ORDER — METFORMIN HCL 1000 MG PO TABS: 1000.0000 mg | ORAL_TABLET | Freq: Two times a day (BID) | ORAL | 0 refills | Status: DC

## 2019-01-31 DIAGNOSIS — F4320 Adjustment disorder, unspecified: Secondary | ICD-10-CM | POA: Diagnosis not present

## 2019-02-15 ENCOUNTER — Other Ambulatory Visit: Payer: Self-pay | Admitting: Family Medicine

## 2019-02-28 DIAGNOSIS — F4320 Adjustment disorder, unspecified: Secondary | ICD-10-CM | POA: Diagnosis not present

## 2019-03-28 DIAGNOSIS — F4320 Adjustment disorder, unspecified: Secondary | ICD-10-CM | POA: Diagnosis not present

## 2019-04-11 DIAGNOSIS — F4320 Adjustment disorder, unspecified: Secondary | ICD-10-CM | POA: Diagnosis not present

## 2019-04-13 DIAGNOSIS — F4320 Adjustment disorder, unspecified: Secondary | ICD-10-CM | POA: Diagnosis not present

## 2019-04-18 ENCOUNTER — Other Ambulatory Visit: Payer: Self-pay | Admitting: Internal Medicine

## 2019-04-18 ENCOUNTER — Other Ambulatory Visit: Payer: Self-pay | Admitting: Family Medicine

## 2019-04-18 DIAGNOSIS — B372 Candidiasis of skin and nail: Secondary | ICD-10-CM

## 2019-04-18 MED ORDER — NYSTATIN 100000 UNIT/GM EX CREA: TOPICAL_CREAM | Freq: Two times a day (BID) | CUTANEOUS | 5 refills | Status: DC

## 2019-04-25 DIAGNOSIS — F4320 Adjustment disorder, unspecified: Secondary | ICD-10-CM | POA: Diagnosis not present

## 2019-05-03 ENCOUNTER — Other Ambulatory Visit: Payer: Self-pay

## 2019-05-03 DIAGNOSIS — E1165 Type 2 diabetes mellitus with hyperglycemia: Secondary | ICD-10-CM

## 2019-05-03 DIAGNOSIS — E109 Type 1 diabetes mellitus without complications: Secondary | ICD-10-CM

## 2019-05-03 MED ORDER — VICTOZA 18 MG/3ML ~~LOC~~ SOPN: PEN_INJECTOR | SUBCUTANEOUS | 5 refills | Status: DC

## 2019-05-09 DIAGNOSIS — F4320 Adjustment disorder, unspecified: Secondary | ICD-10-CM | POA: Diagnosis not present

## 2019-05-17 ENCOUNTER — Other Ambulatory Visit: Payer: Self-pay

## 2019-05-17 ENCOUNTER — Encounter: Payer: Self-pay | Admitting: Internal Medicine

## 2019-05-17 ENCOUNTER — Ambulatory Visit (INDEPENDENT_AMBULATORY_CARE_PROVIDER_SITE_OTHER): Payer: 59 | Admitting: Internal Medicine

## 2019-05-17 VITALS — BP 144/72 | HR 73 | Ht 65.0 in

## 2019-05-17 DIAGNOSIS — K219 Gastro-esophageal reflux disease without esophagitis: Secondary | ICD-10-CM

## 2019-05-17 DIAGNOSIS — I10 Essential (primary) hypertension: Secondary | ICD-10-CM | POA: Diagnosis not present

## 2019-05-17 DIAGNOSIS — R262 Difficulty in walking, not elsewhere classified: Secondary | ICD-10-CM | POA: Diagnosis not present

## 2019-05-17 DIAGNOSIS — E118 Type 2 diabetes mellitus with unspecified complications: Secondary | ICD-10-CM

## 2019-05-17 DIAGNOSIS — G600 Hereditary motor and sensory neuropathy: Secondary | ICD-10-CM | POA: Diagnosis not present

## 2019-05-17 DIAGNOSIS — Z0000 Encounter for general adult medical examination without abnormal findings: Secondary | ICD-10-CM | POA: Diagnosis not present

## 2019-05-17 DIAGNOSIS — G4733 Obstructive sleep apnea (adult) (pediatric): Secondary | ICD-10-CM

## 2019-05-17 DIAGNOSIS — Z1231 Encounter for screening mammogram for malignant neoplasm of breast: Secondary | ICD-10-CM

## 2019-05-17 DIAGNOSIS — Z9989 Dependence on other enabling machines and devices: Secondary | ICD-10-CM | POA: Diagnosis not present

## 2019-05-17 DIAGNOSIS — R21 Rash and other nonspecific skin eruption: Secondary | ICD-10-CM | POA: Diagnosis not present

## 2019-05-17 DIAGNOSIS — R490 Dysphonia: Secondary | ICD-10-CM

## 2019-05-17 MED ORDER — CLOTRIMAZOLE-BETAMETHASONE 1-0.05 % EX CREA: 1.0000 "application " | TOPICAL_CREAM | Freq: Two times a day (BID) | CUTANEOUS | 1 refills | Status: AC

## 2019-05-17 NOTE — Patient Instructions (Signed)
Take Famotidine daily to see if it helps the hoarseness

## 2019-05-17 NOTE — Progress Notes (Signed)
Date:  05/17/2019   Name:  Maria Singh   DOB:  [birth year]   MRN:  [id number]   Chief Complaint: Annual Exam Maria Singh is a 68 y.o. female who presents today for her Complete Annual Exam. She feels fairly well. She reports exercising none. She reports she is sleeping fairly well using CPAP nightly.   Mammogram 04/2018 Colonoscopy - not done Pneumonia vaccines complete  Diabetes She presents for her follow-up diabetic visit. She has type 2 diabetes mellitus. Her disease course has been stable. Pertinent negatives for hypoglycemia include no dizziness, headaches, nervousness/anxiousness or tremors. Associated symptoms include foot paresthesias. Pertinent negatives for diabetes include no chest pain, no fatigue, no polydipsia, no polyuria and no weight loss. Current diabetic treatment includes oral agent (monotherapy) and insulin injections (victozia). She is compliant with treatment all of the time. She monitors blood glucose at home 1-2 x per day. There is no change in her home blood glucose trend. Her breakfast blood glucose is taken between 6-7 am. Her breakfast blood glucose range is generally 90-110 mg/dl. An ACE inhibitor/angiotensin II receptor blocker is being taken.  Hypertension This is a chronic problem. The problem is controlled. Pertinent negatives include no chest pain, headaches, palpitations or shortness of breath. Past treatments include angiotensin blockers. The current treatment provides significant improvement.  Gastroesophageal Reflux She complains of heartburn. She reports no abdominal pain, no chest pain, no coughing or no wheezing. Pertinent negatives include no fatigue or weight loss. She has tried a PPI for the symptoms.  Hyperlipidemia This is a chronic problem. The problem is controlled. Pertinent negatives include no chest pain or shortness of breath. Current antihyperlipidemic treatment includes statins. The current treatment provides significant  improvement of lipids.  Hoarseness - she has noticed hoarseness that worsens through the day as she does more talking.  This has been a daily issue for the past 6 months.  She denies choking on food or fluids, no significant gerd, no nocturnal wakening. Ambulatory dysfunction - she uses a motorized chair but is able to transfer herself to the toilet.  She can do most of her care but needs assistance with bathing.  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 OSA - using CPAP nightly.  She feels well rested and has minimal daytime somnolence.  No morning headaches or worsening depression.  Review of Systems  Constitutional: Negative for chills, fatigue, fever and weight loss.  HENT: Negative for congestion, hearing loss, tinnitus, trouble swallowing and voice change.   Eyes: Negative for visual disturbance.  Respiratory: Negative for cough, chest tightness, shortness of breath and wheezing.   Cardiovascular: Negative for chest pain, palpitations and leg swelling.  Gastrointestinal: Positive for heartburn. Negative for abdominal pain, constipation, diarrhea and vomiting.  Endocrine: Negative for polydipsia and polyuria.  Genitourinary: Negative for dysuria, frequency, genital sores, vaginal bleeding and vaginal discharge.  Musculoskeletal: Negative for arthralgias, gait problem and joint swelling.  Skin: Negative for color change and rash.  Neurological: Negative for dizziness, tremors, light-headedness and headaches.  Hematological: Negative for adenopathy. Does not bruise/bleed easily.  Psychiatric/Behavioral: Negative for dysphoric mood and sleep disturbance. The patient is not nervous/anxious.     Patient Active Problem List   Diagnosis Date Noted  . Type II diabetes mellitus with complication ([place]) 00/76/2263  . Iron deficiency anemia 12/13/2018  . OSA on CPAP 11/25/2018  . GERD (gastroesophageal reflux disease) 04/13/2018  . Major  depression single episode,  in partial remission ([place]) 08/10/2017  . Hyperlipidemia associated with type 2 diabetes mellitus ([place]) 08/10/2017  . Essential hypertension 08/10/2017  . 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 Pain in limb 10/30/2016  . Swelling of limb 10/30/2016    Allergies  Allergen Reactions  . Metronidazole And Related Nausea Only    Mainly oral  . Sulfa Antibiotics Rash    Past Surgical History:  Procedure Laterality Date  . arm surgery Left    broken arm  . HERNIA REPAIR      Social History   Tobacco Use  . Smoking status: Never Smoker  . Smokeless tobacco: Never Used  Substance Use Topics  . Alcohol use: Yes    Alcohol/week: 0.0 standard drinks    Comment: very very rare  . Drug use: No     Medication list has been reviewed and updated.  Current Meds  Medication Sig  . ACCU-CHEK GUIDE test strip CHECK BLOOD SUGAR TWO TIMES DAILY  . ACCU-CHEK GUIDE test strip CHECK BLOOD SUGAR TWO TIMES DAILY  . acetaminophen (TYLENOL) 500 MG tablet Take 500 mg by mouth every 6 (six) hours as needed.  Marland Kitchen aspirin 81 MG tablet Take 81 mg by mouth daily.  Marland Kitchen BAYER MICROLET LANCETS lancets USE AS DIRECTED THREE TIMES A DAY  . clotrimazole-betamethasone (LOTRISONE) cream Apply 1 application topically 2 (two) times daily.  Marland Kitchen docusate sodium (COLACE) 100 MG capsule Take 100 mg by mouth daily.   . ferrous sulfate 325 (65 FE) MG tablet Take 1 tablet (325 mg total) by mouth daily with breakfast.  . fluconazole (DIFLUCAN) 150 MG tablet TAKE 1 TABLET BY MOUTH ONCE FOR 1 DOSE. TAKE 1 TODAY AND REPEAT IN 1 WEEK  . HUMALOG KWIKPEN 100 UNIT/ML KwikPen INJECT 22 UNITS TOTAL INTO THE SKIN 3 THREE TIMES DAILY.  Marland Kitchen Insulin Pen Needle (NOVOFINE) 32G X 6 MM MISC USE AS DIRECTED 3 TIMES A DAY  . LANTUS SOLOSTAR 100 UNIT/ML Solostar Pen INJECT 60 UNITS UNDER THE SKIN IN THE MORNING AND 50 UNITS UNDER THE SKIN IN THE EVENING (Patient taking differently: 50-60 Units. Inject 50 units  under the skin in the morning and 60 units under the skin in the evening)  . liraglutide (VICTOZA) 18 MG/3ML SOPN INJECT 1.8 MLS (10.8 MG TOTAL) INTO THE SKIN ONCE DAILY  . loratadine (CLARITIN) 10 MG tablet Take 10 mg by mouth daily.  Marland Kitchen losartan (COZAAR) 25 MG tablet TAKE 1 TABLET BY MOUTH DAILY.  . mupirocin ointment (BACTROBAN) 2 % APPLY TO AFFECTED AREA(S) AS NEEDED  . NON FORMULARY Auto CPap nightly  . nystatin cream (MYCOSTATIN) Apply topically 2 (two) times daily.  . ondansetron (ZOFRAN) 4 MG tablet TAKE 1 TABLET BY MOUTH EVERY 8 HOURS AS NEEDED FOR NAUSEA OR VOMITING.  . rosuvastatin (CRESTOR) 5 MG tablet TAKE 1 TABLET BY MOUTH DAILY.  Marland Kitchen sertraline (ZOLOFT) 100 MG tablet TAKE 1 TABLET BY MOUTH DAILY.    PHQ 2/9 Scores 05/17/2019 05/17/2019 12/13/2018 12/13/2018  PHQ - 2 Score 0 0 0 0  PHQ- 9 Score 1 - 0 -    BP Readings from Last 3 Encounters:  05/17/19 (!) 144/72  12/13/18 138/68  11/29/18 (!) 130/55    Physical Exam Vitals signs and nursing note reviewed.  Constitutional:      General: She is not in acute distress.    Appearance: She is well-developed.  HENT:     Head: Normocephalic and atraumatic.     Right Ear: Tympanic  membrane and ear canal normal. Decreased hearing noted.     Left Ear: Tympanic membrane and ear canal normal. Decreased hearing noted.     Nose:     Right Sinus: No maxillary sinus tenderness or frontal sinus tenderness.     Left Sinus: No maxillary sinus tenderness or frontal sinus tenderness.     Mouth/Throat:     Mouth: Mucous membranes are moist.     Pharynx: Uvula midline. No posterior oropharyngeal erythema or uvula swelling.  Eyes:     General: No scleral icterus.       Right eye: No discharge.        Left eye: No discharge.     Conjunctiva/sclera: Conjunctivae normal.  Neck:     Musculoskeletal: Normal range of motion. No erythema.     Thyroid: No thyromegaly.     Vascular: No carotid bruit.  Cardiovascular:     Rate and Rhythm: Normal  rate and regular rhythm.     Pulses: Normal pulses.     Heart sounds: Normal heart sounds.  Pulmonary:     Effort: Pulmonary effort is normal. No respiratory distress.     Breath sounds: Normal breath sounds. No wheezing.  Abdominal:     General: Abdomen is protuberant. Bowel sounds are normal.     Palpations: Abdomen is soft.     Tenderness: There is no abdominal tenderness.  Musculoskeletal: Normal range of motion.  Lymphadenopathy:     Cervical: No cervical adenopathy.  Skin:    General: Skin is warm and dry.     Capillary Refill: Capillary refill takes less than 2 seconds.     Findings: No rash.  Neurological:     Mental Status: She is alert and oriented to person, place, and time.     Cranial Nerves: No cranial nerve deficit.     Sensory: No sensory deficit.     Motor: Weakness and atrophy present.     Deep Tendon Reflexes: Reflexes are normal and symmetric.     Comments: Bilateral foot drop Skin intact  Psychiatric:        Attention and Perception: Attention and perception normal.        Mood and Affect: Mood normal.        Speech: Speech normal.        Behavior: Behavior normal.        Thought Content: Thought content normal.        Cognition and Memory: Cognition normal.     Wt Readings from Last 3 Encounters:  11/25/18 (!) 304 lb 15.8 oz (138.3 kg)  11/23/18 (!) 305 lb (138.3 kg)  07/16/18 (!) 305 lb (138.3 kg)    BP (!) 144/72   Pulse 73   Ht 5\' 5"  (1.651 m)   SpO2 95%   BMI 50.75 kg/m   Assessment and Plan: 1. Annual physical exam Continue activity as able Will not be able to prep for colonoscopy  2. Encounter for screening mammogram for breast cancer - MM 3D SCREEN BREAST BILATERAL; Future  3. Type II diabetes mellitus with complication (HCC) Appears stable on current regimen Eye exam up to date from 09/2018 - Lipid panel - Hemoglobin A1c - TSH + free T4  4. Essential hypertension controlled - CBC with Differential/Platelet - Comprehensive  metabolic panel  5. OSA on CPAP Doing well with restful sleep and excellent compliance  6. Gastroesophageal reflux disease without esophagitis Begin famotidine daily - CBC with Differential/Platelet  7. Charcot Marie Tooth muscular atrophy  Continue supportive care  8. Ambulatory dysfunction As above  9. Rash Continue topical antifungals - clotrimazole-betamethasone (LOTRISONE) cream; Apply 1 application topically 2 (two) times daily.  Dispense: 30 g; Refill: 1  10. Hoarseness of voice H2 blocker daily  - Ambulatory referral to ENT   Partially dictated using Dragon software. Any errors are unintentional.  Halina Maidens, MD [facility]  05/17/2019

## 2019-05-18 LAB — TSH+FREE T4
Free T4: 0.89 ng/dL (ref 0.82–1.77)
TSH: 5.99 u[IU]/mL — ABNORMAL HIGH (ref 0.450–4.500)

## 2019-05-18 LAB — CBC WITH DIFFERENTIAL/PLATELET
Basophils Absolute: 0 10*3/uL (ref 0.0–0.2)
Basos: 1 %
EOS (ABSOLUTE): 0.6 10*3/uL — ABNORMAL HIGH (ref 0.0–0.4)
Eos: 8 %
Hematocrit: 35 % (ref 34.0–46.6)
Hemoglobin: 11.3 g/dL (ref 11.1–15.9)
Immature Grans (Abs): 0.1 10*3/uL (ref 0.0–0.1)
Immature Granulocytes: 1 %
Lymphocytes Absolute: 1.4 10*3/uL (ref 0.7–3.1)
Lymphs: 21 %
MCH: 27.8 pg (ref 26.6–33.0)
MCHC: 32.3 g/dL (ref 31.5–35.7)
MCV: 86 fL (ref 79–97)
Monocytes Absolute: 0.5 10*3/uL (ref 0.1–0.9)
Monocytes: 8 %
Neutrophils Absolute: 4.1 10*3/uL (ref 1.4–7.0)
Neutrophils: 61 %
Platelets: 183 10*3/uL (ref 150–450)
RBC: 4.06 x10E6/uL (ref 3.77–5.28)
RDW: 15.1 % (ref 11.7–15.4)
WBC: 6.6 10*3/uL (ref 3.4–10.8)

## 2019-05-18 LAB — LIPID PANEL
Chol/HDL Ratio: 5.8 ratio — ABNORMAL HIGH (ref 0.0–4.4)
Cholesterol, Total: 152 mg/dL (ref 100–199)
HDL: 26 mg/dL — ABNORMAL LOW (ref >39)
LDL Calculated: 69 mg/dL (ref 0–99)
Triglycerides: 286 mg/dL — ABNORMAL HIGH (ref 0–149)
VLDL Cholesterol Cal: 57 mg/dL — ABNORMAL HIGH (ref 5–40)

## 2019-05-18 LAB — HEMOGLOBIN A1C
Est. average glucose Bld gHb Est-mCnc: 120 mg/dL
Hgb A1c MFr Bld: 5.8 % — ABNORMAL HIGH (ref 4.8–5.6)

## 2019-05-18 LAB — COMPREHENSIVE METABOLIC PANEL
ALT: 32 IU/L (ref 0–32)
AST: 28 IU/L (ref 0–40)
Albumin/Globulin Ratio: 1.5 (ref 1.2–2.2)
Albumin: 4.1 g/dL (ref 3.8–4.8)
Alkaline Phosphatase: 28 IU/L — ABNORMAL LOW (ref 39–117)
BUN/Creatinine Ratio: 29 — ABNORMAL HIGH (ref 12–28)
BUN: 22 mg/dL (ref 8–27)
Bilirubin Total: 0.2 mg/dL (ref 0.0–1.2)
CO2: 18 mmol/L — ABNORMAL LOW (ref 20–29)
Calcium: 9.6 mg/dL (ref 8.7–10.3)
Chloride: 106 mmol/L (ref 96–106)
Creatinine, Ser: 0.75 mg/dL (ref 0.57–1.00)
GFR calc Af Amer: 95 mL/min/{1.73_m2} (ref >59)
GFR calc non Af Amer: 83 mL/min/{1.73_m2} (ref >59)
Globulin, Total: 2.7 g/dL (ref 1.5–4.5)
Glucose: 108 mg/dL — ABNORMAL HIGH (ref 65–99)
Potassium: 5.4 mmol/L — ABNORMAL HIGH (ref 3.5–5.2)
Sodium: 141 mmol/L (ref 134–144)
Total Protein: 6.8 g/dL (ref 6.0–8.5)

## 2019-05-23 DIAGNOSIS — F4320 Adjustment disorder, unspecified: Secondary | ICD-10-CM | POA: Diagnosis not present

## 2019-06-06 DIAGNOSIS — F4320 Adjustment disorder, unspecified: Secondary | ICD-10-CM | POA: Diagnosis not present

## 2019-06-08 ENCOUNTER — Other Ambulatory Visit: Payer: Self-pay | Admitting: Internal Medicine

## 2019-06-08 DIAGNOSIS — N3000 Acute cystitis without hematuria: Secondary | ICD-10-CM

## 2019-06-08 MED ORDER — CIPROFLOXACIN HCL 250 MG PO TABS: 250.0000 mg | ORAL_TABLET | Freq: Two times a day (BID) | ORAL | 0 refills | Status: AC

## 2019-06-12 ENCOUNTER — Ambulatory Visit: Payer: Medicare Other | Admitting: Internal Medicine

## 2019-06-20 DIAGNOSIS — F4320 Adjustment disorder, unspecified: Secondary | ICD-10-CM | POA: Diagnosis not present

## 2019-06-30 ENCOUNTER — Other Ambulatory Visit: Payer: Self-pay

## 2019-06-30 MED ORDER — MUPIROCIN 2 % EX OINT: TOPICAL_OINTMENT | CUTANEOUS | 1 refills | Status: DC

## 2019-07-04 DIAGNOSIS — F4320 Adjustment disorder, unspecified: Secondary | ICD-10-CM | POA: Diagnosis not present

## 2019-08-01 ENCOUNTER — Other Ambulatory Visit: Payer: Self-pay | Admitting: Family Medicine

## 2019-08-01 DIAGNOSIS — F4320 Adjustment disorder, unspecified: Secondary | ICD-10-CM | POA: Diagnosis not present

## 2019-08-15 DIAGNOSIS — F4320 Adjustment disorder, unspecified: Secondary | ICD-10-CM | POA: Diagnosis not present

## 2019-08-29 DIAGNOSIS — F4320 Adjustment disorder, unspecified: Secondary | ICD-10-CM | POA: Diagnosis not present

## 2019-09-08 ENCOUNTER — Other Ambulatory Visit: Payer: Self-pay

## 2019-09-08 ENCOUNTER — Other Ambulatory Visit: Payer: Self-pay | Admitting: Internal Medicine

## 2019-09-08 DIAGNOSIS — E109 Type 1 diabetes mellitus without complications: Secondary | ICD-10-CM

## 2019-09-08 DIAGNOSIS — F329 Major depressive disorder, single episode, unspecified: Secondary | ICD-10-CM

## 2019-09-08 DIAGNOSIS — I10 Essential (primary) hypertension: Secondary | ICD-10-CM

## 2019-09-08 DIAGNOSIS — E782 Mixed hyperlipidemia: Secondary | ICD-10-CM

## 2019-09-08 DIAGNOSIS — E1165 Type 2 diabetes mellitus with hyperglycemia: Secondary | ICD-10-CM

## 2019-09-08 MED ORDER — ROSUVASTATIN CALCIUM 5 MG PO TABS: 5.0000 mg | ORAL_TABLET | Freq: Every day | ORAL | 3 refills | Status: DC

## 2019-09-08 MED ORDER — LOSARTAN POTASSIUM 25 MG PO TABS: 25.0000 mg | ORAL_TABLET | Freq: Every day | ORAL | 3 refills | Status: DC

## 2019-09-12 DIAGNOSIS — F4320 Adjustment disorder, unspecified: Secondary | ICD-10-CM | POA: Diagnosis not present

## 2019-09-26 DIAGNOSIS — F4320 Adjustment disorder, unspecified: Secondary | ICD-10-CM | POA: Diagnosis not present

## 2019-10-17 ENCOUNTER — Other Ambulatory Visit: Payer: Self-pay | Admitting: Internal Medicine

## 2019-10-17 DIAGNOSIS — R21 Rash and other nonspecific skin eruption: Secondary | ICD-10-CM

## 2019-10-17 MED ORDER — CEPHALEXIN 500 MG PO CAPS: 500.0000 mg | ORAL_CAPSULE | Freq: Four times a day (QID) | ORAL | 0 refills | Status: DC

## 2019-10-17 MED ORDER — MUPIROCIN 2 % EX OINT: TOPICAL_OINTMENT | CUTANEOUS | 2 refills | Status: DC

## 2019-10-17 MED ORDER — NYSTATIN 100000 UNIT/GM EX CREA: TOPICAL_CREAM | Freq: Two times a day (BID) | CUTANEOUS | 5 refills | Status: DC

## 2019-11-02 DIAGNOSIS — F4320 Adjustment disorder, unspecified: Secondary | ICD-10-CM | POA: Diagnosis not present

## 2019-11-07 DIAGNOSIS — E119 Type 2 diabetes mellitus without complications: Secondary | ICD-10-CM | POA: Diagnosis not present

## 2019-11-07 LAB — HM DIABETES EYE EXAM

## 2019-11-09 ENCOUNTER — Encounter: Payer: Self-pay | Admitting: Internal Medicine

## 2019-11-10 ENCOUNTER — Other Ambulatory Visit: Payer: Self-pay | Admitting: Internal Medicine

## 2019-11-10 DIAGNOSIS — E109 Type 1 diabetes mellitus without complications: Secondary | ICD-10-CM

## 2019-11-16 DIAGNOSIS — F4320 Adjustment disorder, unspecified: Secondary | ICD-10-CM | POA: Diagnosis not present

## 2019-11-29 ENCOUNTER — Other Ambulatory Visit: Payer: Self-pay | Admitting: Family Medicine

## 2019-11-29 DIAGNOSIS — E109 Type 1 diabetes mellitus without complications: Secondary | ICD-10-CM

## 2019-11-30 DIAGNOSIS — F4320 Adjustment disorder, unspecified: Secondary | ICD-10-CM | POA: Diagnosis not present

## 2019-12-06 ENCOUNTER — Other Ambulatory Visit: Payer: Self-pay

## 2019-12-06 ENCOUNTER — Emergency Department: Payer: 59

## 2019-12-06 ENCOUNTER — Other Ambulatory Visit: Payer: Self-pay | Admitting: Internal Medicine

## 2019-12-06 ENCOUNTER — Inpatient Hospital Stay
Admission: EM | Admit: 2019-12-06 | Discharge: 2019-12-08 | DRG: 641 | Disposition: A | Discharge status: Home Health | Payer: 59 | Attending: Hospitalist | Admitting: Hospitalist

## 2019-12-06 DIAGNOSIS — Z20822 Contact with and (suspected) exposure to covid-19: Secondary | ICD-10-CM | POA: Diagnosis present

## 2019-12-06 DIAGNOSIS — N2589 Other disorders resulting from impaired renal tubular function: Secondary | ICD-10-CM | POA: Diagnosis present

## 2019-12-06 DIAGNOSIS — T465X5A Adverse effect of other antihypertensive drugs, initial encounter: Secondary | ICD-10-CM | POA: Diagnosis present

## 2019-12-06 DIAGNOSIS — Z79899 Other long term (current) drug therapy: Secondary | ICD-10-CM

## 2019-12-06 DIAGNOSIS — M6282 Rhabdomyolysis: Secondary | ICD-10-CM | POA: Diagnosis not present

## 2019-12-06 DIAGNOSIS — R918 Other nonspecific abnormal finding of lung field: Secondary | ICD-10-CM | POA: Diagnosis not present

## 2019-12-06 DIAGNOSIS — E1140 Type 2 diabetes mellitus with diabetic neuropathy, unspecified: Secondary | ICD-10-CM | POA: Diagnosis present

## 2019-12-06 DIAGNOSIS — Z7982 Long term (current) use of aspirin: Secondary | ICD-10-CM

## 2019-12-06 DIAGNOSIS — I10 Essential (primary) hypertension: Secondary | ICD-10-CM | POA: Diagnosis present

## 2019-12-06 DIAGNOSIS — E785 Hyperlipidemia, unspecified: Secondary | ICD-10-CM | POA: Diagnosis present

## 2019-12-06 DIAGNOSIS — G600 Hereditary motor and sensory neuropathy: Secondary | ICD-10-CM | POA: Diagnosis present

## 2019-12-06 DIAGNOSIS — Z6841 Body Mass Index (BMI) 40.0 and over, adult: Secondary | ICD-10-CM

## 2019-12-06 DIAGNOSIS — Z833 Family history of diabetes mellitus: Secondary | ICD-10-CM | POA: Diagnosis not present

## 2019-12-06 DIAGNOSIS — Z888 Allergy status to other drugs, medicaments and biological substances status: Secondary | ICD-10-CM | POA: Diagnosis not present

## 2019-12-06 DIAGNOSIS — G4733 Obstructive sleep apnea (adult) (pediatric): Secondary | ICD-10-CM | POA: Diagnosis present

## 2019-12-06 DIAGNOSIS — R21 Rash and other nonspecific skin eruption: Secondary | ICD-10-CM

## 2019-12-06 DIAGNOSIS — R531 Weakness: Secondary | ICD-10-CM

## 2019-12-06 DIAGNOSIS — E1169 Type 2 diabetes mellitus with other specified complication: Secondary | ICD-10-CM | POA: Diagnosis present

## 2019-12-06 DIAGNOSIS — R7989 Other specified abnormal findings of blood chemistry: Secondary | ICD-10-CM | POA: Diagnosis not present

## 2019-12-06 DIAGNOSIS — E875 Hyperkalemia: Secondary | ICD-10-CM | POA: Diagnosis not present

## 2019-12-06 DIAGNOSIS — R262 Difficulty in walking, not elsewhere classified: Secondary | ICD-10-CM

## 2019-12-06 DIAGNOSIS — N3000 Acute cystitis without hematuria: Secondary | ICD-10-CM

## 2019-12-06 DIAGNOSIS — E118 Type 2 diabetes mellitus with unspecified complications: Secondary | ICD-10-CM | POA: Diagnosis present

## 2019-12-06 DIAGNOSIS — Z8249 Family history of ischemic heart disease and other diseases of the circulatory system: Secondary | ICD-10-CM

## 2019-12-06 DIAGNOSIS — Z882 Allergy status to sulfonamides status: Secondary | ICD-10-CM | POA: Diagnosis not present

## 2019-12-06 DIAGNOSIS — Z993 Dependence on wheelchair: Secondary | ICD-10-CM | POA: Diagnosis not present

## 2019-12-06 DIAGNOSIS — F329 Major depressive disorder, single episode, unspecified: Secondary | ICD-10-CM | POA: Diagnosis present

## 2019-12-06 DIAGNOSIS — Z794 Long term (current) use of insulin: Secondary | ICD-10-CM | POA: Diagnosis not present

## 2019-12-06 DIAGNOSIS — R748 Abnormal levels of other serum enzymes: Secondary | ICD-10-CM

## 2019-12-06 HISTORY — DX: Rhabdomyolysis: M62.82

## 2019-12-06 LAB — URINALYSIS, COMPLETE (UACMP) WITH MICROSCOPIC
Bacteria, UA: NONE SEEN
Bilirubin Urine: NEGATIVE
Glucose, UA: NEGATIVE mg/dL
Hgb urine dipstick: NEGATIVE
Ketones, ur: NEGATIVE mg/dL
Leukocytes,Ua: NEGATIVE
Nitrite: NEGATIVE
Protein, ur: NEGATIVE mg/dL
Specific Gravity, Urine: 1.006 (ref 1.005–1.030)
pH: 5 (ref 5.0–8.0)

## 2019-12-06 LAB — BASIC METABOLIC PANEL
Anion gap: 10 (ref 5–15)
BUN: 32 mg/dL — ABNORMAL HIGH (ref 8–23)
CO2: 15 mmol/L — ABNORMAL LOW (ref 22–32)
Calcium: 9.4 mg/dL (ref 8.9–10.3)
Chloride: 110 mmol/L (ref 98–111)
Creatinine, Ser: 0.83 mg/dL (ref 0.44–1.00)
GFR calc Af Amer: >60 mL/min (ref >60)
GFR calc non Af Amer: >60 mL/min (ref >60)
Glucose, Bld: 96 mg/dL (ref 70–99)
Potassium: 6.7 mmol/L (ref 3.5–5.1)
Sodium: 135 mmol/L (ref 135–145)

## 2019-12-06 LAB — CBC
HCT: 36.9 % (ref 36.0–46.0)
Hemoglobin: 11.7 g/dL — ABNORMAL LOW (ref 12.0–15.0)
MCH: 28.1 pg (ref 26.0–34.0)
MCHC: 31.7 g/dL (ref 30.0–36.0)
MCV: 88.7 fL (ref 80.0–100.0)
Platelets: 199 10*3/uL (ref 150–400)
RBC: 4.16 MIL/uL (ref 3.87–5.11)
RDW: 14.8 % (ref 11.5–15.5)
WBC: 8.9 10*3/uL (ref 4.0–10.5)
nRBC: 0 % (ref 0.0–0.2)

## 2019-12-06 LAB — CK: Total CK: 564 U/L — ABNORMAL HIGH (ref 38–234)

## 2019-12-06 LAB — LACTIC ACID, PLASMA: Lactic Acid, Venous: 0.8 mmol/L (ref 0.5–1.9)

## 2019-12-06 LAB — TROPONIN I (HIGH SENSITIVITY): Troponin I (High Sensitivity): 9 ng/L (ref <18)

## 2019-12-06 LAB — MAGNESIUM: Magnesium: 2.1 mg/dL (ref 1.7–2.4)

## 2019-12-06 MED ORDER — SODIUM CHLORIDE 0.9% FLUSH
3.0000 mL | Freq: Once | INTRAVENOUS | Status: AC
  Administered 2019-12-06: 3 mL via INTRAVENOUS

## 2019-12-06 MED ORDER — CEPHALEXIN 500 MG PO CAPS: 500.0000 mg | ORAL_CAPSULE | Freq: Four times a day (QID) | ORAL | 0 refills | Status: DC

## 2019-12-06 MED ORDER — SODIUM CHLORIDE 0.9 % IV BOLUS
500.0000 mL | Freq: Once | INTRAVENOUS | Status: AC
  Administered 2019-12-06: 500 mL via INTRAVENOUS

## 2019-12-06 NOTE — ED Triage Notes (Signed)
Pt comes via POV from home with c/o weakness that has been coming on but has gotten worse in the last few hours.  Pt states she is having trouble transferring and getting around. Pt states she has not been able to get up to go to toilet.  Pt denies any CP or SOB.

## 2019-12-06 NOTE — ED Notes (Signed)
Date and time results received: 12/06/19 1925 (use smartphrase ".now" to insert current time)  Test: Potassium Critical Value: 6.7  Name of Provider Notified: Dr. Jimmye Norman  Orders Received? Or Actions Taken?: No new orders at this time.

## 2019-12-06 NOTE — H&P (Signed)
History and Physical    Randall Vestal Delval [id number] DOB: 05-Jun-1951 DOA: 12/06/2019  PCP: Glean Hess, MD   Patient coming from: home  I have personally briefly reviewed patient's old medical records in [place]  Chief Complaint: weakness  HPI: Maria Singh is a 69 y.o. 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  States that at baseline she is unable to ambulate she has strength in her legs and cannot push off with her legs also has strength in her upper extremities using a trapeze. She denies chest pain, shortness of breath, fever chills, cough.  She denies one-sided weakness numbness or tingling in the extremities or face.  Denies myalgias.  ED Course: In the emergency room, she was afebrile with blood pressure 151/107, HR 71 O2 sat 100% on room air.  Remarkable on her blood work was potassium of 6.7, with normal mag of 2.1.  CO2 of 15, BUN/creatinine 32/0.83 CK was elevated at 564.  EKG showed no acute ST-T wave abnormalities no T wave peaks.  Urinalysis unremarkable.  Chest x-ray with no acute disease and mild cardiomegaly.  IV fluids administered.  Repeat potassium was 6.4.  Patient was given Veltassa.  Hospitalist consulted for admission. Review of Systems: As per HPI otherwise 10 point review of systems negative.    Past Medical History:  Diagnosis Date  . 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 Depression   . Diabetes mellitus without complication ([place])   . GERD (gastroesophageal reflux disease)   . Hyperlipidemia   . Hypertension   .  Rotator cuff injury Nov. 2012   left arm  . Sepsis ([place]) 07/16/2018  . Sepsis ([place]) 07/16/2018  . Sleep apnea     Past Surgical History:  Procedure Laterality Date  . arm surgery Left    broken arm  . HERNIA REPAIR       reports that she has never smoked. She has never used smokeless tobacco. She reports current alcohol use. She reports that she does not use drugs.  Allergies  Allergen Reactions  . Metronidazole And Related Nausea Only    Mainly oral  . Sulfa Antibiotics Rash    Family History  Problem Relation Age of Onset  . Cancer Mother   . Heart disease Father   . Diabetes Maternal Grandmother   . Heart disease Paternal Grandfather   . Breast cancer Maternal Aunt        mat great aunt     Prior to Admission medications   Medication Sig Start Date End Date Taking? Authorizing Provider  Accu-Chek FastClix Lancets MISC TEST ONCE DAILY 08/01/19   Juline Patch, MD  ACCU-CHEK GUIDE test strip CHECK BLOOD SUGAR TWO TIMES DAILY 01/05/18   Juline Patch, MD  ACCU-CHEK GUIDE test strip CHECK BLOOD SUGAR TWO TIMES DAILY 07/11/18   Juline Patch, MD  ACCU-CHEK GUIDE test strip CHECK BLOOD SUGAR TWO TIMES DAILY 08/01/19   Juline Patch, MD  acetaminophen (TYLENOL) 500 MG tablet Take 500 mg by mouth every 6 (six) hours as  needed.    [provider]  aspirin 81 MG tablet Take 81 mg by mouth daily.    [provider]  cephALEXin (KEFLEX) 500 MG capsule Take 1 capsule (500 mg total) by mouth 4 (four) times daily for 10 days. 12/06/19 12/16/19  Glean Hess, MD  clotrimazole-betamethasone (LOTRISONE) cream Apply 1 application topically 2 (two) times daily. 05/17/19   Glean Hess, MD  docusate sodium (COLACE) 100 MG capsule Take 100 mg by mouth daily.     [provider]  ferrous sulfate 325 (65 FE) MG tablet Take 1 tablet (325 mg total) by mouth daily with breakfast. 04/26/18   Juline Patch, MD  HUMALOG KWIKPEN 100 UNIT/ML KwikPen INJECT 22  UNITS TOTAL INTO THE SKIN 3 THREE TIMES DAILY. 11/10/19   Glean Hess, MD  Insulin Pen Needle (NOVOFINE) 32G X 6 MM MISC USE AS DIRECTED 3 TIMES A DAY 01/02/19   Glean Hess, MD  LANTUS SOLOSTAR 100 UNIT/ML Solostar Pen INJECT 60 UNITS UNDER THE SKIN IN THE MORNING AND 50 UNITS UNDER THE SKIN IN THE EVENING 11/29/19   Glean Hess, MD  liraglutide (VICTOZA) 18 MG/3ML SOPN INJECT 1.8 MLS (10.8 MG TOTAL) INTO THE SKIN ONCE DAILY 05/03/19   Glean Hess, MD  loratadine (CLARITIN) 10 MG tablet Take 10 mg by mouth daily.    [provider]  losartan (COZAAR) 25 MG tablet Take 1 tablet (25 mg total) by mouth daily. 09/08/19   Glean Hess, MD  metFORMIN (GLUCOPHAGE) 1000 MG tablet Take 1 tablet (1,000 mg total) by mouth 2 (two) times daily. 09/08/19 10/08/19  Glean Hess, MD  mupirocin ointment (BACTROBAN) 2 % APPLY TO AFFECTED AREA(S) AS NEEDED 10/17/19   Glean Hess, MD  NON FORMULARY Auto CPap nightly    [provider]  nystatin cream (MYCOSTATIN) Apply topically 2 (two) times daily. 10/17/19   Glean Hess, MD  rosuvastatin (CRESTOR) 5 MG tablet Take 1 tablet (5 mg total) by mouth daily. 09/08/19   Glean Hess, MD  sertraline (ZOLOFT) 100 MG tablet TAKE 1 TABLET BY MOUTH DAILY. 09/08/19   Glean Hess, MD    Physical Exam: Vitals:   12/06/19 1834 12/06/19 2100  BP: (!) 151/107 (!) 178/73  Pulse: 71 73  Resp: 18 (!) 23  Temp: 98.4 F (36.9 C)   SpO2: 100% 96%  Weight: (!) 138.3 kg   Height: 5\' 5"  (1.651 m)      Vitals:   12/06/19 1834 12/06/19 2100  BP: (!) 151/107 (!) 178/73  Pulse: 71 73  Resp: 18 (!) 23  Temp: 98.4 F (36.9 C)   SpO2: 100% 96%  Weight: (!) 138.3 kg   Height: 5\' 5"  (1.651 m)     Constitutional: NAD, alert and oriented x 3 Eyes: PERRL, lids and conjunctivae normal ENMT: Mucous membranes are moist.  Neck: normal, supple, no masses, no thyromegaly Respiratory: clear to auscultation  bilaterally, no wheezing, no crackles. Normal respiratory effort. No accessory muscle use.  Cardiovascular: Regular rate and rhythm, no murmurs / rubs / gallops. No extremity edema. 2+ pedal pulses. No carotid bruits.  Abdomen: no tenderness, no masses palpated. No hepatosplenomegaly. Bowel sounds positive.  Musculoskeletal: no clubbing / cyanosis.  Deformity bilateral feet  skin: no rashes, lesions, ulcers.  Neurologic: No gross focal neurologic deficit. Psychiatric: Normal mood and affect.   Labs on Admission: I have personally reviewed following labs and imaging studies  CBC:  Recent Labs  Lab 12/06/19 1837  WBC 8.9  HGB 11.7*  HCT 36.9  MCV 88.7  PLT 123XX123   Basic Metabolic Panel: Recent Labs  Lab 12/06/19 1837 12/06/19 2105  NA 135  --   K 6.7*  --   CL 110  --   CO2 15*  --   GLUCOSE 96  --   BUN 32*  --   CREATININE 0.83  --   CALCIUM 9.4  --   MG  --  2.1   GFR: Estimated Creatinine Clearance: 92.9 mL/min (by C-G formula based on SCr of 0.83 mg/dL). Liver Function Tests: No results for input(s): AST, ALT, ALKPHOS, BILITOT, PROT, ALBUMIN in the last 168 hours. No results for input(s): LIPASE, AMYLASE in the last 168 hours. No results for input(s): AMMONIA in the last 168 hours. Coagulation Profile: No results for input(s): INR, PROTIME in the last 168 hours. Cardiac Enzymes: Recent Labs  Lab 12/06/19 2105  CKTOTAL 564*   BNP (last 3 results) No results for input(s): PROBNP in the last 8760 hours. HbA1C: No results for input(s): HGBA1C in the last 72 hours. CBG: No results for input(s): GLUCAP in the last 168 hours. Lipid Profile: No results for input(s): CHOL, HDL, LDLCALC, TRIG, CHOLHDL, LDLDIRECT in the last 72 hours. Thyroid Function Tests: No results for input(s): TSH, T4TOTAL, FREET4, T3FREE, THYROIDAB in the last 72 hours. Anemia Panel: No results for input(s): VITAMINB12, FOLATE, FERRITIN, TIBC, IRON, RETICCTPCT in the last 72 hours. Urine  analysis:    Component Value Date/Time   COLORURINE STRAW (A) 12/06/2019 2105   APPEARANCEUR CLEAR (A) 12/06/2019 2105   APPEARANCEUR Clear 08/08/2013 0055   LABSPEC 1.006 12/06/2019 2105   LABSPEC 1.010 08/08/2013 0055   PHURINE 5.0 12/06/2019 2105   GLUCOSEU NEGATIVE 12/06/2019 2105   GLUCOSEU Negative 08/08/2013 0055   HGBUR NEGATIVE 12/06/2019 2105   BILIRUBINUR NEGATIVE 12/06/2019 2105   BILIRUBINUR neg 04/13/2018 1124   BILIRUBINUR Negative 08/08/2013 0055   KETONESUR NEGATIVE 12/06/2019 2105   PROTEINUR NEGATIVE 12/06/2019 2105   UROBILINOGEN 0.2 04/13/2018 1124   NITRITE NEGATIVE 12/06/2019 2105   LEUKOCYTESUR NEGATIVE 12/06/2019 2105   LEUKOCYTESUR Negative 08/08/2013 0055    Radiological Exams on Admission: DG Chest 1 View  Result Date: 12/06/2019 CLINICAL DATA:  Weakness EXAM: CHEST  1 VIEW COMPARISON:  11/25/2018, 07/16/2018 FINDINGS: Mild cardiomegaly without overt failure. Interstitial and patchy basilar opacity, suspect chronic change. No acute airspace disease, pleural effusion, or pneumothorax. IMPRESSION: No active disease.  Mild cardiomegaly Electronically Signed   By: Donavan Foil M.D.   On: 12/06/2019 21:17    EKG: Independently reviewed.   Assessment/Plan Principal Problem:  Hyperkalemia Generalized weakness Acute nontraumatic rhabdomyolysis -Etiology of hyperkalemia uncertain.  Renal function normal -Patient did state that her daughter is currently hospitalized with low potassium and they have been eating a high potassium diet -IV hydration -Urine sodium, calcium creatinine, serum and urine osmolality --Insulin and dextrose, daily Veltassa, -Monitor potassium, monitor CK -  Active Problems:   OSA on CPAP -Continue home CPAP    Type II diabetes mellitus with complication/neuropathy ([place]) -On high doses of basal and premeal insulin -Resistant sliding scale insulin and resume home basal insulin but at lower dose to avert inpatient  hypoglycemia    Morbid obesity with BMI of 50.0-59.9, adult (HCC)   Ambulatory dysfunction -This complicates overall prognosis and care -Increase nursing assistance to assist with transfers      DVT prophylaxis: lovenox  Code Status:  full code  Family Communication: none  Disposition Plan: Back to previous home environment Consults called: nephrology, Dr. Florene Glen MD [facility]     12/06/2019, 11:48 PM

## 2019-12-06 NOTE — ED Provider Notes (Signed)
[facility] Emergency Department Provider Note       Time seen: ----------------------------------------- 8:19 PM on 12/06/2019 -----------------------------------------   I have reviewed the triage vital signs and the nursing notes.  HISTORY   Chief Complaint Weakness    HPI Maria Singh is a 69 y.o. female with a history of depression, diabetes, hyperlipidemia, hypertension, sepsis who presents to the ED for weakness that has been gradually worsening but particularly has gotten worse over the last several hours.  Patient reports she has been in rhabdomyolysis in the past  Past Medical History:  Diagnosis Date  . 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 Depression   . Diabetes mellitus without complication ([place])   . GERD (gastroesophageal reflux disease)   . Hyperlipidemia   . Hypertension   . Rotator cuff injury Nov. 2012   left arm  . Sepsis ([place]) 07/16/2018  . Sepsis ([place]) 07/16/2018  . Sleep apnea     Patient Active Problem List   Diagnosis Date Noted  . Type II diabetes mellitus with complication ([place]) [id number]  . Iron deficiency anemia 12/13/2018  . OSA on CPAP 11/25/2018  . GERD (gastroesophageal reflux disease) 04/13/2018  . Major depression single episode, in partial remission ([place]) 08/10/2017  . Hyperlipidemia associated with type 2 diabetes mellitus ([place]) 08/10/2017  . Essential hypertension 08/10/2017  . 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 Pain in limb 10/30/2016  . Swelling of limb 10/30/2016    Past Surgical History:  Procedure Laterality Date  . arm surgery Left    broken arm  . HERNIA REPAIR      Allergies Metronidazole and related and Sulfa antibiotics  Social History Social History   Tobacco Use  . Smoking status: Never Smoker  . Smokeless tobacco: Never Used  Substance Use Topics  . Alcohol use: Yes    Alcohol/week: 0.0 standard drinks    Comment: very very rare  . Drug use: No     Review of Systems Constitutional: Negative for fever. Cardiovascular: Negative for chest pain. Respiratory: Negative for shortness of breath. Gastrointestinal: Negative for abdominal pain, vomiting and diarrhea. Musculoskeletal: Negative for back pain. Skin: Negative for rash. Neurological: Positive for profound diffuse weakness  All systems negative/normal/unremarkable except as stated in the HPI  ____________________________________________   PHYSICAL EXAM:  VITAL SIGNS: ED Triage Vitals [12/06/19 1834]  Enc Vitals Group     BP (!) 151/107     Pulse Rate 71     Resp 18     Temp 98.4 F (36.9 C)     Temp src      SpO2 100 %     Weight (!) 305 lb (138.3 kg)     Height 5\' 5"  (1.651 m)     Head Circumference      Peak Flow      Pain Score 0     Pain Loc      Pain Edu?      Excl. in [place]?     Constitutional: Alert and oriented.  Morbidly obese, no distress Eyes: Conjunctivae are normal. Normal extraocular movements. ENT      Head: Normocephalic and atraumatic.      Nose: No congestion/rhinnorhea.      Mouth/Throat: Mucous membranes are moist.      Neck: No stridor. Cardiovascular: Normal rate, regular rhythm. No murmurs, rubs, or gallops. Respiratory: Normal respiratory effort without tachypnea nor retractions. Breath sounds are clear and equal bilaterally. No wheezes/rales/rhonchi. Gastrointestinal: Soft and  nontender. Normal bowel sounds Musculoskeletal: Nontender with normal range of motion in extremities. No lower extremity tenderness nor edema. Neurologic:  Normal speech and language.  Extensive weakness, cannot stand or bear weight Skin:  Skin is warm, dry and intact. No rash noted. Psychiatric: Mood and affect are normal. Speech and behavior are normal.  ____________________________________________  EKG: Interpreted by me.  Sinus rhythm with rate of 67 bpm, normal PR interval, normal QRS, normal QT  ____________________________________________  ED  COURSE:  As part of my medical decision making, I reviewed the following data within the [place] History obtained from family if available, nursing notes, old chart and ekg, as well as notes from prior ED visits. Patient presented for profound weakness, we will assess with labs and imaging as indicated at this time.   Procedures  [name] was evaluated in Emergency Department on 12/06/2019 for the symptoms described in the history of present illness. She was evaluated in the context of the global COVID-19 pandemic, which necessitated consideration that the patient might be at risk for infection with the SARS-CoV-2 virus that causes COVID-19. Institutional protocols and algorithms that pertain to the evaluation of patients at risk for COVID-19 are in a state of rapid change based on information released by regulatory bodies including the CDC and federal and state organizations. These policies and algorithms were followed during the patient's care in the ED.  ____________________________________________   LABS (pertinent positives/negatives)  Labs Reviewed  BASIC METABOLIC PANEL - Abnormal; Notable for the following components:      Result Value   Potassium 6.7 (*)    CO2 15 (*)    BUN 32 (*)    All other components within normal limits  CBC - Abnormal; Notable for the following components:   Hemoglobin 11.7 (*)    All other components within normal limits  URINALYSIS, COMPLETE (UACMP) WITH MICROSCOPIC - Abnormal; Notable for the following components:   Color, Urine STRAW (*)    APPearance CLEAR (*)    All other components within normal limits  CK - Abnormal; Notable for the following components:   Total CK 564 (*)    All other components within normal limits  SARS CORONAVIRUS 2 (TAT 6-24 HRS)  CULTURE, BLOOD (ROUTINE X 2)  CULTURE, BLOOD (ROUTINE X 2)  MAGNESIUM  LACTIC ACID, PLASMA  BASIC METABOLIC PANEL  CBG MONITORING, ED  TROPONIN I (HIGH  SENSITIVITY)  TROPONIN I (HIGH SENSITIVITY)   ____________________________________________   DIFFERENTIAL DIAGNOSIS   Dehydration, electrolyte abnormality, occult infection, rhabdomyolysis  FINAL ASSESSMENT AND PLAN  Weakness, hyperkalemia, elevated CK   Plan: The patient had presented for profound weakness. Patient's labs did reveal an elevated CK for which she was given IV fluids.  She was also given this for her elevated potassium of uncertain etiology. Patient's imaging did not reveal any acute process.  I will discuss with the hospitalist for admission.   Laurence Aly, MD    Note: This note was generated in part or whole with voice recognition software. Voice recognition is usually quite accurate but there are transcription errors that can and very often do occur. I apologize for any typographical errors that were not detected and corrected.     Earleen Newport, MD 12/06/19 2219

## 2019-12-07 ENCOUNTER — Other Ambulatory Visit: Payer: Self-pay

## 2019-12-07 ENCOUNTER — Encounter: Payer: Self-pay | Admitting: Hospitalist

## 2019-12-07 DIAGNOSIS — Z833 Family history of diabetes mellitus: Secondary | ICD-10-CM | POA: Diagnosis not present

## 2019-12-07 DIAGNOSIS — I10 Essential (primary) hypertension: Secondary | ICD-10-CM | POA: Diagnosis present

## 2019-12-07 DIAGNOSIS — M6282 Rhabdomyolysis: Secondary | ICD-10-CM

## 2019-12-07 DIAGNOSIS — G4733 Obstructive sleep apnea (adult) (pediatric): Secondary | ICD-10-CM | POA: Diagnosis present

## 2019-12-07 DIAGNOSIS — Z20822 Contact with and (suspected) exposure to covid-19: Secondary | ICD-10-CM | POA: Diagnosis present

## 2019-12-07 DIAGNOSIS — F329 Major depressive disorder, single episode, unspecified: Secondary | ICD-10-CM | POA: Diagnosis present

## 2019-12-07 DIAGNOSIS — E1169 Type 2 diabetes mellitus with other specified complication: Secondary | ICD-10-CM | POA: Diagnosis present

## 2019-12-07 DIAGNOSIS — E1140 Type 2 diabetes mellitus with diabetic neuropathy, unspecified: Secondary | ICD-10-CM | POA: Diagnosis present

## 2019-12-07 DIAGNOSIS — Z794 Long term (current) use of insulin: Secondary | ICD-10-CM | POA: Diagnosis not present

## 2019-12-07 DIAGNOSIS — R531 Weakness: Secondary | ICD-10-CM | POA: Diagnosis not present

## 2019-12-07 DIAGNOSIS — Z882 Allergy status to sulfonamides status: Secondary | ICD-10-CM | POA: Diagnosis not present

## 2019-12-07 DIAGNOSIS — Z993 Dependence on wheelchair: Secondary | ICD-10-CM | POA: Diagnosis not present

## 2019-12-07 DIAGNOSIS — Z8249 Family history of ischemic heart disease and other diseases of the circulatory system: Secondary | ICD-10-CM | POA: Diagnosis not present

## 2019-12-07 DIAGNOSIS — Z79899 Other long term (current) drug therapy: Secondary | ICD-10-CM | POA: Diagnosis not present

## 2019-12-07 DIAGNOSIS — G600 Hereditary motor and sensory neuropathy: Secondary | ICD-10-CM | POA: Diagnosis present

## 2019-12-07 DIAGNOSIS — Z6841 Body Mass Index (BMI) 40.0 and over, adult: Secondary | ICD-10-CM | POA: Diagnosis not present

## 2019-12-07 DIAGNOSIS — T465X5A Adverse effect of other antihypertensive drugs, initial encounter: Secondary | ICD-10-CM | POA: Diagnosis present

## 2019-12-07 DIAGNOSIS — N2589 Other disorders resulting from impaired renal tubular function: Secondary | ICD-10-CM | POA: Diagnosis present

## 2019-12-07 DIAGNOSIS — R7989 Other specified abnormal findings of blood chemistry: Secondary | ICD-10-CM | POA: Diagnosis not present

## 2019-12-07 DIAGNOSIS — Z888 Allergy status to other drugs, medicaments and biological substances status: Secondary | ICD-10-CM | POA: Diagnosis not present

## 2019-12-07 DIAGNOSIS — E875 Hyperkalemia: Secondary | ICD-10-CM | POA: Diagnosis present

## 2019-12-07 DIAGNOSIS — E785 Hyperlipidemia, unspecified: Secondary | ICD-10-CM | POA: Diagnosis present

## 2019-12-07 DIAGNOSIS — Z7982 Long term (current) use of aspirin: Secondary | ICD-10-CM | POA: Diagnosis not present

## 2019-12-07 LAB — CBC
HCT: 33.9 % — ABNORMAL LOW (ref 36.0–46.0)
HCT: 35.2 % — ABNORMAL LOW (ref 36.0–46.0)
Hemoglobin: 10.8 g/dL — ABNORMAL LOW (ref 12.0–15.0)
Hemoglobin: 11.2 g/dL — ABNORMAL LOW (ref 12.0–15.0)
MCH: 28.3 pg (ref 26.0–34.0)
MCH: 28.4 pg (ref 26.0–34.0)
MCHC: 31.9 g/dL (ref 30.0–36.0)
MCHC: 31.8 g/dL (ref 30.0–36.0)
MCV: 88.7 fL (ref 80.0–100.0)
MCV: 89.3 fL (ref 80.0–100.0)
Platelets: 175 10*3/uL (ref 150–400)
Platelets: 168 10*3/uL (ref 150–400)
RBC: 3.82 MIL/uL — ABNORMAL LOW (ref 3.87–5.11)
RBC: 3.94 MIL/uL (ref 3.87–5.11)
RDW: 14.8 % (ref 11.5–15.5)
RDW: 14.7 % (ref 11.5–15.5)
WBC: 8.9 10*3/uL (ref 4.0–10.5)
WBC: 7.7 10*3/uL (ref 4.0–10.5)
nRBC: 0 % (ref 0.0–0.2)
nRBC: 0 % (ref 0.0–0.2)

## 2019-12-07 LAB — BASIC METABOLIC PANEL
Anion gap: 7 (ref 5–15)
Anion gap: 6 (ref 5–15)
Anion gap: 7 (ref 5–15)
BUN: 32 mg/dL — ABNORMAL HIGH (ref 8–23)
BUN: 30 mg/dL — ABNORMAL HIGH (ref 8–23)
BUN: 32 mg/dL — ABNORMAL HIGH (ref 8–23)
CO2: 19 mmol/L — ABNORMAL LOW (ref 22–32)
CO2: 20 mmol/L — ABNORMAL LOW (ref 22–32)
CO2: 18 mmol/L — ABNORMAL LOW (ref 22–32)
Calcium: 9.4 mg/dL (ref 8.9–10.3)
Calcium: 9.6 mg/dL (ref 8.9–10.3)
Calcium: 9 mg/dL (ref 8.9–10.3)
Chloride: 113 mmol/L — ABNORMAL HIGH (ref 98–111)
Chloride: 114 mmol/L — ABNORMAL HIGH (ref 98–111)
Chloride: 115 mmol/L — ABNORMAL HIGH (ref 98–111)
Creatinine, Ser: 0.91 mg/dL (ref 0.44–1.00)
Creatinine, Ser: 0.91 mg/dL (ref 0.44–1.00)
Creatinine, Ser: 0.77 mg/dL (ref 0.44–1.00)
GFR calc Af Amer: >60 mL/min (ref >60)
GFR calc Af Amer: >60 mL/min (ref >60)
GFR calc Af Amer: >60 mL/min (ref >60)
GFR calc non Af Amer: >60 mL/min (ref >60)
GFR calc non Af Amer: >60 mL/min (ref >60)
GFR calc non Af Amer: >60 mL/min (ref >60)
Glucose, Bld: 98 mg/dL (ref 70–99)
Glucose, Bld: 212 mg/dL — ABNORMAL HIGH (ref 70–99)
Glucose, Bld: 99 mg/dL (ref 70–99)
Potassium: 6.2 mmol/L — ABNORMAL HIGH (ref 3.5–5.1)
Potassium: 5.9 mmol/L — ABNORMAL HIGH (ref 3.5–5.1)
Potassium: 6.4 mmol/L (ref 3.5–5.1)
Sodium: 139 mmol/L (ref 135–145)
Sodium: 140 mmol/L (ref 135–145)
Sodium: 140 mmol/L (ref 135–145)

## 2019-12-07 LAB — TROPONIN I (HIGH SENSITIVITY): Troponin I (High Sensitivity): 7 ng/L (ref <18)

## 2019-12-07 LAB — HEMOGLOBIN A1C
Hgb A1c MFr Bld: 6.1 % — ABNORMAL HIGH (ref 4.8–5.6)
Mean Plasma Glucose: 128.37 mg/dL

## 2019-12-07 LAB — HIV ANTIBODY (ROUTINE TESTING W REFLEX): HIV Screen 4th Generation wRfx: NONREACTIVE

## 2019-12-07 LAB — NA AND K (SODIUM & POTASSIUM), RAND UR
Potassium Urine: 20 mmol/L
Sodium, Ur: 73 mmol/L

## 2019-12-07 LAB — CREATININE, URINE, RANDOM: Creatinine, Urine: 21 mg/dL

## 2019-12-07 LAB — CK: Total CK: 470 U/L — ABNORMAL HIGH (ref 38–234)

## 2019-12-07 LAB — GLUCOSE, CAPILLARY
Glucose-Capillary: 135 mg/dL — ABNORMAL HIGH (ref 70–99)
Glucose-Capillary: 140 mg/dL — ABNORMAL HIGH (ref 70–99)
Glucose-Capillary: 123 mg/dL — ABNORMAL HIGH (ref 70–99)

## 2019-12-07 LAB — SARS CORONAVIRUS 2 (TAT 6-24 HRS): SARS Coronavirus 2: NEGATIVE

## 2019-12-07 LAB — POTASSIUM
Potassium: 6.2 mmol/L — ABNORMAL HIGH (ref 3.5–5.1)
Potassium: 6 mmol/L — ABNORMAL HIGH (ref 3.5–5.1)
Potassium: 5.3 mmol/L — ABNORMAL HIGH (ref 3.5–5.1)

## 2019-12-07 LAB — OSMOLALITY, URINE: Osmolality, Ur: 283 mOsm/kg — ABNORMAL LOW (ref 300–900)

## 2019-12-07 MED ORDER — SENNOSIDES-DOCUSATE SODIUM 8.6-50 MG PO TABS: 1.0000 | ORAL_TABLET | Freq: Every evening | ORAL | Status: DC | PRN

## 2019-12-07 MED ORDER — INSULIN ASPART 100 UNIT/ML ~~LOC~~ SOLN: 0.0000 [IU] | Freq: Every day | SUBCUTANEOUS | Status: DC

## 2019-12-07 MED ORDER — DEXTROSE 50 % IV SOLN
1.0000 | Freq: Once | INTRAVENOUS | Status: AC
  Administered 2019-12-07: 50 mL via INTRAVENOUS
  Filled 2019-12-07: qty 50

## 2019-12-07 MED ORDER — INSULIN ASPART 100 UNIT/ML IV SOLN
10.0000 [IU] | Freq: Once | INTRAVENOUS | Status: AC
  Administered 2019-12-07: 10 [IU] via INTRAVENOUS
  Filled 2019-12-07: qty 0.1

## 2019-12-07 MED ORDER — ACETAMINOPHEN 650 MG RE SUPP: 650.0000 mg | Freq: Four times a day (QID) | RECTAL | Status: DC | PRN

## 2019-12-07 MED ORDER — SODIUM POLYSTYRENE SULFONATE 15 GM/60ML PO SUSP
15.0000 g | Freq: Once | ORAL | Status: AC
  Administered 2019-12-07: 15 g via ORAL
  Filled 2019-12-07: qty 60

## 2019-12-07 MED ORDER — PATIROMER SORBITEX CALCIUM 8.4 G PO PACK
16.8000 g | PACK | Freq: Every day | ORAL | Status: DC
  Administered 2019-12-08: 16.8 g via ORAL
  Filled 2019-12-07: qty 2

## 2019-12-07 MED ORDER — INSULIN GLARGINE 100 UNIT/ML ~~LOC~~ SOLN
30.0000 [IU] | Freq: Every day | SUBCUTANEOUS | Status: DC
  Administered 2019-12-07 – 2019-12-08 (×2): 30 [IU] via SUBCUTANEOUS
  Filled 2019-12-07 (×3): qty 0.3

## 2019-12-07 MED ORDER — ONDANSETRON HCL 4 MG/2ML IJ SOLN: 4.0000 mg | Freq: Four times a day (QID) | INTRAMUSCULAR | Status: DC | PRN

## 2019-12-07 MED ORDER — INSULIN ASPART 100 UNIT/ML ~~LOC~~ SOLN
0.0000 [IU] | Freq: Three times a day (TID) | SUBCUTANEOUS | Status: DC
  Administered 2019-12-07 (×2): 2 [IU] via SUBCUTANEOUS
  Filled 2019-12-07 (×2): qty 1

## 2019-12-07 MED ORDER — ACETAMINOPHEN 325 MG PO TABS: 650.0000 mg | ORAL_TABLET | Freq: Four times a day (QID) | ORAL | Status: DC | PRN

## 2019-12-07 MED ORDER — ONDANSETRON HCL 4 MG PO TABS: 4.0000 mg | ORAL_TABLET | Freq: Four times a day (QID) | ORAL | Status: DC | PRN

## 2019-12-07 MED ORDER — ENOXAPARIN SODIUM 40 MG/0.4ML ~~LOC~~ SOLN
40.0000 mg | Freq: Two times a day (BID) | SUBCUTANEOUS | Status: DC
  Administered 2019-12-07 – 2019-12-08 (×3): 40 mg via SUBCUTANEOUS
  Filled 2019-12-07 (×3): qty 0.4

## 2019-12-07 MED ORDER — SODIUM CHLORIDE 0.9 % IV SOLN: INTRAVENOUS | Status: DC

## 2019-12-07 MED ORDER — PATIROMER SORBITEX CALCIUM 8.4 G PO PACK: 8.4000 g | PACK | Freq: Every day | ORAL | Status: DC

## 2019-12-07 MED ORDER — PATIROMER SORBITEX CALCIUM 8.4 G PO PACK
8.4000 g | PACK | Freq: Every day | ORAL | Status: DC
  Administered 2019-12-07: 8.4 g via ORAL
  Filled 2019-12-07: qty 1

## 2019-12-07 NOTE — Progress Notes (Signed)
PROGRESS NOTE    Maria Singh  [id number] DOB: 1951-10-13 DOA: 12/06/2019 PCP: Glean Hess, MD    Assessment & Plan:   Principal Problem:   Rhabdomyolysis Active Problems:   OSA on CPAP   Type II diabetes mellitus with complication (HCC)   Hyperkalemia   Morbid obesity with BMI of 50.0-59.9, adult (HCC)   Ambulatory dysfunction    Maria Singh is a 69 y.o. 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   Hyperkalemia Generalized weakness Acute nontraumatic rhabdomyolysis -Etiology of hyperkalemia uncertain.  Renal function normal.  -Patient did state that her daughter is currently hospitalized with low potassium and they have been eating a high potassium diet -s/p IV hydration --s/p Insulin and dextrose, daily Veltassa, -s/p Kayexalate x1 with BM's but still K only down to 6 by late afternoon. PLAN: --repeat Kayexalate --Recheck K in the morning  Active Problems:   OSA on CPAP -Continue home CPAP    Type II diabetes mellitus with complication/neuropathy ([place]) -On high doses of basal and premeal insulin -Resistant sliding scale insulin and resume home basal insulin but at lower dose to avert inpatient hypoglycemia    Morbid obesity with BMI of 50.0-59.9, adult (HCC)   Ambulatory dysfunction -This complicates overall prognosis and care -Increase nursing assistance to assist with transfers --PT to eval and make sure pt was able to function at her prior level before discharge.   DVT prophylaxis: Lovenox SQ Code Status: Full code   Family Communication: not today Disposition Plan: tomorrow if K normalizes and after PT eval.   Subjective and Interval History:  Pt reported feeling better.  Had multiple BM's.  No fever, dyspnea, chest pain, abdominal pain, N/Singh, dysuria.   Objective: Vitals:   12/07/19 0930 12/07/19 1000 12/07/19 1500 12/07/19 1533  BP: (!) 154/69 (!) 164/64 (!) 169/74 (!) 150/85  Pulse:   74   Resp: 14 16 19 13   Temp:      SpO2:   94%   Weight:      Height:        Intake/Output Summary (Last 24 hours) at 12/07/2019 1820 Last data filed at 12/07/2019 0851 Gross per 24 hour  Intake --  Output 2450 ml  Net -2450 ml   Filed Weights   12/06/19 1834  Weight: (!) 138.3 kg    Examination:   Constitutional: NAD, AAOx3 HEENT: conjunctivae and lids normal, EOMI CV: RRR no M,R,G. Distal pulses +2.  No cyanosis.   RESP: CTA B/L, normal respiratory effort  GI: +BS, NTND Extremities: No effusions, edema, or tenderness in BLE SKIN: warm, dry and intact Neuro: II - XII grossly intact.  Sensation intact Psych: Normal mood and affect.  Appropriate judgement and reason   Data Reviewed: I have personally reviewed following labs and imaging studies  CBC: Recent Labs  Lab 12/06/19 1837 12/07/19 0145 12/07/19 0604  WBC 8.9 8.9 7.7  HGB 11.7* 10.8* 11.2*  HCT 36.9 33.9* 35.2*  MCV 88.7 88.7 89.3  PLT 199 175 [id number]   Basic Metabolic Panel: Recent Labs  Lab  12/06/19 1837 12/06/19 1837 12/06/19 2105 12/06/19 2344 12/07/19 0145 12/07/19 0604 12/07/19 0850 12/07/19 1319  NA 135  --   --  140 139 140  --   --   K 6.7*   < >  --  6.4* 6.2* 5.9* 6.2* 6.0*  CL 110  --   --  115* 113* 114*  --   --   CO2 15*  --   --  18* 19* 20*  --   --   GLUCOSE 96  --   --  99 98 212*  --   --   BUN 32*  --   --  32* 32* 30*  --   --   CREATININE 0.83  --   --  0.77 0.91 0.91  --   --   CALCIUM 9.4  --   --  9.0 9.4 9.6  --   --   MG  --   --  2.1  --   --   --   --   --    < > = values in this  interval not displayed.   GFR: Estimated Creatinine Clearance: 84.8 mL/min (by C-G formula based on SCr of 0.91 mg/dL). Liver Function Tests: No results for input(s): AST, ALT, ALKPHOS, BILITOT, PROT, ALBUMIN in the last 168 hours. No results for input(s): LIPASE, AMYLASE in the last 168 hours. No results for input(s): AMMONIA in the last 168 hours. Coagulation Profile: No results for input(s): INR, PROTIME in the last 168 hours. Cardiac Enzymes: Recent Labs  Lab 12/06/19 2105 12/07/19 0604  CKTOTAL 564* 470*   BNP (last 3 results) No results for input(s): PROBNP in the last 8760 hours. HbA1C: Recent Labs    12/07/19 0850  HGBA1C 6.1*   CBG: Recent Labs  Lab 12/07/19 1221 12/07/19 1647  GLUCAP 135* 140*   Lipid Profile: No results for input(s): CHOL, HDL, LDLCALC, TRIG, CHOLHDL, LDLDIRECT in the last 72 hours. Thyroid Function Tests: No results for input(s): TSH, T4TOTAL, FREET4, T3FREE, THYROIDAB in the last 72 hours. Anemia Panel: No results for input(s): VITAMINB12, FOLATE, FERRITIN, TIBC, IRON, RETICCTPCT in the last 72 hours. Sepsis Labs: Recent Labs  Lab 12/06/19 2105  LATICACIDVEN 0.8    Recent Results (from the past 240 hour(s))  SARS CORONAVIRUS 2 (TAT 6-24 HRS) Nasopharyngeal Nasopharyngeal Swab     Status: None   Collection Time: 12/06/19  9:05 PM   Specimen: Nasopharyngeal Swab  Result Value Ref Range Status   SARS Coronavirus 2 NEGATIVE NEGATIVE Final    Comment: (NOTE) SARS-CoV-2 target nucleic acids are NOT DETECTED. The SARS-CoV-2 RNA is generally detectable in upper and lower respiratory specimens during the acute phase of infection. Negative results do not preclude SARS-CoV-2 infection, do not rule out co-infections with other pathogens, and should not be used as the sole basis for treatment or other patient management decisions. Negative results must be combined with clinical observations, patient history, and epidemiological information.  The expected result is Negative. Fact Sheet for Patients: SugarRoll.be Fact Sheet for Healthcare Providers: https://www.woods-mathews.com/ This test is not yet approved or cleared by the [place] FDA and  has been authorized for detection and/or diagnosis of SARS-CoV-2 by FDA under an Emergency Use Authorization (EUA). This EUA will remain  in effect (meaning this test can be used) for the duration of the COVID-19 declaration under Section 56 4(b)(1) of the Act, 21 U.S.C. section 360bbb-3(b)(1), unless the authorization is terminated or revoked sooner. Performed at [facility]  [facility], [place] [address] [place], [place] [address]   Blood culture (routine x 2)     Status: None (Preliminary result)   Collection Time: 12/06/19  9:05 PM   Specimen: BLOOD RIGHT FOREARM  Result Value Ref Range Status   Specimen Description BLOOD RIGHT FOREARM  Final   Special Requests   Final    Blood Culture results may not be optimal due to an excessive volume of blood received in culture bottles   Culture   Final    NO GROWTH < 12 HOURS Performed at [facility], [address], [place], [place] [address]    Report Status PENDING  Incomplete  Blood culture (routine x 2)     Status: None (Preliminary result)   Collection Time: 12/06/19  9:06 PM   Specimen: Left Antecubital; Blood  Result Value Ref Range Status   Specimen Description LEFT ANTECUBITAL  Final   Special Requests   Final    Blood Culture results may not be optimal due to an excessive volume of blood received in culture bottles   Culture   Final    NO GROWTH < 12 HOURS Performed at [facility], [address] [place], [place] [address]    Report Status PENDING  Incomplete      Radiology Studies: DG Chest 1 View  Result Date: 12/06/2019 CLINICAL DATA:  Weakness EXAM: CHEST  1 VIEW COMPARISON:  11/25/2018, 07/16/2018 FINDINGS: Mild cardiomegaly without overt failure.  Interstitial and patchy basilar opacity, suspect chronic change. No acute airspace disease, pleural effusion, or pneumothorax. IMPRESSION: No active disease.  Mild cardiomegaly Electronically Signed   By: Donavan Foil M.D.   On: 12/06/2019 21:17     Scheduled Meds: . enoxaparin (LOVENOX) injection  40 mg Subcutaneous Q12H  . insulin aspart  0-15 Units Subcutaneous TID WC  . insulin aspart  0-5 Units Subcutaneous QHS  . insulin glargine  30 Units Subcutaneous Daily  . [START ON 12/08/2019] patiromer  16.8 g Oral Daily   Continuous Infusions:   LOS: 0 days     Enzo Bi, MD [facility] If 7PM-7AM, please contact night-coverage 12/07/2019, 6:20 PM

## 2019-12-07 NOTE — Progress Notes (Signed)
PHARMACIST - PHYSICIAN COMMUNICATION  CONCERNING:  Enoxaparin (Lovenox) for DVT Prophylaxis    RECOMMENDATION: Patient was prescribed enoxaprin 40mg  q24 hours for VTE prophylaxis.   Filed Weights   12/06/19 1834  Weight: (!) 305 lb (138.3 kg)    Body mass index is 50.75 kg/m.  Estimated Creatinine Clearance: 84.8 mL/min (by C-G formula based on SCr of 0.91 mg/dL).  Based on [place] patient is candidate for enoxaparin 40mg  every 12 hour dosing due to BMI being >40.  DESCRIPTION: Pharmacy has adjusted enoxaparin dose per [facility] policy.  Patient is now receiving enoxaparin 40mg  every 12 hours.   Ena Dawley, PharmD Clinical Pharmacist  12/07/2019 7:00 AM

## 2019-12-07 NOTE — Progress Notes (Signed)
Report called to [place], [place]

## 2019-12-07 NOTE — ED Notes (Signed)
Pt taken off bedpan, repositioned in bed at this time. Call bell remains within reach. Pt denies further needs.

## 2019-12-07 NOTE — ED Notes (Signed)
Pt given breakfast tray at this time. 

## 2019-12-07 NOTE — ED Notes (Signed)
Pt cleansed after having BM on bedpan. Pt repositioned in bed at this time.

## 2019-12-07 NOTE — ED Notes (Signed)
This RN and Claiborne Billings, RN at bedside, CBG obtained, pt placed on bedpan to have a bowel movement at this time.

## 2019-12-07 NOTE — ED Notes (Signed)
Pt assisted off the bedpan, states she immediately needs to use it again. Pt noted to have 1 episode of large amounts of light brown stool at this time. Pt placed back on bedpan. Medications administered per MD order. Call bell remains within reach. Pt states understanding to push call bell when done.

## 2019-12-07 NOTE — ED Notes (Signed)
Meal tray delivered by dining services  

## 2019-12-07 NOTE — ED Notes (Signed)
This RN to bedside, medications administered per MD order. Pt assisted on the bedpan at this time by this RN. Pt tolerated well. Pt states understanding to push call bell when done.

## 2019-12-07 NOTE — ED Notes (Signed)
This RN to bedside, introduced self to patient who awakens easily with verbal stimuli. Bloodwork recollected by this RN. Pt denies any needs. Apologized for delay with breakfast trays. Medications administered per orders. Pt tolerated well. Pt repositioned self in bed. Pt is alert and oriented, TV turned on for patient at this time. Pt denies further needs. Suction emptied for purewick with is patent and draining at this time.

## 2019-12-07 NOTE — Consult Note (Signed)
[facility] CONSULT NOTE    Date: 12/07/2019                  Patient Name:  Maria Singh  MRN: [id number]  DOB: February 16, 1951  Age / Sex: 69 y.o., female         PCP: Glean Hess, MD                 Service Requesting Consult:  Hospitalist                 Reason for Consult:  Hyperkalemia            History of Present Illness: Patient is a 69 y.o. female with a PMHx of Charcot-Marie-Tooth disorder, depression, diabetes mellitus type 2, GERD, hyperlipidemia, hypertension, history of left rotator cuff injury, who was admitted to [facility] on 12/06/2019 for evaluation of weakness.  Patient currently wheelchair-bound but able to transfer at home.  Recently however she developed the inability to transfer off of her stool and back onto her motorized chair.  Work-up in the emergency department demonstrated significant hyperkalemia with a serum potassium of 6.7.  Creatinine was normal at 0.8.  Mild rhabdomyolysis noted with a CK of 564.  Patient has been started on Veltassa and is on 8.4 g p.o. daily.   Medications: Outpatient medications: (Not in a hospital admission)   Current medications: Current Facility-Administered Medications  Medication Dose Route Frequency Provider Last Rate Last Admin  . acetaminophen (TYLENOL) tablet 650 mg  650 mg Oral Q6H PRN Athena Masse, MD       Or  . acetaminophen (TYLENOL) suppository 650 mg  650 mg Rectal Q6H PRN Athena Masse, MD      . enoxaparin (LOVENOX) injection 40 mg  40 mg Subcutaneous Q12H Athena Masse, MD   40 mg at 12/07/19 0849  . insulin aspart (novoLOG) injection 0-15 Units  0-15 Units Subcutaneous TID WC Enzo Bi, MD   2 Units at 12/07/19 1240  . insulin aspart (novoLOG) injection 0-5 Units  0-5 Units Subcutaneous QHS Enzo Bi, MD      . insulin glargine (LANTUS) injection 30 Units  30 Units Subcutaneous Daily Enzo Bi, MD   30 Units at 12/07/19 1240  . ondansetron (ZOFRAN) tablet 4 mg  4 mg Oral Q6H  PRN Athena Masse, MD       Or  . ondansetron [facility]) injection 4 mg  4 mg Intravenous Q6H PRN Athena Masse, MD      . patiromer Daryll Drown) packet 8.4 g  8.4 g Oral Daily Lavonia Drafts, MD   8.4 g at 12/07/19 0028  . senna-docusate (Senokot-S) tablet 1 tablet  1 tablet Oral QHS PRN Athena Masse, MD       Current Outpatient Medications  Medication Sig Dispense Refill  . aspirin 81 MG tablet Take 81 mg by mouth daily.    . clotrimazole-betamethasone (LOTRISONE) cream Apply 1 application topically 2 (two) times daily. 30 g 1  . ferrous sulfate 325 (65 FE) MG tablet Take 1 tablet (325 mg total) by mouth daily with breakfast. 90 tablet 1  . HUMALOG KWIKPEN 100 UNIT/ML KwikPen INJECT 22 UNITS TOTAL INTO THE SKIN 3 THREE TIMES DAILY. 60 mL 87  . LANTUS SOLOSTAR 100 UNIT/ML Solostar Pen INJECT 60 UNITS UNDER THE SKIN IN THE MORNING AND 50 UNITS UNDER THE SKIN IN THE EVENING 90 mL prn  . liraglutide (VICTOZA) 18 MG/3ML SOPN INJECT 1.8  MLS (10.8 MG TOTAL) INTO THE SKIN ONCE DAILY 27 mL 5  . losartan (COZAAR) 25 MG tablet Take 1 tablet (25 mg total) by mouth daily. 90 tablet 3  . metFORMIN (GLUCOPHAGE) 1000 MG tablet Take 1 tablet (1,000 mg total) by mouth 2 (two) times daily. 270 tablet 3  . mupirocin ointment (BACTROBAN) 2 % APPLY TO AFFECTED AREA(S) AS NEEDED 30 g 2  . nystatin cream (MYCOSTATIN) Apply topically 2 (two) times daily. 60 g 5  . rosuvastatin (CRESTOR) 5 MG tablet Take 1 tablet (5 mg total) by mouth daily. 90 tablet 3  . sertraline (ZOLOFT) 100 MG tablet TAKE 1 TABLET BY MOUTH DAILY. (Patient taking differently: Take 100 mg by mouth daily. ) 90 tablet 3  . Accu-Chek FastClix Lancets MISC TEST ONCE DAILY 102 each 2  . ACCU-CHEK GUIDE test strip CHECK BLOOD SUGAR TWO TIMES DAILY 180 each 1  . ACCU-CHEK GUIDE test strip CHECK BLOOD SUGAR TWO TIMES DAILY 100 each 0  . ACCU-CHEK GUIDE test strip CHECK BLOOD SUGAR TWO TIMES DAILY 100 strip 2  . acetaminophen (TYLENOL) 500 MG tablet  Take 500 mg by mouth every 6 (six) hours as needed.    . cephALEXin (KEFLEX) 500 MG capsule Take 1 capsule (500 mg total) by mouth 4 (four) times daily for 10 days. 40 capsule 0  . docusate sodium (COLACE) 100 MG capsule Take 100 mg by mouth daily.     . Insulin Pen Needle (NOVOFINE) 32G X 6 MM MISC USE AS DIRECTED 3 TIMES A DAY 300 each 3  . loratadine (CLARITIN) 10 MG tablet Take 10 mg by mouth daily.    . NON FORMULARY Auto CPap nightly        Allergies: Allergies  Allergen Reactions  . Metronidazole And Related Nausea Only    Mainly oral  . Sulfa Antibiotics Rash      Past Medical History: Past Medical History:  Diagnosis Date  . 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 Depression   . Diabetes mellitus without complication ([place])   . GERD (gastroesophageal reflux disease)   . Hyperlipidemia   . Hypertension   . Rotator cuff injury Nov. 2012   left arm  . Sepsis ([place]) 07/16/2018  . Sepsis ([place]) 07/16/2018  . Sleep apnea      Past Surgical History: Past Surgical History:  Procedure Laterality Date  . arm surgery Left    broken arm  . HERNIA REPAIR       Family History: Family History  Problem Relation Age of Onset  . Cancer Mother   . Heart disease Father   . Diabetes Maternal Grandmother   . Heart disease Paternal Grandfather   . Breast cancer Maternal Aunt        mat great aunt     Social History: Social History   Socioeconomic History  . Marital status: Married    Spouse name: Not on file  . Number of children: Not on file  . Years of education: Not on file  . Highest education level: Not on file  Occupational History  . Not on file  Tobacco Use  . Smoking status: Never Smoker  . Smokeless tobacco: Never Used  Substance and Sexual Activity  . Alcohol use: Yes    Alcohol/week: 0.0 standard drinks    Comment: very very rare  . Drug use: No  . Sexual activity: Never  Other Topics Concern  . Not on file  Social History Narrative  .  Not on  file   Social Determinants of Health   Financial Resource Strain:   . Difficulty of Paying Living Expenses: Not on file  Food Insecurity:   . Worried About Charity fundraiser in the Last Year: Not on file  . Ran Out of Food in the Last Year: Not on file  Transportation Needs:   . Lack of Transportation (Medical): Not on file  . Lack of Transportation (Non-Medical): Not on file  Physical Activity:   . Days of Exercise per Week: Not on file  . Minutes of Exercise per Session: Not on file  Stress:   . Feeling of Stress : Not on file  Social Connections:   . Frequency of Communication with Friends and Family: Not on file  . Frequency of Social Gatherings with Friends and Family: Not on file  . Attends Religious Services: Not on file  . Active Member of Clubs or Organizations: Not on file  . Attends Archivist Meetings: Not on file  . Marital Status: Not on file  Intimate Partner Violence:   . Fear of Current or Ex-Partner: Not on file  . Emotionally Abused: Not on file  . Physically Abused: Not on file  . Sexually Abused: Not on file     Review of Systems: Review of Systems  Constitutional: Positive for malaise/fatigue. Negative for chills and fever.  HENT: Negative for congestion, hearing loss and tinnitus.   Eyes: Negative for blurred vision and double vision.  Respiratory: Negative for cough, sputum production and shortness of breath.   Cardiovascular: Negative for chest pain, palpitations and orthopnea.  Gastrointestinal: Negative for diarrhea, nausea and vomiting.  Genitourinary: Negative for dysuria, frequency and urgency.  Musculoskeletal: Negative for myalgias.  Skin: Negative for itching and rash.  Neurological: Positive for weakness. Negative for dizziness and focal weakness.  Endo/Heme/Allergies: Negative for polydipsia. Does not bruise/bleed easily.  Psychiatric/Behavioral: Negative for depression. The patient is nervous/anxious.      Vital  Signs: Blood pressure (!) 169/74, pulse 74, temperature 98.4 F (36.9 C), resp. rate 19, height 5\' 5"  (1.651 m), weight (!) 138.3 kg, SpO2 94 %.  Weight trends: Filed Weights   12/06/19 1834  Weight: (!) 138.3 kg    Physical Exam: General: NAD, laying in bed  Head: Normocephalic, atraumatic.  Eyes: Anicteric, EOMI  Nose: Mucous membranes moist, not inflammed, nonerythematous.  Throat: Oropharynx nonerythematous, no exudate appreciated.   Neck: Supple, trachea midline.  Lungs:  Normal respiratory effort. Clear to auscultation BL without crackles or wheezes.  Heart: RRR. S1 and S2 normal without gallop, murmur, or rubs.  Abdomen:  BS normoactive. Soft, Nondistended, non-tender.  No masses or organomegaly.  Extremities: trace pretibial edema.  Neurologic: Awake, alert, conversant  Skin: No visible rashes, scars.    Lab results: Basic Metabolic Panel: Recent Labs  Lab 12/06/19 1837 12/06/19 2105 12/06/19 2344 12/06/19 2344 12/07/19 0145 12/07/19 0145 12/07/19 0604 12/07/19 0850 12/07/19 1319  NA   < >  --  140  --  139  --  140  --   --   K   < >  --  6.4*   < > 6.2*   < > 5.9* 6.2* 6.0*  CL   < >  --  115*  --  113*  --  114*  --   --   CO2   < >  --  18*  --  19*  --  20*  --   --  GLUCOSE   < >  --  99  --  98  --  212*  --   --   BUN   < >  --  32*  --  32*  --  30*  --   --   CREATININE   < >  --  0.77  --  0.91  --  0.91  --   --   CALCIUM   < >  --  9.0  --  9.4  --  9.6  --   --   MG  --  2.1  --   --   --   --   --   --   --    < > = values in this interval not displayed.    Liver Function Tests: No results for input(s): AST, ALT, ALKPHOS, BILITOT, PROT, ALBUMIN in the last 168 hours. No results for input(s): LIPASE, AMYLASE in the last 168 hours. No results for input(s): AMMONIA in the last 168 hours.  CBC: Recent Labs  Lab 12/06/19 1837 12/07/19 0145 12/07/19 0604  WBC 8.9 8.9 7.7  HGB 11.7* 10.8* 11.2*  HCT 36.9 33.9* 35.2*  MCV 88.7 88.7 89.3   PLT 199 175 168    Cardiac Enzymes: Recent Labs  Lab 12/06/19 2105 12/07/19 0604  CKTOTAL 564* 470*    BNP: Invalid input(s): POCBNP  CBG: Recent Labs  Lab 12/07/19 1221  GLUCAP 135*    Microbiology: Results for orders placed or performed during the hospital encounter of 12/06/19  SARS CORONAVIRUS 2 (TAT 6-24 HRS) Nasopharyngeal Nasopharyngeal Swab     Status: None   Collection Time: 12/06/19  9:05 PM   Specimen: Nasopharyngeal Swab  Result Value Ref Range Status   SARS Coronavirus 2 NEGATIVE NEGATIVE Final    Comment: (NOTE) SARS-CoV-2 target nucleic acids are NOT DETECTED. The SARS-CoV-2 RNA is generally detectable in upper and lower respiratory specimens during the acute phase of infection. Negative results do not preclude SARS-CoV-2 infection, do not rule out co-infections with other pathogens, and should not be used as the sole basis for treatment or other patient management decisions. Negative results must be combined with clinical observations, patient history, and epidemiological information. The expected result is Negative. Fact Sheet for Patients: SugarRoll.be Fact Sheet for Healthcare Providers: https://www.woods-mathews.com/ This test is not yet approved or cleared by the [place] FDA and  has been authorized for detection and/or diagnosis of SARS-CoV-2 by FDA under an Emergency Use Authorization (EUA). This EUA will remain  in effect (meaning this test can be used) for the duration of the COVID-19 declaration under Section 56 4(b)(1) of the Act, 21 U.S.C. section 360bbb-3(b)(1), unless the authorization is terminated or revoked sooner. Performed at [facility], [place] [address] [place], [place] [address]   Blood culture (routine x 2)     Status: None (Preliminary result)   Collection Time: 12/06/19  9:05 PM   Specimen: BLOOD RIGHT FOREARM  Result Value Ref Range Status   Specimen Description  BLOOD RIGHT FOREARM  Final   Special Requests   Final    Blood Culture results may not be optimal due to an excessive volume of blood received in culture bottles   Culture   Final    NO GROWTH < 12 HOURS Performed at [facility], [address] [place],  [address]    Report Status PENDING  Incomplete  Blood culture (routine x 2)     Status: None (  Preliminary result)   Collection Time: 12/06/19  9:06 PM   Specimen: Left Antecubital; Blood  Result Value Ref Range Status   Specimen Description LEFT ANTECUBITAL  Final   Special Requests   Final    Blood Culture results may not be optimal due to an excessive volume of blood received in culture bottles   Culture   Final    NO GROWTH < 12 HOURS Performed at [facility], [address] [place], [place] [address]    Report Status PENDING  Incomplete    Coagulation Studies: No results for input(s): LABPROT, INR in the last 72 hours.  Urinalysis: Recent Labs    12/06/19 2105  COLORURINE STRAW*  LABSPEC 1.006  PHURINE 5.0  GLUCOSEU NEGATIVE  HGBUR NEGATIVE  BILIRUBINUR NEGATIVE  KETONESUR NEGATIVE  PROTEINUR NEGATIVE  NITRITE NEGATIVE  LEUKOCYTESUR NEGATIVE      Imaging: DG Chest 1 View  Result Date: 12/06/2019 CLINICAL DATA:  Weakness EXAM: CHEST  1 VIEW COMPARISON:  11/25/2018, 07/16/2018 FINDINGS: Mild cardiomegaly without overt failure. Interstitial and patchy basilar opacity, suspect chronic change. No acute airspace disease, pleural effusion, or pneumothorax. IMPRESSION: No active disease.  Mild cardiomegaly Electronically Signed   By: Donavan Foil M.D.   On: 12/06/2019 21:17      Assessment & Plan: Pt is a 69 y.o. female with a PMHx of Charcot-Marie-Tooth disorder, depression, diabetes mellitus type 2, GERD, hyperlipidemia, hypertension, history of left rotator cuff injury, who was admitted to [facility] on 12/06/2019 for evaluation of weakness.  1.  Hyperkalemia.  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  Increase Veltassa to 16.8 g p.o. daily.  Further plan as patient progresses.  We are hesitant to add fludrocortisone at this time as the patient does have hypertension.  2.  Hypertension.  Would avoid ACE inhibitor or ARB given the hyperkalemia.  3.  Thanks for consultation.  Further plan as patient progresses.

## 2019-12-08 LAB — MAGNESIUM: Magnesium: 1.8 mg/dL (ref 1.7–2.4)

## 2019-12-08 LAB — BASIC METABOLIC PANEL
Anion gap: 8 (ref 5–15)
BUN: 29 mg/dL — ABNORMAL HIGH (ref 8–23)
CO2: 20 mmol/L — ABNORMAL LOW (ref 22–32)
Calcium: 9 mg/dL (ref 8.9–10.3)
Chloride: 112 mmol/L — ABNORMAL HIGH (ref 98–111)
Creatinine, Ser: 0.82 mg/dL (ref 0.44–1.00)
GFR calc Af Amer: >60 mL/min (ref >60)
GFR calc non Af Amer: >60 mL/min (ref >60)
Glucose, Bld: 134 mg/dL — ABNORMAL HIGH (ref 70–99)
Potassium: 4.6 mmol/L (ref 3.5–5.1)
Sodium: 140 mmol/L (ref 135–145)

## 2019-12-08 LAB — CBC
HCT: 35.1 % — ABNORMAL LOW (ref 36.0–46.0)
Hemoglobin: 11.1 g/dL — ABNORMAL LOW (ref 12.0–15.0)
MCH: 28.2 pg (ref 26.0–34.0)
MCHC: 31.6 g/dL (ref 30.0–36.0)
MCV: 89.1 fL (ref 80.0–100.0)
Platelets: 175 10*3/uL (ref 150–400)
RBC: 3.94 MIL/uL (ref 3.87–5.11)
RDW: 14.9 % (ref 11.5–15.5)
WBC: 7.3 10*3/uL (ref 4.0–10.5)
nRBC: 0 % (ref 0.0–0.2)

## 2019-12-08 LAB — GLUCOSE, CAPILLARY
Glucose-Capillary: 130 mg/dL — ABNORMAL HIGH (ref 70–99)
Glucose-Capillary: 130 mg/dL — ABNORMAL HIGH (ref 70–99)

## 2019-12-08 LAB — POTASSIUM
Potassium: 4.8 mmol/L (ref 3.5–5.1)
Potassium: 4.6 mmol/L (ref 3.5–5.1)
Potassium: 4.7 mmol/L (ref 3.5–5.1)

## 2019-12-08 MED ORDER — SERTRALINE HCL 100 MG PO TABS: 100.0000 mg | ORAL_TABLET | Freq: Every day | ORAL | Status: DC

## 2019-12-08 MED ORDER — HYDROCHLOROTHIAZIDE 25 MG PO TABS: 25.0000 mg | ORAL_TABLET | Freq: Every day | ORAL | 2 refills | Status: DC

## 2019-12-08 MED ORDER — PATIROMER SORBITEX CALCIUM 8.4 G PO PACK: 8.4000 g | PACK | Freq: Every day | ORAL | Status: DC

## 2019-12-08 NOTE — Patient Outreach (Signed)
Notified Noreene Larsson, RN [facility] Care Management Embedded Practice Care Coordinator at [facility] of Ms Durden admission.

## 2019-12-08 NOTE — Clinical Social Work Note (Addendum)
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 A walker will not resolve this issue with performing activities of daily living. A wheelchair will allow patient to safely perform daily activities. Length of need: Indefinite. Accessories: Elevating leg rests (ELRs), wheel locks, extensions and anti-tippers, and back cushion.  Dayton Scrape, [place]

## 2019-12-08 NOTE — Discharge Instructions (Signed)
Hyperkalemia Hyperkalemia is when you have too much potassium in your blood. Potassium helps your body in many ways, but having too much can cause problems. If there is too much potassium in your blood, it can affect how your heart works. Potassium is normally removed from your body by your kidneys. Many things can cause the amount in your blood to be high. Medicines and other treatments can be used to bring the amount to a normal level. Treatment may need to be done in the hospital. Follow these instructions at home:   Take over-the-counter and prescription medicines only as told by your doctor.  Do not take any of the following unless your doctor says it is okay: ? Supplements. ? Natural products. ? Herbs. ? Vitamins.  Limit how much alcohol you drink as told by your doctor.  Do not use drugs. If you need help quitting, ask your doctor.  If you have kidney disease, you may need to follow a low-potassium diet. A food specialist (dietitian) can help you.  Keep all follow-up visits as told by your doctor. This is important. Contact a doctor if:  Your heartbeat is not regular or is very slow.  You feel dizzy (light-headed).  You feel weak.  You feel sick to your stomach (nauseous).  You have tingling in your hands or feet.  You lose feeling (have numbness) in your hands or feet. Get help right away if:  You are short of breath.  You have chest pain.  You pass out (faint).  You cannot move your muscles. Summary  Hyperkalemia is when you have too much potassium in your blood.  Take over-the-counter and prescription medicines only as told by your doctor.  Limit how much alcohol you drink as told by your doctor.  Contact a doctor if your heartbeat is not regular. This information is not intended to replace advice given to you by your health care provider. Make sure you discuss any questions you have with your health care provider. Document Revised: 09/20/2017 Document  Reviewed: 09/20/2017 Elsevier Patient Education  [place].   Fatigue If you have fatigue, you feel tired all the time and have a lack of energy or a lack of motivation. Fatigue may make it difficult to start or complete tasks because of exhaustion. In general, occasional or mild fatigue is often a normal response to activity or life. However, long-lasting (chronic) or extreme fatigue may be a symptom of a medical condition. Follow these instructions at home: General instructions  Watch your fatigue for any changes.  Go to bed and get up at the same time every day.  Avoid fatigue by pacing yourself during the day and getting enough sleep at night.  Maintain a healthy weight. Medicines  Take over-the-counter and prescription medicines only as told by your health care provider.  Take a multivitamin, if told by your health care provider.  Do not use herbal or dietary supplements unless they are approved by your health care provider. Activity   Exercise regularly, as told by your health care provider.  Use or practice techniques to help you relax, such as yoga, tai chi, meditation, or massage therapy. Eating and drinking   Avoid heavy meals in the evening.  Eat a well-balanced diet, which includes lean proteins, whole grains, plenty of fruits and vegetables, and low-fat dairy products.  Avoid consuming too much caffeine.  Avoid the use of alcohol.  Drink enough fluid to keep your urine pale yellow. Lifestyle  Change situations  that cause you stress. Try to keep your work and personal schedule in balance.  Do not use any products that contain nicotine or tobacco, such as cigarettes and e-cigarettes. If you need help quitting, ask your health care provider.  Do not use drugs. Contact a health care provider if:  Your fatigue does not get better.  You have a fever.  You suddenly lose or gain weight.  You have headaches.  You have trouble falling asleep or  sleeping through the night.  You feel angry, guilty, anxious, or sad.  You are unable to have a bowel movement (constipation).  Your skin is dry.  You have swelling in your legs or another part of your body. Get help right away if:  You feel confused.  Your vision is blurry.  You feel faint or you pass out.  You have a severe headache.  You have severe pain in your abdomen, your back, or the area between your waist and hips (pelvis).  You have chest pain, shortness of breath, or an irregular or fast heartbeat.  You are unable to urinate, or you urinate less than normal.  You have abnormal bleeding, such as bleeding from the rectum, vagina, nose, lungs, or nipples.  You vomit blood.  You have thoughts about hurting yourself or others. If you ever feel like you may hurt yourself or others, or have thoughts about taking your own life, get help right away. You can go to your nearest emergency department or call:  Your local emergency services (911 in the [place]  A suicide crisis helpline, such as the [place] at (949)403-7838. This is open 24 hours a day. Summary  If you have fatigue, you feel tired all the time and have a lack of energy or a lack of motivation.  Fatigue may make it difficult to start or complete tasks because of exhaustion.  Long-lasting (chronic) or extreme fatigue may be a symptom of a medical condition.  Exercise regularly, as told by your health care provider.  Change situations that cause you stress. Try to keep your work and personal schedule in balance. This information is not intended to replace advice given to you by your health care provider. Make sure you discuss any questions you have with your health care provider. Document Revised: 04/26/2019 Document Reviewed: 06/30/2017 Elsevier Patient Education  [place].   Hyperkalemia Hyperkalemia is when you have too much potassium in your blood. Potassium  helps your body in many ways, but having too much can cause problems. If there is too much potassium in your blood, it can affect how your heart works. Potassium is normally removed from your body by your kidneys. Many things can cause the amount in your blood to be high. Medicines and other treatments can be used to bring the amount to a normal level. Treatment may need to be done in the hospital. Follow these instructions at home:   Take over-the-counter and prescription medicines only as told by your doctor.  Do not take any of the following unless your doctor says it is okay: ? Supplements. ? Natural products. ? Herbs. ? Vitamins.  Limit how much alcohol you drink as told by your doctor.  Do not use drugs. If you need help quitting, ask your doctor.  If you have kidney disease, you may need to follow a low-potassium diet. A food specialist (dietitian) can help you.  Keep all follow-up visits as told by your doctor. This is important. Contact  a doctor if:  Your heartbeat is not regular or is very slow.  You feel dizzy (light-headed).  You feel weak.  You feel sick to your stomach (nauseous).  You have tingling in your hands or feet.  You lose feeling (have numbness) in your hands or feet. Get help right away if:  You are short of breath.  You have chest pain.  You pass out (faint).  You cannot move your muscles. Summary  Hyperkalemia is when you have too much potassium in your blood.  Take over-the-counter and prescription medicines only as told by your doctor.  Limit how much alcohol you drink as told by your doctor.  Contact a doctor if your heartbeat is not regular. This information is not intended to replace advice given to you by your health care provider. Make sure you discuss any questions you have with your health care provider. Document Revised: 09/20/2017 Document Reviewed: 09/20/2017 Elsevier Patient Education  [place].

## 2019-12-08 NOTE — Progress Notes (Signed)
[facility]  ROUNDING NOTE   Subjective:  Patient seen and evaluated at bedside. Currently sitting up in motorized wheelchair. She states that her weakness has improved partially. Potassium now normalized to 4.6.   Objective:  Vital signs in last 24 hours:  Temp:  [98.1 F (36.7 C)-99.2 F (37.3 C)] 98.1 F (36.7 C) (02/19 0744) Pulse Rate:  [69-83] 69 (02/19 0744) Resp:  [18-27] 18 (02/19 0744) BP: (143-162)/(55-72) 149/59 (02/19 0744) SpO2:  [95 %-97 %] 95 % (02/19 0744)  Weight change:  Filed Weights   12/06/19 1834  Weight: (!) 138.3 kg    Intake/Output: I/O last 3 completed shifts: In: -  Out: 2950 [Urine:2950]   Intake/Output this shift:  Total I/O In: -  Out: 650 [Urine:650]  Physical Exam: General: No acute distress  Head: Normocephalic, atraumatic. Moist oral mucosal membranes  Eyes: Anicteric  Neck: Supple, trachea midline  Lungs:  Clear to auscultation, normal effort  Heart: S1S2 no rubs  Abdomen:  Soft, nontender, bowel sounds present  Extremities: Trace peripheral edema.  Neurologic: Awake, alert, following commands  Skin: No lesions       Basic Metabolic Panel: Recent Labs  Lab 12/06/19 1837 12/06/19 1837 12/06/19 2105 12/06/19 2344 12/06/19 2344 12/07/19 0145 12/07/19 0145 12/07/19 0604 12/07/19 0850 12/07/19 2043 12/08/19 0056 12/08/19 0610 12/08/19 0935 12/08/19 1413  NA 135  --   --  140  --  139  --  140  --   --   --  140  --   --   K 6.7*   < >  --  6.4*   < > 6.2*   < > 5.9*   < > 5.3* 4.8 4.6 4.6 4.7  CL 110  --   --  115*  --  113*  --  114*  --   --   --  112*  --   --   CO2 15*  --   --  18*  --  19*  --  20*  --   --   --  20*  --   --   GLUCOSE 96  --   --  99  --  98  --  212*  --   --   --  134*  --   --   BUN 32*  --   --  32*  --  32*  --  30*  --   --   --  29*  --   --   CREATININE 0.83  --   --  0.77  --  0.91  --  0.91  --   --   --  0.82  --   --   CALCIUM 9.4   < >  --  9.0   < > 9.4  --   9.6  --   --   --  9.0  --   --   MG  --   --  2.1  --   --   --   --   --   --   --   --  1.8  --   --    < > = values in this interval not displayed.    Liver Function Tests: No results for input(s): AST, ALT, ALKPHOS, BILITOT, PROT, ALBUMIN in the last 168 hours. No results for input(s): LIPASE, AMYLASE in the last 168 hours. No results for input(s): AMMONIA in the last 168 hours.  CBC: Recent Labs  Lab 12/06/19 1837 12/07/19 0145 12/07/19 0604 12/08/19 0610  WBC 8.9 8.9 7.7 7.3  HGB 11.7* 10.8* 11.2* 11.1*  HCT 36.9 33.9* 35.2* 35.1*  MCV 88.7 88.7 89.3 89.1  PLT 199 175 168 175    Cardiac Enzymes: Recent Labs  Lab 12/06/19 2105 12/07/19 0604  CKTOTAL 564* 470*    BNP: Invalid input(s): POCBNP  CBG: Recent Labs  Lab 12/07/19 1221 12/07/19 1647 12/07/19 2137 12/08/19 0742 12/08/19 1206  GLUCAP 135* 140* 123* 130* 130*    Microbiology: Results for orders placed or performed during the hospital encounter of 12/06/19  SARS CORONAVIRUS 2 (TAT 6-24 HRS) Nasopharyngeal Nasopharyngeal Swab     Status: None   Collection Time: 12/06/19  9:05 PM   Specimen: Nasopharyngeal Swab  Result Value Ref Range Status   SARS Coronavirus 2 NEGATIVE NEGATIVE Final    Comment: (NOTE) SARS-CoV-2 target nucleic acids are NOT DETECTED. The SARS-CoV-2 RNA is generally detectable in upper and lower respiratory specimens during the acute phase of infection. Negative results do not preclude SARS-CoV-2 infection, do not rule out co-infections with other pathogens, and should not be used as the sole basis for treatment or other patient management decisions. Negative results must be combined with clinical observations, patient history, and epidemiological information. The expected result is Negative. Fact Sheet for Patients: SugarRoll.be Fact Sheet for Healthcare Providers: https://www.woods-mathews.com/ This test is not yet approved or  cleared by the [place] FDA and  has been authorized for detection and/or diagnosis of SARS-CoV-2 by FDA under an Emergency Use Authorization (EUA). This EUA will remain  in effect (meaning this test can be used) for the duration of the COVID-19 declaration under Section 56 4(b)(1) of the Act, 21 U.S.C. section 360bbb-3(b)(1), unless the authorization is terminated or revoked sooner. Performed at [facility], [place] [address], [place], [place] [address]   Blood culture (routine x 2)     Status: None (Preliminary result)   Collection Time: 12/06/19  9:05 PM   Specimen: BLOOD RIGHT FOREARM  Result Value Ref Range Status   Specimen Description BLOOD RIGHT FOREARM  Final   Special Requests   Final    Blood Culture results may not be optimal due to an excessive volume of blood received in culture bottles   Culture   Final    NO GROWTH 2 DAYS Performed at [facility], [address], [place], [place] [address]    Report Status PENDING  Incomplete  Blood culture (routine x 2)     Status: None (Preliminary result)   Collection Time: 12/06/19  9:06 PM   Specimen: Left Antecubital; Blood  Result Value Ref Range Status   Specimen Description LEFT ANTECUBITAL  Final   Special Requests   Final    Blood Culture results may not be optimal due to an excessive volume of blood received in culture bottles   Culture   Final    NO GROWTH 2 DAYS Performed at [facility], [place] [place],  [address]    Report Status PENDING  Incomplete    Coagulation Studies: No results for input(s): LABPROT, INR in the last 72 hours.  Urinalysis: Recent Labs    12/06/19 2105  COLORURINE STRAW*  LABSPEC 1.006  PHURINE 5.0  GLUCOSEU NEGATIVE  HGBUR NEGATIVE  BILIRUBINUR NEGATIVE  KETONESUR NEGATIVE  PROTEINUR NEGATIVE  NITRITE NEGATIVE  LEUKOCYTESUR NEGATIVE      Imaging: DG Chest 1 View  Result Date: 12/06/2019 CLINICAL DATA:  Weakness EXAM: CHEST  1  VIEW COMPARISON:  11/25/2018, 07/16/2018 FINDINGS: Mild cardiomegaly without overt failure. Interstitial and patchy basilar opacity, suspect chronic change. No acute airspace disease, pleural effusion, or pneumothorax. IMPRESSION: No active disease.  Mild cardiomegaly Electronically Signed   By: Donavan Foil M.D.   On: 12/06/2019 21:17     Medications:    . enoxaparin (LOVENOX) injection  40 mg Subcutaneous Q12H  . insulin aspart  0-15 Units Subcutaneous TID WC  . insulin aspart  0-5 Units Subcutaneous QHS  . insulin glargine  30 Units Subcutaneous Daily  . patiromer  16.8 g Oral Daily   acetaminophen **OR** acetaminophen, ondansetron **OR** ondansetron (ZOFRAN) IV, senna-docusate  Assessment/ Plan:  69 y.o. female with a PMHx of Charcot-Marie-Tooth disorder, depression, diabetes mellitus type 2, GERD, hyperlipidemia, hypertension, history of left rotator cuff injury, who was admitted to [facility] on 12/06/2019 for evaluation of weakness.  1.  Hyperkalemia.  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  Potassium much better today.  Currently down to 4.6.  Reduce Veltassa to 8.4 g p.o. daily.  She will need follow-up of this as an outpatient in the office.  2.  Hypertension.  Currently off of antihypertensives.  Continue to monitor blood pressure closely.  3.  Disposition as per hospitalist.   LOS: 1 Akita Maxim 2/19/20213:46 PM

## 2019-12-08 NOTE — TOC Transition Note (Addendum)
Transition of Care [facility]) - CM/SW Discharge Note   Patient Details  Name: Maria Singh MRN: [id number] Date of Birth: 01/10/1951  Transition of Care [facility]) CM/SW Contact:  Magnus Ivan, LCSW Phone Number: 12/08/2019, 2:41 PM   Clinical Narrative:   CSW met with patient at bedside. Patient to discharge home today. Patient was agreeable to recommendation for Home Health PT. List of agencies provided, patient did not have a preference in agencies. Cory at [facility] reported they can accept patient. Patient was informed and satisfied with this. Patient requested a manual wheelchair. Patient also asked if she would be eligible for a new power wheelchair. CSW informed [employer] Representative Sunday Corn for follow up. Patient said she has a power wheelchair, hoyer lift, and shower chair at home. Patient denied additional needs at this time.   4:20: Informed patient that manual wheelchair will be delivered to the home and company will follow up with her about power wheelchair. Patient verbalized understanding. Patient stated her son is coming to pick her up today in her [name]. Patient denied other needs. CSW signing off.    Final next level of care: [facility] for PT) Barriers to Discharge: Barriers Resolved   Patient Goals and CMS Choice   CMS Medicare.gov Compare Post Acute Care list provided to:: Patient Choice offered to / list presented to : Patient  Discharge Placement                       Discharge Plan and Services                DME Arranged: Wheelchair manual DME Agency: [employer] Date DME Agency Contacted: 12/08/19 Time DME Agency Contacted: 1400 Representative spoke with at DME Agency: [place]: PT [place]: [place] Date [place]: 12/08/19 Time Loop Agency Contacted: 1400 Representative spoke with at [place]: [place]) Interventions     Readmission  Risk Interventions No flowsheet data found.

## 2019-12-08 NOTE — Discharge Summary (Signed)
Physician Discharge Summary   [name]  female DOB: 1951-02-06  [id number]  PCP: Glean Hess, MD  Admit date: 12/06/2019 Discharge date: 12/08/2019  Admitted From: home Disposition:  home Home Health: Yes CODE STATUS: Full code  Discharge Instructions    Diet - low sodium heart healthy   Complete by: As directed    Discharge instructions   Complete by: As directed    Because your potassium level was high, kidney doctor wants you to stop taking your blood pressure medication losartan (COZAAR) because that type of medication can elevated potassium.  Also avoid eating high-potassium foods.  I have ordered hydrochlorothiazide as your new blood pressure medication.  Please follow up with your PCP in about 1 week to check your potassium level and also how your blood pressure is doing on the new blood pressure medication.   Dr. Enzo Bi - -   Increase activity slowly   Complete by: As directed        Hospital Course:  For full details, please see H&P, progress notes, consult notes and ancillary notes.  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   Hyperkalemia Generalized weakness Acute nontraumatic rhabdomyolysis Etiology of hyperkalemia uncertain, but likely due to excess intake in potassium-rich foods. Renal function normal.  Patient did state that her daughter was hospitalized with low potassium and they had been eating a high potassium diet (pt was snacking on a lot of dried apricots).  Pt  received temporizing agents with Insulin and dextrose, and Veltassa and Kayexalate x2.  Pt had several BM's and K+ finally normalized on the day of discharge.  Nephrology was consulted who recommended avoiding ACEi and ARB.    OSA on CPAP Continued home CPAP  Type II diabetes mellitus with complication/neuropathy(HCC) On high doses of basal and premeal insulin.  Pt was discharged back on home regimen.  Morbid obesity with BMI of 50.0-59.9, adult ([place]) Ambulatory dysfunction At baseline, pt is mostly wheelchair-bound but can transfer.  PT eval recommended SNF rehab, however, pt refused.  Pt was ordered DME and HHPT prior to discharge.    # HTN Due to hyperkalemia, pt's home Losartan was discontinued.  HCTZ 25 mg daily was ordered instead on discharge.     Discharge Diagnoses:  Principal Problem:   Rhabdomyolysis Active Problems:   OSA on CPAP   Type II diabetes mellitus with complication (HCC)   Hyperkalemia   Morbid obesity with BMI of 50.0-59.9, adult [facility])   Ambulatory dysfunction    Discharge Instructions:  Allergies as of 12/08/2019      Reactions   Metronidazole And Related Nausea Only   Mainly oral   Sulfa Antibiotics Rash      Medication List    STOP taking these medications   cephALEXin 500 MG capsule Commonly known as: KEFLEX   losartan 25 MG tablet Commonly known as: COZAAR     TAKE these medications   Accu-Chek FastClix Lancets Misc TEST ONCE DAILY   Accu-Chek Guide test strip Generic drug: glucose blood CHECK BLOOD SUGAR TWO TIMES DAILY  Accu-Chek Guide test strip Generic drug: glucose blood CHECK BLOOD SUGAR TWO TIMES DAILY   Accu-Chek Guide test strip Generic drug: glucose blood CHECK BLOOD SUGAR TWO TIMES DAILY   acetaminophen 500 MG tablet Commonly known as: TYLENOL Take 500 mg by mouth every 6 (six) hours as needed.   aspirin 81 MG tablet Take 81 mg by mouth daily.   clotrimazole-betamethasone cream Commonly known as:  Lotrisone Apply 1 application topically 2 (two) times daily.   docusate sodium 100 MG capsule Commonly known as: COLACE Take 100 mg by mouth daily.   ferrous sulfate 325 (65 FE) MG tablet Take 1 tablet (325 mg total) by mouth daily with breakfast.   HumaLOG KwikPen 100 UNIT/ML KwikPen Generic drug: insulin lispro INJECT 22 UNITS TOTAL INTO THE SKIN 3 THREE TIMES DAILY.   hydrochlorothiazide 25 MG tablet Commonly known as: HYDRODIURIL Take 1 tablet (25 mg total) by mouth daily. New blood pressure medication.   Insulin Pen Needle 32G X 6 MM Misc Commonly known as: NovoFine USE AS DIRECTED 3 TIMES A DAY   Lantus SoloStar 100 UNIT/ML Solostar Pen Generic drug: Insulin Glargine INJECT 60 UNITS UNDER THE SKIN IN THE MORNING AND 50 UNITS UNDER THE SKIN IN THE EVENING   loratadine 10 MG tablet Commonly known as: CLARITIN Take 10 mg by mouth daily.   metFORMIN 1000 MG tablet Commonly known as: GLUCOPHAGE Take 1 tablet (1,000 mg total) by mouth 2 (two) times daily.   mupirocin ointment 2 % Commonly known as: BACTROBAN APPLY TO AFFECTED AREA(S) AS NEEDED   NON FORMULARY Auto CPap nightly   nystatin cream Commonly known as: MYCOSTATIN Apply topically 2 (two) times daily.   rosuvastatin 5 MG tablet Commonly known as: CRESTOR Take 1 tablet (5 mg total) by mouth daily.   sertraline 100 MG tablet Commonly known as: ZOLOFT Take 1 tablet (100 mg total) by mouth daily.   Victoza 18 MG/3ML Sopn Generic drug: liraglutide INJECT 1.8 MLS (10.8 MG TOTAL) INTO THE SKIN ONCE DAILY       Follow-up Information    Glean Hess, MD. Schedule an appointment as soon as possible for a visit in 1 week(s).   Specialty: Internal Medicine Why: check potassium level and blood pressure on new HCTZ Contact information: [address] [place] [address] [phone]           Allergies  Allergen Reactions  . Metronidazole And Related Nausea Only    Mainly oral    . Sulfa Antibiotics Rash     The results of significant diagnostics from this hospitalization (including imaging, microbiology, ancillary and laboratory) are listed below for reference.   Consultations:   Procedures/Studies: DG Chest 1 View  Result Date: 12/06/2019 CLINICAL DATA:  Weakness EXAM: CHEST  1 VIEW COMPARISON:  11/25/2018, 07/16/2018 FINDINGS: Mild cardiomegaly without overt failure. Interstitial and patchy basilar opacity, suspect chronic change. No acute airspace disease, pleural effusion, or pneumothorax. IMPRESSION: No active disease.  Mild cardiomegaly Electronically Signed   By: Donavan Foil M.D.   On: 12/06/2019 21:17      Labs: BNP (last 3 results) No results for input(s): BNP in the last 8760 hours. Basic Metabolic Panel: Recent Labs  Lab 12/06/19 1837 12/06/19 1837 12/06/19 2105 12/06/19 2344 12/06/19 2344 12/07/19 0145 12/07/19 0145 12/07/19 0604 12/07/19 0850 12/07/19 1319 12/07/19 2043 12/08/19 0056 12/08/19 0610 12/08/19 0935  NA 135  --   --  140  --  139  --  140  --   --   --   --  140  --   K 6.7*   < >  --  6.4*   < > 6.2*   < > 5.9*   < > 6.0* 5.3* 4.8 4.6 4.6  CL 110  --   --  115*  --  113*  --  114*  --   --   --   --  112*  --   CO2 15*  --   --  18*  --  19*  --  20*  --   --   --   --  20*  --   GLUCOSE 96  --   --  99  --  98  --  212*  --   --   --   --  134*  --   BUN 32*  --   --  32*  --  32*  --  30*  --   --   --   --  29*  --   CREATININE 0.83  --   --  0.77  --  0.91  --  0.91  --   --   --   --  0.82  --   CALCIUM 9.4  --   --  9.0  --  9.4  --  9.6  --   --   --   --  9.0  --   MG  --   --  2.1  --   --   --   --   --   --   --   --   --  1.8  --    < > = values in this interval not displayed.   Liver Function Tests: No results for input(s): AST, ALT, ALKPHOS, BILITOT, PROT, ALBUMIN in the last 168 hours. No results for input(s): LIPASE, AMYLASE in the last 168 hours. No results for input(s): AMMONIA in the last 168  hours. CBC: Recent Labs  Lab 12/06/19 1837 12/07/19 0145 12/07/19 0604 12/08/19 0610  WBC 8.9 8.9 7.7 7.3  HGB 11.7* 10.8* 11.2* 11.1*  HCT 36.9 33.9* 35.2* 35.1*  MCV 88.7 88.7 89.3 89.1  PLT 199 175 168 175   Cardiac Enzymes: Recent Labs  Lab 12/06/19 2105 12/07/19 0604  CKTOTAL 564* 470*   BNP: Invalid input(s): POCBNP CBG: Recent Labs  Lab 12/07/19 1221 12/07/19 1647 12/07/19 2137 12/08/19 0742  GLUCAP 135* 140* 123* 130*   D-Dimer No results for input(s): DDIMER in the last 72 hours. Hgb A1c Recent Labs    12/07/19 0850  HGBA1C 6.1*   Lipid Profile No results for input(s): CHOL, HDL, LDLCALC, TRIG, CHOLHDL, LDLDIRECT in the last 72 hours. Thyroid function studies No results for input(s): TSH, T4TOTAL, T3FREE, THYROIDAB in the last 72 hours.  Invalid input(s): FREET3 Anemia work up No results for input(s): VITAMINB12, FOLATE, FERRITIN, TIBC, IRON, RETICCTPCT in the last 72 hours. Urinalysis    Component Value Date/Time   COLORURINE STRAW (A) 12/06/2019 2105   APPEARANCEUR CLEAR (A) 12/06/2019 2105   APPEARANCEUR Clear 08/08/2013 0055   LABSPEC 1.006 12/06/2019 2105   LABSPEC 1.010 08/08/2013 0055   PHURINE 5.0 12/06/2019 2105   GLUCOSEU NEGATIVE 12/06/2019 2105   GLUCOSEU Negative 08/08/2013 0055   HGBUR NEGATIVE 12/06/2019 2105   [place] NEGATIVE 12/06/2019 2105   BILIRUBINUR neg 04/13/2018 1124   BILIRUBINUR Negative 08/08/2013 Cut Off 12/06/2019 2105   PROTEINUR NEGATIVE 12/06/2019 2105   UROBILINOGEN 0.2 04/13/2018 1124   NITRITE NEGATIVE  12/06/2019 2105   LEUKOCYTESUR NEGATIVE 12/06/2019 2105   LEUKOCYTESUR Negative 08/08/2013 0055   Sepsis Labs Invalid input(s): PROCALCITONIN,  WBC,  LACTICIDVEN Microbiology Recent Results (from the past 240 hour(s))  SARS CORONAVIRUS 2 (TAT 6-24 HRS) Nasopharyngeal Nasopharyngeal Swab     Status: None   Collection Time: 12/06/19  9:05 PM   Specimen: Nasopharyngeal Swab    Result Value Ref Range Status   SARS Coronavirus 2 NEGATIVE NEGATIVE Final    Comment: (NOTE) SARS-CoV-2 target nucleic acids are NOT DETECTED. The SARS-CoV-2 RNA is generally detectable in upper and lower respiratory specimens during the acute phase of infection. Negative results do not preclude SARS-CoV-2 infection, do not rule out co-infections with other pathogens, and should not be used as the sole basis for treatment or other patient management decisions. Negative results must be combined with clinical observations, patient history, and epidemiological information. The expected result is Negative. Fact Sheet for Patients: SugarRoll.be Fact Sheet for Healthcare Providers: https://www.woods-mathews.com/ This test is not yet approved or cleared by the [place] FDA and  has been authorized for detection and/or diagnosis of SARS-CoV-2 by FDA under an Emergency Use Authorization (EUA). This EUA will remain  in effect (meaning this test can be used) for the duration of the COVID-19 declaration under Section 56 4(b)(1) of the Act, 21 U.S.C. section 360bbb-3(b)(1), unless the authorization is terminated or revoked sooner. Performed at [facility], [place] [address], [place], [place] [address]   Blood culture (routine x 2)     Status: None (Preliminary result)   Collection Time: 12/06/19  9:05 PM   Specimen: BLOOD RIGHT FOREARM  Result Value Ref Range Status   Specimen Description BLOOD RIGHT FOREARM  Final   Special Requests   Final    Blood Culture results may not be optimal due to an excessive volume of blood received in culture bottles   Culture   Final    NO GROWTH 2 DAYS Performed at [facility], [address] [place], [place] [address]    Report Status PENDING  Incomplete  Blood culture (routine x 2)     Status: None (Preliminary result)   Collection Time: 12/06/19  9:06 PM   Specimen: Left Antecubital; Blood   Result Value Ref Range Status   Specimen Description LEFT ANTECUBITAL  Final   Special Requests   Final    Blood Culture results may not be optimal due to an excessive volume of blood received in culture bottles   Culture   Final    NO GROWTH 2 DAYS Performed at [facility], [address] [place], [place] [address]    Report Status PENDING  Incomplete     Total time spend on discharging this patient, including the last patient exam, discussing the hospital stay, instructions for ongoing care as it relates to all pertinent caregivers, as well as preparing the medical discharge records, prescriptions, and/or referrals as applicable, is 40 minutes.    Enzo Bi, MD  [facility] 12/08/2019, 10:15 AM  If 7PM-7AM, please contact night-coverage

## 2019-12-08 NOTE — Evaluation (Signed)
Physical Therapy Evaluation Patient Details Name: Maria Singh MRN: [id number] DOB: 03-23-1951 Today's Date: 12/08/2019   History of Present Illness  Per MD notes: Pt is a 69 y.o. 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  MD assessment includes: Hyperkalemia, generalized weakness, acute nontraumatic rhabdomyolysis, OSA on CPAP, Type II diabetes mellitus with complication/neuropathy, and morbid obesity.    Clinical Impression  Pt pleasant and motivated to participate during the session.  Pt required Mod A with sup to sit but put forth very good effort.  Pt required extensive time and effort during lateral scoot from the bed to her w/c with cues for sequencing.  Multiple attempts made to stand from various height surfaces and both with and without the use of a grab bar with pt ultimately unable to come to standing regardless of strategy used.  Pt advised on benefit of time in a SNF setting prior to discharge home to increase functional strength prior to returning home with pt adamant that she did not want to go to rehab.  Pt does have a hoyer lift that she stated she could use if needed but her primary caregiver is currently hospitalized per patient.         Follow Up Recommendations SNF    Equipment Recommendations  None recommended by PT    Recommendations for Other Services       Precautions / Restrictions Precautions Precautions: Fall Restrictions Weight Bearing Restrictions: No      Mobility  Bed Mobility Overal bed mobility: Needs Assistance Bed Mobility: Supine to Sit     Supine to sit: Mod assist     General bed mobility comments: Mod A for BLE and trunk control  Transfers Overall transfer level:  Needs assistance Equipment used: None Transfers: Lateral/Scoot Transfers          Lateral/Scoot Transfers: Supervision General transfer comment: Pt able to laterally scoot from bed to power w/c with w/c arm elevated; pt did not require physical assist but did require cuing for sequencing and significant time and effort; pt unable to come to standing with sit to stand attempts in BR with grab bar assist  Ambulation/Gait             General Gait Details: Pt non-ambulatory  Stairs            Wheelchair Mobility    Modified Rankin (Stroke Patients Only)       Balance Overall balance assessment: No apparent balance deficits (not formally assessed)                                           Pertinent Vitals/Pain Pain Assessment: No/denies pain    Home Living Family/patient expects to be discharged to:: Private residence Living Arrangements: Children Available Help at Discharge: Family;Available 24 hours/day Type of Home: House Home Access: Ramped entrance     Home Layout: Two level;Able to live on main level with bedroom/bathroom Home Equipment: Grab bars - toilet;Shower seat;Wheelchair - power;Other (comment)(Hoyer lift) Additional Comments: Pt lives with daughter who is typically available 24/7 but per patient is currently admitted to the hospital; pt has a [employer] for community  access with her power w/c, no falls in the last year    Prior Function Level of Independence: Needs assistance   Gait / Transfers Assistance Needed: Pt non-ambulatory at baseline; pt does lateral scoot transfers from bed to/from power w/c and SPT from toilet to/from chair with sliding board use as needed  ADL's / Homemaking Assistance Needed: Daughter assists with ADLs as needed  Comments: Pt owns a hoyer lift but has not needed to use it for over a year     Hand Dominance        Extremity/Trunk Assessment   Upper Extremity Assessment Upper Extremity  Assessment: Generalized weakness    Lower Extremity Assessment Lower Extremity Assessment: Generalized weakness       Communication   Communication: HOH  Cognition Arousal/Alertness: Awake/alert Behavior During Therapy: WFL for tasks assessed/performed Overall Cognitive Status: Within Functional Limits for tasks assessed                                        General Comments      Exercises Other Exercises Other Exercises: Transfer training with multiple attempts from various surfaces and heights   Assessment/Plan    PT Assessment Patient needs continued PT services  PT Problem List Decreased strength;Decreased activity tolerance;Decreased mobility       PT Treatment Interventions DME instruction;Functional mobility training;Therapeutic activities;Therapeutic exercise;Balance training;Patient/family education    PT Goals (Current goals can be found in the Care Plan section)  Acute Rehab PT Goals Patient Stated Goal: To get out of bed and be able to transfer better PT Goal Formulation: With patient Time For Goal Achievement: 12/21/19 Potential to Achieve Goals: Fair    Frequency Min 2X/week   Barriers to discharge Decreased caregiver support      Co-evaluation               AM-PAC PT "6 Clicks" Mobility  Outcome Measure Help needed turning from your back to your side while in a flat bed without using bedrails?: A Lot Help needed moving from lying on your back to sitting on the side of a flat bed without using bedrails?: A Lot Help needed moving to and from a bed to a chair (including a wheelchair)?: A Little Help needed standing up from a chair using your arms (e.g., wheelchair or bedside chair)?: Total Help needed to walk in hospital room?: Total Help needed climbing 3-5 steps with a railing? : Total 6 Click Score: 10    End of Session   Activity Tolerance: Patient tolerated treatment well Patient left: in chair;Other (comment);with  call bell/phone within reach(Pt left in power chair, nursing notified) Nurse Communication: Mobility status PT Visit Diagnosis: Muscle weakness (generalized) (M62.81)    Time: [id number] PT Time Calculation (min) (ACUTE ONLY): 48 min   Charges:   PT Evaluation $PT Eval Moderate Complexity: 1 Mod PT Treatments $Therapeutic Activity: 8-22 mins        D. Royetta Asal PT, DPT 12/08/19, 12:44 PM

## 2019-12-09 DIAGNOSIS — M6282 Rhabdomyolysis: Secondary | ICD-10-CM | POA: Diagnosis not present

## 2019-12-11 ENCOUNTER — Ambulatory Visit: Payer: Self-pay | Admitting: General Practice

## 2019-12-11 LAB — CULTURE, BLOOD (ROUTINE X 2)
Culture: NO GROWTH
Culture: NO GROWTH

## 2019-12-11 NOTE — Chronic Care Management (AMB) (Signed)
  Chronic Care Management   Outreach Note  12/11/2019 Name: Maria Singh MRN: [id number] DOB: 30-May-1951  Referred by: Glean Hess, MD Reason for referral : Chronic Care Management (Post discharge Assessment- discharged on 12-08-2019/ Hyperkalemia)   An unsuccessful telephone outreach was attempted today. The patient was referred to the case management team for assistance with care management and care coordination.   Follow Up Plan: A HIPPA compliant phone message was left for the patient providing contact information and requesting a return call.   Noreene Larsson RN, MSN, [facility] Mobile: [phone]

## 2019-12-14 DIAGNOSIS — F4320 Adjustment disorder, unspecified: Secondary | ICD-10-CM | POA: Diagnosis not present

## 2019-12-25 ENCOUNTER — Other Ambulatory Visit: Payer: Self-pay

## 2019-12-25 ENCOUNTER — Encounter: Payer: Self-pay | Admitting: Internal Medicine

## 2019-12-25 ENCOUNTER — Ambulatory Visit (INDEPENDENT_AMBULATORY_CARE_PROVIDER_SITE_OTHER): Payer: 59 | Admitting: Internal Medicine

## 2019-12-25 VITALS — BP 180/82 | HR 72

## 2019-12-25 DIAGNOSIS — H9193 Unspecified hearing loss, bilateral: Secondary | ICD-10-CM | POA: Diagnosis not present

## 2019-12-25 DIAGNOSIS — I10 Essential (primary) hypertension: Secondary | ICD-10-CM

## 2019-12-25 DIAGNOSIS — E875 Hyperkalemia: Secondary | ICD-10-CM | POA: Diagnosis not present

## 2019-12-25 DIAGNOSIS — M6282 Rhabdomyolysis: Secondary | ICD-10-CM | POA: Diagnosis not present

## 2019-12-25 DIAGNOSIS — E118 Type 2 diabetes mellitus with unspecified complications: Secondary | ICD-10-CM

## 2019-12-25 NOTE — Progress Notes (Signed)
Date:  12/25/2019   Name:  Maria Singh   DOB:  07/23/1951   MRN:  [id number]   Chief Complaint: Follow-up (HTN/potassium ) Hospital follow up.  Admitted to [facility]  12/06/19 to 12/08/19.  Received a TOC call on 12/08/19 through [facility] chronic care management services?  Hyperkalemia Generalized weakness Acute nontraumatic rhabdomyolysis Etiology of hyperkalemia uncertain, but likely due to excess intake in potassium-rich foods. Renal function normal.  Patient did state that her daughter was hospitalized with low potassium and they had been eating a high potassium diet (pt was snacking on a lot of dried apricots).  Pt received temporizing agents with Insulin and dextrose, and Veltassa and Kayexalate x2.  Pt had several BM's and K+ finally normalized on the day of discharge.  Nephrology was consulted who recommended avoiding ACEi and ARB.    Hyperkalemia - has not been rechecked since discharge.  She has discontinued eating high potassium foods.  ACE and ARB are to be avoided, although she should be on one for renal protection due to diabetic proteinuria. Was previously on low dose losartan 25 mg.  Nephrology suspects type IV RTA and will see her for follow up.  Rhabdomyolysis and weakness - she is now back to baseline.  Using wheelchair and transferring with some assistance.   She feels well.  No new complaints.  Hypertension This is a chronic problem. The problem is unchanged. The problem is uncontrolled. Pertinent negatives include no chest pain, headaches, palpitations or shortness of breath. Past treatments include angiotensin blockers (losartan 25 mg previously; now only on hctz). The current treatment provides mild improvement.  Diabetes She presents for her follow-up diabetic visit. She has type 2 diabetes mellitus. Her disease course has been stable. Pertinent negatives for hypoglycemia include no dizziness or headaches. Pertinent negatives for diabetes include no chest pain. Current  diabetic treatment includes intensive insulin program (Victoza and metformin). She is compliant with treatment all of the time. An ACE inhibitor/angiotensin II receptor blocker is contraindicated.   Immunization History  Administered Date(s) Administered  . Influenza-Unspecified 09/16/2015, 10/18/2017, 08/01/2019  . Moderna SARS-COVID-2 Vaccination 11/23/2019, 12/21/2019  . Pneumococcal Conjugate-13 04/13/2018  . Pneumococcal Polysaccharide-23 10/25/2014  . Td 10/19/2005  . Zoster 10/20/2011    Lab Results  Component Value Date   CREATININE 0.82 12/08/2019   BUN 29 (H) 12/08/2019   NA 140 12/08/2019   K 4.7 12/08/2019   CL 112 (H) 12/08/2019   CO2 20 (L) 12/08/2019   Lab Results  Component Value Date   CHOL 152 05/17/2019   HDL 26 (L) 05/17/2019   LDLCALC 69 05/17/2019   TRIG 286 (H) 05/17/2019   CHOLHDL 5.8 (H) 05/17/2019   Lab Results  Component Value Date   TSH 5.990 (H) 05/17/2019   Lab Results  Component Value Date   HGBA1C 6.1 (H) 12/07/2019     Review of Systems  Constitutional: Negative for chills and fever.  HENT: Positive for hearing loss and voice change. Negative for trouble swallowing.   Respiratory: Negative for cough, chest tightness, shortness of breath and wheezing.   Cardiovascular: Positive for leg swelling. Negative for chest pain and palpitations.  Gastrointestinal: Negative for abdominal pain and blood in stool.  Genitourinary: Negative for dysuria.  Musculoskeletal: Positive for gait problem (unchanged; depends on electic WC).  Neurological: Negative for dizziness and headaches.  Psychiatric/Behavioral: Negative for dysphoric mood and sleep disturbance.    Patient Active Problem List   Diagnosis Date Noted  . Rhabdomyolysis  12/06/2019  . Hyperkalemia 12/06/2019  . Morbid obesity with BMI of 50.0-59.9, adult ([place]) 12/06/2019  . Ambulatory dysfunction 12/06/2019  . Type II diabetes mellitus with complication ([place]) [id number]  . Iron  deficiency anemia 12/13/2018  . OSA on CPAP 11/25/2018  . GERD (gastroesophageal reflux disease) 04/13/2018  . Hyperlipidemia associated with type 2 diabetes mellitus ([place]) 08/10/2017  . Essential hypertension 08/10/2017  . 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 Pain in limb 10/30/2016  . Swelling of limb 10/30/2016    Allergies  Allergen Reactions  . Metronidazole And Related Nausea Only    Mainly oral  . Sulfa Antibiotics Rash    Past Surgical History:  Procedure Laterality Date  . arm surgery Left    broken arm  . HERNIA REPAIR      Social History   Tobacco Use  . Smoking status: Never Smoker  . Smokeless tobacco: Never Used  Substance Use Topics  . Alcohol use: Yes    Alcohol/week: 0.0 standard drinks    Comment: very very rare  . Drug use: No     Medication list has been reviewed and updated.  Current Meds  Medication Sig  . Accu-Chek FastClix Lancets MISC TEST ONCE DAILY  . ACCU-CHEK GUIDE test strip CHECK BLOOD SUGAR TWO TIMES DAILY  . acetaminophen (TYLENOL) 500 MG tablet Take 500 mg by mouth every 6 (six) hours as needed.  Marland Kitchen aspirin 81 MG tablet Take 81 mg by mouth daily.  Marland Kitchen docusate sodium (COLACE) 100 MG capsule Take 100 mg by mouth daily.   . ferrous sulfate 325 (65 FE) MG tablet Take 1 tablet (325 mg total) by mouth daily with breakfast.  . HUMALOG KWIKPEN 100 UNIT/ML KwikPen INJECT 22 UNITS TOTAL INTO THE SKIN 3 THREE TIMES DAILY.  . hydrochlorothiazide (HYDRODIURIL) 25 MG tablet Take 1 tablet (25 mg total) by mouth daily. New blood pressure medication.  . Insulin Pen Needle (NOVOFINE) 32G X 6 MM MISC USE AS DIRECTED 3 TIMES A DAY  . LANTUS SOLOSTAR 100 UNIT/ML Solostar Pen INJECT 60 UNITS UNDER THE SKIN IN THE MORNING AND 50 UNITS UNDER THE SKIN IN THE EVENING  . liraglutide (VICTOZA) 18 MG/3ML SOPN INJECT 1.8 MLS (10.8 MG TOTAL) INTO THE SKIN ONCE DAILY  . loratadine (CLARITIN) 10 MG tablet Take 10 mg by mouth daily.  . metFORMIN  (GLUCOPHAGE) 1000 MG tablet Take 1 tablet (1,000 mg total) by mouth 2 (two) times daily.  . mupirocin ointment (BACTROBAN) 2 % APPLY TO AFFECTED AREA(S) AS NEEDED  . NON FORMULARY Auto CPap nightly  . nystatin cream (MYCOSTATIN) Apply topically 2 (two) times daily.  . rosuvastatin (CRESTOR) 5 MG tablet Take 1 tablet (5 mg total) by mouth daily.  . sertraline (ZOLOFT) 100 MG tablet Take 1 tablet (100 mg total) by mouth daily.    PHQ 2/9 Scores 12/25/2019 05/17/2019 05/17/2019 12/13/2018  PHQ - 2 Score - 0 0 0  PHQ- 9 Score - 1 - 0  Exception Documentation Medical reason - - -    BP Readings from Last 3 Encounters:  12/25/19 (!) 180/82  12/08/19 (!) 149/59  05/17/19 (!) 144/72    Physical Exam Vitals and nursing note reviewed.  Constitutional:      General: She is not in acute distress.    Appearance: She is well-developed.  HENT:     Head: Normocephalic and atraumatic.  Neck:     Vascular: No carotid bruit.  Cardiovascular:     Rate and  Rhythm: Normal rate and regular rhythm.     Pulses: Normal pulses.     Heart sounds: No murmur.  Pulmonary:     Effort: Pulmonary effort is normal. No respiratory distress.     Breath sounds: No wheezing or rhonchi.  Musculoskeletal:     Cervical back: Normal range of motion.     Right lower leg: Edema present.     Left lower leg: Edema present.  Lymphadenopathy:     Cervical: No cervical adenopathy.  Skin:    General: Skin is warm and dry.     Capillary Refill: Capillary refill takes less than 2 seconds.     Findings: No rash.  Neurological:     Mental Status: She is alert and oriented to person, place, and time. Mental status is at baseline.     Sensory: Sensory deficit present.     Motor: Weakness present.     Gait: Gait abnormal.  Psychiatric:        Behavior: Behavior normal.        Thought Content: Thought content normal.     Wt Readings from Last 3 Encounters:  12/06/19 (!) 305 lb (138.3 kg)  11/25/18 (!) 304 lb 15.8 oz  (138.3 kg)  11/23/18 (!) 305 lb (138.3 kg)    BP (!) 180/82   Pulse 72   SpO2 96%   Assessment and Plan: 1. Hyperkalemia Hopefully this has resolved Continue HCTZ Will check labs and if K on the low end of normal, will restart Losartan Hold off on Nephrology follow up at this time - Basic metabolic panel  2. Non-traumatic rhabdomyolysis Appears completely resolved - CK, TOTAL(REFL)  3. Essential hypertension Not controlled since stopping losartan Will instruct pt on resuming when labs return Recommend monitoring at home about once per week  4. Type II diabetes mellitus with complication ([place]) Has been very well controlled on current regimen Last a1C was excellent   Partially dictated using Editor, commissioning. Any errors are unintentional.  Halina Maidens, MD [facility]  12/25/2019

## 2019-12-26 ENCOUNTER — Other Ambulatory Visit: Payer: Self-pay

## 2019-12-26 ENCOUNTER — Other Ambulatory Visit: Payer: Self-pay | Admitting: Internal Medicine

## 2019-12-26 DIAGNOSIS — E875 Hyperkalemia: Secondary | ICD-10-CM

## 2019-12-26 LAB — BASIC METABOLIC PANEL
BUN/Creatinine Ratio: 35 — ABNORMAL HIGH (ref 12–28)
BUN: 29 mg/dL — ABNORMAL HIGH (ref 8–27)
CO2: 17 mmol/L — ABNORMAL LOW (ref 20–29)
Calcium: 9.3 mg/dL (ref 8.7–10.3)
Chloride: 113 mmol/L — ABNORMAL HIGH (ref 96–106)
Creatinine, Ser: 0.84 mg/dL (ref 0.57–1.00)
GFR calc Af Amer: 83 mL/min/{1.73_m2} (ref >59)
GFR calc non Af Amer: 72 mL/min/{1.73_m2} (ref >59)
Glucose: 97 mg/dL (ref 65–99)
Potassium: 6.1 mmol/L — ABNORMAL HIGH (ref 3.5–5.2)
Sodium: 144 mmol/L (ref 134–144)

## 2019-12-26 LAB — CK: Total CK: 389 U/L — ABNORMAL HIGH (ref 32–182)

## 2019-12-27 ENCOUNTER — Ambulatory Visit (INDEPENDENT_AMBULATORY_CARE_PROVIDER_SITE_OTHER): Payer: 59

## 2019-12-27 ENCOUNTER — Other Ambulatory Visit: Payer: Self-pay | Admitting: Internal Medicine

## 2019-12-27 ENCOUNTER — Ambulatory Visit: Payer: Self-pay | Admitting: General Practice

## 2019-12-27 VITALS — BP 160/76 | HR 74 | Ht 65.0 in | Wt 305.0 lb

## 2019-12-27 DIAGNOSIS — R262 Difficulty in walking, not elsewhere classified: Secondary | ICD-10-CM

## 2019-12-27 DIAGNOSIS — Z0000 Encounter for general adult medical examination without abnormal findings: Secondary | ICD-10-CM

## 2019-12-27 DIAGNOSIS — E118 Type 2 diabetes mellitus with unspecified complications: Secondary | ICD-10-CM

## 2019-12-27 DIAGNOSIS — E782 Mixed hyperlipidemia: Secondary | ICD-10-CM

## 2019-12-27 DIAGNOSIS — M6282 Rhabdomyolysis: Secondary | ICD-10-CM

## 2019-12-27 DIAGNOSIS — I10 Essential (primary) hypertension: Secondary | ICD-10-CM

## 2019-12-27 MED ORDER — AMLODIPINE BESYLATE 5 MG PO TABS: 5.0000 mg | ORAL_TABLET | Freq: Every day | ORAL | 3 refills | Status: DC

## 2019-12-27 NOTE — Progress Notes (Signed)
Subjective:   Maria Singh is a 69 y.o. female who presents for an Initial Medicare Annual Wellness Visit.  Virtual Visit via Telephone Note  I connected with [name] on 12/27/19 at 11:20 AM EST by telephone and verified that I am speaking with the correct person using two identifiers.  Medicare Annual Wellness visit completed telephonically due to Covid-19 pandemic.   Location: Patient: home Provider: office   I discussed the limitations, risks, security and privacy concerns of performing an evaluation and management service by telephone and the availability of in person appointments. The patient expressed understanding and agreed to proceed.  Some vital signs may be absent or patient reported.   Clemetine Marker, LPN    Review of Systems      Cardiac Risk Factors include: advanced age (>29men, >72 women);diabetes mellitus;dyslipidemia;hypertension;obesity (BMI >30kg/m2);sedentary lifestyle     Objective:    Today's Vitals   12/27/19 1125  BP: (!) 160/76  Pulse: 74  Weight: (!) 305 lb (138.3 kg)  Height: 5\' 5"  (1.651 m)   Body mass index is 50.75 kg/m.  Advanced Directives 12/27/2019 12/07/2019 12/06/2019 11/25/2018 11/25/2018 07/16/2018 07/16/2018  Does Patient Have a Medical Advance Directive? Yes Yes No Yes No Yes Yes  Type of Paramedic of [place];Living will [place];Living will - Healthcare Power of [place]  Does patient want to make changes to medical advance directive? - No - Patient declined - No - Patient declined - No - Patient declined No - Patient declined  Copy of [place] in Chart? No - copy requested - - No - copy requested - No - copy requested -  Would patient like information on creating a medical advance directive? - No - Patient declined - No - Patient declined No - Patient declined No - Patient declined No - Patient  declined    Current Medications (verified) Outpatient Encounter Medications as of 12/27/2019  Medication Sig  . Accu-Chek FastClix Lancets MISC TEST ONCE DAILY  . ACCU-CHEK GUIDE test strip CHECK BLOOD SUGAR TWO TIMES DAILY  . acetaminophen (TYLENOL) 500 MG tablet Take 500 mg by mouth every 6 (six) hours as needed.  Marland Kitchen aspirin 81 MG tablet Take 81 mg by mouth daily.  . clotrimazole-betamethasone (LOTRISONE) cream Apply 1 application topically 2 (two) times daily.  Marland Kitchen docusate sodium (COLACE) 100 MG capsule Take 100 mg by mouth daily.   . ferrous sulfate 325 (65 FE) MG tablet Take 1 tablet (325 mg total) by mouth daily with breakfast.  . HUMALOG KWIKPEN 100 UNIT/ML KwikPen INJECT 22 UNITS TOTAL INTO THE SKIN 3 THREE TIMES DAILY.  . hydrochlorothiazide (HYDRODIURIL) 25 MG tablet Take 1 tablet (25 mg total) by mouth daily. New blood pressure medication.  . Insulin Pen Needle (NOVOFINE) 32G X 6 MM MISC USE AS DIRECTED 3 TIMES A DAY  . LANTUS SOLOSTAR 100 UNIT/ML Solostar Pen INJECT 60 UNITS UNDER THE SKIN IN THE MORNING AND 50 UNITS UNDER THE SKIN IN THE EVENING  . liraglutide (VICTOZA) 18 MG/3ML SOPN INJECT 1.8 MLS (10.8 MG TOTAL) INTO THE SKIN ONCE DAILY  . loratadine (CLARITIN) 10 MG tablet Take 10 mg by mouth daily.  . mupirocin ointment (BACTROBAN) 2 % APPLY TO AFFECTED AREA(S) AS NEEDED  . NON FORMULARY Auto CPap nightly  . nystatin cream (MYCOSTATIN) Apply topically 2 (two) times daily.  . rosuvastatin (CRESTOR) 5 MG tablet Take 1 tablet (  5 mg total) by mouth daily.  . sertraline (ZOLOFT) 100 MG tablet Take 1 tablet (100 mg total) by mouth daily.  . metFORMIN (GLUCOPHAGE) 1000 MG tablet Take 1 tablet (1,000 mg total) by mouth 2 (two) times daily.   No facility-administered encounter medications on file as of 12/27/2019.    Allergies (verified) Metronidazole and related and Sulfa antibiotics   History: Past Medical History:  Diagnosis Date  . 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  Depression   . Diabetes mellitus without complication ([place])   . GERD (gastroesophageal reflux disease)   . Hyperlipidemia   . Hypertension   . Rotator cuff injury Nov. 2012   left arm  . Sepsis ([place]) 07/16/2018  . Sepsis ([place]) 07/16/2018  . Sleep apnea    Past Surgical History:  Procedure Laterality Date  . arm surgery Left    broken arm  . HERNIA REPAIR     Family History  Problem Relation Age of Onset  . Cancer Mother   . Heart disease Father   . Diabetes Maternal Grandmother   . Heart disease Paternal Grandfather   . Breast cancer Maternal Aunt        mat great aunt   Social History   Socioeconomic History  . Marital status: Married    Spouse name: Not on file  . Number of children: Not on file  . Years of education: Not on file  . Highest education level: Not on file  Occupational History  . Not on file  Tobacco Use  . Smoking status: Never Smoker  . Smokeless tobacco: Never Used  Substance and Sexual Activity  . Alcohol use: Yes    Alcohol/week: 0.0 standard drinks    Comment: very very rare  . Drug use: No  . Sexual activity: Never  Other Topics Concern  . Not on file  Social History Narrative  . Not on file   Social Determinants of Health   Financial Resource Strain: Low Risk   . Difficulty of Paying Living Expenses: Not hard at all  Food Insecurity: No Food Insecurity  . Worried About Charity fundraiser in the Last Year: Never true  . Ran Out of Food in the Last Year: Never true  Transportation Needs: No Transportation Needs  . Lack of Transportation (Medical): No  . Lack of Transportation (Non-Medical): No  Physical Activity: Inactive  . Days of Exercise per Week: 0 days  . Minutes of Exercise per Session: 0 min  Stress: No Stress Concern Present  . Feeling of Stress : Not at all  Social Connections: Unknown  . Frequency of Communication with Friends and Family: Patient refused  . Frequency of Social Gatherings with Friends and Family:  Patient refused  . Attends Religious Services: Patient refused  . Active Member of Clubs or Organizations: Patient refused  . Attends Archivist Meetings: Patient refused  . Marital Status: Married    Tobacco Counseling Counseling given: Not Answered   Clinical Intake:  Pre-visit preparation completed: Yes  Pain : No/denies pain     BMI - recorded: 50.75 Nutritional Status: BMI > 30  Obese Nutritional Risks: None Diabetes: Yes CBG done?: No Did pt. bring in CBG monitor from home?: No   Nutrition Risk Assessment:  Has the patient had any N/V/D within the last 2 months?  No  Does the patient have any non-healing wounds?  No  Has the patient had any unintentional weight loss or weight gain?  No  Diabetes:  Is the patient diabetic?  Yes  If diabetic, was a CBG obtained today?  No  Did the patient bring in their glucometer from home?  No  How often do you monitor your CBG's? daily.   Financial Strains and Diabetes Management:  Are you having any financial strains with the device, your supplies or your medication? No .  Does the patient want to be seen by Chronic Care Management for management of their diabetes?  No  Would the patient like to be referred to a Nutritionist or for Diabetic Management?     Diabetic Exams:  Diabetic Eye Exam: Completed 11/07/19 negative retinopathy.   Diabetic Foot Exam: Completed 05/17/19.   How often do you need to have someone help you when you read instructions, pamphlets, or other written materials from your doctor or pharmacy?: 1 - Never  Interpreter Needed?: No  Information entered by :: Clemetine Marker LPN   Activities of Daily Living In your present state of health, do you have any difficulty performing the following activities: 12/27/2019 12/07/2019  Hearing? Y N  Comment plans to see ENT for hearing aids -  Vision? N N  Difficulty concentrating or making decisions? N N  Walking or climbing stairs? Y Y  Dressing or  bathing? Y Y  Doing errands, shopping? [place] Donning  Preparing Food and eating ? N -  Using the Toilet? N -  In the past six months, have you accidently leaked urine? Y -  Comment wears pads/depends or uses underpads for protection at times -  Do you have problems with loss of bowel control? N -  Managing your Medications? N -  Managing your Finances? N -  Housekeeping or managing your Housekeeping? N -  Some recent data might be hidden     Immunizations and Health Maintenance Immunization History  Administered Date(s) Administered  . Influenza-Unspecified 09/16/2015, 10/18/2017, 08/01/2019  . Moderna SARS-COVID-2 Vaccination 11/23/2019, 12/21/2019  . Pneumococcal Conjugate-13 04/13/2018  . Pneumococcal Polysaccharide-23 10/25/2014  . Td 10/19/2005  . Zoster 10/20/2011   Health Maintenance Due  Topic Date Due  . URINE MICROALBUMIN  08/10/2018  . PNA vac Low Risk Adult (2 of 2 - PPSV23) 10/26/2019    Patient Care Team: Glean Hess, MD as PCP - General (Internal Medicine) Kem Parkinson, MD (Ophthalmology) Vanita Ingles, RN as Case Manager (General Practice)  Indicate any recent Medical Services you may have received from other than [facility] providers in the past year (date may be approximate).     Assessment:   This is a routine wellness examination for Maria Singh.  Hearing/Vision screen  Hearing Screening   125Hz  250Hz  500Hz  1000Hz  2000Hz  3000Hz  4000Hz  6000Hz  8000Hz   Right ear:           Left ear:           Comments: Pt c/o poor hearing, previously had hearing aids that did not work well; plans to go to ENT for reevaluation.   Vision Screening Comments: [facility] screen[facility]  Dietary issues and exercise activities discussed: Current Exercise Habits: The patient does not participate in regular exercise at present, Exercise limited by: orthopedic condition(s);neurologic condition(s)  Goals   None    Depression Screen PHQ 2/9 Scores  12/27/2019 12/25/2019 05/17/2019 05/17/2019 12/13/2018 12/13/2018 07/26/2018  PHQ - 2 Score 0 - 0 0 0 0 0  PHQ- 9 Score - - 1 - 0 - 0  Exception Documentation - Medical reason - - - - -  Fall Risk Fall Risk  12/27/2019 12/13/2018 04/13/2018 08/05/2016 07/01/2016  Falls in the past year? 0 0 No No No  Number falls in past yr: 0 0 - - -  Injury with Fall? 0 0 - - -  Risk for fall due to : Impaired mobility;Impaired balance/gait - - Impaired balance/gait;Impaired mobility;Medication side effect Impaired balance/gait;Impaired mobility;Medication side effect  Follow up Falls prevention discussed Falls evaluation completed - - -    FALL RISK PREVENTION PERTAINING TO THE HOME:  Any stairs in or around the home? Yes  If so, do they handrails? No  has ramp outside  Home free of loose throw rugs in walkways, pet beds, electrical cords, etc? Yes  Adequate lighting in your home to reduce risk of falls? Yes   ASSISTIVE DEVICES UTILIZED TO PREVENT FALLS:  Life alert? No  Use of a cane, walker or w/c? Yes  Grab bars in the bathroom? Yes  Shower chair or bench in shower? Yes  Elevated toilet seat or a handicapped toilet? Yes   DME ORDERS:  DME order needed?  No   TIMED UP AND GO:  Was the test performed? No . Telephonic visit.   Education: Fall risk prevention has been discussed.  Intervention(s) required? No   Cognitive Function:     6CIT Screen 12/27/2019  What Year? 0 points  What month? 0 points  What time? 0 points  Count back from 20 0 points  Months in reverse 0 points  Repeat phrase 0 points  Total Score 0    Screening Tests Health Maintenance  Topic Date Due  . URINE MICROALBUMIN  08/10/2018  . PNA vac Low Risk Adult (2 of 2 - PPSV23) 10/26/2019  . COLONOSCOPY  12/24/2020 (Originally 04/27/2002)  . TETANUS/TDAP  12/24/2020 (Originally 10/20/2015)  . MAMMOGRAM  05/04/2020  . FOOT EXAM  05/16/2020  . HEMOGLOBIN A1C  06/05/2020  . OPHTHALMOLOGY EXAM  11/06/2020  . INFLUENZA  VACCINE  Completed  . DEXA SCAN  Completed  . Hepatitis C Screening  Completed    Qualifies for Shingles Vaccine? Yes  Zostavax completed 2013. Due for Shingrix. Education has been provided regarding the importance of this vaccine. Pt has been advised to call insurance company to determine out of pocket expense. Advised may also receive vaccine at local pharmacy or Health Dept. Verbalized acceptance and understanding.  Tdap: Although this vaccine is not a covered service during a Wellness Exam, does the patient still wish to receive this vaccine today?  No .  Education has been provided regarding the importance of this vaccine. Advised may receive this vaccine at local pharmacy or Health Dept. Aware to provide a copy of the vaccination record if obtained from local pharmacy or Health Dept. Verbalized acceptance and understanding.  Flu Vaccine: Up to date  Pneumococcal Vaccine: Up to date  Cancer Screenings:  Colorectal Screening: Not completed. Pt declines at this time.   Mammogram: Completed 05/04/18. Repeat every year. Ordered 05/17/19. Pt provided with contact information and advised to call to schedule appt.   Bone Density: Completed 05/04/18. Results reflect NORMAL. Repeat every 2 years.   Lung Cancer Screening: (Low Dose CT Chest recommended if Age 68-80 years, 30 pack-year currently smoking OR have quit w/in 15years.) does not qualify.   Additional Screening:  Hepatitis C Screening: does qualify; Completed 08/10/17  Vision Screening: Recommended annual ophthalmology exams for early detection of glaucoma and other disorders of the eye. Is the patient up to date with their annual eye  exam?  Yes  Who is the provider or what is the name of the office in which the pt attends annual eye exams? [place] Screening: Recommended annual dental exams for proper oral hygiene  Community Resource Referral:  CRR required this visit?  No      Plan:    I have personally  reviewed and addressed the Medicare Annual Wellness questionnaire and have noted the following in the patient's chart:  A. Medical and social history B. Use of alcohol, tobacco or illicit drugs  C. Current medications and supplements D. Functional ability and status E.  Nutritional status F.  Physical activity G. Advance directives H. List of other physicians I.  Hospitalizations, surgeries, and ER visits in previous 12 months J.  [place] such as hearing and vision if needed, cognitive and depression L. Referrals and appointments   In addition, I have reviewed and discussed with patient certain preventive protocols, quality metrics, and best practice recommendations. A written personalized care plan for preventive services as well as general preventive health recommendations were provided to patient.   Signed,  Clemetine Marker, LPN Nurse Health Advisor   Nurse Notes: pt recently d/c from hospital and feeling better. Pt states she was advised by Dr. Army Melia that amlodipine would be called in to pharmacy to start instead of losartan due to elevated potassium. Confirmed referral placed yesterday to Dr. Holley Raring.   Patient also states she is in need of a new power wheel chair. Message sent to Dellie Catholic RN with CCM team to assist in coordination of care and determine available DME suppliers and if Medicare is able to assist with covering cost for the chair.

## 2019-12-27 NOTE — Patient Instructions (Signed)
Maria Singh , Thank you for taking time to come for your Medicare Wellness Visit. I appreciate your ongoing commitment to your health goals. Please review the following plan we discussed and let me know if I can assist you in the future.   Screening recommendations/referrals: Colonoscopy: not completed Mammogram: done 05/04/18 Bone Density: done 05/04/18 Recommended yearly ophthalmology/optometry visit for glaucoma screening and checkup Recommended yearly dental visit for hygiene and checkup  Vaccinations: Influenza vaccine: done 08/01/19 Pneumococcal vaccine: done 04/13/18 Tdap vaccine: done 2007 - due Shingles vaccine: Shingrix discussed. Please contact your pharmacy for coverage information.  Covid-19: done 11/23/19 & 12/21/19   Advanced directives: Please bring a copy of your health care power of attorney and living will to the office at your convenience.  Conditions/risks identified: Recommend increasing physical activity. See attached chair exercises.  Next appointment: Please follow up in one year for your Medicare Annual Wellness visit.     Preventive Care 68 Years and Older, Female Preventive care refers to lifestyle choices and visits with your health care provider that can promote health and wellness. What does preventive care include?  A yearly physical exam. This is also called an annual well check.  Dental exams once or twice a year.  Routine eye exams. Ask your health care provider how often you should have your eyes checked.  Personal lifestyle choices, including:  Daily care of your teeth and gums.  Regular physical activity.  Eating a healthy diet.  Avoiding tobacco and drug use.  Limiting alcohol use.  Practicing safe sex.  Taking low-dose aspirin every day.  Taking vitamin and mineral supplements as recommended by your health care provider. What happens during an annual well check? The services and screenings done by your health care provider during  your annual well check will depend on your age, overall health, lifestyle risk factors, and family history of disease. Counseling  Your health care provider may ask you questions about your:  Alcohol use.  Tobacco use.  Drug use.  Emotional well-being.  Home and relationship well-being.  Sexual activity.  Eating habits.  History of falls.  Memory and ability to understand (cognition).  Work and work Statistician.  Reproductive health. Screening  You may have the following tests or measurements:  Height, weight, and BMI.  Blood pressure.  Lipid and cholesterol levels. These may be checked every 5 years, or more frequently if you are over 35 years old.  Skin check.  Lung cancer screening. You may have this screening every year starting at age 55 if you have a 30-pack-year history of smoking and currently smoke or have quit within the past 15 years.  Fecal occult blood test (FOBT) of the stool. You may have this test every year starting at age 74.  Flexible sigmoidoscopy or colonoscopy. You may have a sigmoidoscopy every 5 years or a colonoscopy every 10 years starting at age 62.  Hepatitis C blood test.  Hepatitis B blood test.  Sexually transmitted disease (STD) testing.  Diabetes screening. This is done by checking your blood sugar (glucose) after you have not eaten for a while (fasting). You may have this done every 1-3 years.  Bone density scan. This is done to screen for osteoporosis. You may have this done starting at age 49.  Mammogram. This may be done every 1-2 years. Talk to your health care provider about how often you should have regular mammograms. Talk with your health care provider about your test results, treatment options, and if necessary,  the need for more tests. Vaccines  Your health care provider may recommend certain vaccines, such as:  Influenza vaccine. This is recommended every year.  Tetanus, diphtheria, and acellular pertussis (Tdap,  Td) vaccine. You may need a Td booster every 10 years.  Zoster vaccine. You may need this after age 82.  Pneumococcal 13-valent conjugate (PCV13) vaccine. One dose is recommended after age 71.  Pneumococcal polysaccharide (PPSV23) vaccine. One dose is recommended after age 7. Talk to your health care provider about which screenings and vaccines you need and how often you need them. This information is not intended to replace advice given to you by your health care provider. Make sure you discuss any questions you have with your health care provider. Document Released: 11/01/2015 Document Revised: 06/24/2016 Document Reviewed: 08/06/2015 Elsevier Interactive Patient Education  2017 [employer] Prevention in the Home Falls can cause injuries. They can happen to people of all ages. There are many things you can do to make your home safe and to help prevent falls. What can I do on the outside of my home?  Regularly fix the edges of walkways and driveways and fix any cracks.  Remove anything that might make you trip as you walk through a door, such as a raised step or threshold.  Trim any bushes or trees on the path to your home.  Use bright outdoor lighting.  Clear any walking paths of anything that might make someone trip, such as rocks or tools.  Regularly check to see if handrails are loose or broken. Make sure that both sides of any steps have handrails.  Any raised decks and porches should have guardrails on the edges.  Have any leaves, snow, or ice cleared regularly.  Use sand or salt on walking paths during winter.  Clean up any spills in your garage right away. This includes oil or grease spills. What can I do in the bathroom?  Use night lights.  Install grab bars by the toilet and in the tub and shower. Do not use towel bars as grab bars.  Use non-skid mats or decals in the tub or shower.  If you need to sit down in the shower, use a plastic, non-slip  stool.  Keep the floor dry. Clean up any water that spills on the floor as soon as it happens.  Remove soap buildup in the tub or shower regularly.  Attach bath mats securely with double-sided non-slip rug tape.  Do not have throw rugs and other things on the floor that can make you trip. What can I do in the bedroom?  Use night lights.  Make sure that you have a light by your bed that is easy to reach.  Do not use any sheets or blankets that are too big for your bed. They should not hang down onto the floor.  Have a firm chair that has side arms. You can use this for support while you get dressed.  Do not have throw rugs and other things on the floor that can make you trip. What can I do in the kitchen?  Clean up any spills right away.  Avoid walking on wet floors.  Keep items that you use a lot in easy-to-reach places.  If you need to reach something above you, use a strong step stool that has a grab bar.  Keep electrical cords out of the way.  Do not use floor polish or wax that makes floors slippery. If you must use  wax, use non-skid floor wax.  Do not have throw rugs and other things on the floor that can make you trip. What can I do with my stairs?  Do not leave any items on the stairs.  Make sure that there are handrails on both sides of the stairs and use them. Fix handrails that are broken or loose. Make sure that handrails are as long as the stairways.  Check any carpeting to make sure that it is firmly attached to the stairs. Fix any carpet that is loose or worn.  Avoid having throw rugs at the top or bottom of the stairs. If you do have throw rugs, attach them to the floor with carpet tape.  Make sure that you have a light switch at the top of the stairs and the bottom of the stairs. If you do not have them, ask someone to add them for you. What else can I do to help prevent falls?  Wear shoes that:  Do not have high heels.  Have rubber bottoms.  Are  comfortable and fit you well.  Are closed at the toe. Do not wear sandals.  If you use a stepladder:  Make sure that it is fully opened. Do not climb a closed stepladder.  Make sure that both sides of the stepladder are locked into place.  Ask someone to hold it for you, if possible.  Clearly mark and make sure that you can see:  Any grab bars or handrails.  First and last steps.  Where the edge of each step is.  Use tools that help you move around (mobility aids) if they are needed. These include:  Canes.  Walkers.  Scooters.  Crutches.  Turn on the lights when you go into a dark area. Replace any light bulbs as soon as they burn out.  Set up your furniture so you have a clear path. Avoid moving your furniture around.  If any of your floors are uneven, fix them.  If there are any pets around you, be aware of where they are.  Review your medicines with your doctor. Some medicines can make you feel dizzy. This can increase your chance of falling. Ask your doctor what other things that you can do to help prevent falls. This information is not intended to replace advice given to you by your health care provider. Make sure you discuss any questions you have with your health care provider. Document Released: 08/01/2009 Document Revised: 03/12/2016 Document Reviewed: 11/09/2014 Elsevier Interactive Patient Education  2017 [employer]    Exercises To Do While Sitting  Exercises that you do while sitting (chair exercises) can give you many of the same benefits as full exercise. Benefits include strengthening your heart, burning calories, and keeping muscles and joints healthy. Exercise can also improve your mood and help with depression and anxiety. You may benefit from chair exercises if you are unable to do standing exercises because of:  Diabetic foot pain.  Obesity.  Illness.  Arthritis.  Recovery from surgery or injury.  Breathing problems.  Balance  problems.  Another type of disability. Before starting chair exercises, check with your health care provider or a physical therapist to find out how much exercise you can tolerate and which exercises are safe for you. If your health care provider approves:  Start out slowly and build up over time. Aim to work up to about 10-20 minutes for each exercise session.  Make exercise part of your daily routine.  Drink water  when you exercise. Do not wait until you are thirsty. Drink every 10-15 minutes.  Stop exercising right away if you have pain, nausea, shortness of breath, or dizziness.  If you are exercising in a wheelchair, make sure to lock the wheels.  Ask your health care provider whether you can do tai chi or yoga. Many positions in these mind-body exercises can be modified to do while seated. Warm-up Before starting other exercises: 1. Sit up as straight as you can. Have your knees bent at 90 degrees, which is the shape of the capital letter "L." Keep your feet flat on the floor. 2. Sit at the front edge of your chair, if you can. 3. Pull in (tighten) the muscles in your abdomen and stretch your spine and neck as straight as you can. Hold this position for a few minutes. 4. Breathe in and out evenly. Try to concentrate on your breathing, and relax your mind. Stretching Exercise A: Arm stretch 1. Hold your arms out straight in front of your body. 2. Bend your hands at the wrist with your fingers pointing up, as if signaling someone to stop. Notice the slight tension in your forearms as you hold the position. 3. Keeping your arms out and your hands bent, rotate your hands outward as far as you can and hold this stretch. Aim to have your thumbs pointing up and your pinkie fingers pointing down. Slowly repeat arm stretches for one minute as tolerated. Exercise B: Leg stretch 1. If you can move your legs, try to "draw" letters on the floor with the toes of your foot. Write your name with  one foot. 2. Write your name with the toes of your other foot. Slowly repeat the movements for one minute as tolerated. Exercise C: Reach for the sky 1. Reach your hands as far over your head as you can to stretch your spine. 2. Move your hands and arms as if you are climbing a rope. Slowly repeat the movements for one minute as tolerated. Range of motion exercises Exercise A: Shoulder roll 1. Let your arms hang loosely at your sides. 2. Lift just your shoulders up toward your ears, then let them relax back down. 3. When your shoulders feel loose, rotate your shoulders in backward and forward circles. Do shoulder rolls slowly for one minute as tolerated. Exercise B: March in place 1. As if you are marching, pump your arms and lift your legs up and down. Lift your knees as high as you can. ? If you are unable to lift your knees, just pump your arms and move your ankles and feet up and down. March in place for one minute as tolerated. Exercise C: Seated jumping jacks 1. Let your arms hang down straight. 2. Keeping your arms straight, lift them up over your head. Aim to point your fingers to the ceiling. 3. While you lift your arms, straighten your legs and slide your heels along the floor to your sides, as wide as you can. 4. As you bring your arms back down to your sides, slide your legs back together. ? If you are unable to use your legs, just move your arms. Slowly repeat seated jumping jacks for one minute as tolerated. Strengthening exercises Exercise A: Shoulder squeeze 1. Hold your arms straight out from your body to your sides, with your elbows bent and your fists pointed at the ceiling. 2. Keeping your arms in the bent position, move them forward so your elbows and forearms meet  in front of your face. 3. Open your arms back out as wide as you can with your elbows still bent, until you feel your shoulder blades squeezing together. Hold for 5 seconds. Slowly repeat the movements  forward and backward for one minute as tolerated. Contact a health care provider if you:  Had to stop exercising due to any of the following: ? Pain. ? Nausea. ? Shortness of breath. ? Dizziness. ? Fatigue.  Have significant pain or soreness after exercising. Get help right away if you have:  Chest pain.  Difficulty breathing. These symptoms may represent a serious problem that is an emergency. Do not wait to see if the symptoms will go away. Get medical help right away. Call your local emergency services (911 in the [place] Do not drive yourself to the hospital. This information is not intended to replace advice given to you by your health care provider. Make sure you discuss any questions you have with your health care provider. Document Revised: 01/26/2019 Document Reviewed: 08/18/2017 Elsevier Patient Education  2020 [employer].

## 2019-12-27 NOTE — Patient Instructions (Addendum)
Visit Information  Goals Addressed            This Visit's Progress    RNCM: I would like help getting a new power wheelchair       CARE PLAN ENTRY (see longtitudinal plan of care for additional care plan information)  Current Barriers:   Chronic Disease Management support, education, and care coordination needs related to HTN, HLD, and DMII  Equipment Needs  Limited Mobility due to Chronic Health Conditions   Clinical Goal(s) related to HTN, HLD, and DMII:  Over the next 120 days, patient will:   Work with the care management team to address educational, disease management, and care coordination needs   Begin or continue self health monitoring activities as directed today Measure and record cbg (blood glucose) 2 times weekly or prn, Measure and record blood pressure 3 times per week, and adhere to heart healthy/ADA diet  Call provider office for new or worsened signs and symptoms Blood glucose findings outside established parameters, Blood pressure findings outside established parameters, and New or worsened symptom related to HLD and other chronic health conditions  Call care management team with questions or concerns  Verbalize basic understanding of patient centered plan of care established today  Interventions related to HTN, HLD, and DMII:   Evaluation of current treatment plans and patient's adherence to plan as established by provider  Reviewed hospitalization from February. The patient is doing well and has no concerns related to hospitalization   Assessed patient understanding of disease states- good understanding of chronic health conditions, works with the diabetes education program. Last hemoglobin A1C 6.1.  Patients states her blood sugars are 95-105 when she checks.   Assessed blood pressure readings. The patient states that her blood pressure is running a little high and Dr. Army Melia is having to "tweak" her medications. This am it was 160/76.  The patient  denies having a headache when her blood pressure is elevated.   Assessed patient's education and care coordination needs- The patient currently has a [id number] power chair, the chair is 68 years old. The patient wants assistance with obtaining a new power chair that raises and lowers.  The patient has a catalog with [employer].  She is interested in the [place] cyr 2.  Care guide referral for assistance and help with best way to obtain DME and work with pcp for writing order.  Also spoke with Janalyn Shy due to insurance with [facility] and Medicare part A and B for assistance and direction. Will call the patient with findings as soon as they are available.   Provided disease specific education to patient   Collaborated with appropriate clinical care team members regarding patient needs- the patient is aware of pharmacist and LCSW on the CCM team for assistance.  Patient Self Care Activities related to HTN, HLD, and DMII:   Patient is unable to independently self-manage chronic health conditions  Initial goal documentation        Ms. Legendre was given information about Care Management services today including:  1. Care Management services include personalized support from designated clinical staff supervised by her physician, including individualized plan of care and coordination with other care providers 2. 24/7 contact phone numbers for assistance for urgent and routine care needs. 3. The patient may stop CCM services at any time (effective at the end of the month) by phone call to the office staff.  Patient agreed to services and verbal consent obtained.   Patient verbalizes understanding  of instructions provided today.   The care management team will reach out to the patient again over the next 7 to 14  days.   Noreene Larsson RN, MSN, [facility] Mobile: [phone]

## 2019-12-27 NOTE — Chronic Care Management (AMB) (Signed)
Care Management   Initial Visit Note  12/27/2019 Name: Maria Singh MRN: [id number] DOB: 1950-11-13  Subjective:   Objective:  Assessment: Maria Singh is a 69 y.o. year old female who sees Glean Hess, MD for primary care. The care management team was consulted for assistance with care management and care coordination needs related to Disease Management Educational Needs Care Coordination.   Review of patient status, including review of consultants reports, relevant laboratory and other test results, and collaboration with appropriate care team members and the patient's provider was performed as part of comprehensive patient evaluation and provision of care management services.    SDOH (Social Determinants of Health) assessments performed: Yes See Care Plan activities for detailed interventions related to [facility])     Outpatient Encounter Medications as of 12/27/2019  Medication Sig Note  . Accu-Chek FastClix Lancets MISC TEST ONCE DAILY   . ACCU-CHEK GUIDE test strip CHECK BLOOD SUGAR TWO TIMES DAILY   . acetaminophen (TYLENOL) 500 MG tablet Take 500 mg by mouth every 6 (six) hours as needed.   Marland Kitchen amLODipine (NORVASC) 5 MG tablet Take 1 tablet (5 mg total) by mouth daily.   Marland Kitchen aspirin 81 MG tablet Take 81 mg by mouth daily.   . clotrimazole-betamethasone (LOTRISONE) cream Apply 1 application topically 2 (two) times daily.   Marland Kitchen docusate sodium (COLACE) 100 MG capsule Take 100 mg by mouth daily.    . ferrous sulfate 325 (65 FE) MG tablet Take 1 tablet (325 mg total) by mouth daily with breakfast.   . HUMALOG KWIKPEN 100 UNIT/ML KwikPen INJECT 22 UNITS TOTAL INTO THE SKIN 3 THREE TIMES DAILY.   . hydrochlorothiazide (HYDRODIURIL) 25 MG tablet Take 1 tablet (25 mg total) by mouth daily. New blood pressure medication.   . Insulin Pen Needle (NOVOFINE) 32G X 6 MM MISC USE AS DIRECTED 3 TIMES A DAY   . LANTUS SOLOSTAR 100 UNIT/ML Solostar Pen INJECT 60 UNITS UNDER THE SKIN IN  THE MORNING AND 50 UNITS UNDER THE SKIN IN THE EVENING   . liraglutide (VICTOZA) 18 MG/3ML SOPN INJECT 1.8 MLS (10.8 MG TOTAL) INTO THE SKIN ONCE DAILY   . loratadine (CLARITIN) 10 MG tablet Take 10 mg by mouth daily. 07/19/2018: States she takes prn.  . metFORMIN (GLUCOPHAGE) 1000 MG tablet Take 1 tablet (1,000 mg total) by mouth 2 (two) times daily.   . mupirocin ointment (BACTROBAN) 2 % APPLY TO AFFECTED AREA(S) AS NEEDED   . NON FORMULARY Auto CPap nightly   . nystatin cream (MYCOSTATIN) Apply topically 2 (two) times daily.   . rosuvastatin (CRESTOR) 5 MG tablet Take 1 tablet (5 mg total) by mouth daily.   . sertraline (ZOLOFT) 100 MG tablet Take 1 tablet (100 mg total) by mouth daily.    No facility-administered encounter medications on file as of 12/27/2019.    Goals Addressed            This Visit's Progress   . RNCM: I would like help getting a new power wheelchair       [place] (see longtitudinal plan of care for additional care plan information)  Current Barriers:  . Chronic Disease Management support, education, and care coordination needs related to HTN, HLD, and DMII . Equipment Needs . Limited Mobility due to Chronic Health Conditions   Clinical Goal(s) related to HTN, HLD, and DMII:  Over the next 120 days, patient will:  . Work with the care management team to address educational,  disease management, and care coordination needs  . Begin or continue self health monitoring activities as directed today Measure and record cbg (blood glucose) 2 times weekly or prn, Measure and record blood pressure 3 times per week, and adhere to heart healthy/ADA diet . Call provider office for new or worsened signs and symptoms Blood glucose findings outside established parameters, Blood pressure findings outside established parameters, and New or worsened symptom related to HLD and other chronic health conditions . Call care management team with questions or concerns . Verbalize  basic understanding of patient centered plan of care established today  Interventions related to HTN, HLD, and DMII:  . Evaluation of current treatment plans and patient's adherence to plan as established by provider . Reviewed hospitalization from February. The patient is doing well and has no concerns related to hospitalization  . Assessed patient understanding of disease states- good understanding of chronic health conditions, works with the diabetes education program. Last hemoglobin A1C 6.1.  Patients states her blood sugars are 95-105 when she checks.  . Assessed blood pressure readings. The patient states that her blood pressure is running a little high and Dr. Army Melia is having to "tweak" her medications. This am it was 160/76.  The patient denies having a headache when her blood pressure is elevated.  . Assessed patient's education and care coordination needs- The patient currently has a [id number] power chair, the chair is 69 years old. The patient wants assistance with obtaining a new power chair that raises and lowers.  The patient has a catalog with [employer].  She is interested in the [place] cyr 2.  Care guide referral for assistance and help with best way to obtain DME and work with pcp for writing order.  Also spoke with Janalyn Shy due to insurance with [facility] [employer] and Medicare part A and B for assistance and direction. Will call the patient with findings as soon as they are available.  . Provided disease specific education to patient  . Collaborated with appropriate clinical care team members regarding patient needs- the patient is aware of pharmacist and LCSW on the CCM team for assistance.  Patient Self Care Activities related to HTN, HLD, and DMII:  . Patient is unable to independently self-manage chronic health conditions  Initial goal documentation         Follow up plan:  The care management team will reach out to the patient again over the next 7 to 14  days.   Maria Singh  was given information about Care Management services today including:  1. Care Management services include personalized support from designated clinical staff supervised by a physician, including individualized plan of care and coordination with other care providers 2. 24/7 contact phone numbers for assistance for urgent and routine care needs. 3. The patient may stop Care Management services at any time (effective at the end of the month) by phone call to the office staff.  Patient agreed to services and verbal consent obtained.  Noreene Larsson RN, MSN, [place] [facility] Mobile: [phone]

## 2019-12-28 ENCOUNTER — Telehealth: Payer: Self-pay

## 2019-12-28 ENCOUNTER — Ambulatory Visit: Payer: Self-pay | Admitting: General Practice

## 2019-12-28 DIAGNOSIS — R262 Difficulty in walking, not elsewhere classified: Secondary | ICD-10-CM

## 2019-12-28 DIAGNOSIS — E782 Mixed hyperlipidemia: Secondary | ICD-10-CM

## 2019-12-28 DIAGNOSIS — I10 Essential (primary) hypertension: Secondary | ICD-10-CM

## 2019-12-28 DIAGNOSIS — F4320 Adjustment disorder, unspecified: Secondary | ICD-10-CM | POA: Diagnosis not present

## 2019-12-28 NOTE — Chronic Care Management (AMB) (Signed)
Care Management   Follow Up Note   12/28/2019 Name: Maria Singh MRN: [id number] DOB: February 09, 1951  Referred by: Glean Hess, MD Reason for referral : Care Coordination (Follow up on power chair information and care coordination needs )   Maria Singh is a 69 y.o. year old female who is a primary care patient of Glean Hess, MD. The care management team was consulted for assistance with care management and care coordination needs.    Review of patient status, including review of consultants reports, relevant laboratory and other test results, and collaboration with appropriate care team members and the patient's provider was performed as part of comprehensive patient evaluation and provision of chronic care management services.    SDOH (Social Determinants of Health) assessments performed: No See Care Plan activities for detailed interventions related to [facility])     Advanced Directives: See Care Plan and Vynca application for related entries.   Goals Addressed            This Visit's Progress    RNCM: I would like help getting a new power wheelchair       CARE PLAN ENTRY (see longtitudinal plan of care for additional care plan information)  Current Barriers:   Chronic Disease Management support, education, and care coordination needs related to HTN, HLD, and DMII  Equipment Needs  Limited Mobility due to Chronic Health Conditions   Clinical Goal(s) related to HTN, HLD, and DMII:  Over the next 120 days, patient will:   Work with the care management team to address educational, disease management, and care coordination needs   Begin or continue self health monitoring activities as directed today Measure and record cbg (blood glucose) 2 times weekly or prn, Measure and record blood pressure 3 times per week, and adhere to heart healthy/ADA diet  Call provider office for new or worsened signs and symptoms Blood glucose findings outside established  parameters, Blood pressure findings outside established parameters, and New or worsened symptom related to HLD and other chronic health conditions  Call care management team with questions or concerns  Verbalize basic understanding of patient centered plan of care established today  Interventions related to HTN, HLD, and DMII:   Evaluation of current treatment plans and patient's adherence to plan as established by provider  Reviewed hospitalization from February. The patient is doing well and has no concerns related to hospitalization   Assessed patient understanding of disease states- good understanding of chronic health conditions, works with the diabetes education program. Last hemoglobin A1C 6.1.  Patients states her blood sugars are 95-105 when she checks.   Assessed blood pressure readings. The patient states that her blood pressure is running a little high and Dr. Army Melia is having to "tweak" her medications. This am it was 160/76.  The patient denies having a headache when her blood pressure is elevated.   Assessed patient's education and care coordination needs- The patient currently has a [id number] power chair, the chair is 69 years old. The patient wants assistance with obtaining a new power chair that raises and lowers.  The patient has a catalog with [employer].  She is interested in the [place] cyr 2.  Care guide referral for assistance and help with best way to obtain DME and work with pcp for writing order.  Also spoke with Janalyn Shy due to insurance with [facility] [employer] and Medicare part A and B for assistance and direction. Will call the patient with findings as soon as  they are available. Updated information: Call to patient by [facility] to let her know about the Medicare requirements for a power wheelchair.  Also let the patient know there is a medical supply company in [place] that carry and can order the [employer]. Provided this information to the patient and will also send the  information by email to [email or web address].  Care guide also spoke to the patient and gave her information as well. Will continue to assist as needed in getting the patient power wheelchair to meet her health and wellness goals.  Provided disease specific education to patient   Collaborated with appropriate clinical care team members regarding patient needs- the patient is aware of pharmacist and LCSW on the CCM team for assistance.  Patient Self Care Activities related to HTN, HLD, and DMII:   Patient is unable to independently self-manage chronic health conditions  Please see past updates related to this goal by clicking on the "Past Updates" button in the selected goal          The care management team will reach out to the patient again over the next 60 days.   Noreene Larsson RN, MSN, [place] [facility] Mobile: [phone]

## 2019-12-28 NOTE — Telephone Encounter (Signed)
12/28/2019 Spoke with patient about verifying her coverage with UMR since she is covered as a dependent this may be her secondary insurance.  She will call me back and let me know.  [employer] has the [employer] of wheelchairs but can only [employer] as the primary insurance.  Ambrose Mantle [phone]

## 2019-12-28 NOTE — Patient Instructions (Signed)
Visit Information  Goals Addressed            This Visit's Progress    RNCM: I would like help getting a new power wheelchair       CARE PLAN ENTRY (see longtitudinal plan of care for additional care plan information)  Current Barriers:   Chronic Disease Management support, education, and care coordination needs related to HTN, HLD, and DMII  Equipment Needs  Limited Mobility due to Chronic Health Conditions   Clinical Goal(s) related to HTN, HLD, and DMII:  Over the next 120 days, patient will:   Work with the care management team to address educational, disease management, and care coordination needs   Begin or continue self health monitoring activities as directed today Measure and record cbg (blood glucose) 2 times weekly or prn, Measure and record blood pressure 3 times per week, and adhere to heart healthy/ADA diet  Call provider office for new or worsened signs and symptoms Blood glucose findings outside established parameters, Blood pressure findings outside established parameters, and New or worsened symptom related to HLD and other chronic health conditions  Call care management team with questions or concerns  Verbalize basic understanding of patient centered plan of care established today  Interventions related to HTN, HLD, and DMII:   Evaluation of current treatment plans and patient's adherence to plan as established by provider  Reviewed hospitalization from February. The patient is doing well and has no concerns related to hospitalization   Assessed patient understanding of disease states- good understanding of chronic health conditions, works with the diabetes education program. Last hemoglobin A1C 6.1.  Patients states her blood sugars are 95-105 when she checks.   Assessed blood pressure readings. The patient states that her blood pressure is running a little high and Dr. Army Melia is having to "tweak" her medications. This am it was 160/76.  The patient  denies having a headache when her blood pressure is elevated.   Assessed patient's education and care coordination needs- The patient currently has a [id number] power chair, the chair is 69 years old. The patient wants assistance with obtaining a new power chair that raises and lowers.  The patient has a catalog with [employer].  She is interested in the [place] cyr 2.  Care guide referral for assistance and help with best way to obtain DME and work with pcp for writing order.  Also spoke with Janalyn Shy due to insurance with [facility] [employer] and Medicare part A and B for assistance and direction. Will call the patient with findings as soon as they are available. Updated information: Call to patient by [facility] to let her know about the Medicare requirements for a power wheelchair.  Also let the patient know there is a medical supply company in [place] that carry and can order the [employer]. Provided this information to the patient and will also send the information by email to [email or web address].  Care guide also spoke to the patient and gave her information as well. Will continue to assist as needed in getting the patient power wheelchair to meet her health and wellness goals.  Provided disease specific education to patient   Collaborated with appropriate clinical care team members regarding patient needs- the patient is aware of pharmacist and LCSW on the CCM team for assistance.  Patient Self Care Activities related to HTN, HLD, and DMII:   Patient is unable to independently self-manage chronic health conditions  Please see past updates related to this goal by  clicking on the "Past Updates" button in the selected goal         Patient verbalizes understanding of instructions provided today.   The care management team will reach out to the patient again over the next 60 days.   Noreene Larsson RN, MSN, [place] [facility] Mobile:  [phone]

## 2020-01-06 DIAGNOSIS — M6282 Rhabdomyolysis: Secondary | ICD-10-CM | POA: Diagnosis not present

## 2020-01-11 DIAGNOSIS — F4320 Adjustment disorder, unspecified: Secondary | ICD-10-CM | POA: Diagnosis not present

## 2020-01-31 ENCOUNTER — Other Ambulatory Visit: Payer: Self-pay | Admitting: Family Medicine

## 2020-01-31 ENCOUNTER — Other Ambulatory Visit: Payer: Self-pay | Admitting: Internal Medicine

## 2020-01-31 DIAGNOSIS — R21 Rash and other nonspecific skin eruption: Secondary | ICD-10-CM

## 2020-01-31 MED ORDER — MUPIROCIN 2 % EX OINT: TOPICAL_OINTMENT | CUTANEOUS | 5 refills | Status: DC

## 2020-01-31 MED ORDER — NYSTATIN 100000 UNIT/GM EX CREA: TOPICAL_CREAM | Freq: Two times a day (BID) | CUTANEOUS | 5 refills | Status: DC

## 2020-02-06 DIAGNOSIS — M6282 Rhabdomyolysis: Secondary | ICD-10-CM | POA: Diagnosis not present

## 2020-02-09 ENCOUNTER — Other Ambulatory Visit: Payer: Self-pay | Admitting: Internal Medicine

## 2020-02-09 DIAGNOSIS — E109 Type 1 diabetes mellitus without complications: Secondary | ICD-10-CM

## 2020-02-09 NOTE — Telephone Encounter (Signed)
Requested medication (s) are due for refill today:   Yes  Requested medication (s) are on the active medication list:   Yes  Future visit scheduled:   No   Last ordered: 11/29/2019  90 ml  Clinic Note:   Returned because her insurance only covers [employer] now.   Needs a new script.     Requested Prescriptions  Pending Prescriptions Disp Refills   LANTUS SOLOSTAR 100 UNIT/ML Solostar Pen [Pharmacy Med Name: LANTUS SOLOSTAR 100 UNITS/M 100 Solution Pen-injector] 90 mL PRN    Sig: INJECT 60 UNITS UNDER THE SKIN IN THE MORNING AND 71 UNITS UNDER THE SKIN IN THE EVENING      Endocrinology:  Diabetes - Insulins Passed - 02/09/2020  1:07 PM      Passed - HBA1C is between 0 and 7.9 and within 180 days    Hemoglobin A1C  Date Value Ref Range Status  08/09/2013 7.2 (H) 4.2 - 6.3 % Final    Comment:    The American Diabetes Association recommends that a primary goal of therapy should be <7% and that physicians should reevaluate the treatment regimen in patients with HbA1c values consistently >8%.    Hgb A1c MFr Bld  Date Value Ref Range Status  12/07/2019 6.1 (H) 4.8 - 5.6 % Final    Comment:    (NOTE) Pre diabetes:          5.7%-6.4% Diabetes:              >6.4% Glycemic control for   <7.0% adults with diabetes           Passed - Valid encounter within last 6 months    Recent Outpatient Visits           1 month ago Hyperkalemia   [facility] Glean Hess, MD   8 months ago Annual physical exam   [facility] Glean Hess, MD   1 year ago Type 2 diabetes mellitus with diabetic dermatitis, with long-term current use of insulin [facility])   [facility] Glean Hess, MD   1 year ago Sepsis without acute organ dysfunction, due to unspecified organism [facility])   [facility] Glean Hess, MD   1 year ago Annual physical exam   [facility] Glean Hess, MD

## 2020-02-16 ENCOUNTER — Other Ambulatory Visit: Payer: Self-pay | Admitting: Nephrology

## 2020-02-16 DIAGNOSIS — I10 Essential (primary) hypertension: Secondary | ICD-10-CM | POA: Diagnosis not present

## 2020-02-16 DIAGNOSIS — E872 Acidosis: Secondary | ICD-10-CM | POA: Diagnosis not present

## 2020-02-16 DIAGNOSIS — E875 Hyperkalemia: Secondary | ICD-10-CM | POA: Diagnosis not present

## 2020-02-20 ENCOUNTER — Other Ambulatory Visit: Payer: Self-pay

## 2020-02-20 ENCOUNTER — Telehealth: Payer: Self-pay

## 2020-02-20 MED ORDER — BASAGLAR KWIKPEN 100 UNIT/ML ~~LOC~~ SOPN: PEN_INJECTOR | SUBCUTANEOUS | 1 refills | Status: DC

## 2020-02-20 NOTE — Progress Notes (Signed)
[facility] called saying Lantus is no longer covered by pts insurance. Needs changed to [place]. Sent in [place] and discontinued Lantus.   CM

## 2020-02-20 NOTE — Telephone Encounter (Signed)
Completed PA on [email or web address] for pts Basaglar pen. She uses 11 pens for 30 days OR 90 mLs for 90 days.   (KeyCM:[id number]) PA Case ID: [id number] Rx #: [id number]  Awaiting for Pa response from [place].

## 2020-02-21 NOTE — Telephone Encounter (Signed)
Received FAX from [place] saying PA is not required because it is a covered benefit.  CM

## 2020-03-01 ENCOUNTER — Other Ambulatory Visit: Payer: Self-pay

## 2020-03-01 DIAGNOSIS — R21 Rash and other nonspecific skin eruption: Secondary | ICD-10-CM

## 2020-03-01 MED ORDER — MUPIROCIN 2 % EX OINT: TOPICAL_OINTMENT | CUTANEOUS | 5 refills | Status: DC

## 2020-03-01 MED ORDER — NYSTATIN 100000 UNIT/GM EX CREA: TOPICAL_CREAM | Freq: Two times a day (BID) | CUTANEOUS | 5 refills | Status: DC

## 2020-03-07 DIAGNOSIS — E872 Acidosis: Secondary | ICD-10-CM | POA: Diagnosis not present

## 2020-03-07 DIAGNOSIS — E875 Hyperkalemia: Secondary | ICD-10-CM | POA: Diagnosis not present

## 2020-03-07 DIAGNOSIS — M6282 Rhabdomyolysis: Secondary | ICD-10-CM | POA: Diagnosis not present

## 2020-03-11 ENCOUNTER — Other Ambulatory Visit: Payer: Self-pay

## 2020-03-11 ENCOUNTER — Other Ambulatory Visit: Payer: Self-pay | Admitting: Internal Medicine

## 2020-03-11 MED ORDER — HYDROCHLOROTHIAZIDE 25 MG PO TABS: 25.0000 mg | ORAL_TABLET | Freq: Every day | ORAL | 2 refills | Status: DC

## 2020-03-20 ENCOUNTER — Other Ambulatory Visit: Payer: Self-pay | Admitting: Internal Medicine

## 2020-03-20 DIAGNOSIS — E875 Hyperkalemia: Secondary | ICD-10-CM | POA: Diagnosis not present

## 2020-03-21 LAB — RENAL FUNCTION PANEL
Albumin: 4.3 g/dL (ref 3.8–4.8)
BUN/Creatinine Ratio: 30 — ABNORMAL HIGH (ref 12–28)
BUN: 27 mg/dL (ref 8–27)
CO2: 20 mmol/L (ref 20–29)
Calcium: 9.3 mg/dL (ref 8.7–10.3)
Chloride: 105 mmol/L (ref 96–106)
Creatinine, Ser: 0.89 mg/dL (ref 0.57–1.00)
GFR calc Af Amer: 78 mL/min/{1.73_m2} (ref >59)
GFR calc non Af Amer: 67 mL/min/{1.73_m2} (ref >59)
Glucose: 117 mg/dL — ABNORMAL HIGH (ref 65–99)
Phosphorus: 3.6 mg/dL (ref 3.0–4.3)
Potassium: 4.7 mmol/L (ref 3.5–5.2)
Sodium: 141 mmol/L (ref 134–144)

## 2020-04-04 ENCOUNTER — Other Ambulatory Visit: Payer: Self-pay | Admitting: Internal Medicine

## 2020-04-04 DIAGNOSIS — E1122 Type 2 diabetes mellitus with diabetic chronic kidney disease: Secondary | ICD-10-CM | POA: Diagnosis not present

## 2020-04-04 DIAGNOSIS — N2589 Other disorders resulting from impaired renal tubular function: Secondary | ICD-10-CM

## 2020-04-04 DIAGNOSIS — E875 Hyperkalemia: Secondary | ICD-10-CM | POA: Diagnosis not present

## 2020-04-04 DIAGNOSIS — I10 Essential (primary) hypertension: Secondary | ICD-10-CM | POA: Diagnosis not present

## 2020-04-04 DIAGNOSIS — N182 Chronic kidney disease, stage 2 (mild): Secondary | ICD-10-CM | POA: Diagnosis not present

## 2020-04-07 DIAGNOSIS — M6282 Rhabdomyolysis: Secondary | ICD-10-CM | POA: Diagnosis not present

## 2020-05-07 ENCOUNTER — Other Ambulatory Visit: Payer: Self-pay | Admitting: Internal Medicine

## 2020-05-07 DIAGNOSIS — E109 Type 1 diabetes mellitus without complications: Secondary | ICD-10-CM

## 2020-05-07 DIAGNOSIS — E1165 Type 2 diabetes mellitus with hyperglycemia: Secondary | ICD-10-CM

## 2020-05-07 DIAGNOSIS — M6282 Rhabdomyolysis: Secondary | ICD-10-CM | POA: Diagnosis not present

## 2020-05-07 NOTE — Telephone Encounter (Signed)
Requested Prescriptions  Pending Prescriptions Disp Refills  . VICTOZA 18 MG/3ML SOPN [Pharmacy Med Name: VICTOZA 18 MG/3 ML INJECT P 18 Solution Pen-injector] 27 mL 5    Sig: INJECT 1.8 MLS (10.8 MG TOTAL) INTO THE SKIN ONCE DAILY     Endocrinology:  Diabetes - GLP-1 Receptor Agonists Passed - 05/07/2020  2:23 PM      Passed - HBA1C is between 0 and 7.9 and within 180 days    Hemoglobin A1C  Date Value Ref Range Status  08/09/2013 7.2 (H) 4.2 - 6.3 % Final    Comment:    The American Diabetes Association recommends that a primary goal of therapy should be <7% and that physicians should reevaluate the treatment regimen in patients with HbA1c values consistently >8%.    Hgb A1c MFr Bld  Date Value Ref Range Status  12/07/2019 6.1 (H) 4.8 - 5.6 % Final    Comment:    (NOTE) Pre diabetes:          5.7%-6.4% Diabetes:              >6.4% Glycemic control for   <7.0% adults with diabetes          Passed - Valid encounter within last 6 months    Recent Outpatient Visits          4 months ago Hyperkalemia   [facility] Glean Hess, MD   11 months ago Annual physical exam   [facility] Glean Hess, MD   1 year ago Type 2 diabetes mellitus with diabetic dermatitis, with long-term current use of insulin [facility])   [facility] Glean Hess, MD   1 year ago Sepsis without acute organ dysfunction, due to unspecified organism [facility])   [facility] Glean Hess, MD   2 years ago Annual physical exam   [facility] Glean Hess, MD

## 2020-05-23 ENCOUNTER — Ambulatory Visit: Payer: Self-pay | Admitting: General Practice

## 2020-05-23 DIAGNOSIS — I10 Essential (primary) hypertension: Secondary | ICD-10-CM

## 2020-05-23 DIAGNOSIS — R262 Difficulty in walking, not elsewhere classified: Secondary | ICD-10-CM

## 2020-05-23 DIAGNOSIS — E118 Type 2 diabetes mellitus with unspecified complications: Secondary | ICD-10-CM

## 2020-05-23 DIAGNOSIS — E875 Hyperkalemia: Secondary | ICD-10-CM

## 2020-05-23 DIAGNOSIS — E782 Mixed hyperlipidemia: Secondary | ICD-10-CM

## 2020-05-23 NOTE — Chronic Care Management (AMB) (Signed)
Care Management   Follow Up Note   05/23/2020 Name: Huda Petrey MRN: [id number] DOB: 06/28/1951  Referred by: Glean Hess, MD Reason for referral : Chronic Care Management (RNCM Chronic Disease Managment and Care Coorination Needs)   Zaniya Aidee Latimore is a 69 y.o. year old female who is a primary care patient of Glean Hess, MD. The care management team was consulted for assistance with care management and care coordination needs.    Review of patient status, including review of consultants reports, relevant laboratory and other test results, and collaboration with appropriate care team members and the patient's provider was performed as part of comprehensive patient evaluation and provision of chronic care management services.    SDOH (Social Determinants of Health) assessments performed: Yes See Care Plan activities for detailed interventions related to [facility])     Advanced Directives: See Care Plan and Vynca application for related entries.   Goals Addressed            This Visit's Progress   . RNCM: I would like help getting a new power wheelchair       [place] (see longtitudinal plan of care for additional care plan information)  Current Barriers:  . Chronic Disease Management support, education, and care coordination needs related to HTN, HLD, and DMII . Equipment Needs . Limited Mobility due to Chronic Health Conditions   Clinical Goal(s) related to HTN, HLD, and DMII:  Over the next 120 days, patient will:  . Work with the care management team to address educational, disease management, and care coordination needs  . Begin or continue self health monitoring activities as directed today Measure and record cbg (blood glucose) 2 times weekly or prn, Measure and record blood pressure 3 times per week, and adhere to heart healthy/ADA diet . Call provider office for new or worsened signs and symptoms Blood glucose findings outside established  parameters, Blood pressure findings outside established parameters, and New or worsened symptom related to HLD and other chronic health conditions . Call care management team with questions or concerns . Verbalize basic understanding of patient centered plan of care established today  Interventions related to HTN, HLD, and DMII:  . Evaluation of current treatment plans and patient's adherence to plan as established by provider.  05-23-2020: The patient feels she is "maintaining".  She states she saw Dr. Holley Raring recently and the current regimen that she is on Veltassa 8.4g is working well at keeping her potassium stabilized. . Assessed patient understanding of disease states- good understanding of chronic health conditions, works with the diabetes education program. Last hemoglobin A1C 6.1. 05-23-2020: The patient has switched from Lantus to [place] due to insurance coverage requirements. She wants to talk to her pcp about her fasting blood sugars being around 130's now. She has not had any changes that she knows of except for the change in Lantus to [place]. Will plan on talking to Dr. Army Melia concerning this. Denies any episodes of hyperglycemia.  . Assessed blood pressure readings. The patient states that her blood pressure is running a little high and Dr. Army Melia is having to "tweak" her medications. This am it was 160/76.  The patient denies having a headache when her blood pressure is elevated.  05-23-2020: The patient is monitoring her blood pressures. They can be up and down also.  She lets the pcp know when blood pressures are out of range.  . Assessed patient's education and care coordination needs- The patient currently has  a [id number] power chair, the chair is 68 years old. The patient wants assistance with obtaining a new power chair that raises and lowers.  The patient has a catalog with [employer].  She is interested in the [place] cyr 2.  Care guide referral for assistance and help with best way to  obtain DME and work with pcp for writing order.  Also spoke with Janalyn Shy due to insurance with [employer] and Medicare part A and B for assistance and direction. Will call the patient with findings as soon as they are available. Updated information: Call to patient by [facility] to let her know about the Medicare requirements for a power wheelchair.  Also let the patient know there is a medical supply company in [place] that carry and can order the [employer]. Provided this information to the patient and will also send the information by email to [email or web address].  Care guide also spoke to the patient and gave her information as well. Will continue to assist as needed in getting the patient power wheelchair to meet her health and wellness goals. 05-23-2020: The patient states she still does not have her new power chair. The patient states she has talked to several people but has not heard back from anyone recently. Information provided today to the patient with the number to [facility] [phone], fax [phone],  [address], [place] [address],  and [employer] sales person direct number [phone] in [place]. One of the barriers to getting Medicare to pay for the DME is the primary insurance is with [employer]. The patient was thankful for the contact information. The patient will call the [facility] for any questions or additional help needed.  . Provided disease specific education to patient. 05-23-2020: Review of dietary habits. The patient is enjoying fresh fruits and vegetables from the garden.  The patient states she tries to eat healthy but sometimes "falls off of the wagon".  The patient feels overall she is doing well at maintaining her health and well being.  Nash Dimmer with appropriate clinical care team members regarding patient needs- the patient is aware of pharmacist and LCSW on the CCM team for assistance.  Patient Self Care  Activities related to HTN, HLD, and DMII:  . Patient is unable to independently self-manage chronic health conditions  Please see past updates related to this goal by clicking on the "Past Updates" button in the selected goal          The care management team will reach out to the patient again over the next 30 to 60 days.   Noreene Larsson RN, MSN, [place] [facility] Mobile: [phone]

## 2020-05-23 NOTE — Patient Instructions (Signed)
Visit Information  Goals Addressed            This Visit's Progress    RNCM: I would like help getting a new power wheelchair       CARE PLAN ENTRY (see longtitudinal plan of care for additional care plan information)  Current Barriers:   Chronic Disease Management support, education, and care coordination needs related to HTN, HLD, and DMII  Equipment Needs  Limited Mobility due to Chronic Health Conditions   Clinical Goal(s) related to HTN, HLD, and DMII:  Over the next 120 days, patient will:   Work with the care management team to address educational, disease management, and care coordination needs   Begin or continue self health monitoring activities as directed today Measure and record cbg (blood glucose) 2 times weekly or prn, Measure and record blood pressure 3 times per week, and adhere to heart healthy/ADA diet  Call provider office for new or worsened signs and symptoms Blood glucose findings outside established parameters, Blood pressure findings outside established parameters, and New or worsened symptom related to HLD and other chronic health conditions  Call care management team with questions or concerns  Verbalize basic understanding of patient centered plan of care established today  Interventions related to HTN, HLD, and DMII:   Evaluation of current treatment plans and patient's adherence to plan as established by provider.  05-23-2020: The patient feels she is "maintaining".  She states she saw Dr. Holley Raring recently and the current regimen that she is on Veltassa 8.4g is working well at keeping her potassium stabilized.  Assessed patient understanding of disease states- good understanding of chronic health conditions, works with the diabetes education program. Last hemoglobin A1C 6.1. 05-23-2020: The patient has switched from Lantus to [place] due to insurance coverage requirements. She wants to talk to her pcp about her fasting blood sugars being around 130's  now. She has not had any changes that she knows of except for the change in Lantus to [place]. Will plan on talking to Dr. Army Melia concerning this. Denies any episodes of hyperglycemia.   Assessed blood pressure readings. The patient states that her blood pressure is running a little high and Dr. Army Melia is having to "tweak" her medications. This am it was 160/76.  The patient denies having a headache when her blood pressure is elevated.  05-23-2020: The patient is monitoring her blood pressures. They can be up and down also.  She lets the pcp know when blood pressures are out of range.   Assessed patient's education and care coordination needs- The patient currently has a [id number] power chair, the chair is 68 years old. The patient wants assistance with obtaining a new power chair that raises and lowers.  The patient has a catalog with [employer].  She is interested in the [place] cyr 2.  Care guide referral for assistance and help with best way to obtain DME and work with pcp for writing order.  Also spoke with Janalyn Shy due to insurance with [employer] and Medicare part A and B for assistance and direction. Will call the patient with findings as soon as they are available. Updated information: Call to patient by [facility] to let her know about the Medicare requirements for a power wheelchair.  Also let the patient know there is a medical supply company in [place] that carry and can order the [employer]. Provided this information to the patient and will also send the information by email to [email or web address].  Care  guide also spoke to the patient and gave her information as well. Will continue to assist as needed in getting the patient power wheelchair to meet her health and wellness goals. 05-23-2020: The patient states she still does not have her new power chair. The patient states she has talked to several people but has not heard back from anyone recently. Information provided today to the patient with  the number to [facility] [phone], fax [phone],  [address], [place] [address],  and [employer] sales person direct number [phone] in [place]. One of the barriers to getting Medicare to pay for the DME is the primary insurance is with [employer]. The patient was thankful for the contact information. The patient will call the [facility] for any questions or additional help needed.   Provided disease specific education to patient. 05-23-2020: Review of dietary habits. The patient is enjoying fresh fruits and vegetables from the garden.  The patient states she tries to eat healthy but sometimes "falls off of the wagon".  The patient feels overall she is doing well at maintaining her health and well being.   Collaborated with appropriate clinical care team members regarding patient needs- the patient is aware of pharmacist and LCSW on the CCM team for assistance.  Patient Self Care Activities related to HTN, HLD, and DMII:   Patient is unable to independently self-manage chronic health conditions  Please see past updates related to this goal by clicking on the "Past Updates" button in the selected goal         Patient verbalizes understanding of instructions provided today.   The care management team will reach out to the patient again over the next 30 to 60 days.   Noreene Larsson RN, MSN, [facility] Mobile: [phone]

## 2020-05-29 ENCOUNTER — Other Ambulatory Visit: Payer: Self-pay | Admitting: Internal Medicine

## 2020-05-30 DIAGNOSIS — F4320 Adjustment disorder, unspecified: Secondary | ICD-10-CM | POA: Diagnosis not present

## 2020-06-07 DIAGNOSIS — M6282 Rhabdomyolysis: Secondary | ICD-10-CM | POA: Diagnosis not present

## 2020-06-13 DIAGNOSIS — F4320 Adjustment disorder, unspecified: Secondary | ICD-10-CM | POA: Diagnosis not present

## 2020-06-27 DIAGNOSIS — F4320 Adjustment disorder, unspecified: Secondary | ICD-10-CM | POA: Diagnosis not present

## 2020-07-08 ENCOUNTER — Other Ambulatory Visit: Payer: Self-pay | Admitting: Internal Medicine

## 2020-07-08 ENCOUNTER — Other Ambulatory Visit: Payer: Self-pay | Admitting: Family Medicine

## 2020-07-08 DIAGNOSIS — M6282 Rhabdomyolysis: Secondary | ICD-10-CM | POA: Diagnosis not present

## 2020-07-08 DIAGNOSIS — R21 Rash and other nonspecific skin eruption: Secondary | ICD-10-CM

## 2020-07-11 DIAGNOSIS — F4320 Adjustment disorder, unspecified: Secondary | ICD-10-CM | POA: Diagnosis not present

## 2020-07-25 DIAGNOSIS — F4320 Adjustment disorder, unspecified: Secondary | ICD-10-CM | POA: Diagnosis not present

## 2020-07-31 ENCOUNTER — Other Ambulatory Visit: Payer: Self-pay | Admitting: Internal Medicine

## 2020-07-31 NOTE — Telephone Encounter (Signed)
Requested medication (s) are due for refill today - expired/discontinued Rx  Requested medication (s) are on the active medication list -no  Future visit scheduled -yes  Last refill: 08/01/19  Notes to clinic: Patient request RF of discontinued medication- she has pulled muscle in back and is using short term  Requested Prescriptions  Pending Prescriptions Disp Refills   ibuprofen (ADVIL) 800 MG tablet [Pharmacy Med Name: IBUPROFEN 800 MG TABS 800 Tablet] 270 tablet 3    Sig: TAKE 1 TABLET BY MOUTH 3 TIMES DAILY      Analgesics:  NSAIDS Failed - 07/31/2020  9:33 AM      Failed - HGB in normal range and within 360 days    Hemoglobin  Date Value Ref Range Status  12/08/2019 11.1 (L) 12.0 - 15.0 g/dL Final  05/17/2019 11.3 11.1 - 15.9 g/dL Final          Passed - Cr in normal range and within 360 days    Creatinine  Date Value Ref Range Status  08/11/2013 1.08 0.60 - 1.30 mg/dL Final   Creatinine, Ser  Date Value Ref Range Status  03/20/2020 0.89 0.57 - 1.00 mg/dL Final   Creatinine, Urine  Date Value Ref Range Status  12/07/2019 21 mg/dL Final    Comment:    Performed at [facility], [address], [place], [place] [address]          [place] - Patient is not pregnant      Passed - Valid encounter within last 12 months    Recent Outpatient Visits           7 months ago Hyperkalemia   [facility] Glean Hess, MD   1 year ago Annual physical exam   [facility] Glean Hess, MD   1 year ago Type 2 diabetes mellitus with diabetic dermatitis, with long-term current use of insulin [facility])   [facility] Glean Hess, [facility]   2 years ago Sepsis without acute organ dysfunction, due to unspecified organism [facility])   [facility] Glean Hess, [facility]   2 years ago Annual physical exam   [facility] Glean Hess, MD       Future Appointments             In 6 days Glean Hess, MD [facility]             Signed Prescriptions Disp Refills   Insulin Glargine (BASAGLAR KWIKPEN) 100 UNIT/ML 90 mL 0    Sig: INJECT 60 UNITS IN THE MORNING AND 7 UNITS IN THE EVENING.      Endocrinology:  Diabetes - Insulins Failed - 07/31/2020  9:33 AM      Failed - HBA1C is between 0 and 7.9 and within 180 days    Hemoglobin A1C  Date Value Ref Range Status  08/09/2013 7.2 (H) 4.2 - 6.3 % Final    Comment:    The American Diabetes Association recommends that a primary goal of therapy should be <7% and that physicians should reevaluate the treatment regimen in patients with HbA1c values consistently >8%.    Hgb A1c MFr Bld  Date Value Ref Range Status  12/07/2019 6.1 (H) 4.8 - 5.6 % Final    Comment:    (NOTE) Pre diabetes:          5.7%-6.4% Diabetes:              >6.4%  Glycemic control for   <7.0% adults with diabetes           Failed - Valid encounter within last 6 months    Recent Outpatient Visits           7 months ago Hyperkalemia   [facility] Glean Hess, MD   1 year ago Annual physical exam   [facility] Glean Hess, MD   1 year ago Type 2 diabetes mellitus with diabetic dermatitis, with long-term current use of insulin [facility])   [facility] Glean Hess, MD   2 years ago Sepsis without acute organ dysfunction, due to unspecified organism [facility])   [facility] Glean Hess, MD   2 years ago Annual physical exam   [facility] Glean Hess, MD       Future Appointments             In 6 days Glean Hess, MD [facility], [facility]                Requested Prescriptions  Pending Prescriptions Disp Refills   ibuprofen (ADVIL) 800 MG tablet [Pharmacy Med Name: IBUPROFEN 800 MG TABS 800 Tablet] 270 tablet 3    Sig: TAKE 1 TABLET BY MOUTH 3 TIMES DAILY      Analgesics:  NSAIDS Failed - 07/31/2020  9:33 AM      Failed - HGB in normal range and within 360  days    Hemoglobin  Date Value Ref Range Status  12/08/2019 11.1 (L) 12.0 - 15.0 g/dL Final  05/17/2019 11.3 11.1 - 15.9 g/dL Final          Passed - Cr in normal range and within 360 days    Creatinine  Date Value Ref Range Status  08/11/2013 1.08 0.60 - 1.30 mg/dL Final   Creatinine, Ser  Date Value Ref Range Status  03/20/2020 0.89 0.57 - 1.00 mg/dL Final   Creatinine, Urine  Date Value Ref Range Status  12/07/2019 21 mg/dL Final    Comment:    Performed at [facility], [address], [place], [place] [address]          [place] - Patient is not pregnant      Passed - Valid encounter within last 12 months    Recent Outpatient Visits           7 months ago Hyperkalemia   [facility] Gle[facility], [facility]   1 year ago Annual physical exam   [facility] Glean Hess, MD   1 year ago Type 2 diabetes mellitus with diabetic dermatitis, with long-term current use of insulin [facility])   [facility] Glean Hess, MD   2 years ago Sepsis without acute organ dysfunction, due to unspecified organism [facility])   [facility] Glean Hess, MD   2 years ago Annual physical exam   [facility] Glean Hess, MD       Future Appointments             In 6 days Glean Hess, MD [facility]             Signed Prescriptions Disp Refills   Insulin Glargine (BASAGLAR KWIKPEN) 100 UNIT/ML 90 mL 0    Sig: INJECT 60 UNITS IN THE MORNING AND 50 UNITS IN THE EVENING.      Endocrinology:  Diabetes - Insulins Failed -  07/31/2020  9:33 AM      Failed - HBA1C is between 0 and 7.9 and within 180 days    Hemoglobin A1C  Date Value Ref Range Status  08/09/2013 7.2 (H) 4.2 - 6.3 % Final    Comment:    The American Diabetes Association recommends that a primary goal of therapy should be <7% and that physicians should reevaluate the treatment regimen in patients with HbA1c values consistently  >8%.    Hgb A1c MFr Bld  Date Value Ref Range Status  12/07/2019 6.1 (H) 4.8 - 5.6 % Final    Comment:    (NOTE) Pre diabetes:          5.7%-6.4% Diabetes:              >6.4% Glycemic control for   <7.0% adults with diabetes           Failed - Valid encounter within last 6 months    Recent Outpatient Visits           7 months ago Hyperkalemia   [facility] Glean Hess, MD   1 year ago Annual physical exam   [facility] Glean Hess, MD   1 year ago Type 2 diabetes mellitus with diabetic dermatitis, with long-term current use of insulin [facility])   [facility] Glean Hess, MD   2 years ago Sepsis without acute organ dysfunction, due to unspecified organism [facility])   [facility] Glean Hess, MD   2 years ago Annual physical exam   [facility] Glean Hess, MD       Future Appointments             In 6 days Glean Hess, MD [facility], [facility]

## 2020-07-31 NOTE — Telephone Encounter (Signed)
Call to patient- appointment scheduled.

## 2020-08-05 ENCOUNTER — Other Ambulatory Visit: Payer: Self-pay | Admitting: Nephrology

## 2020-08-05 DIAGNOSIS — E1122 Type 2 diabetes mellitus with diabetic chronic kidney disease: Secondary | ICD-10-CM | POA: Diagnosis not present

## 2020-08-05 DIAGNOSIS — E872 Acidosis: Secondary | ICD-10-CM | POA: Diagnosis not present

## 2020-08-05 DIAGNOSIS — N182 Chronic kidney disease, stage 2 (mild): Secondary | ICD-10-CM | POA: Diagnosis not present

## 2020-08-05 DIAGNOSIS — I10 Essential (primary) hypertension: Secondary | ICD-10-CM | POA: Diagnosis not present

## 2020-08-05 DIAGNOSIS — E875 Hyperkalemia: Secondary | ICD-10-CM | POA: Diagnosis not present

## 2020-08-06 ENCOUNTER — Ambulatory Visit (INDEPENDENT_AMBULATORY_CARE_PROVIDER_SITE_OTHER): Payer: 59 | Admitting: Internal Medicine

## 2020-08-06 ENCOUNTER — Other Ambulatory Visit: Payer: Self-pay

## 2020-08-06 ENCOUNTER — Encounter: Payer: Self-pay | Admitting: Internal Medicine

## 2020-08-06 VITALS — BP 146/76 | HR 69 | Temp 98.1°F | Ht 65.0 in

## 2020-08-06 DIAGNOSIS — Z23 Encounter for immunization: Secondary | ICD-10-CM

## 2020-08-06 DIAGNOSIS — R238 Other skin changes: Secondary | ICD-10-CM

## 2020-08-06 DIAGNOSIS — E1169 Type 2 diabetes mellitus with other specified complication: Secondary | ICD-10-CM | POA: Diagnosis not present

## 2020-08-06 DIAGNOSIS — I10 Essential (primary) hypertension: Secondary | ICD-10-CM | POA: Diagnosis not present

## 2020-08-06 DIAGNOSIS — E785 Hyperlipidemia, unspecified: Secondary | ICD-10-CM

## 2020-08-06 DIAGNOSIS — N2589 Other disorders resulting from impaired renal tubular function: Secondary | ICD-10-CM | POA: Diagnosis not present

## 2020-08-06 DIAGNOSIS — E559 Vitamin D deficiency, unspecified: Secondary | ICD-10-CM | POA: Diagnosis not present

## 2020-08-06 DIAGNOSIS — E118 Type 2 diabetes mellitus with unspecified complications: Secondary | ICD-10-CM

## 2020-08-06 NOTE — Progress Notes (Signed)
Date:  08/06/2020   Name:  Maria Singh   DOB:  14-Nov-1950   MRN:  [id number]   Chief Complaint: Hypertension (follow up / [id number] ) and Diabetes  Hypertension This is a chronic problem. The problem is unchanged. The problem is resistant (since stopping ARB due to elevated K). Pertinent negatives include no chest pain, headaches, palpitations or shortness of breath. There are no associated agents to hypertension. Past treatments include diuretics and calcium channel blockers. The current treatment provides moderate improvement. There are no compliance problems.  Hypertensive end-organ damage includes kidney disease.  Diabetes She presents for her follow-up diabetic visit. She has type 2 diabetes mellitus. Her disease course has been stable. Pertinent negatives for hypoglycemia include no dizziness, headaches or nervousness/anxiousness. Pertinent negatives for diabetes include no chest pain and no fatigue. Current diabetic treatment includes intensive insulin program (victoza and basaglar). She is compliant with treatment all of the time. An ACE inhibitor/angiotensin II receptor blocker is contraindicated.  Hyperlipidemia This is a chronic problem. The problem is controlled. Pertinent negatives include no chest pain or shortness of breath. Current antihyperlipidemic treatment includes statins. The current treatment provides significant improvement of lipids.  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 Had labs yesterday.  BP remains slightly elevated but he did not want to change her medication. Rash - since the summer has developed random blisters on arms, legs and back of neck after sun exposure.  They blister occurs then quickly sloughs off.  The lesions are not painful and appear to heal quickly.  Lab Results  Component Value Date   CREATININE 0.89 03/20/2020   BUN 27 03/20/2020   NA 141 03/20/2020   K 4.7 03/20/2020   CL 105 03/20/2020   CO2 20 03/20/2020   Lab Results    Component Value Date   CHOL 152 05/17/2019   HDL 26 (L) 05/17/2019   LDLCALC 69 05/17/2019   TRIG 286 (H) 05/17/2019   CHOLHDL 5.8 (H) 05/17/2019   Lab Results  Component Value Date   TSH 5.990 (H) 05/17/2019   Lab Results  Component Value Date   HGBA1C 6.1 (H) 12/07/2019   Lab Results  Component Value Date   WBC 7.3 12/08/2019   HGB 11.1 (L) 12/08/2019   HCT 35.1 (L) 12/08/2019   MCV 89.1 12/08/2019   PLT 175 12/08/2019   Lab Results  Component Value Date   ALT 32 05/17/2019   AST 28 05/17/2019   ALKPHOS 28 (L) 05/17/2019   BILITOT 0.2 05/17/2019     Review of Systems  Constitutional: Negative for chills, fatigue and fever.  HENT: Negative for trouble swallowing.   Respiratory: Negative for cough, chest tightness and shortness of breath.   Cardiovascular: Negative for chest pain, palpitations and leg swelling.  Gastrointestinal: Negative for abdominal pain and blood in stool.  Genitourinary: Negative for dysuria and hematuria.  Musculoskeletal: Positive for arthralgias and gait problem.  Skin: Positive for color change and rash.  Neurological: Positive for numbness. Negative for dizziness and headaches.  Hematological: Negative for adenopathy.  Psychiatric/Behavioral: Negative for dysphoric mood and sleep disturbance. The patient is not nervous/anxious.     Patient Active Problem List   Diagnosis Date Noted  . 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 Rhabdomyolysis 12/06/2019  . Hyperkalemia 12/06/2019  . Morbid obesity with BMI of 50.0-59.9, adult ([place]) 12/06/2019  . Ambulatory dysfunction 12/06/2019  . Type II diabetes mellitus with complication ([place]) 58/83/2549  . Iron deficiency  anemia 12/13/2018  . OSA on CPAP 11/25/2018  . GERD (gastroesophageal reflux disease) 04/13/2018  . Hyperlipidemia associated with type 2 diabetes mellitus ([place]) 08/10/2017  . Essential hypertension 08/10/2017  . 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 Pain in  limb 10/30/2016  . Swelling of limb 10/30/2016    Allergies  Allergen Reactions  . Metronidazole And Related Nausea Only    Mainly oral  . Sulfa Antibiotics Rash    Past Surgical History:  Procedure Laterality Date  . arm surgery Left    broken arm  . HERNIA REPAIR      Social History   Tobacco Use  . Smoking status: Never Smoker  . Smokeless tobacco: Never Used  Vaping Use  . Vaping Use: Never used  Substance Use Topics  . Alcohol use: Yes    Alcohol/week: 0.0 standard drinks    Comment: very very rare  . Drug use: No     Medication list has been reviewed and updated.  Current Meds  Medication Sig  . Accu-Chek FastClix Lancets MISC TEST ONCE DAILY  . acetaminophen (TYLENOL) 500 MG tablet Take 500 mg by mouth every 6 (six) hours as needed.  Marland Kitchen amLODipine (NORVASC) 5 MG tablet Take 1 tablet (5 mg total) by mouth daily.  Marland Kitchen aspirin 81 MG tablet Take 81 mg by mouth daily.  . clotrimazole-betamethasone (LOTRISONE) cream Apply 1 application topically 2 (two) times daily. (Patient taking differently: Apply 1 application topically as needed. )  . docusate sodium (COLACE) 100 MG capsule Take 100 mg by mouth daily.   . ferrous sulfate 325 (65 FE) MG tablet Take 1 tablet (325 mg total) by mouth daily with breakfast.  . FREESTYLE LITE test strip CHECK BLOOD SUGAR TWICE A DAY  . HUMALOG KWIKPEN 100 UNIT/ML KwikPen INJECT 22 UNITS TOTAL INTO THE SKIN 3 THREE TIMES DAILY.  . hydrochlorothiazide (HYDRODIURIL) 25 MG tablet TAKE 1 TABLET BY MOUTH DAILY.  Marland Kitchen ibuprofen (ADVIL) 800 MG tablet TAKE 1 TABLET BY MOUTH 3 TIMES DAILY (Patient taking differently: as needed. )  . Insulin Glargine (BASAGLAR KWIKPEN) 100 UNIT/ML INJECT 60 UNITS IN THE MORNING AND 50 UNITS IN THE EVENING.  . Insulin Pen Needle (NOVOFINE) 32G X 6 MM MISC USE AS DIRECTED 3 TIMES A DAY  . loratadine (CLARITIN) 10 MG tablet Take 10 mg by mouth daily.  . mupirocin ointment (BACTROBAN) 2 % Apply to affected area as  needed.  . NON FORMULARY Auto CPap nightly  . nystatin cream (MYCOSTATIN) APPLY TO THE AFFECTED AREA(S) TWO TIMES DAILY (Patient taking differently: as needed. )  . rosuvastatin (CRESTOR) 5 MG tablet Take 1 tablet (5 mg total) by mouth daily.  . sertraline (ZOLOFT) 100 MG tablet Take 1 tablet (100 mg total) by mouth daily.  . sodium bicarbonate 650 MG tablet Take 1 tablet by mouth in the morning, at noon, in the evening, and at bedtime.  . VELTASSA 8.4 g packet Take 1 packet by mouth daily.  Marland Kitchen VICTOZA 18 MG/3ML SOPN INJECT 1.8 MLS (10.8 MG TOTAL) INTO THE SKIN ONCE DAILY    PHQ 2/9 Scores 08/06/2020 12/27/2019 12/25/2019 05/17/2019  PHQ - 2 Score 0 0 - 0  PHQ- 9 Score 0 - - 1  Exception Documentation - - Medical reason -    GAD 7 : Generalized Anxiety Score 08/06/2020  Nervous, Anxious, on Edge 0  Control/stop worrying 0  Worry too much - different things 0  Trouble relaxing 0  Restless 0  Easily annoyed or irritable 0  Afraid - awful might happen 0  Total GAD 7 Score 0  Anxiety Difficulty Not difficult at all    BP Readings from Last 3 Encounters:  08/06/20 (!) 146/76  12/27/19 (!) 160/76  12/25/19 (!) 180/82    Physical Exam Vitals and nursing note reviewed.  Constitutional:      General: She is not in acute distress.    Appearance: She is well-developed. She is obese.  HENT:     Head: Normocephalic and atraumatic.  Cardiovascular:     Rate and Rhythm: Normal rate and regular rhythm.     Heart sounds: No murmur heard.   Pulmonary:     Effort: Pulmonary effort is normal. No respiratory distress.     Breath sounds: No wheezing or rhonchi.  Musculoskeletal:     Cervical back: Normal range of motion.     Right lower leg: No edema.     Left lower leg: No edema.  Lymphadenopathy:     Cervical: No cervical adenopathy.  Skin:    General: Skin is cool and dry.     Findings: Lesion present. No rash.     Comments: Scattered shallow ulcers on arms and legs - healing without  complications.  No new lesions today are seen.  Neurological:     Mental Status: She is alert and oriented to person, place, and time.     Sensory: Sensory deficit (in both LE) present.     Motor: Weakness and atrophy present.     Comments: Ambulates in motorized wheelchair/scooter  Psychiatric:        Behavior: Behavior normal.        Thought Content: Thought content normal.     Wt Readings from Last 3 Encounters:  12/27/19 (!) 305 lb (138.3 kg)  12/06/19 (!) 305 lb (138.3 kg)  11/25/18 (!) 304 lb 15.8 oz (138.3 kg)    BP (!) 146/76 (BP Location: Left Arm, Patient Position: Sitting)   Pulse 69   Temp 98.1 F (36.7 C) (Oral)   Ht 5\' 5"  (1.651 m)   SpO2 97%   BMI 50.75 kg/m   Assessment and Plan: 1. Type II diabetes mellitus with complication (HCC) Clinically stable by exam and report without s/s of hypoglycemia. DM complicated by HTN, RTA. Tolerating medications (basal bolus insulin, Victoza and metformin) well without side effects or other concerns. Continue same regimen - Hemoglobin A1c  2. Hyperlipidemia associated with type 2 diabetes mellitus ([place]) On high intensity statin as appropriate Labs ordered today - Hepatic function panel - Lipid panel  3. Essential hypertension BP running high since stopping Losartan due to hyperkalemia Will try to monitor at home and follow up with Nephrology Consider adding beta blocker if needed  4. Vitamin D deficiency Check level and advise on supplementation - VITAMIN D 25 Hydroxy (Vit-D Deficiency, Fractures)  5. Blisters of multiple sites Has characteristics of a photosensitivity reaction Lesions are not extremely symptomatic and she does not desire Dermatology referral at this time  6. Renal tubular acidosis, type IV Followed by Dr. Holley Raring Renal panel done yesterday  7. Need for vaccination for pneumococcus Second dose due now - Pneumococcal polysaccharide vaccine 23-valent greater than or equal to 2yo  subcutaneous/IM   Partially dictated using Editor, commissioning. Any errors are unintentional.  Halina Maidens, MD [facility]  08/06/2020

## 2020-08-08 DIAGNOSIS — E785 Hyperlipidemia, unspecified: Secondary | ICD-10-CM | POA: Diagnosis not present

## 2020-08-08 DIAGNOSIS — E559 Vitamin D deficiency, unspecified: Secondary | ICD-10-CM | POA: Diagnosis not present

## 2020-08-08 DIAGNOSIS — E118 Type 2 diabetes mellitus with unspecified complications: Secondary | ICD-10-CM | POA: Diagnosis not present

## 2020-08-08 DIAGNOSIS — E1169 Type 2 diabetes mellitus with other specified complication: Secondary | ICD-10-CM | POA: Diagnosis not present

## 2020-08-08 DIAGNOSIS — F4320 Adjustment disorder, unspecified: Secondary | ICD-10-CM | POA: Diagnosis not present

## 2020-08-09 LAB — HEPATIC FUNCTION PANEL
ALT: 40 IU/L — ABNORMAL HIGH (ref 0–32)
AST: 27 IU/L (ref 0–40)
Albumin: 4.3 g/dL (ref 3.8–4.8)
Alkaline Phosphatase: 31 IU/L — ABNORMAL LOW (ref 44–121)
Bilirubin Total: <0.2 mg/dL (ref 0.0–1.2)
Bilirubin, Direct: <0.1 mg/dL (ref 0.00–0.40)
Total Protein: 6.6 g/dL (ref 6.0–8.5)

## 2020-08-09 LAB — VITAMIN D 25 HYDROXY (VIT D DEFICIENCY, FRACTURES): Vit D, 25-Hydroxy: 11.9 ng/mL — ABNORMAL LOW (ref 30.0–100.0)

## 2020-08-09 LAB — HEMOGLOBIN A1C
Est. average glucose Bld gHb Est-mCnc: 126 mg/dL
Hgb A1c MFr Bld: 6 % — ABNORMAL HIGH (ref 4.8–5.6)

## 2020-08-09 LAB — LIPID PANEL
Chol/HDL Ratio: 5.9 ratio — ABNORMAL HIGH (ref 0.0–4.4)
Cholesterol, Total: 165 mg/dL (ref 100–199)
HDL: 28 mg/dL — ABNORMAL LOW (ref >39)
LDL Chol Calc (NIH): 76 mg/dL (ref 0–99)
Triglycerides: 378 mg/dL — ABNORMAL HIGH (ref 0–149)
VLDL Cholesterol Cal: 61 mg/dL — ABNORMAL HIGH (ref 5–40)

## 2020-08-15 ENCOUNTER — Telehealth: Payer: Self-pay | Admitting: General Practice

## 2020-08-15 NOTE — Telephone Encounter (Signed)
  Chronic Care Management   Outreach Note  08/15/2020 Name: Maria Singh MRN: [id number] DOB: 03/06/1951  Referred by: Glean Hess, MD Reason for referral : Chronic Care Management (RNCM Chronic Disease Mangerment and Care Coordination Needs)   An unsuccessful telephone outreach was attempted today. The patient was referred to the case management team for assistance with care management and care coordination.   Follow Up Plan: A HIPAA compliant phone message was left for the patient providing contact information and requesting a return call.   Noreene Larsson RN, MSN, [place] [facility] Mobile: [phone]

## 2020-09-05 ENCOUNTER — Other Ambulatory Visit: Payer: Self-pay | Admitting: Internal Medicine

## 2020-09-05 DIAGNOSIS — F329 Major depressive disorder, single episode, unspecified: Secondary | ICD-10-CM

## 2020-09-11 ENCOUNTER — Telehealth: Payer: Self-pay | Admitting: General Practice

## 2020-09-11 NOTE — Telephone Encounter (Signed)
  Chronic Care Management   Outreach Note  09/11/2020 Name: Maria Singh MRN: [id number] DOB: 1951-06-22  Referred by: Glean Hess, MD Reason for referral : Chronic Care Management (RNCM Chronic Disease Management and Care Coordination  Needs-2nd attempt)   A second unsuccessful telephone outreach was attempted today. The patient was referred to the case management team for assistance with care management and care coordination.   Follow Up Plan: A HIPAA compliant phone message was left for the patient providing contact information and requesting a return call.   Noreene Larsson RN, MSN, [place] [facility] Mobile: [phone]

## 2020-09-13 ENCOUNTER — Other Ambulatory Visit: Payer: Self-pay | Admitting: Internal Medicine

## 2020-09-13 DIAGNOSIS — E109 Type 1 diabetes mellitus without complications: Secondary | ICD-10-CM

## 2020-09-13 DIAGNOSIS — E782 Mixed hyperlipidemia: Secondary | ICD-10-CM

## 2020-09-13 DIAGNOSIS — E1165 Type 2 diabetes mellitus with hyperglycemia: Secondary | ICD-10-CM

## 2020-09-13 NOTE — Telephone Encounter (Signed)
Requested medication (s) are due for refill today: yes  Requested medication (s) are on the active medication list: yes  Last refill: 09/08/19 #270 3 refills  end date 12/25/19  Future visit scheduled:No  Notes to clinic: Rx has expired    Requested Prescriptions  Pending Prescriptions Disp Refills   metFORMIN (GLUCOPHAGE) 1000 MG tablet [Pharmacy Med Name: metFORMIN HCL 1000 MG TABS 1000 Tablet] 180 tablet 3    Sig: TAKE 1 TABLET BY MOUTH 2 TIMES DAILY.      Endocrinology:  Diabetes - Biguanides Passed - 09/13/2020 12:14 PM      Passed - Cr in normal range and within 360 days    Creatinine  Date Value Ref Range Status  08/11/2013 1.08 0.60 - 1.30 mg/dL Final   Creatinine, Ser  Date Value Ref Range Status  03/20/2020 0.89 0.57 - 1.00 mg/dL Final   Creatinine, Urine  Date Value Ref Range Status  12/07/2019 21 mg/dL Final    Comment:    Performed at [facility], [address] [place], [place] [address]          Passed - HBA1C is between 0 and 7.9 and within 180 days    Hemoglobin A1C  Date Value Ref Range Status  08/09/2013 7.2 (H) 4.2 - 6.3 % Final    Comment:    The American Diabetes Association recommends that a primary goal of therapy should be <7% and that physicians should reevaluate the treatment regimen in patients with HbA1c values consistently >8%.    Hgb A1c MFr Bld  Date Value Ref Range Status  08/08/2020 6.0 (H) 4.8 - 5.6 % Final    Comment:             Prediabetes: 5.7 - 6.4          Diabetes: >6.4          Glycemic control for adults with diabetes: <7.0           Passed - eGFR in normal range and within 360 days    EGFR (African American)  Date Value Ref Range Status  08/11/2013 >60  Final   GFR calc Af Amer  Date Value Ref Range Status  03/20/2020 78 >59 mL/min/1.73 Final    Comment:    **[facility] currently reports eGFR in compliance with the current**   recommendations of the [employer]. [facility] will   update  reporting as new guidelines are published from the NKF-ASN   Task force.    EGFR (Non-African Amer.)  Date Value Ref Range Status  08/11/2013 55 (L)  Final    Comment:    eGFR values <54m/min/1.73 m2 may be an indication of chronic kidney disease (CKD). Calculated eGFR is useful in patients with stable renal function. The eGFR calculation will not be reliable in acutely ill patients when serum creatinine is changing rapidly. It is not useful in  patients on dialysis. The eGFR calculation may not be applicable to patients at the low and high extremes of body sizes, pregnant women, and vegetarians.    GFR calc non Af Amer  Date Value Ref Range Status  03/20/2020 67 >59 mL/min/1.73 Final          Passed - Valid encounter within last 6 months    Recent Outpatient Visits           1 month ago Type II diabetes mellitus with complication ([facility]   8 months  ago Hyperkalemia   [facility] Glean Hess, MD   1 year ago Annual physical exam   [facility] Glean Hess, [facility]   1 year ago Type 2 diabetes mellitus with diabetic dermatitis, with long-term current use of insulin [facility])   [facility] Glean Hess, MD   2 years ago Sepsis without acute organ dysfunction, due to unspecified organism [facility])   [facility] Glean Hess, MD               Signed Prescriptions Disp Refills   rosuvastatin (CRESTOR) 5 MG tablet 90 tablet 3    Sig: TAKE 1 TABLET BY MOUTH DAILY.      Cardiovascular:  Antilipid - Statins Failed - 09/13/2020 12:14 PM      Failed - LDL in normal range and within 360 days    LDL Chol Calc (NIH)  Date Value Ref Range Status  08/08/2020 76 0 - 99 mg/dL Final          Failed - HDL in normal range and within 360 days    HDL  Date Value Ref Range Status  08/08/2020 28 (L) >39 mg/dL Final          Failed - Triglycerides in normal range and within 360 days     Triglycerides  Date Value Ref Range Status  08/08/2020 378 (H) 0 - 149 mg/dL Final          Passed - Total Cholesterol in normal range and within 360 days    Cholesterol, Total  Date Value Ref Range Status  08/08/2020 165 100 - 199 mg/dL Final          Passed - Patient is not pregnant      Passed - Valid encounter within last 12 months    Recent Outpatient Visits           1 month ago Type II diabetes mellitus with complication [facility])   [facility] Glean Hess, MD   8 months ago Hyperkalemia   [facility] Glean Hess, MD   1 year ago Annual physical exam   [facility] Glean Hess, MD   1 year ago Type 2 diabetes mellitus with diabetic dermatitis, with long-term current use of insulin [facility])   [facility] Glean Hess, MD   2 years ago Sepsis without acute organ dysfunction, due to unspecified organism [facility])   [facility] Glean Hess, MD

## 2020-09-13 NOTE — Telephone Encounter (Signed)
Requested Prescriptions  Pending Prescriptions Disp Refills   rosuvastatin (CRESTOR) 5 MG tablet [Pharmacy Med Name: ROSUVASTATIN CALCIUM 5 MG T 5 Tablet] 90 tablet 3    Sig: TAKE 1 TABLET BY MOUTH DAILY.     Cardiovascular:  Antilipid - Statins Failed - 09/13/2020 12:14 PM      Failed - LDL in normal range and within 360 days    LDL Chol Calc (NIH)  Date Value Ref Range Status  08/08/2020 76 0 - 99 mg/dL Final         Failed - HDL in normal range and within 360 days    HDL  Date Value Ref Range Status  08/08/2020 28 (L) >39 mg/dL Final         Failed - Triglycerides in normal range and within 360 days    Triglycerides  Date Value Ref Range Status  08/08/2020 378 (H) 0 - 149 mg/dL Final         Passed - Total Cholesterol in normal range and within 360 days    Cholesterol, Total  Date Value Ref Range Status  08/08/2020 165 100 - 199 mg/dL Final         Passed - Patient is not pregnant      Passed - Valid encounter within last 12 months    Recent Outpatient Visits          1 month ago Type II diabetes mellitus with complication [facility])   [facility] Glean Hess, MD   8 months ago Hyperkalemia   [facility] Glean Hess, MD   1 year ago Annual physical exam   [facility] Glean Hess, MD   1 year ago Type 2 diabetes mellitus with diabetic dermatitis, with long-term current use of insulin [facility])   [facility] Glean Hess, MD   2 years ago Sepsis without acute organ dysfunction, due to unspecified organism [facility])   [facility] Glean Hess, MD              metFORMIN (GLUCOPHAGE) 1000 MG tablet [Pharmacy Med Name: metFORMIN HCL 1000 MG TABS 1000 Tablet] 180 tablet 3    Sig: TAKE 1 TABLET BY MOUTH 2 TIMES DAILY.     Endocrinology:  Diabetes - Biguanides Passed - 09/13/2020 12:14 PM      Passed - Cr in normal range and within 360 days    Creatinine  Date Value Ref Range Status  08/11/2013  1.08 0.60 - 1.30 mg/dL Final   Creatinine, Ser  Date Value Ref Range Status  03/20/2020 0.89 0.57 - 1.00 mg/dL Final   Creatinine, Urine  Date Value Ref Range Status  12/07/2019 21 mg/dL Final    Comment:    Performed at [facility], [address], [place], [place] [address]         Passed - HBA1C is between 0 and 7.9 and within 180 days    Hemoglobin A1C  Date Value Ref Range Status  08/09/2013 7.2 (H) 4.2 - 6.3 % Final    Comment:    The American Diabetes Association recommends that a primary goal of therapy should be <7% and that physicians should reevaluate the treatment regimen in patients with HbA1c values consistently >8%.    Hgb A1c MFr Bld  Date Value Ref Range Status  08/08/2020 6.0 (H) 4.8 - 5.6 % Final    Comment:             Prediabetes:  5.7 - 6.4          Diabetes: >6.4          Glycemic control for adults with diabetes: <7.0          Passed - eGFR in normal range and within 360 days    EGFR (African American)  Date Value Ref Range Status  08/11/2013 >60  Final   GFR calc Af Amer  Date Value Ref Range Status  03/20/2020 78 >59 mL/min/1.73 Final    Comment:    **[facility] currently reports eGFR in compliance with the current**   recommendations of the [employer]. [facility] will   update reporting as new guidelines are published from the NKF-ASN   Task force.    EGFR (Non-African Amer.)  Date Value Ref Range Status  08/11/2013 55 (L)  Final    Comment:    eGFR values <19m/min/1.73 m2 may be an indication of chronic kidney disease (CKD). Calculated eGFR is useful in patients with stable renal function. The eGFR calculation will not be reliable in acutely ill patients when serum creatinine is changing rapidly. It is not useful in  patients on dialysis. The eGFR calculation may not be applicable to patients at the low and high extremes of body sizes, pregnant women, and vegetarians.    GFR calc non Af Amer  Date Value  Ref Range Status  03/20/2020 67 >59 mL/min/1.73 Final         Passed - Valid encounter within last 6 months    Recent Outpatient Visits          1 month ago Type II diabetes mellitus with complication ([facility]   [facility]   8 months [facility]   1 year [facility]   1 year ago Type 2 diabetes mellitus with diabetic dermatitis, with long-term current use of insulin ([facility]   [facility]   2 years ago Sepsis without acute organ dysfunction, due to unspecified organism ([facility]   [facility]

## 2020-09-16 ENCOUNTER — Other Ambulatory Visit: Payer: Self-pay | Admitting: Internal Medicine

## 2020-09-19 DIAGNOSIS — F4320 Adjustment disorder, unspecified: Secondary | ICD-10-CM | POA: Diagnosis not present

## 2020-09-27 DIAGNOSIS — S46312A Strain of muscle, fascia and tendon of triceps, left arm, initial encounter: Secondary | ICD-10-CM | POA: Diagnosis not present

## 2020-09-27 DIAGNOSIS — E118 Type 2 diabetes mellitus with unspecified complications: Secondary | ICD-10-CM | POA: Diagnosis not present

## 2020-09-27 DIAGNOSIS — M25522 Pain in left elbow: Secondary | ICD-10-CM | POA: Diagnosis not present

## 2020-10-01 ENCOUNTER — Ambulatory Visit (INDEPENDENT_AMBULATORY_CARE_PROVIDER_SITE_OTHER): Payer: 59 | Admitting: Internal Medicine

## 2020-10-01 ENCOUNTER — Other Ambulatory Visit: Payer: Self-pay

## 2020-10-01 ENCOUNTER — Other Ambulatory Visit: Payer: Self-pay | Admitting: Internal Medicine

## 2020-10-01 ENCOUNTER — Encounter: Payer: Self-pay | Admitting: Internal Medicine

## 2020-10-01 VITALS — BP 140/68 | HR 74 | Temp 98.3°F | Ht 65.0 in

## 2020-10-01 DIAGNOSIS — R399 Unspecified symptoms and signs involving the genitourinary system: Secondary | ICD-10-CM | POA: Diagnosis not present

## 2020-10-01 DIAGNOSIS — E118 Type 2 diabetes mellitus with unspecified complications: Secondary | ICD-10-CM

## 2020-10-01 DIAGNOSIS — R531 Weakness: Secondary | ICD-10-CM | POA: Diagnosis not present

## 2020-10-01 DIAGNOSIS — E559 Vitamin D deficiency, unspecified: Secondary | ICD-10-CM | POA: Diagnosis not present

## 2020-10-01 DIAGNOSIS — G600 Hereditary motor and sensory neuropathy: Secondary | ICD-10-CM

## 2020-10-01 LAB — POCT URINALYSIS DIPSTICK
Bilirubin, UA: NEGATIVE
Blood, UA: NEGATIVE
Glucose, UA: NEGATIVE
Ketones, UA: NEGATIVE
Leukocytes, UA: NEGATIVE
Nitrite, UA: NEGATIVE
Protein, UA: NEGATIVE
Spec Grav, UA: 1.015 (ref 1.010–1.025)
Urobilinogen, UA: 0.2 E.U./dL
pH, UA: 5 (ref 5.0–8.0)

## 2020-10-01 NOTE — Progress Notes (Signed)
Date:  10/01/2020   Name:  Maria Singh   DOB:  08-30-1951   MRN:  [id number]   Chief Complaint: Urinary Tract Infection (No symptoms )  Urinary Tract Infection  This is a new (concern for UTI since much weaker the past few days) problem. The patient is experiencing no pain. There has been no fever. Pertinent negatives include no chills, flank pain, frequency, hematuria, nausea or sweats. She has tried nothing for the symptoms.  Extremity Weakness  The pain is present in the left arm. This is a new problem. The current episode started 1 to 4 weeks ago. The problem has been gradually improving. The quality of the pain is described as aching. The pain is at a severity of 1/10. The pain is mild (due to partial biceps tear). Pertinent negatives include no fever. She has tried cold, heat and OTC pain meds for the symptoms. The treatment provided mild relief.  Elevated TG - taking vascepa without side effects.  Needs more samples to continue. Vitamin D def - Vitamin D was very low - recommended supplementation.    Lab Results  Component Value Date   CREATININE 0.89 03/20/2020   BUN 27 03/20/2020   NA 141 03/20/2020   K 4.7 03/20/2020   CL 105 03/20/2020   CO2 20 03/20/2020   Lab Results  Component Value Date   CHOL 165 08/08/2020   HDL 28 (L) 08/08/2020   LDLCALC 76 08/08/2020   TRIG 378 (H) 08/08/2020   CHOLHDL 5.9 (H) 08/08/2020   Lab Results  Component Value Date   TSH 5.990 (H) 05/17/2019   Lab Results  Component Value Date   HGBA1C 6.0 (H) 08/08/2020   Lab Results  Component Value Date   WBC 7.3 12/08/2019   HGB 11.1 (L) 12/08/2019   HCT 35.1 (L) 12/08/2019   MCV 89.1 12/08/2019   PLT 175 12/08/2019   Lab Results  Component Value Date   ALT 40 (H) 08/08/2020   AST 27 08/08/2020   ALKPHOS 31 (L) 08/08/2020   BILITOT <0.2 08/08/2020     Review of Systems  Constitutional: Negative for chills, fatigue and fever.  Respiratory: Negative for chest tightness  and shortness of breath.   Cardiovascular: Negative for chest pain and leg swelling.  Gastrointestinal: Negative for nausea.  Genitourinary: Negative for flank pain, frequency and hematuria.  Musculoskeletal: Positive for extremity weakness and gait problem (having more trouble tranferring).  Neurological: Positive for weakness.    Patient Active Problem List   Diagnosis Date Noted  . 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 Rhabdomyolysis 12/06/2019  . Hyperkalemia 12/06/2019  . Morbid obesity with BMI of 50.0-59.9, adult ([place]) 12/06/2019  . Ambulatory dysfunction 12/06/2019  . Type II diabetes mellitus with complication ([place]) 75/64/3329  . Iron deficiency anemia 12/13/2018  . OSA on CPAP 11/25/2018  . GERD (gastroesophageal reflux disease) 04/13/2018  . Hyperlipidemia associated with type 2 diabetes mellitus ([place]) 08/10/2017  . Essential hypertension 08/10/2017  . 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 Pain in limb 10/30/2016  . Swelling of limb 10/30/2016    Allergies  Allergen Reactions  . Metronidazole And Related Nausea Only    Mainly oral  . Sulfa Antibiotics Rash, Hives and Itching    Past Surgical History:  Procedure Laterality Date  . arm surgery Left    broken arm  . HERNIA REPAIR      Social History   Tobacco Use  . Smoking status:  Never Smoker  . Smokeless tobacco: Never Used  Vaping Use  . Vaping Use: Never used  Substance Use Topics  . Alcohol use: Yes    Alcohol/week: 0.0 standard drinks    Comment: very very rare  . Drug use: No     Medication list has been reviewed and updated.  Current Meds  Medication Sig  . Accu-Chek FastClix Lancets MISC TEST ONCE DAILY  . acetaminophen (TYLENOL) 500 MG tablet Take 500 mg by mouth every 6 (six) hours as needed.  Marland Kitchen amLODipine (NORVASC) 5 MG tablet Take 1 tablet (5 mg total) by mouth daily.  Marland Kitchen aspirin 81 MG tablet Take 81 mg by mouth daily.  . clotrimazole-betamethasone (LOTRISONE)  cream Apply 1 application topically 2 (two) times daily. (Patient taking differently: Apply 1 application topically as needed.)  . docusate sodium (COLACE) 100 MG capsule Take 100 mg by mouth daily.   . ferrous sulfate 325 (65 FE) MG tablet Take 1 tablet (325 mg total) by mouth daily with breakfast.  . FREESTYLE LITE test strip CHECK BLOOD SUGAR TWICE A DAY  . HUMALOG KWIKPEN 100 UNIT/ML KwikPen INJECT 22 UNITS TOTAL INTO THE SKIN 3 THREE TIMES DAILY.  . hydrochlorothiazide (HYDRODIURIL) 25 MG tablet TAKE 1 TABLET BY MOUTH DAILY.  Marland Kitchen ibuprofen (ADVIL) 800 MG tablet TAKE 1 TABLET BY MOUTH 3 TIMES DAILY (Patient taking differently: as needed.)  . Insulin Glargine (BASAGLAR KWIKPEN) 100 UNIT/ML INJECT 60 UNITS IN THE MORNING AND 50 UNITS IN THE EVENING.  . Insulin Pen Needle (NOVOFINE) 32G X 6 MM MISC USE AS DIRECTED 3 TIMES A DAY  . loratadine (CLARITIN) 10 MG tablet Take 10 mg by mouth daily.  . metFORMIN (GLUCOPHAGE) 1000 MG tablet TAKE 1 TABLET BY MOUTH 2 TIMES DAILY.  . mupirocin ointment (BACTROBAN) 2 % Apply to affected area as needed.  . NON FORMULARY Auto CPap nightly  . nystatin cream (MYCOSTATIN) APPLY TO THE AFFECTED AREA(S) TWO TIMES DAILY (Patient taking differently: as needed.)  . rosuvastatin (CRESTOR) 5 MG tablet TAKE 1 TABLET BY MOUTH DAILY.  Marland Kitchen sertraline (ZOLOFT) 100 MG tablet TAKE 1 TABLET BY MOUTH DAILY.  . sodium bicarbonate 650 MG tablet Take 1 tablet by mouth in the morning and at bedtime.  . VELTASSA 8.4 g packet Take 1 packet by mouth daily.  Marland Kitchen VICTOZA 18 MG/3ML SOPN INJECT 1.8 MLS (10.8 MG TOTAL) INTO THE SKIN ONCE DAILY    PHQ 2/9 Scores 10/01/2020 08/06/2020 12/27/2019 12/25/2019  PHQ - 2 Score 0 0 0 -  PHQ- 9 Score 0 0 - -  Exception Documentation - - - Medical reason    GAD 7 : Generalized Anxiety Score 10/01/2020 08/06/2020  Nervous, Anxious, on Edge 0 0  Control/stop worrying 0 0  Worry too much - different things 0 0  Trouble relaxing 0 0  Restless 0 0   Easily annoyed or irritable 0 0  Afraid - awful might happen 0 0  Total GAD 7 Score 0 0  Anxiety Difficulty - Not difficult at all    BP Readings from Last 3 Encounters:  10/01/20 140/68  08/06/20 (!) 146/76  12/27/19 (!) 160/76    Physical Exam Cardiovascular:     Rate and Rhythm: Normal rate and regular rhythm.     Pulses: Normal pulses.     Heart sounds: No murmur heard.   Pulmonary:     Effort: Pulmonary effort is normal.     Breath sounds: No wheezing or rhonchi.  Musculoskeletal:  Right upper arm: Normal.     Left upper arm: Tenderness (over biceps) present. No swelling or deformity.     Cervical back: Normal range of motion.     Right lower leg: No edema.     Left lower leg: No edema.  Lymphadenopathy:     Cervical: No cervical adenopathy.  Neurological:     Mental Status: She is alert and oriented to person, place, and time. Mental status is at baseline.     Motor: Weakness present.     Wt Readings from Last 3 Encounters:  12/27/19 (!) 305 lb (138.3 kg)  12/06/19 (!) 305 lb (138.3 kg)  11/25/18 (!) 304 lb 15.8 oz (138.3 kg)    BP 140/68   Pulse 74   Temp 98.3 F (36.8 C) (Oral)   Ht 5\' 5"  (1.651 m)   SpO2 96%   BMI 50.75 kg/m   Assessment and Plan: 1. Type II diabetes mellitus with complication (HCC) Clinically stable by exam and report without s/s of hypoglycemia. DM complicated by obesity. Tolerating medications well without side effects or other concerns. Continue Vascepa - Microalbumin / creatinine urine ratio - Lipid panel  2. Charcot Marie Tooth muscular atrophy  3. Weakness generalized Rule out early rhabdo - Basic metabolic panel - CK (Creatine Kinase)  4. Vitamin D deficiency Supplementing - check levels for improvement - VITAMIN D 25 Hydroxy (Vit-D Deficiency, Fractures)  5. UTI symptoms UA negative - POCT urinalysis dipstick   Partially dictated using Editor, commissioning. Any errors are unintentional.  Halina Maidens,  MD [facility]  10/01/2020

## 2020-10-02 LAB — BASIC METABOLIC PANEL
BUN/Creatinine Ratio: 38 — ABNORMAL HIGH (ref 12–28)
BUN: 35 mg/dL — ABNORMAL HIGH (ref 8–27)
CO2: 20 mmol/L (ref 20–29)
Calcium: 9.6 mg/dL (ref 8.7–10.3)
Chloride: 106 mmol/L (ref 96–106)
Creatinine, Ser: 0.91 mg/dL (ref 0.57–1.00)
GFR calc Af Amer: 75 mL/min/{1.73_m2} (ref >59)
GFR calc non Af Amer: 65 mL/min/{1.73_m2} (ref >59)
Glucose: 71 mg/dL (ref 65–99)
Potassium: 4.4 mmol/L (ref 3.5–5.2)
Sodium: 142 mmol/L (ref 134–144)

## 2020-10-02 LAB — VITAMIN D 25 HYDROXY (VIT D DEFICIENCY, FRACTURES): Vit D, 25-Hydroxy: 18.8 ng/mL — ABNORMAL LOW (ref 30.0–100.0)

## 2020-10-02 LAB — LIPID PANEL
Chol/HDL Ratio: 6.1 ratio — ABNORMAL HIGH (ref 0.0–4.4)
Cholesterol, Total: 178 mg/dL (ref 100–199)
HDL: 29 mg/dL — ABNORMAL LOW (ref >39)
LDL Chol Calc (NIH): 89 mg/dL (ref 0–99)
Triglycerides: 363 mg/dL — ABNORMAL HIGH (ref 0–149)
VLDL Cholesterol Cal: 60 mg/dL — ABNORMAL HIGH (ref 5–40)

## 2020-10-02 LAB — MICROALBUMIN / CREATININE URINE RATIO
Creatinine, Urine: 48.6 mg/dL
Microalb/Creat Ratio: 28 mg/g creat (ref 0–29)
Microalbumin, Urine: 13.8 ug/mL

## 2020-10-02 LAB — CK: Total CK: 531 U/L (ref 32–182)

## 2020-10-03 DIAGNOSIS — F4320 Adjustment disorder, unspecified: Secondary | ICD-10-CM | POA: Diagnosis not present

## 2020-10-07 ENCOUNTER — Other Ambulatory Visit: Payer: Self-pay | Admitting: Internal Medicine

## 2020-10-07 DIAGNOSIS — R531 Weakness: Secondary | ICD-10-CM

## 2020-10-29 ENCOUNTER — Other Ambulatory Visit: Payer: Self-pay | Admitting: Internal Medicine

## 2020-11-06 ENCOUNTER — Other Ambulatory Visit: Payer: Self-pay | Admitting: Family Medicine

## 2020-11-06 ENCOUNTER — Other Ambulatory Visit: Payer: Self-pay | Admitting: Internal Medicine

## 2020-11-18 DIAGNOSIS — G600 Hereditary motor and sensory neuropathy: Secondary | ICD-10-CM | POA: Diagnosis not present

## 2020-11-18 DIAGNOSIS — E118 Type 2 diabetes mellitus with unspecified complications: Secondary | ICD-10-CM | POA: Diagnosis not present

## 2020-12-06 ENCOUNTER — Other Ambulatory Visit: Payer: Self-pay | Admitting: Internal Medicine

## 2020-12-06 DIAGNOSIS — I10 Essential (primary) hypertension: Secondary | ICD-10-CM

## 2020-12-06 NOTE — Telephone Encounter (Signed)
No future visit

## 2020-12-10 DIAGNOSIS — E119 Type 2 diabetes mellitus without complications: Secondary | ICD-10-CM | POA: Diagnosis not present

## 2020-12-10 LAB — HM DIABETES EYE EXAM

## 2020-12-16 ENCOUNTER — Other Ambulatory Visit: Payer: Self-pay | Admitting: Internal Medicine

## 2020-12-16 DIAGNOSIS — E109 Type 1 diabetes mellitus without complications: Secondary | ICD-10-CM

## 2020-12-24 DIAGNOSIS — I10 Essential (primary) hypertension: Secondary | ICD-10-CM | POA: Diagnosis not present

## 2020-12-24 DIAGNOSIS — E875 Hyperkalemia: Secondary | ICD-10-CM | POA: Diagnosis not present

## 2020-12-24 DIAGNOSIS — N182 Chronic kidney disease, stage 2 (mild): Secondary | ICD-10-CM | POA: Diagnosis not present

## 2020-12-24 DIAGNOSIS — E872 Acidosis: Secondary | ICD-10-CM | POA: Diagnosis not present

## 2020-12-24 DIAGNOSIS — E1122 Type 2 diabetes mellitus with diabetic chronic kidney disease: Secondary | ICD-10-CM | POA: Diagnosis not present

## 2020-12-31 ENCOUNTER — Encounter: Payer: Self-pay | Admitting: Internal Medicine

## 2021-01-01 ENCOUNTER — Ambulatory Visit: Payer: 59

## 2021-01-06 ENCOUNTER — Ambulatory Visit (INDEPENDENT_AMBULATORY_CARE_PROVIDER_SITE_OTHER): Payer: 59

## 2021-01-06 DIAGNOSIS — Z0000 Encounter for general adult medical examination without abnormal findings: Secondary | ICD-10-CM | POA: Diagnosis not present

## 2021-01-06 DIAGNOSIS — Z780 Asymptomatic menopausal state: Secondary | ICD-10-CM

## 2021-01-06 DIAGNOSIS — Z1231 Encounter for screening mammogram for malignant neoplasm of breast: Secondary | ICD-10-CM

## 2021-01-06 NOTE — Progress Notes (Signed)
Subjective:   Maria Singh is a 70 y.o. female who presents for Medicare Annual (Subsequent) preventive examination.  Review of Systems     Cardiac Risk Factors include: advanced age (>63men, >26 women);diabetes mellitus;dyslipidemia;hypertension;obesity (BMI >30kg/m2);sedentary lifestyle     Objective:    There were no vitals filed for this visit. There is no height or weight on file to calculate BMI.  Advanced Directives 01/06/2021 12/27/2019 12/07/2019 12/06/2019 11/25/2018 11/25/2018 07/16/2018  Does Patient Have a Medical Advance Directive? Yes Yes Yes No Yes No Yes  Type of Paramedic of [place];Living will [place];Living will [place];Living will - Healthcare Power of [place]  Does patient want to make changes to medical advance directive? - - No - Patient declined - No - Patient declined - No - Patient declined  Copy of [place] in Chart? No - copy requested No - copy requested - - No - copy requested - No - copy requested  Would patient like information on creating a medical advance directive? - - No - Patient declined - No - Patient declined No - Patient declined No - Patient declined    Current Medications (verified) Outpatient Encounter Medications as of 01/06/2021  Medication Sig  . Accu-Chek FastClix Lancets MISC TEST ONCE DAILY  . acetaminophen (TYLENOL) 500 MG tablet Take 500 mg by mouth every 6 (six) hours as needed.  Marland Kitchen amLODipine (NORVASC) 5 MG tablet TAKE 1 TABLET BY MOUTH DAILY.  Marland Kitchen aspirin 81 MG tablet Take 81 mg by mouth daily.  . clotrimazole-betamethasone (LOTRISONE) cream Apply 1 application topically 2 (two) times daily. (Patient taking differently: Apply 1 application topically as needed.)  . docusate sodium (COLACE) 100 MG capsule Take 100 mg by mouth daily.   . ferrous sulfate 325 (65 FE) MG tablet Take 1 tablet (325 mg total) by mouth daily  with breakfast.  . FREESTYLE LITE test strip CHECK BLOOD SUGAR TWICE A DAY  . HUMALOG KWIKPEN 100 UNIT/ML KwikPen INJECT 22 UNITS TOTAL INTO THE SKIN 3 THREE TIMES DAILY.  . hydrochlorothiazide (HYDRODIURIL) 25 MG tablet TAKE 1 TABLET BY MOUTH DAILY.  Marland Kitchen ibuprofen (ADVIL) 800 MG tablet TAKE 1 TABLET BY MOUTH 3 TIMES DAILY (Patient taking differently: as needed.)  . icosapent Ethyl (VASCEPA) 1 g capsule   . Insulin Glargine (BASAGLAR KWIKPEN) 100 UNIT/ML INJECT 60 UNITS UNDER THE SKIN IN THE MORNING AND 50 UNITS IN THE EVENING.  . Insulin Pen Needle (NOVOFINE) 32G X 6 MM MISC USE AS DIRECTED 3 TIMES A DAY  . loratadine (CLARITIN) 10 MG tablet Take 10 mg by mouth daily.  . metFORMIN (GLUCOPHAGE) 1000 MG tablet TAKE 1 TABLET BY MOUTH 2 TIMES DAILY.  . mupirocin ointment (BACTROBAN) 2 % Apply to affected area as needed.  . NON FORMULARY Auto CPap nightly  . nystatin cream (MYCOSTATIN) APPLY TO THE AFFECTED AREA(S) TWO TIMES DAILY (Patient taking differently: as needed.)  . rosuvastatin (CRESTOR) 5 MG tablet TAKE 1 TABLET BY MOUTH DAILY.  Marland Kitchen sertraline (ZOLOFT) 100 MG tablet TAKE 1 TABLET BY MOUTH DAILY.  . sodium bicarbonate 650 MG tablet Take 1 tablet by mouth in the morning and at bedtime.  . VELTASSA 8.4 g packet Take 1 packet by mouth daily.  Marland Kitchen VICTOZA 18 MG/3ML SOPN INJECT 1.8 MLS (10.8 MG TOTAL) INTO THE SKIN ONCE DAILY   No facility-administered encounter medications on file as of 01/06/2021.    Allergies (  verified) Metronidazole and related and Sulfa antibiotics   History: Past Medical History:  Diagnosis Date  . Anemia   . 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 Chronic kidney disease   . Depression   . Diabetes mellitus without complication ([place])   . GERD (gastroesophageal reflux disease)   . Hyperlipidemia   . Hypertension   . Rotator cuff injury Nov. 2012   left arm  . Sepsis ([place]) 07/16/2018  . Sepsis ([place]) 07/16/2018  . Sleep apnea    Past Surgical History:  Procedure  Laterality Date  . arm surgery Left    broken arm  . FRACTURE SURGERY  1998 ?   Left arm  . HERNIA REPAIR     Family History  Problem Relation Age of Onset  . Cancer Mother   . Heart disease Father   . Diabetes Maternal Grandmother   . Hypertension Maternal Grandmother   . Obesity Maternal Grandmother   . Heart disease Paternal Grandfather   . Breast cancer Maternal Aunt        mat great aunt   Social History   Socioeconomic History  . Marital status: Married    Spouse name: Not on file  . Number of children: Not on file  . Years of education: Not on file  . Highest education level: Not on file  Occupational History  . Not on file  Tobacco Use  . Smoking status: Never Smoker  . Smokeless tobacco: Never Used  Vaping Use  . Vaping Use: Never used  Substance and Sexual Activity  . Alcohol use: Not Currently    Alcohol/week: 0.0 standard drinks    Comment: very very rare  . Drug use: Never  . Sexual activity: Not Currently    Birth control/protection: None  Other Topics Concern  . Not on file  Social History Narrative  . Not on file   Social Determinants of Health   Financial Resource Strain: Low Risk   . Difficulty of Paying Living Expenses: Not hard at all  Food Insecurity: No Food Insecurity  . Worried About Charity fundraiser in the Last Year: Never true  . Ran Out of Food in the Last Year: Never true  Transportation Needs: No Transportation Needs  . Lack of Transportation (Medical): No  . Lack of Transportation (Non-Medical): No  Physical Activity: Inactive  . Days of Exercise per Week: 0 days  . Minutes of Exercise per Session: 0 min  Stress: No Stress Concern Present  . Feeling of Stress : Not at all  Social Connections: Unknown  . Frequency of Communication with Friends and Family: Patient refused  . Frequency of Social Gatherings with Friends and Family: Patient refused  . Attends Religious Services: Patient refused  . Active Member of Clubs or  Organizations: Patient refused  . Attends Archivist Meetings: Patient refused  . Marital Status: Married    Tobacco Counseling Counseling given: Not Answered   Clinical Intake:  Pre-visit preparation completed: Yes  Pain : No/denies pain     Nutritional Status: BMI > 30  Obese Nutritional Risks: None Diabetes: Yes CBG done?: No Did pt. bring in CBG monitor from home?: No  How often do you need to have someone help you when you read instructions, pamphlets, or other written materials from your doctor or pharmacy?: 1 - Never  Nutrition Risk Assessment:  Has the patient had any N/V/D within the last 2 months?  No  Does the patient have any non-healing  wounds?  No  Has the patient had any unintentional weight loss or weight gain?  No   Diabetes:  Is the patient diabetic?  Yes  If diabetic, was a CBG obtained today?  No  Did the patient bring in their glucometer from home?  No  How often do you monitor your CBG's? 1-2 times per day .   Financial Strains and Diabetes Management:  Are you having any financial strains with the device, your supplies or your medication? No .  Does the patient want to be seen by Chronic Care Management for management of their diabetes?  No  Would the patient like to be referred to a Nutritionist or for Diabetic Management?  No   Diabetic Exams:  Diabetic Eye Exam: Completed 12/10/20 negative retinopathy Dr. Edison Pace.   Diabetic Foot Exam: Completed 08/06/20.   Interpreter Needed?: No  Information entered by :: Clemetine Marker LPN   Activities of Daily Living In your present state of health, do you have any difficulty performing the following activities: 01/06/2021  Hearing? Y  Comment declines hearing aids  Vision? N  Difficulty concentrating or making decisions? N  Walking or climbing stairs? Y  Dressing or bathing? Y  Doing errands, shopping? Y  Preparing Food and eating ? N  Using the Toilet? N  In the past six months, have  you accidently leaked urine? Y  Do you have problems with loss of bowel control? N  Managing your Medications? N  Managing your Finances? N  Housekeeping or managing your Housekeeping? N  Some recent data might be hidden    Patient Care Team: Glean Hess, MD as PCP - General (Internal Medicine) Kem Parkinson, MD (Ophthalmology) Vanita Ingles, RN as Case Manager ([place]) Anthonette Legato, MD (Nephrology)  Indicate any recent Medical Services you may have received from other than [facility] providers in the past year (date may be approximate).     Assessment:   This is a routine wellness examination for [place].  Hearing/Vision screen  Hearing Screening   125Hz  250Hz  500Hz  1000Hz  2000Hz  3000Hz  4000Hz  6000Hz  8000Hz   Right ear:           Left ear:           Comments: Pt c/o poor hearing, previously had hearing aids that did not work well; plans to go to ENT for reevaluation  Vision Screening Comments: [facility] screen[facility]  Dietary issues and exercise activities discussed: Current Exercise Habits: The patient does not participate in regular exercise at present, Exercise limited by: neurologic condition(s);orthopedic condition(s)  Goals    . RNCM: I would like help getting a new power wheelchair     [place] (see longtitudinal plan of care for additional care plan information)  Current Barriers:  . Chronic Disease Management support, education, and care coordination needs related to HTN, HLD, and DMII . Equipment Needs . Limited Mobility due to Chronic Health Conditions   Clinical Goal(s) related to HTN, HLD, and DMII:  Over the next 120 days, patient will:  . Work with the care management team to address educational, disease management, and care coordination needs  . Begin or continue self health monitoring activities as directed today Measure and record cbg (blood glucose) 2 times weekly or prn, Measure and record blood  pressure 3 times per week, and adhere to heart healthy/ADA diet . Call provider office for new or worsened signs and symptoms Blood glucose findings outside established parameters, Blood  pressure findings outside established parameters, and New or worsened symptom related to HLD and other chronic health conditions . Call care management team with questions or concerns . Verbalize basic understanding of patient centered plan of care established today  Interventions related to HTN, HLD, and DMII:  . Evaluation of current treatment plans and patient's adherence to plan as established by provider.  05-23-2020: The patient feels she is "maintaining".  She states she saw Dr. Holley Raring recently and the current regimen that she is on Veltassa 8.4g is working well at keeping her potassium stabilized. . Assessed patient understanding of disease states- good understanding of chronic health conditions, works with the diabetes education program. Last hemoglobin A1C 6.1. 05-23-2020: The patient has switched from Lantus to [place] due to insurance coverage requirements. She wants to talk to her pcp about her fasting blood sugars being around 130's now. She has not had any changes that she knows of except for the change in Lantus to [place]. Will plan on talking to Dr. Army Melia concerning this. Denies any episodes of hyperglycemia.  . Assessed blood pressure readings. The patient states that her blood pressure is running a little high and Dr. Army Melia is having to "tweak" her medications. This am it was 160/76.  The patient denies having a headache when her blood pressure is elevated.  05-23-2020: The patient is monitoring her blood pressures. They can be up and down also.  She lets the pcp know when blood pressures are out of range.  . Assessed patient's education and care coordination needs- The patient currently has a [id number] power chair, the chair is 69 years old. The patient wants assistance with obtaining a new power chair  that raises and lowers.  The patient has a catalog with [employer].  She is interested in the [place] cyr 2.  Care guide referral for assistance and help with best way to obtain DME and work with pcp for writing order.  Also spoke with Janalyn Shy due to insurance with [facility] [employer] and Medicare part A and B for assistance and direction. Will call the patient with findings as soon as they are available. Updated information: Call to patient by [facility] to let her know about the Medicare requirements for a power wheelchair.  Also let the patient know there is a medical supply company in [place] that carry and can order the [employer]. Provided this information to the patient and will also send the information by email to [email or web address].  Care guide also spoke to the patient and gave her information as well. Will continue to assist as needed in getting the patient power wheelchair to meet her health and wellness goals. 05-23-2020: The patient states she still does not have her new power chair. The patient states she has talked to several people but has not heard back from anyone recently. Information provided today to the patient with the number to [facility] [phone], fax [phone],  [address], [place] [address],  and [employer] Medicare Supplier/Brent Loyce [employer] sales person direct number [phone] in [place]. One of the barriers to getting Medicare to pay for the DME is the primary insurance is with [facility] [employer]. The patient was thankful for the contact information. The patient will call the [facility] for any questions or additional help needed.  . Provided disease specific education to patient. 05-23-2020: Review of dietary habits. The patient is enjoying fresh fruits and vegetables from the garden.  The patient states she tries to eat  healthy but sometimes "falls off of the wagon".  The patient feels overall she is doing well at maintaining her health and well being.   Nash Dimmer with appropriate clinical care team members regarding patient needs- the patient is aware of pharmacist and LCSW on the CCM team for assistance.  Patient Self Care Activities related to HTN, HLD, and DMII:  . Patient is unable to independently self-manage chronic health conditions  Please see past updates related to this goal by clicking on the "Past Updates" button in the selected goal        Depression Screen PHQ 2/9 Scores 01/06/2021 10/01/2020 08/06/2020 12/27/2019 12/25/2019 05/17/2019 05/17/2019  PHQ - 2 Score 0 0 0 0 - 0 0  PHQ- 9 Score - 0 0 - - 1 -  Exception Documentation - - - - Medical reason - -    Fall Risk Fall Risk  01/06/2021 10/01/2020 08/06/2020 12/27/2019 12/13/2018  Falls in the past year? 0 0 0 0 0  Number falls in past yr: 0 - - 0 0  Injury with Fall? 0 - - 0 0  Risk for fall due to : Impaired balance/gait;Impaired mobility - - Impaired mobility;Impaired balance/gait -  Follow up Falls prevention discussed Falls evaluation completed Falls evaluation completed Falls prevention discussed Falls evaluation completed    FALL RISK PREVENTION PERTAINING TO THE HOME:  Any stairs in or around the home? No  If so, are there any without handrails? No  Home free of loose throw rugs in walkways, pet beds, electrical cords, etc? Yes  Adequate lighting in your home to reduce risk of falls? Yes   ASSISTIVE DEVICES UTILIZED TO PREVENT FALLS:  Life alert? No  Use of a cane, walker or w/c? Yes  Grab bars in the bathroom? Yes  Shower chair or bench in shower? Yes  Elevated toilet seat or a handicapped toilet? Yes   TIMED UP AND GO:  Was the test performed? No . Telephonic visit.    Cognitive Function: Normal cognitive status assessed by direct observation by this Nurse Health Advisor. No abnormalities found.       6CIT Screen 12/27/2019  What Year? 0 points  What month? 0 points  What time? 0 points  Count back from 20 0 points  Months in reverse 0  points  Repeat phrase 0 points  Total Score 0    Immunizations Immunization History  Administered Date(s) Administered  . Influenza-Unspecified 10/18/2017, 08/01/2019, 07/19/2020  . Moderna Sars-Covid-2 Vaccination 11/23/2019, 12/21/2019, 08/16/2020  . Pneumococcal Conjugate-13 04/13/2018  . Pneumococcal Polysaccharide-23 10/25/2014, 08/06/2020  . Td 10/19/2005  . Zoster 10/20/2011    TDAP status: Due, Education has been provided regarding the importance of this vaccine. Advised may receive this vaccine at local pharmacy or Health Dept. Aware to provide a copy of the vaccination record if obtained from local pharmacy or Health Dept. Verbalized acceptance and understanding.  Flu Vaccine status: Up to date  Pneumococcal vaccine status: Up to date  Covid-19 vaccine status: Completed vaccines  Qualifies for Shingles Vaccine? Yes   Zostavax completed Yes   Shingrix Completed?: No.    Education has been provided regarding the importance of this vaccine. Patient has been advised to call insurance company to determine out of pocket expense if they have not yet received this vaccine. Advised may also receive vaccine at local pharmacy or Health Dept. Verbalized acceptance and understanding.  Screening Tests Health Maintenance  Topic Date Due  . COLONOSCOPY (Pts 45-25yrs Insurance coverage will  need to be confirmed)  Never done  . TETANUS/TDAP  10/20/2015  . MAMMOGRAM  05/04/2020  . HEMOGLOBIN A1C  02/06/2021  . FOOT EXAM  08/06/2021  . URINE MICROALBUMIN  10/01/2021  . OPHTHALMOLOGY EXAM  12/10/2021  . INFLUENZA VACCINE  Completed  . DEXA SCAN  Completed  . COVID-19 Vaccine  Completed  . Hepatitis C Screening  Completed  . PNA vac Low Risk Adult  Completed  . HPV VACCINES  Aged Out    Health Maintenance  Health Maintenance Due  Topic Date Due  . COLONOSCOPY (Pts 45-71yrs Insurance coverage will need to be confirmed)  Never done  . TETANUS/TDAP  10/20/2015  . MAMMOGRAM   05/04/2020   Colorectal cancer screening: unable to complete colonoscopy   Mammogram status: Completed 05/04/18. Repeat every year. Ordered today.   Bone Density status: Completed 05/04/18. Results reflect: Bone density results: NORMAL. Repeat every 2 years. Ordered today.   Lung Cancer Screening: (Low Dose CT Chest recommended if Age 77-80 years, 30 pack-year currently smoking OR have quit w/in 15years.) does not qualify.  Additional Screening:  Hepatitis C Screening: does qualify; Completed 2018  Vision Screening: Recommended annual ophthalmology exams for early detection of glaucoma and other disorders of the eye. Is the patient up to date with their annual eye exam?  Yes  Who is the provider or what is the name of the office in which the patient attends annual eye exams? [place] Screening: Recommended annual dental exams for proper oral hygiene  Community Resource Referral / Chronic Care Management: CRR required this visit?  No   CCM required this visit?  No      Plan:     I have personally reviewed and noted the following in the patient's chart:   . Medical and social history . Use of alcohol, tobacco or illicit drugs  . Current medications and supplements . Functional ability and status . Nutritional status . Physical activity . Advanced directives . List of other physicians . Hospitalizations, surgeries, and ER visits in previous 12 months . Vitals . Screenings to include cognitive, depression, and falls . Referrals and appointments  In addition, I have reviewed and discussed with patient certain preventive protocols, quality metrics, and best practice recommendations. A written personalized care plan for preventive services as well as general preventive health recommendations were provided to patient.     Clemetine Marker, LPN   [id number]   Nurse Notes: none

## 2021-01-06 NOTE — Patient Instructions (Signed)
Maria Singh , Thank you for taking time to come for your Medicare Wellness Visit. I appreciate your ongoing commitment to your health goals. Please review the following plan we discussed and let me know if I can assist you in the future.   Screening recommendations/referrals: Colonoscopy: unable to complete Mammogram: done 05/04/18. Please call [phone] to schedule your mammogram and bone density.  Bone Density: done 05/04/18 Recommended yearly ophthalmology/optometry visit for glaucoma screening and checkup Recommended yearly dental visit for hygiene and checkup  Vaccinations: Influenza vaccine: done 07/19/20 Pneumococcal vaccine: done 08/06/20 Tdap vaccine: due Shingles vaccine: Shingrix discussed. Please contact your pharmacy for coverage information.  Covid-19: done 11/23/19, 12/21/19 & 08/16/20  Advanced directives: Please bring a copy of your health care power of attorney and living will to the office at your convenience.  Conditions/risks identified: Recommend continuing fall prevention in the home  Next appointment: Follow up in one year for your annual wellness visit    Preventive Care 65 Years and Older, Female Preventive care refers to lifestyle choices and visits with your health care provider that can promote health and wellness. What does preventive care include?  A yearly physical exam. This is also called an annual well check.  Dental exams once or twice a year.  Routine eye exams. Ask your health care provider how often you should have your eyes checked.  Personal lifestyle choices, including:  Daily care of your teeth and gums.  Regular physical activity.  Eating a healthy diet.  Avoiding tobacco and drug use.  Limiting alcohol use.  Practicing safe sex.  Taking low-dose aspirin every day.  Taking vitamin and mineral supplements as recommended by your health care provider. What happens during an annual well check? The services and screenings done by  your health care provider during your annual well check will depend on your age, overall health, lifestyle risk factors, and family history of disease. Counseling  Your health care provider may ask you questions about your:  Alcohol use.  Tobacco use.  Drug use.  Emotional well-being.  Home and relationship well-being.  Sexual activity.  Eating habits.  History of falls.  Memory and ability to understand (cognition).  Work and work Statistician.  Reproductive health. Screening  You may have the following tests or measurements:  Height, weight, and BMI.  Blood pressure.  Lipid and cholesterol levels. These may be checked every 5 years, or more frequently if you are over 75 years old.  Skin check.  Lung cancer screening. You may have this screening every year starting at age 50 if you have a 30-pack-year history of smoking and currently smoke or have quit within the past 15 years.  Fecal occult blood test (FOBT) of the stool. You may have this test every year starting at age 29.  Flexible sigmoidoscopy or colonoscopy. You may have a sigmoidoscopy every 5 years or a colonoscopy every 10 years starting at age 35.  Hepatitis C blood test.  Hepatitis B blood test.  Sexually transmitted disease (STD) testing.  Diabetes screening. This is done by checking your blood sugar (glucose) after you have not eaten for a while (fasting). You may have this done every 1-3 years.  Bone density scan. This is done to screen for osteoporosis. You may have this done starting at age 70.  Mammogram. This may be done every 1-2 years. Talk to your health care provider about how often you should have regular mammograms. Talk with your health care provider about your test results,  treatment options, and if necessary, the need for more tests. Vaccines  Your health care provider may recommend certain vaccines, such as:  Influenza vaccine. This is recommended every year.  Tetanus,  diphtheria, and acellular pertussis (Tdap, Td) vaccine. You may need a Td booster every 10 years.  Zoster vaccine. You may need this after age 45.  Pneumococcal 13-valent conjugate (PCV13) vaccine. One dose is recommended after age 53.  Pneumococcal polysaccharide (PPSV23) vaccine. One dose is recommended after age 80. Talk to your health care provider about which screenings and vaccines you need and how often you need them. This information is not intended to replace advice given to you by your health care provider. Make sure you discuss any questions you have with your health care provider. Document Released: 11/01/2015 Document Revised: 06/24/2016 Document Reviewed: 08/06/2015 Elsevier Interactive Patient Education  2017 [employer] Prevention in the Home Falls can cause injuries. They can happen to people of all ages. There are many things you can do to make your home safe and to help prevent falls. What can I do on the outside of my home?  Regularly fix the edges of walkways and driveways and fix any cracks.  Remove anything that might make you trip as you walk through a door, such as a raised step or threshold.  Trim any bushes or trees on the path to your home.  Use bright outdoor lighting.  Clear any walking paths of anything that might make someone trip, such as rocks or tools.  Regularly check to see if handrails are loose or broken. Make sure that both sides of any steps have handrails.  Any raised decks and porches should have guardrails on the edges.  Have any leaves, snow, or ice cleared regularly.  Use sand or salt on walking paths during winter.  Clean up any spills in your garage right away. This includes oil or grease spills. What can I do in the bathroom?  Use night lights.  Install grab bars by the toilet and in the tub and shower. Do not use towel bars as grab bars.  Use non-skid mats or decals in the tub or shower.  If you need to sit down in  the shower, use a plastic, non-slip stool.  Keep the floor dry. Clean up any water that spills on the floor as soon as it happens.  Remove soap buildup in the tub or shower regularly.  Attach bath mats securely with double-sided non-slip rug tape.  Do not have throw rugs and other things on the floor that can make you trip. What can I do in the bedroom?  Use night lights.  Make sure that you have a light by your bed that is easy to reach.  Do not use any sheets or blankets that are too big for your bed. They should not hang down onto the floor.  Have a firm chair that has side arms. You can use this for support while you get dressed.  Do not have throw rugs and other things on the floor that can make you trip. What can I do in the kitchen?  Clean up any spills right away.  Avoid walking on wet floors.  Keep items that you use a lot in easy-to-reach places.  If you need to reach something above you, use a strong step stool that has a grab bar.  Keep electrical cords out of the way.  Do not use floor polish or wax that makes floors  slippery. If you must use wax, use non-skid floor wax.  Do not have throw rugs and other things on the floor that can make you trip. What can I do with my stairs?  Do not leave any items on the stairs.  Make sure that there are handrails on both sides of the stairs and use them. Fix handrails that are broken or loose. Make sure that handrails are as long as the stairways.  Check any carpeting to make sure that it is firmly attached to the stairs. Fix any carpet that is loose or worn.  Avoid having throw rugs at the top or bottom of the stairs. If you do have throw rugs, attach them to the floor with carpet tape.  Make sure that you have a light switch at the top of the stairs and the bottom of the stairs. If you do not have them, ask someone to add them for you. What else can I do to help prevent falls?  Wear shoes that:  Do not have high  heels.  Have rubber bottoms.  Are comfortable and fit you well.  Are closed at the toe. Do not wear sandals.  If you use a stepladder:  Make sure that it is fully opened. Do not climb a closed stepladder.  Make sure that both sides of the stepladder are locked into place.  Ask someone to hold it for you, if possible.  Clearly mark and make sure that you can see:  Any grab bars or handrails.  First and last steps.  Where the edge of each step is.  Use tools that help you move around (mobility aids) if they are needed. These include:  Canes.  Walkers.  Scooters.  Crutches.  Turn on the lights when you go into a dark area. Replace any light bulbs as soon as they burn out.  Set up your furniture so you have a clear path. Avoid moving your furniture around.  If any of your floors are uneven, fix them.  If there are any pets around you, be aware of where they are.  Review your medicines with your doctor. Some medicines can make you feel dizzy. This can increase your chance of falling. Ask your doctor what other things that you can do to help prevent falls. This information is not intended to replace advice given to you by your health care provider. Make sure you discuss any questions you have with your health care provider. Document Released: 08/01/2009 Document Revised: 03/12/2016 Document Reviewed: 11/09/2014 Elsevier Interactive Patient Education  2017 [employer].

## 2021-01-15 ENCOUNTER — Other Ambulatory Visit: Payer: Self-pay | Admitting: Internal Medicine

## 2021-01-18 ENCOUNTER — Other Ambulatory Visit: Payer: Self-pay

## 2021-01-18 MED FILL — Insulin Glargine Soln Pen-Injector 100 Unit/ML: SUBCUTANEOUS | 28 days supply | Qty: 30 | Fill #0 | Status: CN

## 2021-01-20 ENCOUNTER — Other Ambulatory Visit: Payer: Self-pay | Admitting: Internal Medicine

## 2021-01-20 ENCOUNTER — Other Ambulatory Visit: Payer: Self-pay

## 2021-01-20 DIAGNOSIS — E118 Type 2 diabetes mellitus with unspecified complications: Secondary | ICD-10-CM

## 2021-01-20 MED ORDER — INSULIN GLARGINE-YFGN 100 UNIT/ML ~~LOC~~ SOPN
PEN_INJECTOR | SUBCUTANEOUS | 3 refills | Status: DC
  Filled 2021-01-20: qty 30, 27d supply, fill #0
  Filled 2021-01-20: qty 30, 28d supply, fill #0
  Filled 2021-02-19: qty 30, 28d supply, fill #1
  Filled 2021-03-19: qty 30, 28d supply, fill #2
  Filled 2021-04-16: qty 30, 28d supply, fill #3

## 2021-01-20 MED FILL — Metformin HCl Tab 1000 MG: ORAL | 90 days supply | Qty: 180 | Fill #0 | Status: AC

## 2021-01-20 MED FILL — Insulin Glargine Soln Pen-Injector 100 Unit/ML: SUBCUTANEOUS | 30 days supply | Qty: 30 | Fill #0 | Status: CN

## 2021-01-20 NOTE — Telephone Encounter (Signed)
   Notes to clinic: Maria Singh is now preferred on insurance. Please send updated Rx. Thanks   Requested Prescriptions  Pending Prescriptions Disp Refills   Insulin Glargine (BASAGLAR KWIKPEN) 100 UNIT/ML 30 mL 0    Sig: INJECT 60 UNITS UNDER THE SKIN IN THE MORNING AND 50 UNITS IN THE EVENING.      Endocrinology:  Diabetes - Insulins Passed - 01/20/2021  9:09 AM      Passed - HBA1C is between 0 and 7.9 and within 180 days    Hemoglobin A1C  Date Value Ref Range Status  08/09/2013 7.2 (H) 4.2 - 6.3 % Final    Comment:    The American Diabetes Association recommends that a primary goal of therapy should be <7% and that physicians should reevaluate the treatment regimen in patients with HbA1c values consistently >8%.    Hgb A1c MFr Bld  Date Value Ref Range Status  08/08/2020 6.0 (H) 4.8 - 5.6 % Final    Comment:             Prediabetes: 5.7 - 6.4          Diabetes: >6.4          Glycemic control for adults with diabetes: <7.0           Passed - Valid encounter within last 6 months    Recent Outpatient Visits           3 months ago Type II diabetes mellitus with complication [facility])   [facility]   5 months ago Type II diabetes mellitus with complication [facility])   [facility]   1 year ago Hyperkalemia   [facility] Gle[facility], [facility]   1 year ago Annual physical exam   [facility] Glean Hess, [facility]   2 years ago Type 2 diabetes mellitus with diabetic dermatitis, with long-term current use of insulin [facility])   [facility] Glean Hess, [facility]

## 2021-01-21 ENCOUNTER — Other Ambulatory Visit: Payer: Self-pay

## 2021-01-22 ENCOUNTER — Ambulatory Visit
Admission: RE | Admit: 2021-01-22 | Discharge: 2021-01-22 | Disposition: A | Discharge status: Home / Self Care | Payer: 59 | Source: Ambulatory Visit | Attending: Internal Medicine | Admitting: Internal Medicine

## 2021-01-22 ENCOUNTER — Other Ambulatory Visit: Payer: Self-pay

## 2021-01-22 DIAGNOSIS — Z780 Asymptomatic menopausal state: Secondary | ICD-10-CM | POA: Insufficient documentation

## 2021-01-22 DIAGNOSIS — Z13820 Encounter for screening for osteoporosis: Secondary | ICD-10-CM | POA: Insufficient documentation

## 2021-01-22 DIAGNOSIS — Z1231 Encounter for screening mammogram for malignant neoplasm of breast: Secondary | ICD-10-CM | POA: Insufficient documentation

## 2021-02-04 ENCOUNTER — Other Ambulatory Visit: Payer: Self-pay | Admitting: Internal Medicine

## 2021-02-04 DIAGNOSIS — R21 Rash and other nonspecific skin eruption: Secondary | ICD-10-CM

## 2021-02-04 MED ORDER — FLUCONAZOLE 100 MG PO TABS: 100.0000 mg | ORAL_TABLET | Freq: Every day | ORAL | 0 refills | Status: AC

## 2021-02-06 ENCOUNTER — Other Ambulatory Visit: Payer: Self-pay

## 2021-02-06 MED FILL — Sodium Bicarbonate Tab 650 MG: ORAL | 30 days supply | Qty: 120 | Fill #0 | Status: AC

## 2021-02-06 MED FILL — Liraglutide Soln Pen-injector 18 MG/3ML (6 MG/ML): SUBCUTANEOUS | 90 days supply | Qty: 27 | Fill #0 | Status: AC

## 2021-02-13 ENCOUNTER — Other Ambulatory Visit: Payer: Self-pay

## 2021-02-19 ENCOUNTER — Other Ambulatory Visit: Payer: Self-pay

## 2021-02-25 ENCOUNTER — Other Ambulatory Visit: Payer: Self-pay

## 2021-02-25 ENCOUNTER — Other Ambulatory Visit: Payer: Self-pay | Admitting: Internal Medicine

## 2021-02-25 MED ORDER — FREESTYLE LITE TEST VI STRP
ORAL_STRIP | 5 refills | Status: AC
  Filled 2021-02-25: qty 200, 90d supply, fill #0
  Filled 2022-02-16: qty 200, 90d supply, fill #1

## 2021-02-25 NOTE — Telephone Encounter (Signed)
  Notes to clinic:  Review change requested by pharmacy for refill I don't see freestyle on the medication list    Requested Prescriptions  Pending Prescriptions Disp Refills   glucose blood (FREESTYLE LITE) test strip 100 strip 0    Sig: [facility] A DAY      Endocrinology: Diabetes - Testing Supplies Passed - 02/25/2021  1:08 PM      Passed - Valid encounter within last 12 months    Recent Outpatient Visits           4 months ago Type II diabetes mellitus with complication [facility])   [facility]   6 months ago Type II diabetes mellitus with complication [facility])   [facility]   1 year ago Hyperkalemia   [facility]   1 year ago Annual physical exam   [facility]   2 years ago Type 2 diabetes mellitus with diabetic dermatitis, with long-term current use of insulin [facility])   [facility] Glean Hess, MD

## 2021-03-11 ENCOUNTER — Other Ambulatory Visit: Payer: Self-pay

## 2021-03-11 ENCOUNTER — Other Ambulatory Visit: Payer: Self-pay | Admitting: Internal Medicine

## 2021-03-11 DIAGNOSIS — F329 Major depressive disorder, single episode, unspecified: Secondary | ICD-10-CM

## 2021-03-11 DIAGNOSIS — I10 Essential (primary) hypertension: Secondary | ICD-10-CM

## 2021-03-11 MED FILL — Sertraline HCl Tab 100 MG: ORAL | 90 days supply | Qty: 90 | Fill #0 | Status: AC

## 2021-03-11 MED FILL — Rosuvastatin Calcium Tab 5 MG: ORAL | 90 days supply | Qty: 90 | Fill #0 | Status: AC

## 2021-03-11 MED FILL — Amlodipine Besylate Tab 5 MG (Base Equivalent): ORAL | 90 days supply | Qty: 90 | Fill #0 | Status: AC

## 2021-03-11 MED FILL — Hydrochlorothiazide Tab 25 MG: ORAL | 90 days supply | Qty: 90 | Fill #0 | Status: AC

## 2021-03-19 ENCOUNTER — Other Ambulatory Visit: Payer: Self-pay

## 2021-03-19 MED FILL — Insulin Lispro Soln Pen-injector 100 Unit/ML (1 Unit Dial): SUBCUTANEOUS | 90 days supply | Qty: 60 | Fill #0 | Status: AC

## 2021-04-11 ENCOUNTER — Other Ambulatory Visit: Payer: Self-pay

## 2021-04-14 ENCOUNTER — Other Ambulatory Visit: Payer: Self-pay

## 2021-04-14 MED ORDER — SODIUM BICARBONATE 650 MG PO TABS
ORAL_TABLET | ORAL | 11 refills | Status: DC
  Filled 2021-04-14: qty 120, 30d supply, fill #0
  Filled 2021-06-25: qty 120, 30d supply, fill #1
  Filled 2021-08-25: qty 120, 30d supply, fill #2
  Filled 2021-10-22: qty 120, 30d supply, fill #3
  Filled 2021-12-17: qty 120, 30d supply, fill #4

## 2021-04-16 ENCOUNTER — Other Ambulatory Visit: Payer: Self-pay

## 2021-04-18 ENCOUNTER — Other Ambulatory Visit: Payer: Self-pay

## 2021-04-18 ENCOUNTER — Other Ambulatory Visit: Payer: Self-pay | Admitting: Internal Medicine

## 2021-04-18 DIAGNOSIS — U071 COVID-19: Secondary | ICD-10-CM

## 2021-04-18 DIAGNOSIS — R059 Cough, unspecified: Secondary | ICD-10-CM

## 2021-04-18 MED ORDER — MOLNUPIRAVIR EUA 200MG CAPSULE
4.0000 | ORAL_CAPSULE | Freq: Two times a day (BID) | ORAL | 0 refills | Status: AC
  Filled 2021-04-18: qty 40, 5d supply, fill #0

## 2021-04-18 MED ORDER — GUAIFENESIN-CODEINE 100-10 MG/5ML PO SYRP
5.0000 mL | ORAL_SOLUTION | Freq: Three times a day (TID) | ORAL | 0 refills | Status: DC | PRN
  Filled 2021-04-18: qty 118, 8d supply, fill #0

## 2021-04-25 ENCOUNTER — Other Ambulatory Visit: Payer: Self-pay

## 2021-04-25 ENCOUNTER — Other Ambulatory Visit (HOSPITAL_COMMUNITY): Payer: Self-pay

## 2021-04-25 ENCOUNTER — Other Ambulatory Visit: Payer: Self-pay | Admitting: Internal Medicine

## 2021-04-25 DIAGNOSIS — E118 Type 2 diabetes mellitus with unspecified complications: Secondary | ICD-10-CM

## 2021-04-25 MED ORDER — GVOKE HYPOPEN 1-PACK 1 MG/0.2ML ~~LOC~~ SOAJ
1.0000 | Freq: Every day | SUBCUTANEOUS | 0 refills | Status: DC | PRN
  Filled 2021-04-25: qty 0.4, 30d supply, fill #0

## 2021-04-28 ENCOUNTER — Other Ambulatory Visit: Payer: Self-pay

## 2021-05-06 ENCOUNTER — Other Ambulatory Visit: Payer: Self-pay

## 2021-05-06 DIAGNOSIS — N182 Chronic kidney disease, stage 2 (mild): Secondary | ICD-10-CM | POA: Diagnosis not present

## 2021-05-06 DIAGNOSIS — E875 Hyperkalemia: Secondary | ICD-10-CM | POA: Diagnosis not present

## 2021-05-06 DIAGNOSIS — E1122 Type 2 diabetes mellitus with diabetic chronic kidney disease: Secondary | ICD-10-CM | POA: Diagnosis not present

## 2021-05-06 DIAGNOSIS — I10 Essential (primary) hypertension: Secondary | ICD-10-CM | POA: Diagnosis not present

## 2021-05-06 LAB — HEMOGLOBIN A1C: Hemoglobin A1C: 5.7

## 2021-05-06 MED ORDER — VELTASSA 8.4 G PO PACK
PACK | ORAL | 11 refills | Status: DC
  Filled 2021-05-06 – 2021-10-01 (×2): qty 30, 30d supply, fill #0
  Filled 2022-01-05: qty 30, 30d supply, fill #1
  Filled 2022-04-01: qty 30, 30d supply, fill #2

## 2021-05-12 ENCOUNTER — Other Ambulatory Visit: Payer: Self-pay | Admitting: Internal Medicine

## 2021-05-12 ENCOUNTER — Other Ambulatory Visit: Payer: Self-pay

## 2021-05-12 DIAGNOSIS — E109 Type 1 diabetes mellitus without complications: Secondary | ICD-10-CM

## 2021-05-12 DIAGNOSIS — E118 Type 2 diabetes mellitus with unspecified complications: Secondary | ICD-10-CM

## 2021-05-12 DIAGNOSIS — E1165 Type 2 diabetes mellitus with hyperglycemia: Secondary | ICD-10-CM

## 2021-05-12 MED ORDER — VICTOZA 18 MG/3ML ~~LOC~~ SOPN
PEN_INJECTOR | SUBCUTANEOUS | 5 refills | Status: DC
Start: 2021-05-12 — End: 2022-02-16
  Filled 2021-05-12: qty 27, 90d supply, fill #0
  Filled 2021-08-04: qty 27, 90d supply, fill #1
  Filled 2021-11-10: qty 27, 90d supply, fill #2
  Filled 2022-02-16: qty 9, 30d supply, fill #3

## 2021-05-12 MED ORDER — INSULIN GLARGINE-YFGN 100 UNIT/ML ~~LOC~~ SOPN
PEN_INJECTOR | SUBCUTANEOUS | 3 refills | Status: DC
  Filled 2021-05-12: qty 30, 28d supply, fill #0
  Filled 2021-06-09: qty 30, 28d supply, fill #1
  Filled 2021-07-09: qty 30, 28d supply, fill #2
  Filled 2021-08-04: qty 30, 28d supply, fill #3

## 2021-05-12 MED FILL — Patiromer Sorbitex Calcium For Susp Packet 8.4 GM (Base Eq): ORAL | 30 days supply | Qty: 30 | Fill #0 | Status: AC

## 2021-05-12 NOTE — Telephone Encounter (Signed)
Requested medication (s) are due for refill today: yes  Requested medication (s) are on the active medication list:yes  Last refill:  04/16/2021  Future visit scheduled:no  Notes to clinic:  Medication not assigned to a protocol, review manually   Requested Prescriptions  Pending Prescriptions Disp Refills   liraglutide (VICTOZA) 18 MG/3ML SOPN 27 mL 5    Sig: INJECT 1.8 MLS (10.8 MG TOTAL) INTO THE SKIN ONCE DAILY      Endocrinology:  Diabetes - GLP-1 Receptor Agonists Failed - 05/12/2021 10:45 AM      Failed - HBA1C is between 0 and 7.9 and within 180 days    Hemoglobin A1C  Date Value Ref Range Status  08/09/2013 7.2 (H) 4.2 - 6.3 % Final    Comment:    The American Diabetes Association recommends that a primary goal of therapy should be <7% and that physicians should reevaluate the treatment regimen in patients with HbA1c values consistently >8%.    Hgb A1c MFr Bld  Date Value Ref Range Status  08/08/2020 6.0 (H) 4.8 - 5.6 % Final    Comment:             Prediabetes: 5.7 - 6.4          Diabetes: >6.4          Glycemic control for adults with diabetes: <7.0           Failed - Valid encounter within last 6 months    Recent Outpatient Visits           7 months ago Type II diabetes mellitus with complication [facility])   [facility]   9 months ago Type II diabetes mellitus with complication [facility])   [facility], [facility]   1 year ago Hyperkalemia   [facility]   1 year ago Annual physical exam   [facility], [facility]   2 years ago Type 2 diabetes mellitus with diabetic dermatitis, with long-term current use of insulin [facility])   [facility] Glean [facility], [facility]                  insulin glargine-yfgn (SEMGLEE, YFGN,) 100 UNIT/ML SOPN 30 mL 3    Sig: Inject 60 Units into the skin every morning AND 50 Units every evening.      Off-Protocol Failed -  05/12/2021 10:45 AM      Failed - Medication not assigned to a protocol, review manually.      Passed - Valid encounter within last 12 months    Recent Outpatient Visits           7 months ago Type II diabetes mellitus with complication [facility])   [facility]   9 months ago Type II diabetes mellitus with complication [facility])   [facility]   1 year ago Hyperkalemia   [facility], [facility]   1 year ago Annual physical exam   [facility] Gle[facility], [facility]   2 years ago Type 2 diabetes mellitus with diabetic dermatitis, with long-term current use of insulin [facility])   [facility] Glean Hess, MD

## 2021-05-13 ENCOUNTER — Other Ambulatory Visit: Payer: Self-pay

## 2021-05-23 ENCOUNTER — Other Ambulatory Visit: Payer: Self-pay | Admitting: Internal Medicine

## 2021-05-23 ENCOUNTER — Other Ambulatory Visit: Payer: Self-pay

## 2021-05-23 DIAGNOSIS — E109 Type 1 diabetes mellitus without complications: Secondary | ICD-10-CM

## 2021-05-23 DIAGNOSIS — E1165 Type 2 diabetes mellitus with hyperglycemia: Secondary | ICD-10-CM

## 2021-05-23 MED FILL — Metformin HCl Tab 1000 MG: ORAL | 90 days supply | Qty: 180 | Fill #0 | Status: AC

## 2021-05-25 ENCOUNTER — Other Ambulatory Visit: Payer: Self-pay

## 2021-05-25 ENCOUNTER — Other Ambulatory Visit: Payer: Self-pay | Admitting: Internal Medicine

## 2021-05-25 DIAGNOSIS — E109 Type 1 diabetes mellitus without complications: Secondary | ICD-10-CM

## 2021-05-25 DIAGNOSIS — E1165 Type 2 diabetes mellitus with hyperglycemia: Secondary | ICD-10-CM

## 2021-05-25 MED ORDER — UNIFINE PENTIPS 32G X 6 MM MISC
3 refills | Status: DC
  Filled 2021-05-25: qty 300, 90d supply, fill #0

## 2021-05-25 NOTE — Telephone Encounter (Signed)
Requested Prescriptions  Pending Prescriptions Disp Refills  . Insulin Pen Needle 32G X 6 MM MISC 300 each 3    Sig: USE AS DIRECTED 3 TIMES A DAY     Endocrinology: Diabetes - Testing Supplies Passed - 05/25/2021  4:15 PM      Passed - Valid encounter within last 12 months    Recent Outpatient Visits          7 months ago Type II diabetes mellitus with complication [facility])   [facility]   9 months ago Type II diabetes mellitus with complication [facility])   [facility]   1 year ago Hyperkalemia   [facility], [facility]   2 years ago Annual physical exam   [facility], [facility]   2 years ago Type 2 diabetes mellitus with diabetic dermatitis, with long-term current use of insulin [facility])   [facility]             . metFORMIN (GLUCOPHAGE) 1000 MG tablet 180 tablet     Sig: TAKE 1 TABLET BY MOUTH 2 TIMES DAILY     Endocrinology:  Diabetes - Biguanides Failed - 05/25/2021  4:15 PM      Failed - HBA1C is between 0 and 7.9 and within 180 days    Hemoglobin A1C  Date Value Ref Range Status  08/09/2013 7.2 (H) 4.2 - 6.3 % Final    Comment:    The American Diabetes Association recommends that a primary goal of therapy should be <7% and that physicians should reevaluate the treatment regimen in patients with HbA1c values consistently >8%.    Hgb A1c MFr Bld  Date Value Ref Range Status  08/08/2020 6.0 (H) 4.8 - 5.6 % Final    Comment:             Prediabetes: 5.7 - 6.4          Diabetes: >6.4          Glycemic control for adults with diabetes: <7.0          Failed - Valid encounter within last 6 months    Recent Outpatient Visits          7 months ago Type II diabetes mellitus with complication [facility])   [facility]   9 months ago Type II diabetes mellitus with complication [facility])   [facility]   1 year ago Hyperkalemia   [facility], [facility]   2 years ago Annual physical exam   [facility] Gle[facility] Hess, [facility]   2 years ago Type 2 diabetes mellitus with diabetic dermatitis, with long-term current use of insulin [facility])   [facility]             Passed - Cr in normal range and within 360 days    Creatinine  Date Value Ref Range Status  08/11/2013 1.08 0.60 - 1.30 mg/dL Final   Creatinine, Ser  Date Value Ref Range Status  10/01/2020 0.91 0.57 - 1.00 mg/dL Final   Creatinine, Urine  Date Value Ref Range Status  12/07/2019 21 mg/dL Final    Comment:    Performed at [facility], [address], [place], [place] [address]         Passed - eGFR  in normal range and within 360 days    EGFR (African American)  Date Value Ref Range Status  08/11/2013 >60  Final   GFR calc Af Amer  Date Value Ref Range Status  10/01/2020 75 >59 mL/min/1.73 Final    Comment:    **In accordance with recommendations from the NKF-ASN Task force,**   [facility] is in the process of updating its eGFR calculation to the   2021 CKD-EPI creatinine equation that estimates kidney function   without a race variable.    EGFR (Non-African Amer.)  Date Value Ref Range Status  08/11/2013 55 (L)  Final    Comment:    eGFR values <55m/min/1.73 m2 may be an indication of chronic kidney disease (CKD). Calculated eGFR is useful in patients with stable renal function. The eGFR calculation will not be reliable in acutely ill patients when serum creatinine is changing rapidly. It is not useful in  patients on dialysis. The eGFR calculation may not be applicable to patients at the low and high extremes of body sizes, pregnant women, and vegetarians.    GFR calc non Af Amer  Date Value Ref Range Status  10/01/2020 65 >59 mL/min/1.73 Final

## 2021-05-25 NOTE — Telephone Encounter (Signed)
Reordered on 05/23/21

## 2021-05-26 ENCOUNTER — Other Ambulatory Visit: Payer: Self-pay

## 2021-05-27 ENCOUNTER — Other Ambulatory Visit: Payer: Self-pay

## 2021-05-28 ENCOUNTER — Other Ambulatory Visit: Payer: Self-pay

## 2021-06-02 IMAGING — MG MM DIGITAL SCREENING BILAT W/ TOMO AND CAD
6 of 10 series · 6 of 30 positions shown · non-contrast
Comparison: Previous exam(s).

ACR Breast Density Category a: The breast tissue is almost entirely
fatty.

CLINICAL DATA: Screening.

EXAM:
DIGITAL SCREENING BILATERAL MAMMOGRAM WITH TOMOSYNTHESIS AND CAD
TECHNIQUE: Bilateral screening digital craniocaudal and mediolateral oblique
mammograms were obtained. Bilateral screening digital breast
tomosynthesis was performed. The images were evaluated with
computer-aided detection.

[R CC synth-2D]
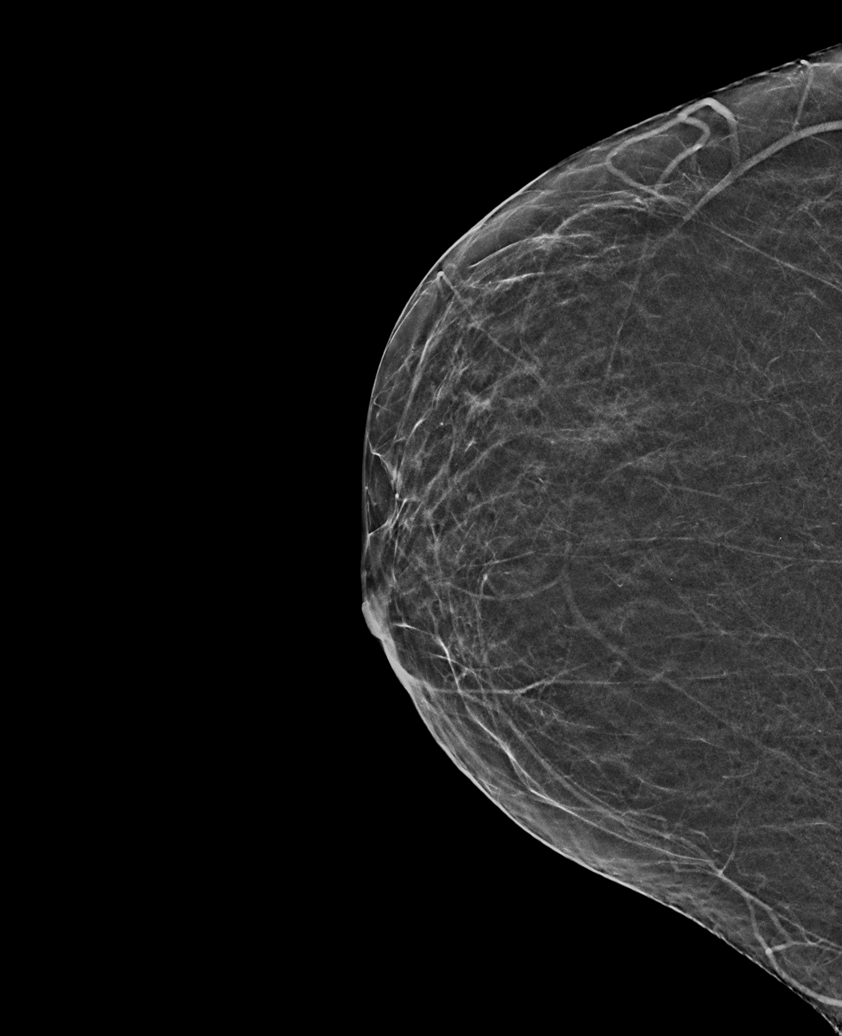

[R MLO synth-2D]
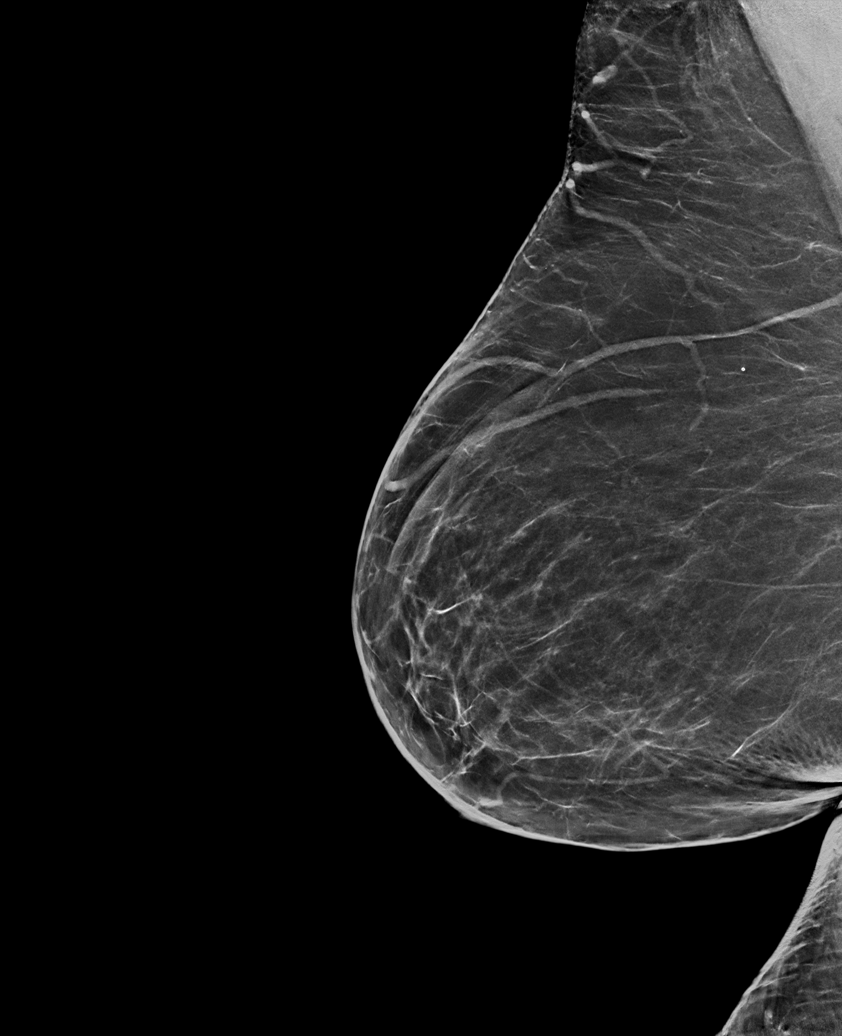

[L MLO synth-2D (1 of 2)]
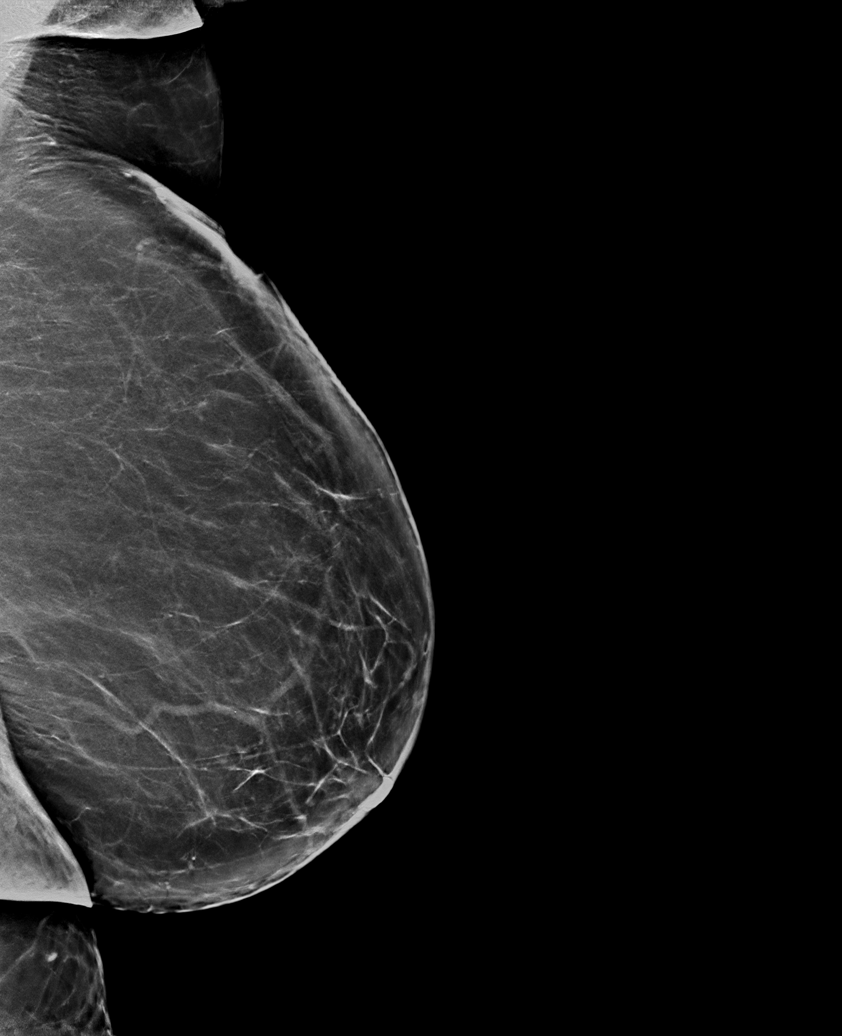

[L CC synth-2D]
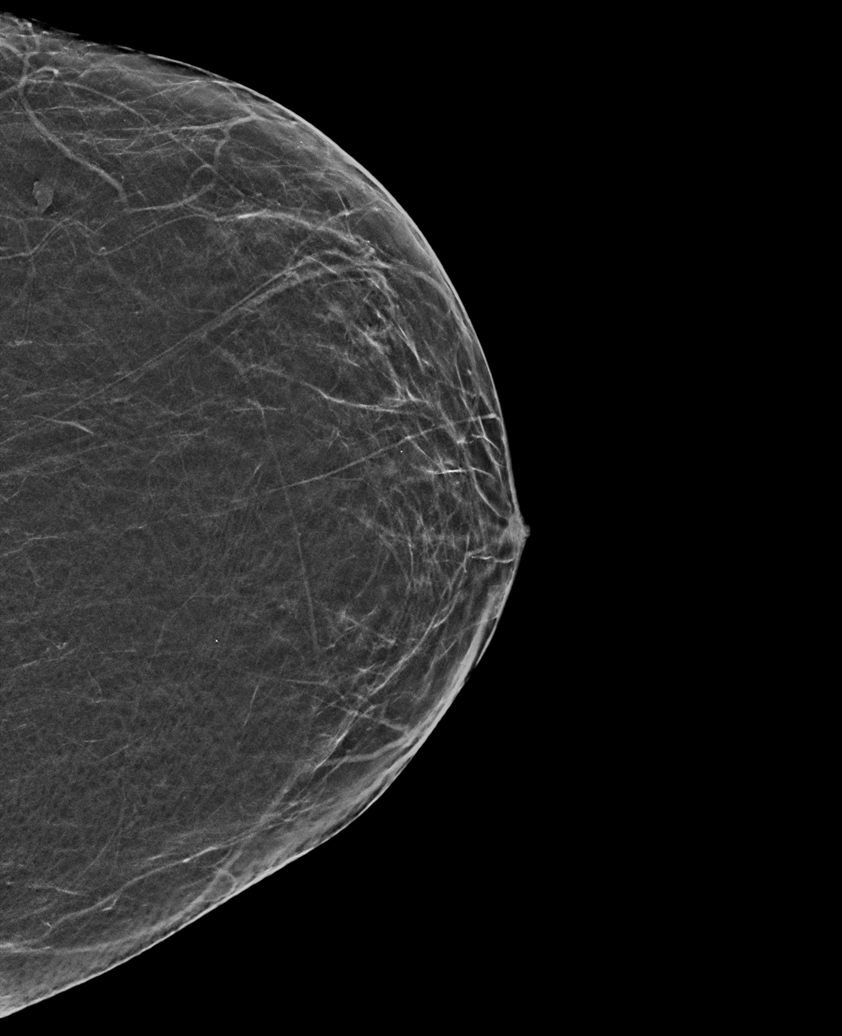

[L MLO synth-2D (2 of 2)]
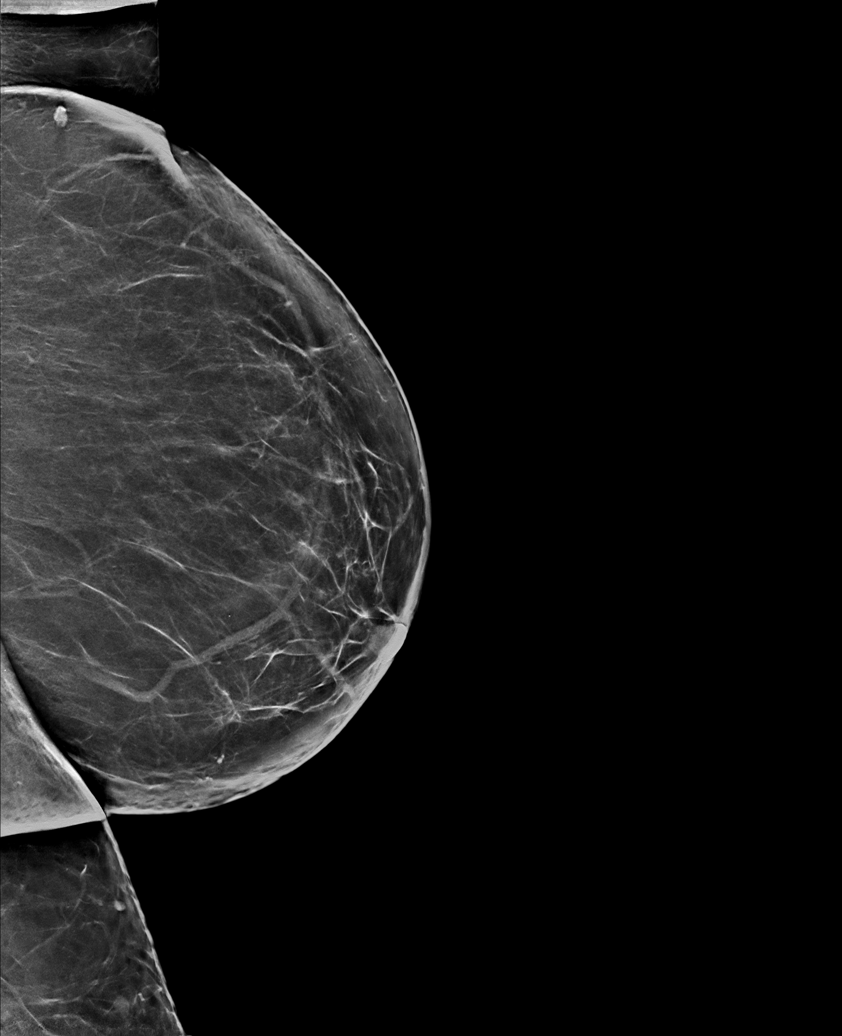

[R MLO tomo · tomo slice 36/71.0]
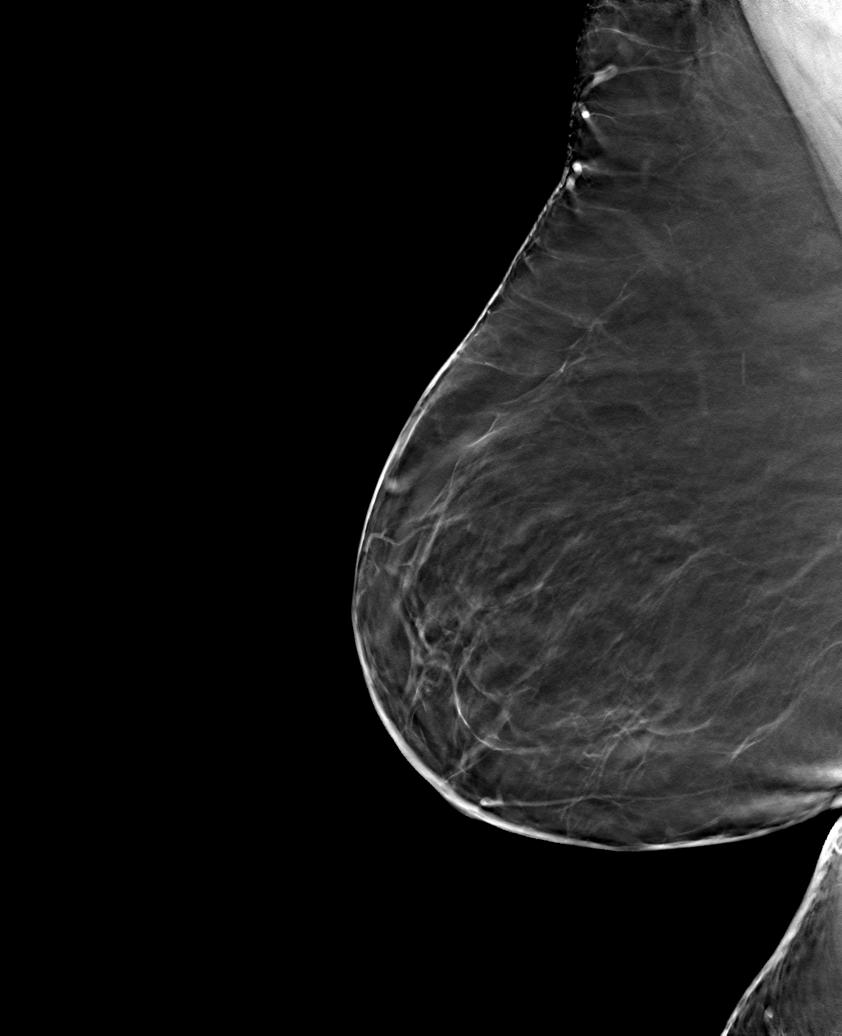

[6 of 30 positions shown; findings below may reference images not displayed]

FINDINGS: There are no findings suspicious for malignancy. The images were
evaluated with computer-aided detection.
IMPRESSION: No mammographic evidence of malignancy. A result letter of this
screening mammogram will be mailed directly to the patient.

RECOMMENDATION:
Screening mammogram in one year. (Code:[id number])

BI-RADS CATEGORY  1: Negative.

## 2021-06-09 ENCOUNTER — Other Ambulatory Visit: Payer: Self-pay | Admitting: Internal Medicine

## 2021-06-09 ENCOUNTER — Other Ambulatory Visit: Payer: Self-pay

## 2021-06-09 DIAGNOSIS — I10 Essential (primary) hypertension: Secondary | ICD-10-CM

## 2021-06-09 MED FILL — Hydrochlorothiazide Tab 25 MG: ORAL | 90 days supply | Qty: 90 | Fill #0 | Status: AC

## 2021-06-09 MED FILL — Rosuvastatin Calcium Tab 5 MG: ORAL | 90 days supply | Qty: 90 | Fill #1 | Status: AC

## 2021-06-09 MED FILL — Sertraline HCl Tab 100 MG: ORAL | 90 days supply | Qty: 90 | Fill #1 | Status: AC

## 2021-06-09 MED FILL — Amlodipine Besylate Tab 5 MG (Base Equivalent): ORAL | 90 days supply | Qty: 90 | Fill #0 | Status: AC

## 2021-06-09 NOTE — Telephone Encounter (Signed)
Requested medication (s) are due for refill today: yes  Requested medication (s) are on the active medication list: yes  Last refill:  03/11/2021  Future visit scheduled: No  Notes to clinic:  overdue for follow up and labs  Message sent to patient to contact office    Requested Prescriptions  Pending Prescriptions Disp Refills   hydrochlorothiazide (HYDRODIURIL) 25 MG tablet 90 tablet 3    Sig: TAKE 1 TABLET BY MOUTH DAILY.     Cardiovascular: Diuretics - Thiazide Failed - 06/09/2021 12:44 PM      Failed - Last BP in normal range    BP Readings from Last 1 Encounters:  10/01/20 140/68          Failed - Valid encounter within last 6 months    Recent Outpatient Visits           8 months ago Type II diabetes mellitus with complication [facility])   [facility]   10 months ago Type II diabetes mellitus with complication [facility])   [facility]   1 year ago Hyperkalemia   [facility], [facility]   2 years ago Annual physical exam   [facility] Gle[facility], MD   2 years ago Type 2 diabetes mellitus with diabetic dermatitis, with long-term current use of insulin [facility])   [facility], [facility]              Passed - Ca in normal range and within 360 days    Calcium  Date Value Ref Range Status  10/01/2020 9.6 8.7 - 10.3 mg/dL Final   Calcium, Total  Date Value Ref Range Status  08/11/2013 8.4 (L) 8.5 - 10.1 mg/dL Final          Passed - Cr in normal range and within 360 days    Creatinine  Date Value Ref Range Status  08/11/2013 1.08 0.60 - 1.30 mg/dL Final   Creatinine, Ser  Date Value Ref Range Status  10/01/2020 0.91 0.57 - 1.00 mg/dL Final   Creatinine, Urine  Date Value Ref Range Status  12/07/2019 21 mg/dL Final    Comment:    Performed at [facility], [address] [place], [place] [address]          Passed - K in normal  range and within 360 days    Potassium  Date Value Ref Range Status  10/01/2020 4.4 3.5 - 5.2 mmol/L Final  08/11/2013 5.7 (H) 3.5 - 5.1 mmol/L Final          Passed - Na in normal range and within 360 days    Sodium  Date Value Ref Range Status  10/01/2020 142 134 - 144 mmol/L Final  08/11/2013 144 136 - 145 mmol/L Final           amLODipine (NORVASC) 5 MG tablet 90 tablet 0    Sig: TAKE 1 TABLET BY MOUTH DAILY.     Cardiovascular:  Calcium Channel Blockers Failed - 06/09/2021 12:44 PM      Failed - Last BP in normal range    BP Readings from Last 1 Encounters:  10/01/20 140/68          Failed - Valid encounter within last 6 months    Recent Outpatient Visits           8 months ago Type II diabetes mellitus with complication ([place])  [facility], [facility]   10 months ago Type II diabetes mellitus with complication [facility])   [facility]   1 year ago Hyperkalemia   [facility] Glean Hess, MD   2 years ago Annual physical exam   [facility], [facility]   2 years ago Type 2 diabetes mellitus with diabetic dermatitis, with long-term current use of insulin [facility])   [facility] Glean Hess, MD

## 2021-06-10 ENCOUNTER — Other Ambulatory Visit: Payer: Self-pay

## 2021-06-12 ENCOUNTER — Other Ambulatory Visit: Payer: Self-pay

## 2021-06-12 MED ORDER — FLUCONAZOLE 200 MG PO TABS: 200.0000 mg | ORAL_TABLET | Freq: Every day | ORAL | 1 refills | Status: DC

## 2021-06-12 MED ORDER — MONISTAT 3 4 % VA CREA: TOPICAL_CREAM | VAGINAL | 1 refills | Status: DC

## 2021-06-12 NOTE — Progress Notes (Signed)
i

## 2021-06-25 ENCOUNTER — Other Ambulatory Visit: Payer: Self-pay

## 2021-06-25 MED ORDER — NOVOFINE PEN NEEDLE 32G X 6 MM MISC
0 refills | Status: DC
  Filled 2021-06-25: qty 100, 30d supply, fill #0

## 2021-07-09 ENCOUNTER — Other Ambulatory Visit: Payer: Self-pay

## 2021-07-09 MED FILL — Insulin Lispro Soln Pen-injector 100 Unit/ML (1 Unit Dial): SUBCUTANEOUS | 90 days supply | Qty: 60 | Fill #1 | Status: AC

## 2021-08-04 ENCOUNTER — Other Ambulatory Visit: Payer: Self-pay

## 2021-08-06 ENCOUNTER — Other Ambulatory Visit: Payer: Self-pay

## 2021-08-25 ENCOUNTER — Other Ambulatory Visit: Payer: Self-pay

## 2021-08-26 ENCOUNTER — Other Ambulatory Visit: Payer: Self-pay

## 2021-08-28 DIAGNOSIS — F4320 Adjustment disorder, unspecified: Secondary | ICD-10-CM | POA: Diagnosis not present

## 2021-09-02 ENCOUNTER — Other Ambulatory Visit: Payer: Self-pay

## 2021-09-02 ENCOUNTER — Other Ambulatory Visit: Payer: Self-pay | Admitting: Internal Medicine

## 2021-09-02 DIAGNOSIS — E118 Type 2 diabetes mellitus with unspecified complications: Secondary | ICD-10-CM

## 2021-09-02 MED FILL — Hydrochlorothiazide Tab 25 MG: ORAL | 90 days supply | Qty: 90 | Fill #1 | Status: AC

## 2021-09-02 NOTE — Telephone Encounter (Signed)
Requested medication (s) are due for refill today: yes  Requested medication (s) are on the active medication list: ye  Last refill: 05/12/21 30 ml 3 refills  Future visit scheduled no  Notes to clinic: off protocol, please review.  Requested Prescriptions  Pending Prescriptions Disp Refills   SEMGLEE, YFGN, 100 UNIT/ML Pen [Pharmacy Med Name: insulin glargine-yfgn (SEMGLEE, YFGN,) 100 UNIT/ML Pen] 30 mL 3    Sig: Inject 60 Units into the skin every morning AND 50 Units every evening.     Off-Protocol Failed - 09/02/2021  2:49 PM      Failed - Medication not assigned to a protocol, review manually.      Passed - Valid encounter within last 12 months    Recent Outpatient Visits           11 months ago Type II diabetes mellitus with complication [facility])   [facility]   1 year ago Type II diabetes mellitus with complication [facility])   [facility]   1 year ago Hyperkalemia   [facility], [facility]   2 years ago Annual physical exam   [facility]   2 years ago Type 2 diabetes mellitus with diabetic dermatitis, with long-term current use of insulin [facility])   [facility] Glean Hess, MD

## 2021-09-03 ENCOUNTER — Other Ambulatory Visit: Payer: Self-pay

## 2021-09-03 MED FILL — Insulin Glargine-yfgn Soln Pen-Injector 100 Unit/ML: SUBCUTANEOUS | 30 days supply | Qty: 30 | Fill #0 | Status: AC

## 2021-09-08 DIAGNOSIS — I10 Essential (primary) hypertension: Secondary | ICD-10-CM | POA: Diagnosis not present

## 2021-09-08 DIAGNOSIS — E1122 Type 2 diabetes mellitus with diabetic chronic kidney disease: Secondary | ICD-10-CM | POA: Diagnosis not present

## 2021-09-08 DIAGNOSIS — E875 Hyperkalemia: Secondary | ICD-10-CM | POA: Diagnosis not present

## 2021-09-08 DIAGNOSIS — N182 Chronic kidney disease, stage 2 (mild): Secondary | ICD-10-CM | POA: Diagnosis not present

## 2021-09-09 ENCOUNTER — Other Ambulatory Visit: Payer: Self-pay

## 2021-09-09 ENCOUNTER — Other Ambulatory Visit: Payer: Self-pay | Admitting: Internal Medicine

## 2021-09-09 DIAGNOSIS — I10 Essential (primary) hypertension: Secondary | ICD-10-CM

## 2021-09-09 DIAGNOSIS — F329 Major depressive disorder, single episode, unspecified: Secondary | ICD-10-CM

## 2021-09-09 DIAGNOSIS — E782 Mixed hyperlipidemia: Secondary | ICD-10-CM

## 2021-09-09 NOTE — Telephone Encounter (Signed)
Requested medication (s) are due for refill today- yes  Requested medication (s) are on the active medication list -yes  Future visit scheduled -no  Last refill: 06/10/21  Notes to clinic: Call to patient- patient request appointment first after lunch- she has to arrange transportation- nothing showing on schedule in appointment type- will send for review and scheduling by office. Attention: Chassidy  Requested Prescriptions  Pending Prescriptions Disp Refills   rosuvastatin (CRESTOR) 5 MG tablet 90 tablet 3    Sig: TAKE 1 TABLET BY MOUTH DAILY.     Cardiovascular:  Antilipid - Statins Failed - 09/09/2021 12:18 PM      Failed - HDL in normal range and within 360 days    HDL  Date Value Ref Range Status  10/01/2020 29 (L) >39 mg/dL Final          Failed - Triglycerides in normal range and within 360 days    Triglycerides  Date Value Ref Range Status  10/01/2020 363 (H) 0 - 149 mg/dL Final          Passed - Total Cholesterol in normal range and within 360 days    Cholesterol, Total  Date Value Ref Range Status  10/01/2020 178 100 - 199 mg/dL Final          Passed - LDL in normal range and within 360 days    LDL Chol Calc (NIH)  Date Value Ref Range Status  10/01/2020 89 0 - 99 mg/dL Final          Passed - Patient is not pregnant      Passed - Valid encounter within last 12 months    Recent Outpatient Visits           11 months ago Type II diabetes mellitus with complication [facility])   [facility]   1 year ago Type II diabetes mellitus with complication [facility])   [facility], [facility]   1 year ago Hyperkalemia   [facility]   2 years ago Annual physical exam   [facility], [facility]   2 years ago Type 2 diabetes mellitus with diabetic dermatitis, with long-term current use of insulin [facility])   [facility]                sertraline (ZOLOFT) 100 MG tablet 90 tablet 1    Sig: TAKE 1 TABLET BY MOUTH DAILY.     Psychiatry:  Antidepressants - SSRI Failed - 09/09/2021 12:18 PM      Failed - Valid encounter within last 6 months    Recent Outpatient Visits           11 months ago Type II diabetes mellitus with complication [facility])   [facility]   1 year ago Type II diabetes mellitus with complication [facility])   [facility]   1 year ago Hyperkalemia   [facility] Gle[facility] Hess, MD   2 years ago Annual physical exam   [facility] Glean Hess, MD   2 years ago Type 2 diabetes mellitus with diabetic dermatitis, with long-term current use of insulin [facility])   [facility]               amLODipine (NORVASC) 5 MG tablet 90 tablet 0  Sig: TAKE 1 TABLET BY MOUTH DAILY.     Cardiovascular:  Calcium Channel Blockers Failed - 09/09/2021 12:18 PM      Failed - Last BP in normal range    BP Readings from Last 1 Encounters:  10/01/20 140/68          Failed - Valid encounter within last 6 months    Recent Outpatient Visits           11 months ago Type II diabetes mellitus with complication [facility])   [facility]   1 year ago Type II diabetes mellitus with complication [facility])   [facility]   1 year ago Hyperkalemia   [facility] Glean Hess, MD   2 years ago Annual physical exam   [facility]   2 years ago Type 2 diabetes mellitus with diabetic dermatitis, with long-term current use of insulin [facility])   [facility], [facility]                 Requested Prescriptions  Pending Prescriptions Disp Refills   rosuvastatin (CRESTOR) 5 MG tablet 90 tablet 3    Sig: TAKE 1 TABLET BY MOUTH DAILY.     Cardiovascular:  Antilipid - Statins Failed - 09/09/2021  12:18 PM      Failed - HDL in normal range and within 360 days    HDL  Date Value Ref Range Status  10/01/2020 29 (L) >39 mg/dL Final          Failed - Triglycerides in normal range and within 360 days    Triglycerides  Date Value Ref Range Status  10/01/2020 363 (H) 0 - 149 mg/dL Final          Passed - Total Cholesterol in normal range and within 360 days    Cholesterol, Total  Date Value Ref Range Status  10/01/2020 178 100 - 199 mg/dL Final          Passed - LDL in normal range and within 360 days    LDL Chol Calc (NIH)  Date Value Ref Range Status  10/01/2020 89 0 - 99 mg/dL Final          Passed - Patient is not pregnant      Passed - Valid encounter within last 12 months    Recent Outpatient Visits           11 months ago Type II diabetes mellitus with complication [facility])   [facility] Gle[facility]   1 year ago Type II diabetes mellitus with complication [facility])   [facility]   1 year ago Hyperkalemia   [facility]   2 years ago Annual physical exam   [facility], MD   2 years ago Type 2 diabetes mellitus with diabetic dermatitis, with long-term current use of insulin [facility])   [facility], [facility]               sertraline (ZOLOFT) 100 MG tablet 90 tablet 1    Sig: TAKE 1 TABLET BY MOUTH DAILY.     Psychiatry:  Antidepressants - SSRI Failed - 09/09/2021 12:18 PM      Failed - Valid encounter within last 6 months    Recent Outpatient Visits  11 months ago Type II diabetes mellitus with complication [facility])   [facility]   1 year ago Type II diabetes mellitus with complication [facility])   [facility]   1 year ago Hyperkalemia   [facility] Glean Hess, MD   2 years ago Annual physical exam   [facility] Glean Hess, MD    2 years ago Type 2 diabetes mellitus with diabetic dermatitis, with long-term current use of insulin [facility])   [facility] Gle[facility], MD               amLODipine (NORVASC) 5 MG tablet 90 tablet 0    Sig: TAKE 1 TABLET BY MOUTH DAILY.     Cardiovascular:  Calcium Channel Blockers Failed - 09/09/2021 12:18 PM      Failed - Last BP in normal range    BP Readings from Last 1 Encounters:  10/01/20 140/68          Failed - Valid encounter within last 6 months    Recent Outpatient Visits           11 months ago Type II diabetes mellitus with complication [facility])   [facility]   1 year ago Type II diabetes mellitus with complication [facility])   [facility]   1 year ago Hyperkalemia   [facility] Glean Hess, MD   2 years ago Annual physical exam   [facility]   2 years ago Type 2 diabetes mellitus with diabetic dermatitis, with long-term current use of insulin [facility])   [facility] Glean Hess, MD

## 2021-09-10 ENCOUNTER — Other Ambulatory Visit: Payer: Self-pay

## 2021-09-10 MED FILL — Sertraline HCl Tab 100 MG: ORAL | 90 days supply | Qty: 90 | Fill #0 | Status: AC

## 2021-09-10 MED FILL — Amlodipine Besylate Tab 5 MG (Base Equivalent): ORAL | 90 days supply | Qty: 90 | Fill #0 | Status: AC

## 2021-09-10 MED FILL — Rosuvastatin Calcium Tab 5 MG: ORAL | 90 days supply | Qty: 90 | Fill #0 | Status: AC

## 2021-09-17 ENCOUNTER — Other Ambulatory Visit: Payer: Self-pay | Admitting: Internal Medicine

## 2021-09-17 ENCOUNTER — Other Ambulatory Visit: Payer: Self-pay

## 2021-09-17 DIAGNOSIS — E109 Type 1 diabetes mellitus without complications: Secondary | ICD-10-CM

## 2021-09-17 DIAGNOSIS — E1165 Type 2 diabetes mellitus with hyperglycemia: Secondary | ICD-10-CM

## 2021-09-18 ENCOUNTER — Other Ambulatory Visit: Payer: Self-pay

## 2021-09-18 MED ORDER — METFORMIN HCL 1000 MG PO TABS
ORAL_TABLET | Freq: Two times a day (BID) | ORAL | 0 refills | Status: DC
  Filled 2021-09-18: qty 180, 90d supply, fill #0

## 2021-09-18 NOTE — Telephone Encounter (Signed)
Requested medications are due for refill today.  yes  Requested medications are on the active medications list.  yes  Last refill. 05/23/2021  Future visit scheduled.   Not with a provider  Notes to clinic.  Patient last seen 10/01/2020 - Pt is more than 3 months overdue for an office visit.

## 2021-09-25 DIAGNOSIS — F4320 Adjustment disorder, unspecified: Secondary | ICD-10-CM | POA: Diagnosis not present

## 2021-09-29 DIAGNOSIS — N182 Chronic kidney disease, stage 2 (mild): Secondary | ICD-10-CM | POA: Diagnosis not present

## 2021-09-29 DIAGNOSIS — E1122 Type 2 diabetes mellitus with diabetic chronic kidney disease: Secondary | ICD-10-CM | POA: Diagnosis not present

## 2021-09-29 DIAGNOSIS — I10 Essential (primary) hypertension: Secondary | ICD-10-CM | POA: Diagnosis not present

## 2021-10-01 ENCOUNTER — Other Ambulatory Visit: Payer: Self-pay

## 2021-10-01 MED FILL — Insulin Glargine-yfgn Soln Pen-Injector 100 Unit/ML: SUBCUTANEOUS | 30 days supply | Qty: 30 | Fill #1 | Status: AC

## 2021-10-02 ENCOUNTER — Other Ambulatory Visit: Payer: Self-pay

## 2021-10-22 ENCOUNTER — Other Ambulatory Visit: Payer: Self-pay

## 2021-10-23 DIAGNOSIS — F4320 Adjustment disorder, unspecified: Secondary | ICD-10-CM | POA: Diagnosis not present

## 2021-10-27 ENCOUNTER — Other Ambulatory Visit: Payer: Self-pay | Admitting: Internal Medicine

## 2021-10-27 ENCOUNTER — Other Ambulatory Visit: Payer: Self-pay

## 2021-10-27 DIAGNOSIS — R21 Rash and other nonspecific skin eruption: Secondary | ICD-10-CM

## 2021-10-27 MED FILL — Insulin Lispro Soln Pen-injector 100 Unit/ML (1 Unit Dial): SUBCUTANEOUS | 90 days supply | Qty: 60 | Fill #2 | Status: AC

## 2021-10-27 MED FILL — Insulin Glargine-yfgn Soln Pen-Injector 100 Unit/ML: SUBCUTANEOUS | 27 days supply | Qty: 30 | Fill #2 | Status: CN

## 2021-10-27 MED FILL — Insulin Glargine-yfgn Soln Pen-Injector 100 Unit/ML: SUBCUTANEOUS | 28 days supply | Qty: 30 | Fill #2 | Status: AC

## 2021-10-27 NOTE — Telephone Encounter (Signed)
Requested medication (s) are due for refill today: NO  Requested medication (s) are on the active medication list: NO  Future visit scheduled: NO  Notes to clinic:  Mycostatin does not have protocol to follow, however, not on current med list, Advil not on med list.  Requested Prescriptions  Pending Prescriptions Disp Refills   nystatin cream (MYCOSTATIN) 60 g 5    Sig: APPLY TO THE AFFECTED AREA(S) TWO TIMES DAILY     Off-Protocol Failed - 10/27/2021 12:29 PM      Failed - Medication not assigned to a protocol, review manually.      Failed - Valid encounter within last 12 months    Recent Outpatient Visits           1 year ago Type II diabetes mellitus with complication [facility])   [facility]   1 year ago Type II diabetes mellitus with complication [facility])   [facility]   1 year ago Hyperkalemia   [facility] Gle[facility] Hess, MD   2 years ago Annual physical exam   [facility] Glean Hess, MD   2 years ago Type 2 diabetes mellitus with diabetic dermatitis, with long-term current use of insulin [facility])   [facility]               ibuprofen (ADVIL) 800 MG tablet 270 tablet 3    Sig: TAKE 1 TABLET BY MOUTH 3 TIMES DAILY     Analgesics:  NSAIDS Failed - 10/27/2021 12:29 PM      Failed - Cr in normal range and within 360 days    Creatinine  Date Value Ref Range Status  08/11/2013 1.08 0.60 - 1.30 mg/dL Final   Creatinine, Ser  Date Value Ref Range Status  10/01/2020 0.91 0.57 - 1.00 mg/dL Final   Creatinine, Urine  Date Value Ref Range Status  12/07/2019 21 mg/dL Final    Comment:    Performed at [facility], [address] [place], [place] [address]          Failed - HGB in normal range and within 360 days    Hemoglobin  Date Value Ref Range Status  12/08/2019 11.1 (L) 12.0 - 15.0 g/dL Final  05/17/2019 11.3 11.1 - 15.9 g/dL Final           Failed - Valid encounter within last 12 months    Recent Outpatient Visits           1 year ago Type II diabetes mellitus with complication [facility])   [facility]   1 year ago Type II diabetes mellitus with complication [facility])   [facility]   1 year ago Hyperkalemia   [facility]   2 years ago Annual physical exam   [facility] Glean Hess, MD   2 years ago Type 2 diabetes mellitus with diabetic dermatitis, with long-term current use of insulin [facility])   [facility] Gle[facility], MD              Passed - Patient is not pregnant

## 2021-10-28 ENCOUNTER — Other Ambulatory Visit: Payer: Self-pay

## 2021-10-28 MED ORDER — NYSTATIN 100000 UNIT/GM EX CREA
TOPICAL_CREAM | Freq: Two times a day (BID) | CUTANEOUS | 5 refills | Status: DC
  Filled 2021-10-28: qty 60, 30d supply, fill #0

## 2021-10-28 MED ORDER — IBUPROFEN 800 MG PO TABS
ORAL_TABLET | Freq: Three times a day (TID) | ORAL | 3 refills | Status: DC
  Filled 2021-10-28: qty 270, 90d supply, fill #0
  Filled 2022-05-11: qty 270, 90d supply, fill #1
  Filled 2022-09-23: qty 270, 90d supply, fill #2

## 2021-11-06 DIAGNOSIS — F4320 Adjustment disorder, unspecified: Secondary | ICD-10-CM | POA: Diagnosis not present

## 2021-11-10 ENCOUNTER — Other Ambulatory Visit: Payer: Self-pay

## 2021-11-20 DIAGNOSIS — F4320 Adjustment disorder, unspecified: Secondary | ICD-10-CM | POA: Diagnosis not present

## 2021-11-21 ENCOUNTER — Other Ambulatory Visit: Payer: Self-pay

## 2021-11-24 ENCOUNTER — Other Ambulatory Visit: Payer: Self-pay

## 2021-11-24 MED FILL — Insulin Glargine-yfgn Soln Pen-Injector 100 Unit/ML: SUBCUTANEOUS | 28 days supply | Qty: 30 | Fill #3 | Status: AC

## 2021-11-26 ENCOUNTER — Other Ambulatory Visit: Payer: Self-pay

## 2021-11-26 MED ORDER — ONDANSETRON HCL 4 MG PO TABS
4.0000 mg | ORAL_TABLET | Freq: Three times a day (TID) | ORAL | 1 refills | Status: AC | PRN
  Filled 2021-11-26: qty 40, 7d supply, fill #0
  Filled 2022-09-23: qty 40, 7d supply, fill #1

## 2021-12-03 ENCOUNTER — Other Ambulatory Visit: Payer: Self-pay | Admitting: Internal Medicine

## 2021-12-03 ENCOUNTER — Other Ambulatory Visit: Payer: Self-pay

## 2021-12-03 NOTE — Telephone Encounter (Signed)
Requested medications are due for refill today.  yes  Requested medications are on the active medications list.  yes  Last refill. 06/09/2021 #90 1 refill  Future visit scheduled.   no  Notes to clinic.  Failed protocol d/t expired labs.    Requested Prescriptions  Pending Prescriptions Disp Refills   hydrochlorothiazide (HYDRODIURIL) 25 MG tablet 90 tablet 1    Sig: TAKE 1 TABLET BY MOUTH DAILY.     Cardiovascular: Diuretics - Thiazide Failed - 12/03/2021 11:42 AM      Failed - Cr in normal range and within 180 days    Creatinine  Date Value Ref Range Status  08/11/2013 1.08 0.60 - 1.30 mg/dL Final   Creatinine, Ser  Date Value Ref Range Status  10/01/2020 0.91 0.57 - 1.00 mg/dL Final   Creatinine, Urine  Date Value Ref Range Status  12/07/2019 21 mg/dL Final    Comment:    Performed at [facility], [address] [place], [place] [address]          Failed - K in normal range and within 180 days    Potassium  Date Value Ref Range Status  10/01/2020 4.4 3.5 - 5.2 mmol/L Final  08/11/2013 5.7 (H) 3.5 - 5.1 mmol/L Final          Failed - Na in normal range and within 180 days    Sodium  Date Value Ref Range Status  10/01/2020 142 134 - 144 mmol/L Final  08/11/2013 144 136 - 145 mmol/L Final          Failed - Last BP in normal range    BP Readings from Last 1 Encounters:  10/01/20 140/68          Failed - Valid encounter within last 6 months    Recent Outpatient Visits           1 year ago Type II diabetes mellitus with complication [facility])   [facility]   1 year ago Type II diabetes mellitus with complication [facility])   [facility] Gle[facility]   1 year ago Hyperkalemia   [facility]   2 years ago Annual physical exam   [facility] Glean Hess, MD   2 years ago Type 2 diabetes mellitus with diabetic dermatitis, with long-term current use of  insulin [facility])   [facility] Glean Hess, MD

## 2021-12-04 ENCOUNTER — Other Ambulatory Visit: Payer: Self-pay

## 2021-12-04 DIAGNOSIS — F4320 Adjustment disorder, unspecified: Secondary | ICD-10-CM | POA: Diagnosis not present

## 2021-12-04 MED FILL — Hydrochlorothiazide Tab 25 MG: ORAL | 90 days supply | Qty: 90 | Fill #0 | Status: AC

## 2021-12-08 ENCOUNTER — Other Ambulatory Visit: Payer: Self-pay

## 2021-12-08 ENCOUNTER — Other Ambulatory Visit: Payer: Self-pay | Admitting: Internal Medicine

## 2021-12-08 MED ORDER — ICOSAPENT ETHYL 1 G PO CAPS
2.0000 g | ORAL_CAPSULE | Freq: Two times a day (BID) | ORAL | 1 refills | Status: DC
  Filled 2021-12-08: qty 360, 90d supply, fill #0
  Filled 2022-06-10: qty 360, 90d supply, fill #1

## 2021-12-09 ENCOUNTER — Other Ambulatory Visit: Payer: Self-pay

## 2021-12-10 ENCOUNTER — Other Ambulatory Visit: Payer: Self-pay

## 2021-12-17 ENCOUNTER — Other Ambulatory Visit: Payer: Self-pay

## 2021-12-17 ENCOUNTER — Other Ambulatory Visit: Payer: Self-pay | Admitting: Internal Medicine

## 2021-12-17 DIAGNOSIS — I10 Essential (primary) hypertension: Secondary | ICD-10-CM

## 2021-12-17 DIAGNOSIS — E118 Type 2 diabetes mellitus with unspecified complications: Secondary | ICD-10-CM

## 2021-12-17 MED FILL — Insulin Glargine-yfgn Soln Pen-Injector 100 Unit/ML: SUBCUTANEOUS | 28 days supply | Qty: 30 | Fill #0 | Status: AC

## 2021-12-17 MED FILL — Sertraline HCl Tab 100 MG: ORAL | 90 days supply | Qty: 90 | Fill #1 | Status: AC

## 2021-12-17 MED FILL — Amlodipine Besylate Tab 5 MG (Base Equivalent): ORAL | 90 days supply | Qty: 90 | Fill #0 | Status: AC

## 2021-12-17 MED FILL — Rosuvastatin Calcium Tab 5 MG: ORAL | 90 days supply | Qty: 90 | Fill #1 | Status: AC

## 2021-12-17 NOTE — Telephone Encounter (Signed)
Requested medications are due for refill today.  yes ? ?Requested medications are on the active medications list.  yes ? ?Last refill. Amlodipine 09/10/2021 #90 0 refills, SEMGLEE11/16/2022 30 3 refills ? ?Future visit scheduled.   no ? ?Notes to clinic.  No protocol for SEMGLEE. Pt last seen 10/01/2020. ? ? ? ?Requested Prescriptions  ?Pending Prescriptions Disp Refills  ? SEMGLEE, YFGN, 100 UNIT/ML Pen [Pharmacy Med Name: insulin glargine-yfgn (SEMGLEE, YFGN,) 100 UNIT/ML Pen] 30 mL 3  ?  Sig: Inject 60 Units into the skin every morning AND 50 Units every evening.  ?  ? Off-Protocol Failed - 12/17/2021  2:51 PM  ?  ?  Failed - Medication not assigned to a protocol, review manually.  ?  ?  Failed - Valid encounter within last 12 months  ?  Recent Outpatient Visits   ? ?      ? 1 year ago Type II diabetes mellitus with complication ([place])  ? [facility] Gle[facility], [facility]  ? 1 year ago Type II diabetes mellitus with complication [facility])  ? [facility] Glean Hess, MD  ? 1 year ago Hyperkalemia  ? [facility] Gle[facility], [facility]  ? 2 years ago Annual physical exam  ? [facility], [facility]  ? 3 years ago Type 2 diabetes mellitus with diabetic dermatitis, with long-term current use of insulin ([place])  ? [facility] Gle[facility] Hess, MD  ? ?  ?  ? ?  ?  ?  ? amLODipine (NORVASC) 5 MG tablet 90 tablet 0  ?  Sig: TAKE 1 TABLET BY MOUTH DAILY.  ?  ? Cardiovascular: Calcium Channel Blockers 2 Failed - 12/17/2021  2:51 PM  ?  ?  Failed - Last BP in normal range  ?  BP Readings from Last 1 Encounters:  ?10/01/20 140/68  ?  ?  ?  ?  Failed - Valid encounter within last 6 months  ?  Recent Outpatient Visits   ? ?      ? 1 year ago Type II diabetes mellitus with complication ([place])  ? [facility]  ? 1 year ago Type II diabetes mellitus with complication [facility])  ? [facility] Gle[facility] Hess, MD  ? 1 year ago Hyperkalemia  ?  [facility] Gle[facility] Hess, [facility]  ? 2 years ago Annual physical exam  ? [facility] Gle[facility], [facility]  ? 3 years ago Type 2 diabetes mellitus with diabetic dermatitis, with long-term current use of insulin ([place])  ? [facility] Glean Hess, MD  ? ?  ?  ? ?  ?  ?  Passed - Last Heart Rate in normal range  ?  Pulse Readings from Last 1 Encounters:  ?10/01/20 74  ?  ?  ?  ?  ?  ?

## 2021-12-18 DIAGNOSIS — F4320 Adjustment disorder, unspecified: Secondary | ICD-10-CM | POA: Diagnosis not present

## 2022-01-01 DIAGNOSIS — F4320 Adjustment disorder, unspecified: Secondary | ICD-10-CM | POA: Diagnosis not present

## 2022-01-05 ENCOUNTER — Other Ambulatory Visit: Payer: Self-pay

## 2022-01-06 ENCOUNTER — Other Ambulatory Visit: Payer: Self-pay

## 2022-01-07 ENCOUNTER — Other Ambulatory Visit: Payer: Self-pay

## 2022-01-07 ENCOUNTER — Ambulatory Visit (INDEPENDENT_AMBULATORY_CARE_PROVIDER_SITE_OTHER): Payer: 59

## 2022-01-07 DIAGNOSIS — Z0000 Encounter for general adult medical examination without abnormal findings: Secondary | ICD-10-CM

## 2022-01-07 NOTE — Progress Notes (Signed)
? ?Subjective:  ? Maria Singh is a 71 y.o. female who presents for Medicare Annual (Subsequent) preventive examination. ? ?Virtual Visit via Telephone Note ? ?I connected with  [name] on 01/07/22 at  1:20 PM EDT by telephone and verified that I am speaking with the correct person using two identifiers. ? ?Location: ?Patient: home ?Provider: [facility] ?Persons participating in the virtual visit: patient/Nurse Health Advisor ?  ?I discussed the limitations, risks, security and privacy concerns of performing an evaluation and management service by telephone and the availability of in person appointments. The patient expressed understanding and agreed to proceed. ? ?Interactive audio and video telecommunications were attempted between this nurse and patient, however failed, due to patient having technical difficulties OR patient did not have access to video capability.  We continued and completed visit with audio only. ? ?Some vital signs may be absent or patient reported.  ? ?Clemetine Marker, LPN ? ? ?Review of Systems    ? ?Cardiac Risk Factors include: advanced age (>68mn, >>75women);diabetes mellitus;dyslipidemia;hypertension;obesity (BMI >30kg/m2);sedentary lifestyle ? ?   ?Objective:  ?  ?There were no vitals filed for this visit. ?There is no height or weight on file to calculate BMI. ? ? ?  01/07/2022  ?  1:35 PM 01/06/2021  ?  1:31 PM 12/27/2019  ? 11:37 AM 12/07/2019  ?  9:37 PM 12/06/2019  ?  6:35 PM 11/25/2018  ? 10:00 PM 11/25/2018  ?  5:18 PM  ?Advanced Directives  ?Does Patient Have a Medical Advance Directive? Yes Yes Yes Yes No Yes No  ?Type of AParamedicof AFeltonLiving will HBrad[place]Living will HDoverLiving will H[place]erfieldLiving will  Healthcare Power of Attorney   ?Does patient want to make changes to medical advance directive?    No - Patient declined  No - Patient declined   ?Copy of [place] Chart? No - copy requested No - copy requested No - copy requested   No - copy requested   ?Would patient like information on creating a medical advance directive?    No - Patient declined  No - Patient declined No - Patient declined  ? ? ?Current Medications (verified) ?Outpatient Encounter Medications as of 01/07/2022  ?Medication Sig  ? Accu-Chek FastClix Lancets MISC TEST ONCE DAILY  ? acetaminophen (TYLENOL) 500 MG tablet Take 500 mg by mouth every 6 (six) hours as needed.  ? amLODipine (NORVASC) 5 MG tablet TAKE 1 TABLET BY MOUTH DAILY.  ? aspirin 81 MG tablet Take 81 mg by mouth daily.  ? Cholecalciferol (VITAMIN D) 50 MCG (2000 UT) tablet Take 2,000 Units by mouth daily.  ? clotrimazole-betamethasone (LOTRISONE) cream Apply 1 application topically 2 (two) times daily. (Patient taking differently: Apply 1 application. topically as needed.)  ? docusate sodium (COLACE) 100 MG capsule Take 100 mg by mouth daily.   ? famotidine (PEPCID) 20 MG tablet Take 1 tablet by mouth daily.  ? ferrous sulfate 325 (65 FE) MG tablet Take 1 tablet (325 mg total) by mouth daily with breakfast.  ? fluconazole (DIFLUCAN) 200 MG tablet Take 1 tablet (200 mg total) by mouth daily.  ? Glucagon (GVOKE HYPOPEN 1-PACK) 1 MG/0.2ML SOAJ Inject 1 each into the skin daily as needed.  ? glucose blood (FREESTYLE LITE) test strip CHECK BLOOD SUGAR TWICE A DAY  ? HUMALOG KWIKPEN 100 UNIT/ML KwikPen INJECT 22 UNITS TOTAL INTO THE SKIN 3 THREE TIMES  DAILY.  ? hydrochlorothiazide (HYDRODIURIL) 25 MG tablet TAKE 1 TABLET BY MOUTH DAILY.  ? ibuprofen (ADVIL) 800 MG tablet TAKE 1 TABLET BY MOUTH 3 TIMES DAILY  ? icosapent Ethyl (VASCEPA) 1 g capsule Take 2 capsules (2 g total) by mouth 2 (two) times daily.  ? insulin glargine-yfgn (SEMGLEE, YFGN,) 100 UNIT/ML Pen Inject 60 Units into the skin every morning AND 50 Units every evening.  ? Insulin Pen Needle (NOVOFINE PEN NEEDLE) 32G X 6 MM MISC Use as directed three times daily  ?  liraglutide (VICTOZA) 18 MG/3ML SOPN INJECT 1.8 MLS (10.8 MG TOTAL) INTO THE SKIN ONCE DAILY  ? loratadine (CLARITIN) 10 MG tablet Take 10 mg by mouth daily.  ? metFORMIN (GLUCOPHAGE) 1000 MG tablet TAKE 1 TABLET BY MOUTH 2 TIMES DAILY  ? mupirocin ointment (BACTROBAN) 2 % Apply to affected area as needed.  ? NON FORMULARY Auto CPap nightly  ? nystatin cream (MYCOSTATIN) APPLY TO THE AFFECTED AREA(S) TWO TIMES DAILY  ? ondansetron (ZOFRAN) 4 MG tablet Take 1-2 tablets (4-8 mg total) by mouth every 8 (eight) hours as needed for nausea or vomiting.  ? rosuvastatin (CRESTOR) 5 MG tablet TAKE 1 TABLET BY MOUTH DAILY.  ? sertraline (ZOLOFT) 100 MG tablet TAKE 1 TABLET BY MOUTH DAILY.  ? sodium bicarbonate 650 MG tablet Take 1 tablet by mouth in the morning and at bedtime.  ? VELTASSA 8.4 g packet Take 1 packet by mouth daily.  ? [DISCONTINUED] MICONAZOLE NITRATE VAGINAL (MONISTAT 3) 4 % CREA Use externally on vagina 3 times a day as needed.  ? patiromer (VELTASSA) 8.4 g packet Take 8.4 g by mouth 1 (one) time each day  ? [DISCONTINUED] guaiFENesin-codeine (ROBITUSSIN AC) 100-10 MG/5ML syrup Take 5 mLs by mouth 3 (three) times daily as needed for cough.  ? [DISCONTINUED] Insulin Pen Needle (UNIFINE PENTIPS) 32G X 6 MM MISC Use as directed 3 times a day.  ? [DISCONTINUED] sodium bicarbonate 650 MG tablet TAKE 2 TABLETS (1,300 MG) BY MOUTH 2 TIMES A DAY  ? ?No facility-administered encounter medications on file as of 01/07/2022.  ? ? ?Allergies (verified) ?Metronidazole and related and Sulfa antibiotics  ? ?History: ?Past Medical History:  ?Diagnosis Date  ? Anemia   ? Charcot Marie Tooth muscular atrophy   ? Chronic kidney disease   ? Depression   ? Diabetes mellitus without complication ([place])   ? GERD (gastroesophageal reflux disease)   ? Hyperlipidemia   ? Hypertension   ? Rotator cuff injury Nov. 2012  ? left arm  ? Sepsis ([place]) 07/16/2018  ? Sepsis ([place]) 07/16/2018  ? Sleep apnea   ? ?Past Surgical History:  ?Procedure  Laterality Date  ? arm surgery Left   ? broken arm  ? FRACTURE SURGERY  1998 ?  ? Left arm  ? HERNIA REPAIR    ? ?Family History  ?Problem Relation Age of Onset  ? Cancer Mother   ? Heart disease Father   ? Diabetes Maternal Grandmother   ? Hypertension Maternal Grandmother   ? Obesity Maternal Grandmother   ? Heart disease Paternal Grandfather   ? Breast cancer Maternal Aunt   ?     mat great aunt  ? ?Social History  ? ?Socioeconomic History  ? Marital status: Married  ?  Spouse name: Not on file  ? Number of children: Not on file  ? Years of education: Not on file  ? Highest education level: Not on file  ?Occupational History  ?  Not on file  ?Tobacco Use  ? Smoking status: Never  ? Smokeless tobacco: Never  ?Vaping Use  ? Vaping Use: Never used  ?Substance and Sexual Activity  ? Alcohol use: Not Currently  ?  Alcohol/week: 0.0 standard drinks  ?  Comment: very very rare  ? Drug use: Never  ? Sexual activity: Not Currently  ?  Birth control/protection: None  ?Other Topics Concern  ? Not on file  ?Social History Narrative  ? Not on file  ? ?Social Determinants of Health  ? ?Financial Resource Strain: Low Risk   ? Difficulty of Paying Living Expenses: Not hard at all  ?Food Insecurity: No Food Insecurity  ? Worried About Charity fundraiser in the Last Year: Never true  ? Ran Out of Food in the Last Year: Never true  ?Transportation Needs: No Transportation Needs  ? Lack of Transportation (Medical): No  ? Lack of Transportation (Non-Medical): No  ?Physical Activity: Inactive  ? Days of Exercise per Week: 0 days  ? Minutes of Exercise per Session: 0 min  ?Stress: No Stress Concern Present  ? Feeling of Stress : Not at all  ?Social Connections: Moderately Integrated  ? Frequency of Communication with Friends and Family: More than three times a week  ? Frequency of Social Gatherings with Friends and Family: Once a week  ? Attends Religious Services: More than 4 times per year  ? Active Member of Clubs or Organizations:  No  ? Attends Archivist Meetings: Never  ? Marital Status: Married  ? ? ?Tobacco Counseling ?Counseling given: Not Answered ? ? ?Clinical Intake: ? ?Pre-visit preparation completed: Yes ? ?Pain : No/denies

## 2022-01-07 NOTE — Patient Instructions (Signed)
Maria Singh , ?Thank you for taking time to come for your Medicare Wellness Visit. I appreciate your ongoing commitment to your health goals. Please review the following plan we discussed and let me know if I can assist you in the future.  ? ?Screening recommendations/referrals: ?Colonoscopy: declined ?Mammogram: done 01/22/21 ?Bone Density: done 01/22/21 ?Recommended yearly ophthalmology/optometry visit for glaucoma screening and checkup ?Recommended yearly dental visit for hygiene and checkup ? ?Vaccinations: ?Influenza vaccine: done 08/08/21 ?Pneumococcal vaccine: done 08/06/20 ?Tdap vaccine: due ?Shingles vaccine: Shingrix discussed. Please contact your pharmacy for coverage information.  ?Covid-19: done 11/23/19, 12/21/19, 08/16/20 & 08/22/21 ? ?Advanced directives: Please bring a copy of your health care power of attorney and living will to the office at your convenience.  ? ?Conditions/risks identified: Recommend continuing fall prevention in the home ? ?Next appointment: Follow up in one year for your annual wellness visit  ? ? ?Preventive Care 64 Years and Older, Female ?Preventive care refers to lifestyle choices and visits with your health care provider that can promote health and wellness. ?What does preventive care include? ?A yearly physical exam. This is also called an annual well check. ?Dental exams once or twice a year. ?Routine eye exams. Ask your health care provider how often you should have your eyes checked. ?Personal lifestyle choices, including: ?Daily care of your teeth and gums. ?Regular physical activity. ?Eating a healthy diet. ?Avoiding tobacco and drug use. ?Limiting alcohol use. ?Practicing safe sex. ?Taking low-dose aspirin every day. ?Taking vitamin and mineral supplements as recommended by your health care provider. ?What happens during an annual well check? ?The services and screenings done by your health care provider during your annual well check will depend on your age, overall health,  lifestyle risk factors, and family history of disease. ?Counseling  ?Your health care provider may ask you questions about your: ?Alcohol use. ?Tobacco use. ?Drug use. ?Emotional well-being. ?Home and relationship well-being. ?Sexual activity. ?Eating habits. ?History of falls. ?Memory and ability to understand (cognition). ?Work and work Statistician. ?Reproductive health. ?Screening  ?You may have the following tests or measurements: ?Height, weight, and BMI. ?Blood pressure. ?Lipid and cholesterol levels. These may be checked every 5 years, or more frequently if you are over 61 years old. ?Skin check. ?Lung cancer screening. You may have this screening every year starting at age 70 if you have a 30-pack-year history of smoking and currently smoke or have quit within the past 15 years. ?Fecal occult blood test (FOBT) of the stool. You may have this test every year starting at age 29. ?Flexible sigmoidoscopy or colonoscopy. You may have a sigmoidoscopy every 5 years or a colonoscopy every 10 years starting at age 97. ?Hepatitis C blood test. ?Hepatitis B blood test. ?Sexually transmitted disease (STD) testing. ?Diabetes screening. This is done by checking your blood sugar (glucose) after you have not eaten for a while (fasting). You may have this done every 1-3 years. ?Bone density scan. This is done to screen for osteoporosis. You may have this done starting at age 59. ?Mammogram. This may be done every 1-2 years. Talk to your health care provider about how often you should have regular mammograms. ?Talk with your health care provider about your test results, treatment options, and if necessary, the need for more tests. ?Vaccines  ?Your health care provider may recommend certain vaccines, such as: ?Influenza vaccine. This is recommended every year. ?Tetanus, diphtheria, and acellular pertussis (Tdap, Td) vaccine. You may need a Td booster every 10 years. ?Zoster  vaccine. You may need this after age  35. ?Pneumococcal 13-valent conjugate (PCV13) vaccine. One dose is recommended after age 110. ?Pneumococcal polysaccharide (PPSV23) vaccine. One dose is recommended after age 40. ?Talk to your health care provider about which screenings and vaccines you need and how often you need them. ?This information is not intended to replace advice given to you by your health care provider. Make sure you discuss any questions you have with your health care provider. ?Document Released: 11/01/2015 Document Revised: 06/24/2016 Document Reviewed: 08/06/2015 ?Elsevier Interactive Patient Education ? 2017 [place]. ? ?Fall Prevention in the Home ?Falls can cause injuries. They can happen to people of all ages. There are many things you can do to make your home safe and to help prevent falls. ?What can I do on the outside of my home? ?Regularly fix the edges of walkways and driveways and fix any cracks. ?Remove anything that might make you trip as you walk through a door, such as a raised step or threshold. ?Trim any bushes or trees on the path to your home. ?Use bright outdoor lighting. ?Clear any walking paths of anything that might make someone trip, such as rocks or tools. ?Regularly check to see if handrails are loose or broken. Make sure that both sides of any steps have handrails. ?Any raised decks and porches should have guardrails on the edges. ?Have any leaves, snow, or ice cleared regularly. ?Use sand or salt on walking paths during winter. ?Clean up any spills in your garage right away. This includes oil or grease spills. ?What can I do in the bathroom? ?Use night lights. ?Install grab bars by the toilet and in the tub and shower. Do not use towel bars as grab bars. ?Use non-skid mats or decals in the tub or shower. ?If you need to sit down in the shower, use a plastic, non-slip stool. ?Keep the floor dry. Clean up any water that spills on the floor as soon as it happens. ?Remove soap buildup in the tub or shower  regularly. ?Attach bath mats securely with double-sided non-slip rug tape. ?Do not have throw rugs and other things on the floor that can make you trip. ?What can I do in the bedroom? ?Use night lights. ?Make sure that you have a light by your bed that is easy to reach. ?Do not use any sheets or blankets that are too big for your bed. They should not hang down onto the floor. ?Have a firm chair that has side arms. You can use this for support while you get dressed. ?Do not have throw rugs and other things on the floor that can make you trip. ?What can I do in the kitchen? ?Clean up any spills right away. ?Avoid walking on wet floors. ?Keep items that you use a lot in easy-to-reach places. ?If you need to reach something above you, use a strong step stool that has a grab bar. ?Keep electrical cords out of the way. ?Do not use floor polish or wax that makes floors slippery. If you must use wax, use non-skid floor wax. ?Do not have throw rugs and other things on the floor that can make you trip. ?What can I do with my stairs? ?Do not leave any items on the stairs. ?Make sure that there are handrails on both sides of the stairs and use them. Fix handrails that are broken or loose. Make sure that handrails are as long as the stairways. ?Check any carpeting to make sure that it  is firmly attached to the stairs. Fix any carpet that is loose or worn. ?Avoid having throw rugs at the top or bottom of the stairs. If you do have throw rugs, attach them to the floor with carpet tape. ?Make sure that you have a light switch at the top of the stairs and the bottom of the stairs. If you do not have them, ask someone to add them for you. ?What else can I do to help prevent falls? ?Wear shoes that: ?Do not have high heels. ?Have rubber bottoms. ?Are comfortable and fit you well. ?Are closed at the toe. Do not wear sandals. ?If you use a stepladder: ?Make sure that it is fully opened. Do not climb a closed stepladder. ?Make sure that  both sides of the stepladder are locked into place. ?Ask someone to hold it for you, if possible. ?Clearly mark and make sure that you can see: ?Any grab bars or handrails. ?First and last steps. ?Where the edge of each st

## 2022-01-15 DIAGNOSIS — E875 Hyperkalemia: Secondary | ICD-10-CM | POA: Diagnosis not present

## 2022-01-15 DIAGNOSIS — N182 Chronic kidney disease, stage 2 (mild): Secondary | ICD-10-CM | POA: Diagnosis not present

## 2022-01-15 DIAGNOSIS — I10 Essential (primary) hypertension: Secondary | ICD-10-CM | POA: Diagnosis not present

## 2022-01-15 DIAGNOSIS — F4320 Adjustment disorder, unspecified: Secondary | ICD-10-CM | POA: Diagnosis not present

## 2022-01-15 DIAGNOSIS — E1122 Type 2 diabetes mellitus with diabetic chronic kidney disease: Secondary | ICD-10-CM | POA: Diagnosis not present

## 2022-01-15 DIAGNOSIS — R809 Proteinuria, unspecified: Secondary | ICD-10-CM | POA: Diagnosis not present

## 2022-01-15 LAB — MICROALBUMIN / CREATININE URINE RATIO: Microalb Creat Ratio: 80

## 2022-01-15 LAB — CBC AND DIFFERENTIAL
HCT: 34 — AB (ref 36–46)
Hemoglobin: 11.3 — AB (ref 12.0–16.0)
Platelets: 189 10*3/uL (ref 150–400)
WBC: 6.5

## 2022-01-15 LAB — BASIC METABOLIC PANEL
BUN: 40 — AB (ref 4–21)
CO2: 17 (ref 13–22)
Chloride: 111 — AB (ref 99–108)
Creatinine: 0.8 (ref 0.5–1.1)
Potassium: 4.9 mEq/L (ref 3.5–5.1)
Sodium: 144 (ref 137–147)

## 2022-01-15 LAB — LIPID PANEL
Cholesterol: 141 (ref 0–200)
HDL: 29 — AB (ref 35–70)
LDL Cholesterol: 69
Triglycerides: 360 — AB (ref 40–160)

## 2022-01-15 LAB — HEMOGLOBIN A1C: Hemoglobin A1C: 6.1

## 2022-01-15 LAB — COMPREHENSIVE METABOLIC PANEL
Calcium: 9.8 (ref 8.7–10.7)
eGFR: 75

## 2022-01-19 ENCOUNTER — Other Ambulatory Visit: Payer: Self-pay

## 2022-01-19 MED FILL — Insulin Glargine-yfgn Soln Pen-Injector 100 Unit/ML: SUBCUTANEOUS | 28 days supply | Qty: 30 | Fill #1 | Status: AC

## 2022-01-20 ENCOUNTER — Other Ambulatory Visit: Payer: Self-pay | Admitting: Internal Medicine

## 2022-01-20 ENCOUNTER — Other Ambulatory Visit: Payer: Self-pay

## 2022-01-20 DIAGNOSIS — E1165 Type 2 diabetes mellitus with hyperglycemia: Secondary | ICD-10-CM

## 2022-01-20 DIAGNOSIS — E109 Type 1 diabetes mellitus without complications: Secondary | ICD-10-CM

## 2022-01-21 ENCOUNTER — Other Ambulatory Visit: Payer: Self-pay

## 2022-01-21 MED FILL — Metformin HCl Tab 1000 MG: ORAL | 90 days supply | Qty: 180 | Fill #0 | Status: AC

## 2022-01-21 NOTE — Telephone Encounter (Signed)
Requested medication (s) are due for refill today: yes ? ?Requested medication (s) are on the active medication list: yes ? ?Last refill:  09/18/21 ? ?Future visit scheduled: in 6 days ? ?Notes to clinic:  needs labs  ? ? ?Requested Prescriptions  ?Pending Prescriptions Disp Refills  ? metFORMIN (GLUCOPHAGE) 1000 MG tablet 180 tablet 0  ?  Sig: TAKE 1 TABLET BY MOUTH 2 TIMES DAILY  ?  ? Endocrinology:  Diabetes - Biguanides Failed - 01/20/2022 12:20 PM  ?  ?  Failed - Cr in normal range and within 360 days  ?  Creatinine  ?Date Value Ref Range Status  ?08/11/2013 1.08 0.60 - 1.30 mg/dL Final  ? ?Creatinine, Ser  ?Date Value Ref Range Status  ?10/01/2020 0.91 0.57 - 1.00 mg/dL Final  ? ?Creatinine, Urine  ?Date Value Ref Range Status  ?12/07/2019 21 mg/dL Final  ?  Comment:  ?  Performed at [facility], [address], [place], [place] [address]  ?  ?  ?  ?  Failed - HBA1C is between 0 and 7.9 and within 180 days  ?  Hemoglobin A1C  ?Date Value Ref Range Status  ?05/06/2021 5.7  Final  ?  ?  ?  ?  Failed - eGFR in normal range and within 360 days  ?  EGFR (African American)  ?Date Value Ref Range Status  ?08/11/2013 >60  Final  ? ?GFR calc Af Wyvonnia Lora  ?Date Value Ref Range Status  ?10/01/2020 75 >59 mL/min/1.73 Final  ?  Comment:  ?  **In accordance with recommendations from the NKF-ASN Task force,** ?  [facility] is in the process of updating its eGFR calculation to the ?  2021 CKD-EPI creatinine equation that estimates kidney function ?  without a race variable. ?  ? ?EGFR (Non-African Amer.)  ?Date Value Ref Range Status  ?08/11/2013 55 (L)  Final  ?  Comment:  ?  eGFR values <80m/min/1.73 m2 may be an indication of chronic ?kidney disease (CKD). ?Calculated eGFR is useful in patients with stable renal function. ?The eGFR calculation will not be reliable in acutely ill patients ?when serum creatinine is changing rapidly. It is not useful in  ?patients on dialysis. The eGFR calculation may not be applicable ?to  patients at the low and high extremes of body sizes, pregnant ?women, and vegetarians. ?  ? ?GFR calc non Af Amer  ?Date Value Ref Range Status  ?10/01/2020 65 >59 mL/min/1.73 Final  ?  ?  ?  ?  Failed - B12 Level in normal range and within 720 days  ?  No results found for: VITAMINB12  ?  ?  ?  Failed - Valid encounter within last 6 months  ?  Recent Outpatient Visits   ? ?      ? 1 year ago [place] diabetes [place] complication [facility]  ? [facility]  ? 1 year ago Type II diabetes mellitus with complication ([facility]  [facility]  ? 2 years [facility]  [facility]  ? 2 years ago [facility] physical exam  [facility]  ? 3 years ago Type 2 diabetes mellitus with diabetic dermatitis, with long-term current use of insulin ([place]  ? [facility]  ? ?  ?  ?Future Appointments   ? ?        ?  In 6 days Glean Hess, MD [facility], [place]  ? ?  ? ?  ?  ?  Failed - CBC within normal limits and completed in the last 12 months  ?  WBC  ?Date Value Ref Range Status  ?12/08/2019 7.3 4.0 - 10.5 K/uL Final  ? ?RBC  ?Date Value Ref Range Status  ?12/08/2019 3.94 3.87 - 5.11 MIL/uL Final  ? ?Hemoglobin  ?Date Value Ref Range Status  ?12/08/2019 11.1 (L) 12.0 - 15.0 g/dL Final  ?05/17/2019 11.3 11.1 - 15.9 g/dL Final  ? ?HCT  ?Date Value Ref Range Status  ?12/08/2019 35.1 (L) 36.0 - 46.0 % Final  ? ?Hematocrit  ?Date Value Ref Range Status  ?05/17/2019 35.0 34.0 - 46.6 % Final  ? ?MCHC  ?Date Value Ref Range Status  ?12/08/2019 31.6 30.0 - 36.0 g/dL Final  ? ?MCH  ?Date Value Ref Range Status  ?12/08/2019 28.2 26.0 - 34.0 pg Final  ? ?MCV  ?Date Value Ref Range Status  ?12/08/2019 89.1 80.0 - 100.0 fL Final  ?05/17/2019 86 79 - 97 fL Final  ?08/09/2013 84 80 - 100 fL Final  ? ?No results found for: PLTCOUNTKUC, LABPLAT, [place] ?RDW  ?Date Value Ref Range  Status  ?12/08/2019 14.9 11.5 - 15.5 % Final  ?05/17/2019 15.1 11.7 - 15.4 % Final  ?08/09/2013 15.3 (H) 11.5 - 14.5 % Final  ? ?  ?  ?  ? ? ? ? ?

## 2022-01-27 ENCOUNTER — Ambulatory Visit: Payer: 59 | Admitting: Internal Medicine

## 2022-01-27 ENCOUNTER — Other Ambulatory Visit: Payer: Self-pay

## 2022-01-27 ENCOUNTER — Encounter: Payer: Self-pay | Admitting: Internal Medicine

## 2022-01-27 VITALS — BP 128/64 | HR 70 | Ht 65.0 in | Wt 305.0 lb

## 2022-01-27 DIAGNOSIS — E1169 Type 2 diabetes mellitus with other specified complication: Secondary | ICD-10-CM

## 2022-01-27 DIAGNOSIS — Z23 Encounter for immunization: Secondary | ICD-10-CM

## 2022-01-27 DIAGNOSIS — Z1231 Encounter for screening mammogram for malignant neoplasm of breast: Secondary | ICD-10-CM

## 2022-01-27 DIAGNOSIS — E118 Type 2 diabetes mellitus with unspecified complications: Secondary | ICD-10-CM

## 2022-01-27 DIAGNOSIS — L98491 Non-pressure chronic ulcer of skin of other sites limited to breakdown of skin: Secondary | ICD-10-CM | POA: Diagnosis not present

## 2022-01-27 DIAGNOSIS — N2589 Other disorders resulting from impaired renal tubular function: Secondary | ICD-10-CM

## 2022-01-27 DIAGNOSIS — G600 Hereditary motor and sensory neuropathy: Secondary | ICD-10-CM

## 2022-01-27 DIAGNOSIS — E785 Hyperlipidemia, unspecified: Secondary | ICD-10-CM

## 2022-01-27 DIAGNOSIS — Z6841 Body Mass Index (BMI) 40.0 and over, adult: Secondary | ICD-10-CM

## 2022-01-27 DIAGNOSIS — B372 Candidiasis of skin and nail: Secondary | ICD-10-CM

## 2022-01-27 DIAGNOSIS — I10 Essential (primary) hypertension: Secondary | ICD-10-CM

## 2022-01-27 DIAGNOSIS — R490 Dysphonia: Secondary | ICD-10-CM

## 2022-01-27 DIAGNOSIS — Z1211 Encounter for screening for malignant neoplasm of colon: Secondary | ICD-10-CM

## 2022-01-27 LAB — POCT WET PREP WITH KOH
KOH Prep POC: NEGATIVE
RBC Wet Prep HPF POC: 0
Trichomonas, UA: NEGATIVE

## 2022-01-27 NOTE — Progress Notes (Addendum)
Date:  01/27/2022   Name:  Maria Singh   DOB:  01/19/1951   MRN:  [id number]   Chief Complaint: Diabetes, Hypertension, Vaginal Bleeding, Rectal Bleeding, and Annual Exam Mammogram:  01/2021 DEXA:  01/2021 Normal CRC screening - unable to obtain colonoscopy due to physical status/inability to do an adequate prep  Hypertension This is a chronic problem. The problem is controlled. Pertinent negatives include no chest pain. Past treatments include diuretics and calcium channel blockers. There is no history of kidney disease, CAD/MI or CVA.  Diabetes She presents for her follow-up diabetic visit. She has type 2 diabetes mellitus. Her disease course has been stable. Pertinent negatives for hypoglycemia include no nervousness/anxiousness. Pertinent negatives for diabetes include no chest pain. Pertinent negatives for diabetic complications include no CVA. Current diabetic treatments: metformin, Victoza, Semglee and Humalog.  Hyperlipidemia This is a chronic problem. The problem is controlled. Pertinent negatives include no chest pain. Current antihyperlipidemic treatment includes statins.   Bleeding - two weeks ago noticed bright red blood on her underclothes and could not tell the source.  No dysuria, diarrhea, rectal pain or known injury.  She has not seen any further bleeding since then.  She thinks she may have the bleeding while trying to scoot from her chair to the toilet.  Lab Results  Component Value Date   NA 137 02/09/2022   K 5.1 02/09/2022   CO2 23 02/09/2022   GLUCOSE 72 02/09/2022   BUN 36 (H) 02/09/2022   CREATININE 0.77 02/09/2022   CALCIUM 9.5 02/09/2022   EGFR 75 01/15/2022   GFRNONAA >60 02/09/2022   Lab Results  Component Value Date   CHOL 168 02/09/2022   HDL 34 (L) 02/09/2022   LDLCALC UNABLE TO CALCULATE IF TRIGLYCERIDE OVER 400 mg/dL 02/09/2022   LDLDIRECT 90.2 02/09/2022   TRIG 484 (H) 02/09/2022   CHOLHDL 4.9 02/09/2022   Lab Results  Component  Value Date   TSH 5.990 (H) 05/17/2019   Lab Results  Component Value Date   HGBA1C 5.6 05/12/2022   Lab Results  Component Value Date   WBC 6.5 01/15/2022   HGB 11.3 (A) 01/15/2022   HCT 34 (A) 01/15/2022   MCV 89.1 12/08/2019   PLT 189 01/15/2022   Lab Results  Component Value Date   ALT 31 02/09/2022   AST 28 02/09/2022   ALKPHOS 25 (L) 02/09/2022   BILITOT 0.2 (L) 02/09/2022   Lab Results  Component Value Date   VD25OH 18.8 (L) 10/01/2020     Review of Systems  Constitutional:  Negative for chills and fever.  HENT:  Positive for voice change.   Eyes:  Negative for visual disturbance.  Respiratory:  Negative for cough and chest tightness.   Cardiovascular:  Positive for leg swelling. Negative for chest pain.  Gastrointestinal:  Positive for abdominal distention (unbilical hernia). Negative for abdominal pain, blood in stool, diarrhea and nausea.  Genitourinary:  Negative for genital sores and hematuria.       Bleeding from genital region - 2 episodes, small amount  Musculoskeletal:  Positive for gait problem.  Psychiatric/Behavioral:  Negative for dysphoric mood and sleep disturbance. The patient is not nervous/anxious.     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   Hyperkalemia  12/06/2019   Morbid obesity with BMI of 50.0-59.9, adult ([place]) 12/06/2019   Ambulatory dysfunction 12/06/2019   Type II diabetes mellitus with complication ([place]) 94/76/5465   Iron deficiency anemia 12/13/2018   OSA on CPAP 11/25/2018   LPRD (laryngopharyngeal reflux disease) 04/13/2018   Hyperlipidemia associated with type 2 diabetes mellitus ([place]) 08/10/2017   Essential hypertension 08/10/2017   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   Left arm   HERNIA REPAIR      Social History   Tobacco Use   Smoking status: Never   Smokeless tobacco: Never  Vaping Use   Vaping Use: Never used  Substance Use Topics   Alcohol use: Not Currently    Alcohol/week: 0.0 standard drinks of alcohol    Comment: very very rare   Drug use: Never     Medication list has been reviewed and updated.  Current Meds  Medication Sig   Accu-Chek FastClix Lancets MISC TEST ONCE DAILY   acetaminophen (TYLENOL) 500 MG tablet Take 500 mg by mouth every 6 (six) hours as needed.   aspirin 81 MG tablet Take 81 mg by mouth daily.   Cholecalciferol (VITAMIN D) 50 MCG (2000 UT) tablet Take 2,000 Units by mouth daily.   clotrimazole-betamethasone (LOTRISONE) cream Apply 1 application topically 2 (two) times daily. (Patient taking differently: Apply 1 application  topically as needed.)   docusate sodium (COLACE) 100 MG capsule Take 100 mg by mouth daily.    ferrous sulfate 325 (65 FE) MG tablet Take 1 tablet (325 mg total) by mouth daily with breakfast.   fluconazole (DIFLUCAN) 200 MG tablet Take 1 tablet (200 mg total) by mouth daily. (Patient taking differently: Take 200 mg by mouth as needed.)   Glucagon (GVOKE HYPOPEN 1-PACK) 1 MG/0.2ML SOAJ Inject 1 each into the skin daily as needed.   [EXPIRED] glucose blood (FREESTYLE LITE) test strip CHECK BLOOD SUGAR TWICE A DAY   hydrochlorothiazide (HYDRODIURIL) 25 MG tablet TAKE 1 TABLET BY MOUTH DAILY.   ibuprofen (ADVIL) 800 MG tablet TAKE 1 TABLET BY MOUTH 3 TIMES DAILY (Patient taking differently: Take by mouth in the morning and at bedtime.)   icosapent Ethyl (VASCEPA) 1 g capsule Take 2 capsules (2 g total) by mouth 2 (two) times daily.    loratadine (CLARITIN) 10 MG tablet Take 10 mg by mouth daily.   mupirocin ointment (BACTROBAN) 2 % Apply to affected area as needed.   NON FORMULARY Auto CPap nightly   ondansetron (ZOFRAN) 4 MG tablet Take 1-2 tablets (4-8 mg total) by mouth every 8 (eight) hours as needed for nausea or vomiting.   patiromer (VELTASSA) 8.4 g packet Take 8.4 g by mouth 1 (one) time each day   rosuvastatin (CRESTOR) 5 MG tablet TAKE 1 TABLET BY MOUTH DAILY.   sodium bicarbonate 650 MG tablet Take 1 tablet by mouth in the morning and at bedtime.   [DISCONTINUED] amLODipine (NORVASC) 5 MG tablet TAKE 1 TABLET BY MOUTH DAILY.   [DISCONTINUED] famotidine (PEPCID) 20 MG tablet Take 1 tablet by mouth daily.   [DISCONTINUED] insulin glargine-yfgn (SEMGLEE, YFGN,) 100 UNIT/ML Pen Inject 60 Units into the skin every morning AND 50 Units every  evening.   [DISCONTINUED] Insulin Pen Needle (NOVOFINE PEN NEEDLE) 32G X 6 MM MISC Use as directed three times daily   [DISCONTINUED] liraglutide (VICTOZA) 18 MG/3ML SOPN INJECT 1.8 MLS (10.8 MG TOTAL) INTO THE SKIN ONCE DAILY   [DISCONTINUED] metFORMIN (GLUCOPHAGE) 1000 MG tablet TAKE 1 TABLET BY MOUTH 2 TIMES DAILY   [DISCONTINUED] nystatin cream (MYCOSTATIN) APPLY TO THE AFFECTED AREA(S) TWO TIMES DAILY   [DISCONTINUED] sertraline (ZOLOFT) 100 MG tablet TAKE 1 TABLET BY MOUTH DAILY.       01/27/2022    2:27 PM 10/01/2020    3:42 PM 08/06/2020   11:10 AM  GAD 7 : Generalized Anxiety Score  Nervous, Anxious, on Edge 0 0 0  Control/stop worrying 0 0 0  Worry too much - different things 0 0 0  Trouble relaxing 0 0 0  Restless 0 0 0  Easily annoyed or irritable 0 0 0  Afraid - awful might happen 0 0 0  Total GAD 7 Score 0 0 0  Anxiety Difficulty Not difficult at all  Not difficult at all       01/27/2022    2:27 PM  Depression screen PHQ 2/9  Decreased Interest 0  Down, Depressed, Hopeless 0  PHQ - 2 Score 0  Altered sleeping 0  Tired, decreased energy 0  Change in  appetite 0  Feeling bad or failure about yourself  0  Trouble concentrating 0  Moving slowly or fidgety/restless 0  Suicidal thoughts 0  PHQ-9 Score 0  Difficult doing work/chores Not difficult at all    BP Readings from Last 3 Encounters:  05/25/22 (!) 148/62  05/12/22 124/70  01/27/22 128/64    Physical Exam Vitals and nursing note reviewed. Exam conducted with a chaperone present.  Constitutional:      General: She is not in acute distress.    Appearance: She is well-developed.  HENT:     Head: Normocephalic and atraumatic.  Cardiovascular:     Rate and Rhythm: Normal rate and regular rhythm.  Pulmonary:     Effort: Pulmonary effort is normal. No respiratory distress.     Breath sounds: No wheezing or rhonchi.  Chest:  Breasts:    Right: Normal.     Left: Normal.  Abdominal:     Tenderness: There is no abdominal tenderness. There is no guarding.     Hernia: A hernia is present. There is no hernia in the left inguinal area or right inguinal area.  Genitourinary:    Labia:        Right: No tenderness, lesion or injury.        Left: No tenderness, lesion or injury.      Vagina: Normal. No foreign body. No vaginal discharge, erythema, tenderness or bleeding.     Rectum: Normal. Guaiac result negative. No mass, anal fissure, external hemorrhoid or internal hemorrhoid.       Comments: Shallow ulceration without bleeding in the perineal area Musculoskeletal:     Cervical back: Normal range of motion.  Skin:    General: Skin is warm and dry.     Findings: Rash present.     Comments: Tinea rash under both breasts and in abdominal fold  Neurological:     Mental Status: She is alert and oriented to person, place, and time.  Psychiatric:        Mood and Affect: Mood normal.        Behavior: Behavior normal.     Wt Readings from Last  3 Encounters:  05/12/22 (!) 305 lb (138.3 kg)  01/27/22 (!) 305 lb (138.3 kg)  12/27/19 (!) 305 lb (138.3 kg)    BP 128/64   Pulse  70   Ht _0  (1.651 m)   Wt (!) 305 lb (138.3 kg)   SpO2 95%   BMI 50.75 kg/m   Assessment and Plan: 1. Perineal ulcer, limited to breakdown of skin ([place]) Likely the cause of the limited bleeding Local care only advised Avoid friction when transferring.  2. Essential hypertension Clinically stable exam with well controlled BP. Tolerating medications without side effects at this time. Pt to continue current regimen and low sodium diet; benefits of regular exercise as able discussed.  3. Type II diabetes mellitus with complication (HCC) Recent A1C was excellent. Continue current medications.  4. Hyperlipidemia associated with type 2 diabetes mellitus ([place]) Will check lipids and triglycerides She recent resume Vascepa 4 caps daily - Lipid panel - Comprehensive metabolic panel  5. 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 BMI 50.0-59.9, adult (HCC) Due to obesity, along with neurologic dysfunction, she is wheel chair bound She requires a [place] lift at home and would benefit from a hospital bed and a trapeze bar to aid in transfers and positioning.  7. Yeast dermatitis Continue topical care. Can use diflucan 100 mg every other day for three doses No evidence of yeast on wet prep - POCT Wet Prep with KOH  8. Encounter for screening mammogram for breast cancer Due for annual screening - MM 3D SCREEN BREAST BILATERAL  9. Hoarseness of voice Recommend ENT Can also get hearing evaluated - Ambulatory referral to ENT  10. 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 Colon cancer screening This is difficult to accomplish due to limited mobility Will defer for now   Partially dictated using  Editor, commissioning. Any errors are unintentional.  Halina Maidens, MD [facility]  05/25/2022

## 2022-01-28 ENCOUNTER — Other Ambulatory Visit: Payer: Self-pay

## 2022-01-29 DIAGNOSIS — F4320 Adjustment disorder, unspecified: Secondary | ICD-10-CM | POA: Diagnosis not present

## 2022-02-09 ENCOUNTER — Other Ambulatory Visit
Admission: RE | Admit: 2022-02-09 | Discharge: 2022-02-09 | Disposition: A | Discharge status: Home / Self Care | Payer: 59 | Attending: Internal Medicine | Admitting: Internal Medicine

## 2022-02-09 DIAGNOSIS — E1169 Type 2 diabetes mellitus with other specified complication: Secondary | ICD-10-CM | POA: Diagnosis not present

## 2022-02-09 DIAGNOSIS — E785 Hyperlipidemia, unspecified: Secondary | ICD-10-CM | POA: Diagnosis not present

## 2022-02-09 LAB — COMPREHENSIVE METABOLIC PANEL
ALT: 31 U/L (ref 0–44)
AST: 28 U/L (ref 15–41)
Albumin: 4.1 g/dL (ref 3.5–5.0)
Alkaline Phosphatase: 25 U/L — ABNORMAL LOW (ref 38–126)
Anion gap: 7 (ref 5–15)
BUN: 36 mg/dL — ABNORMAL HIGH (ref 8–23)
CO2: 23 mmol/L (ref 22–32)
Calcium: 9.5 mg/dL (ref 8.9–10.3)
Chloride: 107 mmol/L (ref 98–111)
Creatinine, Ser: 0.77 mg/dL (ref 0.44–1.00)
GFR, Estimated: >60 mL/min (ref >60)
Glucose, Bld: 72 mg/dL (ref 70–99)
Potassium: 5.1 mmol/L (ref 3.5–5.1)
Sodium: 137 mmol/L (ref 135–145)
Total Bilirubin: 0.2 mg/dL — ABNORMAL LOW (ref 0.3–1.2)
Total Protein: 7.5 g/dL (ref 6.5–8.1)

## 2022-02-09 LAB — LIPID PANEL
Cholesterol: 168 mg/dL (ref 0–200)
HDL: 34 mg/dL — ABNORMAL LOW (ref >40)
LDL Cholesterol: UNDETERMINED mg/dL (ref 0–99)
Total CHOL/HDL Ratio: 4.9 RATIO
Triglycerides: 484 mg/dL — ABNORMAL HIGH (ref <150)
VLDL: UNDETERMINED mg/dL (ref 0–40)

## 2022-02-10 ENCOUNTER — Other Ambulatory Visit: Payer: Self-pay | Admitting: Internal Medicine

## 2022-02-10 DIAGNOSIS — E119 Type 2 diabetes mellitus without complications: Secondary | ICD-10-CM

## 2022-02-10 DIAGNOSIS — E118 Type 2 diabetes mellitus with unspecified complications: Secondary | ICD-10-CM

## 2022-02-10 LAB — LDL CHOLESTEROL, DIRECT: Direct LDL: 90.2 mg/dL (ref 0–99)

## 2022-02-10 MED ORDER — DEXCOM G7 RECEIVER DEVI: 1.0000 | 0 refills | Status: DC

## 2022-02-10 MED ORDER — DEXCOM G7 SENSOR MISC: 1.0000 | 5 refills | Status: DC

## 2022-02-12 DIAGNOSIS — F4320 Adjustment disorder, unspecified: Secondary | ICD-10-CM | POA: Diagnosis not present

## 2022-02-16 ENCOUNTER — Other Ambulatory Visit: Payer: Self-pay

## 2022-02-16 ENCOUNTER — Other Ambulatory Visit: Payer: Self-pay | Admitting: Internal Medicine

## 2022-02-16 DIAGNOSIS — E109 Type 1 diabetes mellitus without complications: Secondary | ICD-10-CM

## 2022-02-16 MED ORDER — OZEMPIC (0.25 OR 0.5 MG/DOSE) 2 MG/3ML ~~LOC~~ SOPN
1.0000 mg | PEN_INJECTOR | SUBCUTANEOUS | 3 refills | Status: DC
  Filled 2022-02-16: qty 9, 84d supply, fill #0
  Filled 2022-07-15: qty 9, 84d supply, fill #1
  Filled 2022-10-17: qty 9, 84d supply, fill #2

## 2022-02-16 MED ORDER — UNIFINE PENTIPS 31G X 6 MM MISC
0 refills | Status: DC
Start: 2022-02-16 — End: 2022-07-03
  Filled 2022-02-16: qty 100, 30d supply, fill #0

## 2022-02-16 MED ORDER — HUMALOG KWIKPEN 100 UNIT/ML ~~LOC~~ SOPN
PEN_INJECTOR | SUBCUTANEOUS | 87 refills | Status: DC
  Filled 2022-02-16: qty 60, 90d supply, fill #0
  Filled 2022-06-16: qty 60, 90d supply, fill #1
  Filled 2022-09-23 – 2022-10-17 (×2): qty 60, 90d supply, fill #2

## 2022-02-16 MED ORDER — SODIUM BICARBONATE 650 MG PO TABS
ORAL_TABLET | ORAL | 11 refills | Status: DC
Start: 2022-02-16 — End: 2023-03-22
  Filled 2022-02-16: qty 120, 30d supply, fill #0
  Filled 2022-04-24: qty 120, 30d supply, fill #1
  Filled 2022-06-10: qty 120, 30d supply, fill #2
  Filled 2022-07-07: qty 120, 30d supply, fill #3
  Filled 2022-08-31: qty 120, 30d supply, fill #4
  Filled 2022-09-23 – 2022-10-14 (×2): qty 120, 30d supply, fill #5
  Filled 2022-11-24: qty 120, 30d supply, fill #6
  Filled 2022-12-18: qty 120, 30d supply, fill #7
  Filled 2023-02-09: qty 120, 30d supply, fill #8

## 2022-02-16 MED FILL — Insulin Glargine-yfgn Soln Pen-Injector 100 Unit/ML: SUBCUTANEOUS | 28 days supply | Qty: 30 | Fill #2 | Status: AC

## 2022-02-17 ENCOUNTER — Other Ambulatory Visit: Payer: Self-pay

## 2022-02-18 ENCOUNTER — Other Ambulatory Visit: Payer: Self-pay

## 2022-02-26 DIAGNOSIS — F4320 Adjustment disorder, unspecified: Secondary | ICD-10-CM | POA: Diagnosis not present

## 2022-03-11 ENCOUNTER — Other Ambulatory Visit: Payer: Self-pay

## 2022-03-11 MED FILL — Hydrochlorothiazide Tab 25 MG: ORAL | 90 days supply | Qty: 90 | Fill #1 | Status: AC

## 2022-03-17 ENCOUNTER — Other Ambulatory Visit: Payer: Self-pay

## 2022-03-17 ENCOUNTER — Other Ambulatory Visit: Payer: Self-pay | Admitting: Internal Medicine

## 2022-03-17 DIAGNOSIS — I10 Essential (primary) hypertension: Secondary | ICD-10-CM

## 2022-03-17 DIAGNOSIS — F329 Major depressive disorder, single episode, unspecified: Secondary | ICD-10-CM

## 2022-03-17 MED FILL — Rosuvastatin Calcium Tab 5 MG: ORAL | 90 days supply | Qty: 90 | Fill #2 | Status: AC

## 2022-03-17 MED FILL — Insulin Glargine-yfgn Soln Pen-Injector 100 Unit/ML: SUBCUTANEOUS | 28 days supply | Qty: 30 | Fill #3 | Status: AC

## 2022-03-18 ENCOUNTER — Other Ambulatory Visit: Payer: Self-pay | Admitting: Internal Medicine

## 2022-03-18 ENCOUNTER — Other Ambulatory Visit: Payer: Self-pay

## 2022-03-18 DIAGNOSIS — F329 Major depressive disorder, single episode, unspecified: Secondary | ICD-10-CM

## 2022-03-18 DIAGNOSIS — I10 Essential (primary) hypertension: Secondary | ICD-10-CM

## 2022-03-18 MED ORDER — AMLODIPINE BESYLATE 5 MG PO TABS
ORAL_TABLET | Freq: Every day | ORAL | 3 refills | Status: DC
  Filled 2022-03-18: qty 90, 90d supply, fill #0
  Filled 2022-06-23: qty 90, 90d supply, fill #1
  Filled 2022-09-23: qty 90, 90d supply, fill #2
  Filled 2022-12-18: qty 90, 90d supply, fill #3

## 2022-03-18 MED ORDER — SERTRALINE HCL 100 MG PO TABS
ORAL_TABLET | Freq: Every day | ORAL | 3 refills | Status: DC
  Filled 2022-03-18: qty 90, 90d supply, fill #0
  Filled 2022-06-23: qty 90, 90d supply, fill #1
  Filled 2022-09-23: qty 90, 90d supply, fill #2
  Filled 2022-12-18: qty 90, 90d supply, fill #3

## 2022-03-19 DIAGNOSIS — F4320 Adjustment disorder, unspecified: Secondary | ICD-10-CM | POA: Diagnosis not present

## 2022-03-20 ENCOUNTER — Other Ambulatory Visit: Payer: Self-pay

## 2022-03-31 DIAGNOSIS — K219 Gastro-esophageal reflux disease without esophagitis: Secondary | ICD-10-CM | POA: Diagnosis not present

## 2022-03-31 DIAGNOSIS — R490 Dysphonia: Secondary | ICD-10-CM | POA: Diagnosis not present

## 2022-03-31 DIAGNOSIS — H903 Sensorineural hearing loss, bilateral: Secondary | ICD-10-CM | POA: Diagnosis not present

## 2022-04-01 ENCOUNTER — Other Ambulatory Visit: Payer: Self-pay

## 2022-04-01 ENCOUNTER — Other Ambulatory Visit: Payer: Self-pay | Admitting: Internal Medicine

## 2022-04-01 ENCOUNTER — Encounter: Payer: Self-pay | Admitting: Internal Medicine

## 2022-04-01 DIAGNOSIS — K219 Gastro-esophageal reflux disease without esophagitis: Secondary | ICD-10-CM

## 2022-04-01 MED ORDER — PANTOPRAZOLE SODIUM 40 MG PO TBEC
40.0000 mg | DELAYED_RELEASE_TABLET | Freq: Every day | ORAL | 3 refills | Status: DC
  Filled 2022-04-01: qty 90, 90d supply, fill #0
  Filled 2022-06-23: qty 90, 90d supply, fill #1
  Filled 2022-09-23: qty 90, 90d supply, fill #2
  Filled 2023-01-06: qty 90, 90d supply, fill #3

## 2022-04-02 ENCOUNTER — Other Ambulatory Visit: Payer: Self-pay

## 2022-04-02 DIAGNOSIS — F4320 Adjustment disorder, unspecified: Secondary | ICD-10-CM | POA: Diagnosis not present

## 2022-04-08 ENCOUNTER — Other Ambulatory Visit: Payer: Self-pay | Admitting: Internal Medicine

## 2022-04-08 ENCOUNTER — Other Ambulatory Visit: Payer: Self-pay

## 2022-04-08 DIAGNOSIS — E118 Type 2 diabetes mellitus with unspecified complications: Secondary | ICD-10-CM

## 2022-04-09 ENCOUNTER — Other Ambulatory Visit: Payer: Self-pay

## 2022-04-09 DIAGNOSIS — F4320 Adjustment disorder, unspecified: Secondary | ICD-10-CM | POA: Diagnosis not present

## 2022-04-09 MED FILL — Insulin Glargine-yfgn Soln Pen-Injector 100 Unit/ML: SUBCUTANEOUS | 28 days supply | Qty: 30 | Fill #0 | Status: AC

## 2022-04-09 NOTE — Telephone Encounter (Signed)
Requested medication (s) are due for refill today - yes  Requested medication (s) are on the active medication list -yes  Future visit scheduled -no  Last refill: 12/17/21 69m 3RF  Notes to clinic: no assigned protocol  Requested Prescriptions  Pending Prescriptions Disp Refills   SEMGLEE, YFGN, 100 UNIT/ML Pen [Pharmacy Med Name: insulin glargine-yfgn (SEMGLEE, YFGN,) 100 UNIT/ML Pen] 30 mL 3    Sig: Inject 60 Units into the skin every morning AND 50 Units every evening.     Off-Protocol Failed - 04/08/2022  3:58 PM      Failed - Medication not assigned to a protocol, review manually.      Passed - Valid encounter within last 12 months    Recent Outpatient Visits           2 months ago Perineal ulcer, limited to breakdown of skin ([facility]   1 year ago Type II diabetes mellitus with complication ([place]   [facility]   1 year ago Type II diabetes mellitus with complication ([facility]   [facility]   2 years [facility]       Future Appointments             In 6 months [place]ene [place]athe [place] [facility]              Requested Prescriptions  Pending Prescriptions Disp Refills   SEMGLEE, YFGN, 100 UNIT/ML Pen [Pharmacy Med Name: insulin glargine-yfgn (SEMGLEE, YFGN,) 100 UNIT/ML Pen] 30 mL 3    Sig: Inject 60 Units into the skin every morning AND 50 Units every evening.     Off-Protocol Failed - 04/08/2022  3:58 PM      Failed - Medication not assigned to a protocol, review manually.      Passed - Valid encounter within last 12 months    Recent Outpatient Visits           2 months ago Perineal ulcer, limited to breakdown of skin ([facility]   [facility]   1 year ago Type II diabetes mellitus with  complication ([facility]   [facility]   1 year ago Type II diabetes mellitus with complication ([facility]   [facility]   [facility]   2 years ago [facility]       Future Appointments             In 6 months [place]ene [place]

## 2022-04-14 DIAGNOSIS — H903 Sensorineural hearing loss, bilateral: Secondary | ICD-10-CM | POA: Diagnosis not present

## 2022-04-14 DIAGNOSIS — H6123 Impacted cerumen, bilateral: Secondary | ICD-10-CM | POA: Diagnosis not present

## 2022-04-14 DIAGNOSIS — K219 Gastro-esophageal reflux disease without esophagitis: Secondary | ICD-10-CM | POA: Diagnosis not present

## 2022-04-15 ENCOUNTER — Other Ambulatory Visit: Payer: Self-pay | Admitting: Internal Medicine

## 2022-04-15 ENCOUNTER — Other Ambulatory Visit: Payer: Self-pay

## 2022-04-15 DIAGNOSIS — L03119 Cellulitis of unspecified part of limb: Secondary | ICD-10-CM

## 2022-04-15 MED ORDER — AMOXICILLIN-POT CLAVULANATE 875-125 MG PO TABS
1.0000 | ORAL_TABLET | Freq: Two times a day (BID) | ORAL | 0 refills | Status: AC
  Filled 2022-04-15: qty 20, 10d supply, fill #0

## 2022-04-22 ENCOUNTER — Encounter: Payer: Self-pay | Admitting: Internal Medicine

## 2022-04-22 DIAGNOSIS — R600 Localized edema: Secondary | ICD-10-CM | POA: Insufficient documentation

## 2022-04-24 ENCOUNTER — Other Ambulatory Visit: Payer: Self-pay

## 2022-04-30 DIAGNOSIS — F4320 Adjustment disorder, unspecified: Secondary | ICD-10-CM | POA: Diagnosis not present

## 2022-05-05 ENCOUNTER — Other Ambulatory Visit: Payer: Self-pay | Admitting: Internal Medicine

## 2022-05-05 DIAGNOSIS — L03119 Cellulitis of unspecified part of limb: Secondary | ICD-10-CM

## 2022-05-05 DIAGNOSIS — R238 Other skin changes: Secondary | ICD-10-CM

## 2022-05-05 DIAGNOSIS — R109 Unspecified abdominal pain: Secondary | ICD-10-CM

## 2022-05-05 MED ORDER — CEPHALEXIN 500 MG PO CAPS: 500.0000 mg | ORAL_CAPSULE | Freq: Four times a day (QID) | ORAL | 0 refills | Status: DC

## 2022-05-07 ENCOUNTER — Other Ambulatory Visit: Payer: Self-pay | Admitting: Internal Medicine

## 2022-05-07 DIAGNOSIS — R21 Rash and other nonspecific skin eruption: Secondary | ICD-10-CM

## 2022-05-07 MED ORDER — NYSTATIN 100000 UNIT/GM EX CREA
TOPICAL_CREAM | Freq: Two times a day (BID) | CUTANEOUS | 5 refills | Status: DC
Start: 2022-05-07 — End: 2022-07-30

## 2022-05-11 ENCOUNTER — Other Ambulatory Visit: Payer: Self-pay

## 2022-05-11 ENCOUNTER — Other Ambulatory Visit: Payer: Self-pay | Admitting: Internal Medicine

## 2022-05-11 DIAGNOSIS — E1165 Type 2 diabetes mellitus with hyperglycemia: Secondary | ICD-10-CM

## 2022-05-11 DIAGNOSIS — E109 Type 1 diabetes mellitus without complications: Secondary | ICD-10-CM

## 2022-05-11 MED FILL — Insulin Glargine-yfgn Soln Pen-Injector 100 Unit/ML: SUBCUTANEOUS | 28 days supply | Qty: 30 | Fill #1 | Status: AC

## 2022-05-12 ENCOUNTER — Other Ambulatory Visit: Payer: Self-pay

## 2022-05-12 ENCOUNTER — Other Ambulatory Visit: Payer: Self-pay | Admitting: Internal Medicine

## 2022-05-12 ENCOUNTER — Other Ambulatory Visit
Admission: RE | Admit: 2022-05-12 | Discharge: 2022-05-12 | Disposition: A | Discharge status: Home / Self Care | Payer: 59 | Attending: Internal Medicine | Admitting: Internal Medicine

## 2022-05-12 ENCOUNTER — Ambulatory Visit: Payer: 59 | Admitting: Family Medicine

## 2022-05-12 ENCOUNTER — Encounter: Payer: Self-pay | Admitting: Family Medicine

## 2022-05-12 ENCOUNTER — Ambulatory Visit (INDEPENDENT_AMBULATORY_CARE_PROVIDER_SITE_OTHER): Payer: 59 | Admitting: Dermatology

## 2022-05-12 VITALS — BP 124/70 | HR 71 | Ht 65.0 in | Wt 305.0 lb

## 2022-05-12 DIAGNOSIS — X58XXXA Exposure to other specified factors, initial encounter: Secondary | ICD-10-CM | POA: Insufficient documentation

## 2022-05-12 DIAGNOSIS — M778 Other enthesopathies, not elsewhere classified: Secondary | ICD-10-CM

## 2022-05-12 DIAGNOSIS — Z794 Long term (current) use of insulin: Secondary | ICD-10-CM

## 2022-05-12 DIAGNOSIS — M65331 Trigger finger, right middle finger: Secondary | ICD-10-CM

## 2022-05-12 DIAGNOSIS — L859 Epidermal thickening, unspecified: Secondary | ICD-10-CM

## 2022-05-12 DIAGNOSIS — S90821A Blister (nonthermal), right foot, initial encounter: Secondary | ICD-10-CM | POA: Diagnosis not present

## 2022-05-12 DIAGNOSIS — S90822A Blister (nonthermal), left foot, initial encounter: Secondary | ICD-10-CM | POA: Diagnosis not present

## 2022-05-12 DIAGNOSIS — R600 Localized edema: Secondary | ICD-10-CM | POA: Diagnosis not present

## 2022-05-12 DIAGNOSIS — E1169 Type 2 diabetes mellitus with other specified complication: Secondary | ICD-10-CM | POA: Insufficient documentation

## 2022-05-12 DIAGNOSIS — L97519 Non-pressure chronic ulcer of other part of right foot with unspecified severity: Secondary | ICD-10-CM | POA: Diagnosis not present

## 2022-05-12 DIAGNOSIS — E109 Type 1 diabetes mellitus without complications: Secondary | ICD-10-CM

## 2022-05-12 DIAGNOSIS — L97529 Non-pressure chronic ulcer of other part of left foot with unspecified severity: Secondary | ICD-10-CM | POA: Diagnosis not present

## 2022-05-12 DIAGNOSIS — E1165 Type 2 diabetes mellitus with hyperglycemia: Secondary | ICD-10-CM

## 2022-05-12 DIAGNOSIS — T148XXA Other injury of unspecified body region, initial encounter: Secondary | ICD-10-CM | POA: Diagnosis not present

## 2022-05-12 LAB — HEMOGLOBIN A1C
Hgb A1c MFr Bld: 5.6 % (ref 4.8–5.6)
Mean Plasma Glucose: 114.02 mg/dL

## 2022-05-12 MED ORDER — DICLOFENAC SODIUM 2 % EX SOLN
2.0000 | Freq: Two times a day (BID) | CUTANEOUS | 1 refills | Status: DC | PRN
  Filled 2022-05-12: qty 112, 20d supply, fill #0

## 2022-05-12 MED ORDER — TRIAMCINOLONE ACETONIDE 0.1 % EX CREA
1.0000 | TOPICAL_CREAM | Freq: Every day | CUTANEOUS | 0 refills | Status: AC | PRN
  Filled 2022-05-12: qty 80, 30d supply, fill #0

## 2022-05-12 MED ORDER — DICLOFENAC SODIUM 1 % EX GEL
2.0000 g | Freq: Four times a day (QID) | CUTANEOUS | 1 refills | Status: AC
  Filled 2022-05-12: qty 100, 12d supply, fill #0
  Filled 2022-05-26: qty 100, 10d supply, fill #0
  Filled 2022-12-02: qty 100, 10d supply, fill #1

## 2022-05-12 NOTE — Progress Notes (Signed)
   New Patient Visit  Subjective  Maria Singh is a 71 y.o. female who presents for the following: blisters (Feet, L > R, ~6wks, painful, Bactroban seemed irritating, A&D ointment, has taken keflex x 2 doses, and 1 round of amoxicillin, and took 2 days or oral zovirax  ). Pt is wheelchair bound and is unable to walk.  She can elevate feet but does not normally do that.  New patient referral from Dr. Halina Maidens. Patient accompanied by a friend who contributes to history.  The following portions of the chart were reviewed this encounter and updated as appropriate:   Tobacco  Allergies  Meds  Problems  Med Hx  Surg Hx  Fam Hx     Review of Systems:  No other skin or systemic complaints except as noted in HPI or Assessment and Plan.  Objective  Well appearing patient in no apparent distress; mood and affect are within normal limits.  A focused examination was performed including bil feet. Relevant physical exam findings are noted in the Assessment and Plan.  bil feet Pitting edema lower legs, much more significant edema on feet, erosions bil foot dorsum           bil feet Hyperkeratosis bil feet   Assessment & Plan  Blistering bil feet Blistering and Erosions/ulcers of feet with weeping 2ndary to severe leg and foot edema Bacterial culture today Start TMC 0.1% cr qd 5d/wk to aa feet, avoid f/g/a Elevate legs constantly as best as can (pt cannot put on compression stockings - an currently skin too fragile to use compression stockings) Pt will call in 2 weeks and let us know if still has a lot of swelling, if so may have her come in for [employer]  May consider unna boot wraps; May consider lymphedema clinic evaluation  Topical steroids (such as triamcinolone, fluocinolone, fluocinonide, mometasone, clobetasol, halobetasol, betamethasone, hydrocortisone) can cause thinning and lightening of the skin if they are used for too long in the same area. Your  physician has selected the right strength medicine for your problem and area affected on the body. Please use your medication only as directed by your physician to prevent side effects.    triamcinolone cream (KENALOG) 0.1 % - bil feet Apply 1 Application topically daily as needed. Qd up to 5 days a week top of feet until clear, avoid face, groin, axilla  Related Procedures Aerobic culture  Hyperkeratosis bil feet 2ndary to Edema Recommend keeping legs elevated  Return in about 1 month (around 06/12/2022) for Blistering and weeping of feet.  I, Othelia Pulling, RMA, am acting as scribe for Sarina Ser, MD . Documentation: I have reviewed the above documentation for accuracy and completeness, and I agree with the above.  Sarina Ser, MD

## 2022-05-12 NOTE — Assessment & Plan Note (Signed)
Several month history of right third digit trigger finger, has slowly been improving though persistent swelling noted and does continue to trigger.  She did dose ibuprofen prior to coming to this visit and feels the symptoms are relatively controlled with it other than swelling.  Examination reveals no overt triggering, there is swelling to inspection, palpable nodule at the flexor tendon consistent with trigger finger.  She will trial topical anti-inflammatory, recalcitrant symptoms can be addressed with ultrasound-guided corticosteroid injection.

## 2022-05-12 NOTE — Assessment & Plan Note (Addendum)
Right-hand-dominant patient presenting with right hand/wrist pain, ulnarly localized, no radiation to the forearm, somewhat into the hand, no paresthesias.  She utilizes a power wheelchair at baseline but denies any change in activity or trauma.  Has dosed ibuprofen sporadically with some response.  Examination shows full painful range of motion primarily with resisted ulnar deviation and extension, tenderness along the ECU tendon without subluxation noted.  Plan for cock-up wrist brace during periods of activity, topical NSAID, and home exercises after 1 week.  For recalcitrant symptoms ultrasound-guided corticosteroid injection to be considered.

## 2022-05-12 NOTE — Patient Instructions (Addendum)
Start Triamcinolone cream once daily 5 days a week to tops of feet, avoid using on face, underarms and in groin.  Try and keep legs elevated.  Patient to call [phone] in 2 weeks and let us know if swelling in feet has improved, worsened or stayed the same.     Due to recent changes in healthcare laws, you may see results of your pathology and/or laboratory studies on MyChart before the doctors have had a chance to review them. We understand that in some cases there may be results that are confusing or concerning to you. Please understand that not all results are received at the same time and often the doctors may need to interpret multiple results in order to provide you with the best plan of care or course of treatment. Therefore, we ask that you please give [place] 2 business days to thoroughly review all your results before contacting the office for clarification. Should we see a critical lab result, you will be contacted sooner.   If You Need Anything After Your Visit  If you have any questions or concerns for your doctor, please call our main line at [phone] and press option 4 to reach your doctor's medical assistant. If no one answers, please leave a voicemail as directed and we will return your call as soon as possible. Messages left after 4 pm will be answered the following business day.   You may also send [place] a message via [place]. We typically respond to MyChart messages within 1-2 business days.  For prescription refills, please ask your pharmacy to contact our office. Our fax number is [phone].  If you have an urgent issue when the clinic is closed that cannot wait until the next business day, you can page your doctor at the number below.    Please note that while we do our best to be available for urgent issues outside of office hours, we are not available 24/7.   If you have an urgent issue and are unable to reach [place], you may choose to seek medical care at your  doctor's office, retail clinic, urgent care center, or emergency room.  If you have a medical emergency, please immediately call 911 or go to the emergency department.  Pager Numbers  - Dr. Nehemiah Massed: [phone]  - Dr. Laurence Ferrari: [phone]  - Dr. Nicole Kindred: [phone]  In the event of inclement weather, please call our main line at [phone] for an update on the status of any delays or closures.  Dermatology Medication Tips: Please keep the boxes that topical medications come in in order to help keep track of the instructions about where and how to use these. Pharmacies typically print the medication instructions only on the boxes and not directly on the medication tubes.   If your medication is too expensive, please contact our office at [phone] option 4 or send [place] a message through [place].   We are unable to tell what your co-pay for medications will be in advance as this is different depending on your insurance coverage. However, we may be able to find a substitute medication at lower cost or fill out paperwork to get insurance to cover a needed medication.   If a prior authorization is required to get your medication covered by your insurance company, please allow [place] 1-2 business days to complete this process.  Drug prices often vary depending on where the prescription is filled and some pharmacies may offer cheaper prices.  The website www.goodrx.com contains  coupons for medications through different pharmacies. The prices here do not account for what the cost may be with help from insurance (it may be cheaper with your insurance), but the website can give you the price if you did not use any insurance.  - You can print the associated coupon and take it with your prescription to the pharmacy.  - You may also stop by our office during regular business hours and pick up a GoodRx coupon card.  - If you need your prescription sent electronically to a different pharmacy, notify  our office through [facility] or by phone at [phone] option 4.     Si Usted Necesita Algo Despus de Su Visita  Tambin puede enviarnos un mensaje a travs de Pharmacist, community. Por lo general respondemos a los mensajes de MyChart en el transcurso de 1 a 2 das hbiles.  Para renovar recetas, por favor pida a su farmacia que se ponga en contacto con nuestra oficina. Harland Dingwall de fax es [place] [phone].  Si tiene un asunto urgente cuando la clnica est cerrada y que no puede esperar hasta el siguiente da hbil, puede llamar/localizar a su doctor(a) al nmero que aparece a continuacin.   Por favor, tenga en cuenta que aunque hacemos todo lo posible para estar disponibles para asuntos urgentes fuera del horario de [place], no estamos disponibles las 24 horas del da, los 7 das de la [place].   Si tiene un problema urgente y no puede comunicarse con nosotros, puede optar por buscar atencin mdica  en el consultorio de su doctor(a), en una clnica privada, en un centro de atencin urgente o en una sala de emergencias.  Si tiene Engineering geologist, por favor llame inmediatamente al 911 o vaya a la sala de emergencias.  Nmeros de bper  - Dr. Nehemiah Massed: [phone]  - Dra. Moye: [phone]  - Dra. Nicole Kindred: [phone]  En caso de inclemencias del [place], por favor llame a Johnsie Kindred principal al [phone] para una actualizacin sobre el [place] de cualquier retraso o cierre.  Consejos para la medicacin en dermatologa: Por favor, guarde las cajas en las que vienen los medicamentos de uso tpico para ayudarle a seguir las instrucciones sobre dnde y cmo usarlos. Las farmacias generalmente imprimen las instrucciones del medicamento slo en las cajas y no directamente en los tubos del [place].   Si su medicamento es muy caro, por favor, pngase en contacto con Zigmund Daniel llamando al [phone] y presione la opcin 4 o envenos un mensaje a travs de  Pharmacist, community.   No podemos decirle cul ser su copago por los medicamentos por adelantado ya que esto es diferente dependiendo de la cobertura de su seguro. Sin embargo, es posible que podamos encontrar un medicamento sustituto a Electrical engineer un formulario para que el seguro cubra el medicamento que se considera necesario.   Si se requiere una autorizacin previa para que su compaa de seguros [place] su medicamento, por favor permtanos de 1 a 2 das hbiles para completar este proceso.  Los precios de los medicamentos varan con frecuencia dependiendo del Environmental consultant de dnde se surte la receta y alguna farmacias pueden ofrecer precios ms baratos.  El sitio web www.goodrx.com tiene cupones para medicamentos de Airline pilot. Los precios aqu no tienen en cuenta lo que podra costar con la ayuda del seguro (puede ser ms barato con su seguro), pero el sitio web puede darle el precio si no utiliz Research scientist (physical sciences).  - Puede imprimir el cupn  correspondiente y llevarlo con su receta a la farmacia.  - Tambin puede pasar por nuestra oficina durante el horario de atencin regular y Charity fundraiser una tarjeta de cupones de GoodRx.  - Si necesita que su receta se enve electrnicamente a una farmacia diferente, informe a nuestra oficina a travs de MyChart de [place] o por telfono llamando al [phone] y presione la opcin 4.

## 2022-05-12 NOTE — Patient Instructions (Signed)
-   Use wrist brace throughout the day, okay to remove for bathing, sleeping, eating, hand hygiene - Apply diclofenac (either Pennsaid 2% or Voltaren 1%) twice daily - Start home exercises after 1 week, focus on slow and steady advance - Contact our office at the 2-4-week mark or beyond for any lack of adequate progress, we will schedule follow-up - Otherwise, follow-up as needed

## 2022-05-12 NOTE — Progress Notes (Signed)
     Primary Care / Sports Medicine Office Visit  Patient Information:  Patient ID: Maria Singh, female DOB: 12-11-1950 Age: 71 y.o. MRN: [id number]   Maria Singh is a pleasant 71 y.o. female presenting with the following:  Chief Complaint  Patient presents with   Wrist Pain    Right, had trigger finger in dec. Got better,swollen middle finger    Vitals:   05/12/22 1127  BP: 124/70  Pulse: 71  SpO2: 96%   Vitals:   05/12/22 1127  Weight: (!) 305 lb (138.3 kg)  Height: '5\' 5"'$  (1.651 m)   Body mass index is 50.75 kg/m.  No results found.   Independent interpretation of notes and tests performed by another provider:   None  Procedures performed:   None  Pertinent History, Exam, Impression, and Recommendations:   Problem List Items Addressed This Visit       Musculoskeletal and Integument   Extensor carpi ulnaris tendinitis - Primary    Right-hand-dominant patient presenting with right hand/wrist pain, ulnarly localized, no radiation to the forearm, somewhat into the hand, no paresthesias.  She utilizes a power wheelchair at baseline but denies any change in activity or trauma.  Has dosed ibuprofen sporadically with some response.  Examination shows full painful range of motion primarily with resisted ulnar deviation and extension, tenderness along the ECU tendon without subluxation noted.  Plan for cock-up wrist brace during periods of activity, topical NSAID, and home exercises after 1 week.  For recalcitrant symptoms ultrasound-guided corticosteroid injection to be considered.      Relevant Medications   diclofenac Sodium (VOLTAREN) 1 % GEL   Trigger middle finger of right hand    Several month history of right third digit trigger finger, has slowly been improving though persistent swelling noted and does continue to trigger.  She did dose ibuprofen prior to coming to this visit and feels the symptoms are relatively controlled with it other than  swelling.  Examination reveals no overt triggering, there is swelling to inspection, palpable nodule at the flexor tendon consistent with trigger finger.  She will trial topical anti-inflammatory, recalcitrant symptoms can be addressed with ultrasound-guided corticosteroid injection.      Relevant Medications   diclofenac Sodium (VOLTAREN) 1 % GEL   Other Visit Diagnoses     Type 2 diabetes mellitus with other specified complication, with long-term current use of insulin ([place])       Relevant Orders   Hemoglobin A1c        Orders & Medications Meds ordered this encounter  Medications   DISCONTD: diclofenac Sodium (PENNSAID) 2 % SOLN    Sig: Place 2 Pump (40 mg total) onto the skin 2 (two) times daily as needed.    Dispense:  112 g    Refill:  1    Use Transport planner for discount. Dx. Osteoarthritis   diclofenac Sodium (VOLTAREN) 1 % GEL    Sig: Apply 2 g topically 4 (four) times daily. To affected joint.    Dispense:  100 g    Refill:  1   Orders Placed This Encounter  Procedures   Hemoglobin A1c     No follow-ups on file.     Montel Culver, MD   Primary Care Sports Medicine [place]

## 2022-05-13 ENCOUNTER — Other Ambulatory Visit: Payer: Self-pay

## 2022-05-13 MED FILL — Metformin HCl Tab 1000 MG: ORAL | 90 days supply | Qty: 180 | Fill #0 | Status: AC

## 2022-05-13 NOTE — Telephone Encounter (Signed)
Requested Prescriptions  Pending Prescriptions Disp Refills  . metFORMIN (GLUCOPHAGE) 1000 MG tablet 180 tablet 0    Sig: TAKE 1 TABLET BY MOUTH 2 TIMES DAILY     Endocrinology:  Diabetes - Biguanides Failed - 05/12/2022  9:57 AM      Failed - B12 Level in normal range and within 720 days    No results found for: "VITAMINB12"       Failed - CBC within normal limits and completed in the last 12 months    WBC  Date Value Ref Range Status  01/15/2022 6.5  Final  12/08/2019 7.3 4.0 - 10.5 K/uL Final   RBC  Date Value Ref Range Status  12/08/2019 3.94 3.87 - 5.11 MIL/uL Final   Hemoglobin  Date Value Ref Range Status  01/15/2022 11.3 (A) 12.0 - 16.0 Final  05/17/2019 11.3 11.1 - 15.9 g/dL Final   HCT  Date Value Ref Range Status  01/15/2022 34 (A) 36 - 46 Final   Hematocrit  Date Value Ref Range Status  05/17/2019 35.0 34.0 - 46.6 % Final   MCHC  Date Value Ref Range Status  12/08/2019 31.6 30.0 - 36.0 g/dL Final   [facility]  Date Value Ref Range Status  12/08/2019 28.2 26.0 - 34.0 pg Final   MCV  Date Value Ref Range Status  12/08/2019 89.1 80.0 - 100.0 fL Final  05/17/2019 86 79 - 97 fL Final  08/09/2013 84 80 - 100 fL Final   No results found for: "PLTCOUNTKUC", "LABPLAT", "POCPLA" RDW  Date Value Ref Range Status  12/08/2019 14.9 11.5 - 15.5 % Final  05/17/2019 15.1 11.7 - 15.4 % Final  08/09/2013 15.3 (H) 11.5 - 14.5 % Final         Passed - Cr in normal range and within 360 days    Creatinine  Date Value Ref Range Status  08/11/2013 1.08 0.60 - 1.30 mg/dL Final   Creatinine, Ser  Date Value Ref Range Status  02/09/2022 0.77 0.44 - 1.00 mg/dL Final   Creatinine, Urine  Date Value Ref Range Status  12/07/2019 21 mg/dL Final    Comment:    Performed at [facility], [address] [place], [place] [address]         Passed - HBA1C is between 0 and 7.9 and within 180 days    Hemoglobin A1C  Date Value Ref Range Status  01/15/2022 6.1  Final    Hgb A1c MFr Bld  Date Value Ref Range Status  05/12/2022 5.6 4.8 - 5.6 % Final    Comment:    (NOTE) Pre diabetes:          5.7%-6.4%  Diabetes:              >6.4%  Glycemic control for   <7.0% adults with diabetes          Passed - eGFR in normal range and within 360 days    EGFR (African American)  Date Value Ref Range Status  08/11/2013 >60  Final   GFR calc Af Amer  Date Value Ref Range Status  10/01/2020 75 >59 mL/min/1.73 Final    Comment:    **In accordance with recommendations from the NKF-ASN Task force,**   [facility] is in the process of updating its eGFR calculation to the   2021 CKD-EPI creatinine equation that estimates kidney function   without a race variable.    EGFR (Non-African Amer.)  Date Value Ref Range Status  08/11/2013 55 (  L)  Final    Comment:    eGFR values <63m/min/1.73 m2 may be an indication of chronic kidney disease (CKD). Calculated eGFR is useful in patients with stable renal function. The eGFR calculation will not be reliable in acutely ill patients when serum creatinine is changing rapidly. It is not useful in  patients on dialysis. The eGFR calculation may not be applicable to patients at the low and high extremes of body sizes, pregnant women, and vegetarians.    GFR, Estimated  Date Value Ref Range Status  02/09/2022 >60 >60 mL/min Final    Comment:    (NOTE) Calculated using the CKD-EPI Creatinine Equation (2021)    eGFR  Date Value Ref Range Status  01/15/2022 75  Final         Passed - Valid encounter within last 6 months    Recent Outpatient Visits          Yesterday Ext[facility] carpi ulnaris tendinitis   [facility]   3 months ago Perineal ulcer, limited to breakdown of skin ([facility]   [facility]   1 year ago Type II diabetes mellitus with complication [facility]   1 year ago Type II diabetes mellitus with  complication ([facility]   2 years [facility]kal[facility]      Future Appointments            In 1 month KRalene Bathe MD [place]

## 2022-05-14 ENCOUNTER — Other Ambulatory Visit: Payer: Self-pay

## 2022-05-14 DIAGNOSIS — F4320 Adjustment disorder, unspecified: Secondary | ICD-10-CM | POA: Diagnosis not present

## 2022-05-17 ENCOUNTER — Encounter: Payer: Self-pay | Admitting: Dermatology

## 2022-05-18 ENCOUNTER — Telehealth: Payer: Self-pay

## 2022-05-18 LAB — AEROBIC CULTURE

## 2022-05-18 MED ORDER — CIPROFLOXACIN HCL 250 MG PO TABS: 250.0000 mg | ORAL_TABLET | Freq: Every day | ORAL | 0 refills | Status: DC

## 2022-05-18 NOTE — Telephone Encounter (Signed)
-----   Message from Ralene Bathe, MD sent at 05/18/2022 12:59 PM EDT ----- Culture from foot/leg from 05/12/2022 showed: Heavy growth Pseudomonas aeruginosa and other multiple gram negative rods.  Pt has allergy to Sulfa. Will start Cipro 250 mg 1 po qd x 7 days disp #7 0rf.  May also use white vinegar soaks daily for 15 minutes on feet.

## 2022-05-18 NOTE — Telephone Encounter (Signed)
Patient informed of pathology results and would like to know the instructions for vinegar soak.

## 2022-05-25 ENCOUNTER — Ambulatory Visit: Payer: 59 | Admitting: Family Medicine

## 2022-05-25 ENCOUNTER — Encounter: Payer: Self-pay | Admitting: Family Medicine

## 2022-05-25 VITALS — BP 148/62 | HR 69

## 2022-05-25 DIAGNOSIS — S91209A Unspecified open wound of unspecified toe(s) with damage to nail, initial encounter: Secondary | ICD-10-CM

## 2022-05-25 NOTE — Assessment & Plan Note (Signed)
Ms. Forrest presents for evaluation of acute, traumatic injury to left great toe that occurred roughly 1.5 hours prior to presentation. She struck a door frame / threshold when trying to step over it. Pain was present but limited, she has a history of DM2 and limited foot sensation.   Her exam reveals mildly swollen great toe without ecchymosis, there is a longitudinal split at the mid-portion of the great toenail and the medial aspect of the toenail is raised. Her sensation, while limited, is intact, there is brisk capillary refill, and no bony elements visualized under the nail.  I had a chance to reach out to the patient's podiatry group, [facility], and speak with Dr. Sharlotte Alamo, DPM regarding this mutual patient of their practice. Plan for removal of the medial aspect of the first digit toenail on the left foot, interim wound care, and close follow-up tomorrow with their group coordinated.  Patient tolerated procedure well, wound appropriately dressed, and follow-up with podiatry discussed.

## 2022-05-25 NOTE — Patient Instructions (Signed)
-   Keep toe clean and dry - Keep toe elevated to avoid excess swelling - Can take Tylenol for pain as-needed and use ice ant 20 minute intervals over a clean towel - Follow-up with Podiatry tomorrow as scheduled - Reach out for any questions

## 2022-05-25 NOTE — Progress Notes (Signed)
     Primary Care / Sports Medicine Office Visit  Patient Information:  Patient ID: Maria Singh, female DOB: 1950-12-22 Age: 71 y.o. MRN: [id number]   Maria Singh is a pleasant 71 y.o. female presenting with the following:  Chief Complaint  Patient presents with   Toe Injury    Great toe on left side caught on door frame coming out of room earlier today.     Vitals:   05/25/22 1610  BP: (!) 148/62  Pulse: 69  SpO2: 96%   There were no vitals filed for this visit. There is no height or weight on file to calculate BMI.  No results found.   Independent interpretation of notes and tests performed by another provider:   None  Procedures performed:   Procedure:  Removal of left great toe toenail. Risks, benefits, alternatives explained to patient. Consent obtained. Time out conducted. Noted no overlying induration or erythema at site of injection. Toe cleaned with betadine Toe prepped and draped in a sterile fashion. Nail elevator used to separate nail plate from nail bed. Hemostat then used to separate medial nail fragment from surrounding structures. Nail bed and matrix treated. Minimal bleeding was controlled with pressure. Antibiotic ointment applied. Toe dressed. Advised to return if increased redness, swelling, drainage, fevers, or chills.   Pertinent History, Exam, Impression, and Recommendations:   Problem List Items Addressed This Visit       Musculoskeletal and Integument   Toenail avulsion, initial encounter - Primary    Maria Singh presents for evaluation of acute, traumatic injury to left great toe that occurred roughly 1.5 hours prior to presentation. She struck a door frame / threshold when trying to step over it. Pain was present but limited, she has a history of DM2 and limited foot sensation.   Her exam reveals mildly swollen great toe without ecchymosis, there is a longitudinal split at the mid-portion of the great toenail and the medial  aspect of the toenail is raised. Her sensation, while limited, is intact, there is brisk capillary refill, and no bony elements visualized under the nail.  I had a chance to reach out to the patient's podiatry group, [facility], and speak with Dr. Sharlotte Alamo, DPM regarding this mutual patient of their practice. Plan for removal of the medial aspect of the first digit toenail on the left foot, interim wound care, and close follow-up tomorrow with their group coordinated.  Patient tolerated procedure well, wound appropriately dressed, and follow-up with podiatry discussed.        Orders & Medications No orders of the defined types were placed in this encounter.  No orders of the defined types were placed in this encounter.    No follow-ups on file.     Montel Culver, MD   Primary Care Sports Medicine [place]

## 2022-05-26 ENCOUNTER — Other Ambulatory Visit: Payer: Self-pay

## 2022-05-26 DIAGNOSIS — Z794 Long term (current) use of insulin: Secondary | ICD-10-CM | POA: Diagnosis not present

## 2022-05-26 DIAGNOSIS — E1140 Type 2 diabetes mellitus with diabetic neuropathy, unspecified: Secondary | ICD-10-CM | POA: Diagnosis not present

## 2022-05-26 DIAGNOSIS — S90122A Contusion of left lesser toe(s) without damage to nail, initial encounter: Secondary | ICD-10-CM | POA: Diagnosis not present

## 2022-05-26 DIAGNOSIS — S91209A Unspecified open wound of unspecified toe(s) with damage to nail, initial encounter: Secondary | ICD-10-CM | POA: Diagnosis not present

## 2022-05-26 DIAGNOSIS — L03032 Cellulitis of left toe: Secondary | ICD-10-CM | POA: Diagnosis not present

## 2022-05-26 DIAGNOSIS — S9032XA Contusion of left foot, initial encounter: Secondary | ICD-10-CM | POA: Diagnosis not present

## 2022-06-08 ENCOUNTER — Ambulatory Visit: Payer: Medicare Other | Admitting: Dermatology

## 2022-06-10 ENCOUNTER — Other Ambulatory Visit: Payer: Self-pay

## 2022-06-10 ENCOUNTER — Other Ambulatory Visit: Payer: Self-pay | Admitting: Internal Medicine

## 2022-06-10 DIAGNOSIS — E118 Type 2 diabetes mellitus with unspecified complications: Secondary | ICD-10-CM

## 2022-06-10 MED ORDER — INSULIN GLARGINE-YFGN 100 UNIT/ML ~~LOC~~ SOPN
PEN_INJECTOR | SUBCUTANEOUS | 1 refills | Status: DC
  Filled 2022-06-10: qty 30, 28d supply, fill #0
  Filled 2022-07-07: qty 30, 28d supply, fill #1

## 2022-06-10 MED ORDER — HYDROCHLOROTHIAZIDE 25 MG PO TABS
ORAL_TABLET | Freq: Every day | ORAL | 1 refills | Status: DC
  Filled 2022-06-10: qty 90, 90d supply, fill #0
  Filled 2022-09-23: qty 90, 90d supply, fill #1

## 2022-06-10 NOTE — Telephone Encounter (Signed)
Requested medication (s) are due for refill today: yes  Requested medication (s) are on the active medication list: yes  Last refill:  04/09/22 #30  Future visit scheduled: no  Notes to clinic:  med not assigned to protocol   Requested Prescriptions  Pending Prescriptions Disp Refills   insulin glargine-yfgn (SEMGLEE, YFGN,) 100 UNIT/ML Pen 30 mL     Sig: Inject 60 Units into the skin every morning AND 50 Units every evening.     Off-Protocol Failed - 06/10/2022  9:47 AM      Failed - Medication not assigned to a protocol, review manually.      Passed - Valid encounter within last 12 months    Recent Outpatient Visits           2 weeks ago Toenail avulsion, initial encounter   [place] [facility] at [place], Earley Abide, MD   4 weeks ago Extensor carpi ulnaris tendinitis   [place] [facility] at [place], Earley Abide, MD   4 months ago Essential hypertension   [place] [facility] at [facility], Jesse Sans, MD   1 year ago Type II diabetes mellitus with complication [facility])   [place] [facility], Jesse Sans, MD   1 year ago Type II diabetes mellitus with complication [facility])   [place] [facility] at [facility], Jesse Sans, MD       Future Appointments             In 1 week Ralene Bathe, MD [place]            Signed Prescriptions Disp Refills   hydrochlorothiazide (HYDRODIURIL) 25 MG tablet 90 tablet 1    Sig: TAKE 1 TABLET BY MOUTH DAILY.     Cardiovascular: Diuretics - Thiazide Failed - 06/10/2022  9:47 AM      Failed - Last BP in normal range    BP Readings from Last 1 Encounters:  05/25/22 (!) 148/62         Passed - Cr in normal range and within 180 days    Creatinine  Date Value Ref Range Status  08/11/2013 1.08 0.60 - 1.30 mg/dL Final    Creatinine, Ser  Date Value Ref Range Status  02/09/2022 0.77 0.44 - 1.00 mg/dL Final   Creatinine, Urine  Date Value Ref Range Status  12/07/2019 21 mg/dL Final    Comment:    Performed at [facility], [address] [place], [place] [address]         Passed - K in normal range and within 180 days    Potassium  Date Value Ref Range Status  02/09/2022 5.1 3.5 - 5.1 mmol/L Final  08/11/2013 5.7 (H) 3.5 - 5.1 mmol/L Final         Passed - Na in normal range and within 180 days    Sodium  Date Value Ref Range Status  02/09/2022 137 135 - 145 mmol/L Final  01/15/2022 144 137 - 147 Final  08/11/2013 144 136 - 145 mmol/L Final         Passed - Valid encounter within last 6 months    Recent Outpatient Visits           2 weeks ago Toenail avulsion, initial encounter   [place] [facility] at [facility]  [place] Montel Culver, MD   4 weeks ago Extensor carpi ulnaris tendinitis   [place] [facility] at [place], Earley Abide, MD   4 months ago Essential hypertension   [place] [facility] at [facility], Jesse Sans, MD   1 year ago Type II diabetes mellitus with complication [facility])   [place] [facility] at [facility], Jesse Sans, MD   1 year ago Type II diabetes mellitus with complication [facility])   [place] [facility] at [facility], Jesse Sans, MD       Future Appointments             In 1 week Ralene Bathe, MD [place]            Refused Prescriptions Disp Refills   patiromer (VELTASSA) 8.4 g packet 30 each     Sig: USE ONE POWDER PACKET DAILY AS DIRECTED     Off-Protocol Failed - 06/10/2022  9:47 AM      Failed - Medication not assigned to a protocol, review manually.      Passed - Valid encounter within last 12 months    Recent Outpatient Visits           2 weeks  ago Toenail avulsion, initial encounter   [place] [facility] at [place], Earley Abide, MD   4 weeks ago Extensor carpi ulnaris tendinitis   [place] [facility] at [place], Earley Abide, MD   4 months ago Essential hypertension   [place] [facility] at [facility], Jesse Sans, MD   1 year ago Type II diabetes mellitus with complication [facility])   [place] [facility] at [facility], Jesse Sans, MD   1 year ago Type II diabetes mellitus with complication [facility])   [place] [facility] at [facility], Jesse Sans, MD       Future Appointments             In 1 week Ralene Bathe, MD [place]

## 2022-06-10 NOTE — Telephone Encounter (Signed)
Requested Prescriptions  Pending Prescriptions Disp Refills  . hydrochlorothiazide (HYDRODIURIL) 25 MG tablet 90 tablet 1    Sig: TAKE 1 TABLET BY MOUTH DAILY.     Cardiovascular: Diuretics - Thiazide Failed - 06/10/2022  9:47 AM      Failed - Last BP in normal range    BP Readings from Last 1 Encounters:  05/25/22 (!) 148/62         Passed - Cr in normal range and within 180 days    Creatinine  Date Value Ref Range Status  08/11/2013 1.08 0.60 - 1.30 mg/dL Final   Creatinine, Ser  Date Value Ref Range Status  02/09/2022 0.77 0.44 - 1.00 mg/dL Final   Creatinine, Urine  Date Value Ref Range Status  12/07/2019 21 mg/dL Final    Comment:    Performed at [facility], [address] [place], [place] [address]         Passed - K in normal range and within 180 days    Potassium  Date Value Ref Range Status  02/09/2022 5.1 3.5 - 5.1 mmol/L Final  08/11/2013 5.7 (H) 3.5 - 5.1 mmol/L Final         Passed - Na in normal range and within 180 days    Sodium  Date Value Ref Range Status  02/09/2022 137 135 - 145 mmol/L Final  01/15/2022 144 137 - 147 Final  08/11/2013 144 136 - 145 mmol/L Final         Passed - Valid encounter within last 6 months    Recent Outpatient Visits          2 weeks ago Toenail avulsion, initial encounter   [place] [facility] at [place], Earley Abide, MD   4 weeks ago Extensor carpi ulnaris tendinitis   [place] [facility] at [place], Earley Abide, MD   4 months ago Essential hypertension   [place] [facility] at [facility], Jesse Sans, MD   1 year ago Type II diabetes mellitus with complication [facility])   [place] [facility] at [facility], Jesse Sans, MD   1 year ago Type II diabetes mellitus with complication [facility])   [place] [facility] at [facility],  Jesse Sans, MD      Future Appointments            In 1 week Ralene Bathe, MD [place]           . insulin glargine-yfgn (SEMGLEE, YFGN,) 100 UNIT/ML Pen 30 mL     Sig: Inject 60 Units into the skin every morning AND 50 Units every evening.     Off-Protocol Failed - 06/10/2022  9:47 AM      Failed - Medication not assigned to a protocol, review manually.      Passed - Valid encounter within last 12 months    Recent Outpatient Visits          2 weeks ago Toenail avulsion, initial encounter   [place] [facility] at [facility], Earley Abide, MD   4 weeks ago Extensor carpi ulnaris tendinitis   [place] [facility] at [facility], Earley Abide, MD   4 months ago Essential hypertension   [facility] at [facility], Jesse Sans, MD  1 year ago Type II diabetes mellitus with complication [facility])   [place] [facility] at [facility], Jesse Sans, MD   1 year ago Type II diabetes mellitus with complication [facility])   [place] [facility] at [facility], Jesse Sans, MD      Future Appointments            In 1 week Ralene Bathe, MD [place]           . patiromer (VELTASSA) 8.4 g packet 30 each     Sig: USE ONE POWDER PACKET DAILY AS DIRECTED     Off-Protocol Failed - 06/10/2022  9:47 AM      Failed - Medication not assigned to a protocol, review manually.      Passed - Valid encounter within last 12 months    Recent Outpatient Visits          2 weeks ago Toenail avulsion, initial encounter   [place] [facility] at [place], Earley Abide, MD   4 weeks ago Extensor carpi ulnaris tendinitis   [place] [facility] at [place], Earley Abide, MD   4 months ago Essential hypertension   [place] [facility] at [facility], Jesse Sans, MD   1 year ago Type II diabetes mellitus with complication [facility])   [place] [facility] at [facility], Jesse Sans, MD   1 year ago Type II diabetes mellitus with complication [facility])   [place] [facility] at [facility], Jesse Sans, MD      Future Appointments            In 1 week Ralene Bathe, MD [place]

## 2022-06-10 NOTE — Telephone Encounter (Signed)
Dr Holley Raring from nephrology ordered med Last RF 08/05/20 30 each 11 RF expired 08/05/21   Requested Prescriptions  Pending Prescriptions Disp Refills  . insulin glargine-yfgn (SEMGLEE, YFGN,) 100 UNIT/ML Pen 30 mL     Sig: Inject 60 Units into the skin every morning AND 50 Units every evening.     Off-Protocol Failed - 06/10/2022  9:47 AM      Failed - Medication not assigned to a protocol, review manually.      Passed - Valid encounter within last 12 months    Recent Outpatient Visits          2 weeks ago Toenail avulsion, initial encounter   [place] [facility] at [place], Earley Abide, MD   4 weeks ago Extensor carpi ulnaris tendinitis   [place] [facility] at [place], Earley Abide, MD   4 months ago Essential hypertension   [place] [facility] at [facility], Jesse Sans, MD   1 year ago Type II diabetes mellitus with complication [facility])   [place] [facility] at [facility], Jesse Sans, MD   1 year ago Type II diabetes mellitus with complication [facility])   [place] [facility] at [facility], Jesse Sans, MD      Future Appointments            In 1 week Ralene Bathe, MD [place]           Signed Prescriptions Disp Refills   hydrochlorothiazide (HYDRODIURIL) 25 MG tablet 90 tablet 1    Sig: TAKE 1 TABLET BY MOUTH DAILY.     Cardiovascular: Diuretics - Thiazide Failed - 06/10/2022  9:47 AM      Failed - Last BP in normal range    BP Readings from Last 1 Encounters:  05/25/22 (!) 148/62         Passed - Cr in normal range and within 180 days    Creatinine  Date Value Ref Range Status  08/11/2013 1.08 0.60 - 1.30 mg/dL Final   Creatinine, Ser  Date Value Ref Range Status  02/09/2022 0.77 0.44 - 1.00 mg/dL Final   Creatinine, Urine  Date Value Ref Range Status  12/07/2019  21 mg/dL Final    Comment:    Performed at [facility] [place], [place] [address]         Passed - K in normal range and within 180 days    Potassium  Date Value Ref Range Status  02/09/2022 5.1 3.5 - 5.1 mmol/L Final  08/11/2013 5.7 (H) 3.5 - 5.1 mmol/L Final         Passed - Na in normal range and within 180 days    Sodium  Date Value Ref Range Status  02/09/2022 137 135 - 145 mmol/L Final  01/15/2022 144 137 - 147 Final  08/11/2013 144 136 - 145 mmol/L Final         Passed - Valid encounter within last 6 months    Recent Outpatient Visits          2 weeks ago Toenail avulsion, initial encounter   [place] [facility] at [facility], Earley Abide, MD   4 weeks ago Extensor carpi ulnaris tendinitis   [place] and Sports Medicine at [facility], Earley Abide, MD  4 months ago Essential hypertension   [place] [facility] at [facility], Jesse Sans, MD   1 year ago Type II diabetes mellitus with complication [facility])   [place] [facility] at [facility], Jesse Sans, MD   1 year ago Type II diabetes mellitus with complication [facility])   [place] [facility] at [facility], Jesse Sans, MD      Future Appointments            In 1 week Ralene Bathe, MD [place]  . patiromer (VELTASSA) 8.4 g packet 30 each     Sig: USE ONE POWDER PACKET DAILY AS DIRECTED     Off-Protocol Failed - 06/10/2022  9:47 AM      Failed - Medication not assigned to a protocol, review manually.      Passed - Valid encounter within last 12 months    Recent Outpatient Visits          2 weeks ago Toenail avulsion, initial encounter   [place] [facility] at [place], Earley Abide, MD   4 weeks ago Extensor carpi  ulnaris tendinitis   [place] [facility] at [place], Earley Abide, MD   4 months ago Essential hypertension   [place] [facility] at [facility], Jesse Sans, MD   1 year ago Type II diabetes mellitus with complication [facility])   [place] [facility] at [facility], Jesse Sans, MD   1 year ago Type II diabetes mellitus with complication [facility])    Primary Care and Sports Medicine at [facility], Jesse Sans, MD      Future Appointments            In 1 week Ralene Bathe, MD [place]

## 2022-06-11 ENCOUNTER — Other Ambulatory Visit: Payer: Self-pay

## 2022-06-16 ENCOUNTER — Other Ambulatory Visit: Payer: Self-pay

## 2022-06-17 ENCOUNTER — Ambulatory Visit: Payer: Medicare Other | Admitting: Dermatology

## 2022-06-17 ENCOUNTER — Other Ambulatory Visit: Payer: Self-pay

## 2022-06-23 ENCOUNTER — Other Ambulatory Visit: Payer: Self-pay

## 2022-06-23 MED FILL — Rosuvastatin Calcium Tab 5 MG: ORAL | 90 days supply | Qty: 90 | Fill #3 | Status: AC

## 2022-07-01 ENCOUNTER — Ambulatory Visit (INDEPENDENT_AMBULATORY_CARE_PROVIDER_SITE_OTHER): Payer: 59 | Admitting: Dermatology

## 2022-07-01 ENCOUNTER — Other Ambulatory Visit: Payer: Self-pay

## 2022-07-01 DIAGNOSIS — T148XXA Other injury of unspecified body region, initial encounter: Secondary | ICD-10-CM

## 2022-07-01 DIAGNOSIS — S90822A Blister (nonthermal), left foot, initial encounter: Secondary | ICD-10-CM | POA: Diagnosis not present

## 2022-07-01 DIAGNOSIS — D485 Neoplasm of uncertain behavior of skin: Secondary | ICD-10-CM

## 2022-07-01 DIAGNOSIS — D492 Neoplasm of unspecified behavior of bone, soft tissue, and skin: Secondary | ICD-10-CM

## 2022-07-01 DIAGNOSIS — E785 Hyperlipidemia, unspecified: Secondary | ICD-10-CM

## 2022-07-01 DIAGNOSIS — R600 Localized edema: Secondary | ICD-10-CM | POA: Diagnosis not present

## 2022-07-01 DIAGNOSIS — R262 Difficulty in walking, not elsewhere classified: Secondary | ICD-10-CM | POA: Diagnosis not present

## 2022-07-01 DIAGNOSIS — I10 Essential (primary) hypertension: Secondary | ICD-10-CM | POA: Diagnosis not present

## 2022-07-01 DIAGNOSIS — E1169 Type 2 diabetes mellitus with other specified complication: Secondary | ICD-10-CM

## 2022-07-01 DIAGNOSIS — L986 Other infiltrative disorders of the skin and subcutaneous tissue: Secondary | ICD-10-CM | POA: Diagnosis not present

## 2022-07-01 MED ORDER — MUPIROCIN 2 % EX OINT
1.0000 | TOPICAL_OINTMENT | Freq: Every day | CUTANEOUS | 3 refills | Status: DC
  Filled 2022-07-01: qty 22, 30d supply, fill #0

## 2022-07-01 NOTE — Patient Instructions (Addendum)
Biopsy to rule out Bullous Pemphigoid   Wound Care Instructions  Cleanse wound gently with soap and water once a day then pat dry with clean gauze. Apply a thin coat of Petrolatum (petroleum jelly, "Vaseline") over the wound (unless you have an allergy to this). We recommend that you use a new, sterile tube of Vaseline. Do not pick or remove scabs. Do not remove the yellow or white "healing tissue" from the base of the wound.  Cover the wound with fresh, clean, nonstick gauze and secure with paper tape. You may use Band-Aids in place of gauze and tape if the wound is small enough, but would recommend trimming much of the tape off as there is often too much. Sometimes Band-Aids can irritate the skin.  You should call the office for your biopsy report after 1 week if you have not already been contacted.  If you experience any problems, such as abnormal amounts of bleeding, swelling, significant bruising, significant pain, or evidence of infection, please call the office immediately.  FOR ADULT SURGERY PATIENTS: If you need something for pain relief you may take 1 extra strength Tylenol (acetaminophen) AND 2 Ibuprofen ('200mg'$  each) together every 4 hours as needed for pain. (do not take these if you are allergic to them or if you have a reason you should not take them.) Typically, you may only need pain medication for 1 to 3 days.        Due to recent changes in healthcare laws, you may see results of your pathology and/or laboratory studies on MyChart before the doctors have had a chance to review them. We understand that in some cases there may be results that are confusing or concerning to you. Please understand that not all results are received at the same time and often the doctors may need to interpret multiple results in order to provide you with the best plan of care or course of treatment. Therefore, we ask that you please give [place] 2 business days to thoroughly review all your results before  contacting the office for clarification. Should we see a critical lab result, you will be contacted sooner.   If You Need Anything After Your Visit  If you have any questions or concerns for your doctor, please call our main line at [phone] and press option 4 to reach your doctor's medical assistant. If no one answers, please leave a voicemail as directed and we will return your call as soon as possible. Messages left after 4 pm will be answered the following business day.   You may also send [place] a message via [place]. We typically respond to MyChart messages within 1-2 business days.  For prescription refills, please ask your pharmacy to contact our office. Our fax number is [phone].  If you have an urgent issue when the clinic is closed that cannot wait until the next business day, you can page your doctor at the number below.    Please note that while we do our best to be available for urgent issues outside of office hours, we are not available 24/7.   If you have an urgent issue and are unable to reach [place], you may choose to seek medical care at your doctor's office, retail clinic, urgent care center, or emergency room.  If you have a medical emergency, please immediately call 911 or go to the emergency department.  Pager Numbers  - Dr. Nehemiah Massed: [phone]  - Dr. Laurence Ferrari: [phone]  - Dr. Nicole Kindred: [phone]  In  the event of inclement weather, please call our main line at [phone] for an update on the status of any delays or closures.  Dermatology Medication Tips: Please keep the boxes that topical medications come in in order to help keep track of the instructions about where and how to use these. Pharmacies typically print the medication instructions only on the boxes and not directly on the medication tubes.   If your medication is too expensive, please contact our office at [phone] option 4 or send [place] a message through [place].   We are unable to tell  what your co-pay for medications will be in advance as this is different depending on your insurance coverage. However, we may be able to find a substitute medication at lower cost or fill out paperwork to get insurance to cover a needed medication.   If a prior authorization is required to get your medication covered by your insurance company, please allow [place] 1-2 business days to complete this process.  Drug prices often vary depending on where the prescription is filled and some pharmacies may offer cheaper prices.  The website www.goodrx.com contains coupons for medications through different pharmacies. The prices here do not account for what the cost may be with help from insurance (it may be cheaper with your insurance), but the website can give you the price if you did not use any insurance.  - You can print the associated coupon and take it with your prescription to the pharmacy.  - You may also stop by our office during regular business hours and pick up a GoodRx coupon card.  - If you need your prescription sent electronically to a different pharmacy, notify our office through [facility] or by phone at [phone] option 4.     Si Usted Necesita Algo Despus de Su Visita  Tambin puede enviarnos un mensaje a travs de Pharmacist, community. Por lo general respondemos a los mensajes de MyChart en el transcurso de 1 a 2 das hbiles.  Para renovar recetas, por favor pida a su farmacia que se ponga en contacto con nuestra oficina. Harland Dingwall de fax es [place] [phone].  Si tiene un asunto urgente cuando la clnica est cerrada y que no puede esperar hasta el siguiente da hbil, puede llamar/localizar a su doctor(a) al nmero que aparece a continuacin.   Por favor, tenga en cuenta que aunque hacemos todo lo posible para estar disponibles para asuntos urgentes fuera del horario de [place], no estamos disponibles las 24 horas del da, los 7 das de la [place].   Si tiene un problema  urgente y no puede comunicarse con nosotros, puede optar por buscar atencin mdica  en el consultorio de su doctor(a), en una clnica privada, en un centro de atencin urgente o en una sala de emergencias.  Si tiene Engineering geologist, por favor llame inmediatamente al 911 o vaya a la sala de emergencias.  Nmeros de bper  - Dr. Nehemiah Massed: [phone]  - Dra. Moye: [phone]  - Dra. Nicole Kindred: [phone]  En caso de inclemencias del [place], por favor llame a [place] Kindred principal al [phone] para una actualizacin sobre el [place] de cualquier retraso o cierre.  Consejos para la medicacin en dermatologa: Por favor, guarde las cajas en las que vienen los medicamentos de uso tpico para ayudarle a seguir las instrucciones sobre dnde y cmo usarlos. Las farmacias generalmente imprimen las instrucciones del medicamento slo en las cajas y no directamente en los tubos del [place].   Si  su medicamento es muy caro, por favor, pngase en contacto con Zigmund Daniel llamando al [phone] y presione la opcin 4 o envenos un mensaje a travs de Pharmacist, community.   No podemos decirle cul ser su copago por los medicamentos por adelantado ya que esto es diferente dependiendo de la cobertura de su seguro. Sin embargo, es posible que podamos encontrar un medicamento sustituto a Electrical engineer un formulario para que el seguro cubra el medicamento que se considera necesario.   Si se requiere una autorizacin previa para que su compaa de seguros [place] su medicamento, por favor permtanos de 1 a 2 das hbiles para completar este proceso.  Los precios de los medicamentos varan con frecuencia dependiendo del Environmental consultant de dnde se surte la receta y alguna farmacias pueden ofrecer precios ms baratos.  El sitio web www.goodrx.com tiene cupones para medicamentos de Airline pilot. Los precios aqu no tienen en cuenta lo que podra costar con la ayuda del seguro (puede ser ms barato con  su seguro), pero el sitio web puede darle el precio si no utiliz Research scientist (physical sciences).  - Puede imprimir el cupn correspondiente y llevarlo con su receta a la farmacia.  - Tambin puede pasar por nuestra oficina durante el horario de atencin regular y Charity fundraiser una tarjeta de cupones de GoodRx.  - Si necesita que su receta se enve electrnicamente a una farmacia diferente, informe a nuestra oficina a travs de MyChart de [place] o por telfono llamando al [phone] y presione la opcin 4.

## 2022-07-01 NOTE — Progress Notes (Signed)
Follow-Up Visit   Subjective  Maria Singh is a 71 y.o. female who presents for the following: F/U Blistering, Erosions/Ulcers of feet  (Bil feet, Culture showed Heavy growth Pseudomonas aeruginosa and gram negative rods, finished Cipro, uses TMC 0.1% cr prn, Vinegar soaks qd).  Patient accompanied by friend  The following portions of the chart were reviewed this encounter and updated as appropriate:   Tobacco  Allergies  Meds  Problems  Med Hx  Surg Hx  Fam Hx     Review of Systems:  No other skin or systemic complaints except as noted in HPI or Assessment and Plan.  Objective  Well appearing patient in no apparent distress; mood and affect are within normal limits.  A focused examination was performed including bil legs, feet. Relevant physical exam findings are noted in the Assessment and Plan.  L pretibial Pink plaque in site of previous blister     R medial foot bulla     Left Foot - Anterior Bulla R medial foot, pink plaque at site of previous blister L pretibial, pitting edema bil lower legs and feet        Assessment & Plan  Neoplasm of skin (2) -blisters of legs likely secondary to edema and lymphedema of the lower legs due to stasis changes and inability to elevate legs and also associated with multiple medical problems such as hypertension and diabetes mellitus and wheelchair-bound. Rule out bullous pemphigoid L pretibial  Skin / nail biopsy Type of biopsy: tangential   Informed consent: discussed and consent obtained   Timeout: patient name, date of birth, surgical site, and procedure verified   Procedure prep:  Patient was prepped and draped in usual sterile fashion Prep type:  Isopropyl alcohol Anesthesia: the lesion was anesthetized in a standard fashion   Anesthetic:  1% lidocaine w/ epinephrine 1-100,000 buffered w/ 8.4% NaHCO3 Instrument used: flexible razor blade   Outcome: patient tolerated procedure well   Post-procedure  details: sterile dressing applied and wound care instructions given   Dressing type: bacitracin and pressure dressing    Specimen 1 - Surgical pathology Differential Diagnosis: D48.5 R/O Bullous Pemphigoid vs other  Check Margins: No DIF Pink plaque in site of previous blister  R medial foot Skin / nail biopsy Type of biopsy: tangential   Informed consent: discussed and consent obtained   Timeout: patient name, date of birth, surgical site, and procedure verified   Procedure prep:  Patient was prepped and draped in usual sterile fashion Prep type:  Isopropyl alcohol Anesthesia: the lesion was anesthetized in a standard fashion   Anesthetic:  1% lidocaine w/ epinephrine 1-100,000 buffered w/ 8.4% NaHCO3 Instrument used: flexible razor blade   Outcome: patient tolerated procedure well   Post-procedure details: sterile dressing applied and wound care instructions given   Dressing type: bacitracin and pressure dressing    Specimen 2 - Surgical pathology Differential Diagnosis: D48.5 R/O Bullous Pemphigoid vs other Check Margins: No Bulla H&E  Blistering Left Foot - Anterior Hx of Blistering and Erosions/Ulcers of feet with weeping 2ndary to severe leg and foot edema. With cellulitis and positive culture - Pseudomonas aeruginosa and other gram negative rods Improved with Cipro 7 day course and Vinegar soaks  Recommend bx today to r/o Bullous Pemphigoid Start Mupirocin oint qd to open sores Cont TMC 0.1% cr qd up to 5d/week aa lower legs/feet until clear then prn flares, avoid f/g/a  If bx negative for Bullous Pemphigoid will recommend referral to the Lymphedema  clinic in [place]  Pt discussed home healthcare to help with dressing changes and medication application.  Advised that Dr. Army Melia would need to order that for her.  Topical steroids (such as triamcinolone, fluocinolone, fluocinonide, mometasone, clobetasol, halobetasol, betamethasone, hydrocortisone) can cause  thinning and lightening of the skin if they are used for too long in the same area. Your physician has selected the right strength medicine for your problem and area affected on the body. Please use your medication only as directed by your physician to prevent side effects.    mupirocin ointment (BACTROBAN) 2 % - Left Foot - Anterior Apply daily to open sores on legs/feet until resolved  Related Medications triamcinolone cream (KENALOG) 0.1 % Apply 1 Application topically daily as needed. Qd up to 5 days a week top of feet until clear, avoid face, groin, axilla  Hypertension; diabetes mellitus; and wheelchair-bound status Contributing to above conditions  Return for f/u pending biopsy results.  I, Othelia Pulling, RMA, am acting as scribe for Sarina Ser, MD . Documentation: I have reviewed the above documentation for accuracy and completeness, and I agree with the above.  Sarina Ser, MD

## 2022-07-02 ENCOUNTER — Other Ambulatory Visit: Payer: Self-pay

## 2022-07-02 ENCOUNTER — Other Ambulatory Visit: Payer: Self-pay | Admitting: Internal Medicine

## 2022-07-02 DIAGNOSIS — E8722 Chronic metabolic acidosis: Secondary | ICD-10-CM | POA: Diagnosis not present

## 2022-07-02 DIAGNOSIS — N182 Chronic kidney disease, stage 2 (mild): Secondary | ICD-10-CM | POA: Diagnosis not present

## 2022-07-02 DIAGNOSIS — E1122 Type 2 diabetes mellitus with diabetic chronic kidney disease: Secondary | ICD-10-CM | POA: Diagnosis not present

## 2022-07-02 DIAGNOSIS — E875 Hyperkalemia: Secondary | ICD-10-CM | POA: Diagnosis not present

## 2022-07-02 DIAGNOSIS — I10 Essential (primary) hypertension: Secondary | ICD-10-CM | POA: Diagnosis not present

## 2022-07-03 ENCOUNTER — Other Ambulatory Visit: Payer: Self-pay

## 2022-07-03 MED FILL — Insulin Pen Needle 31 G X 6 MM (1/4" or 15/64"): 30 days supply | Qty: 100 | Fill #0 | Status: AC

## 2022-07-03 NOTE — Telephone Encounter (Signed)
Requested Prescriptions  Pending Prescriptions Disp Refills  . UNIFINE PENTIPS 31G X 6 MM [place] [Pharmacy Med Name: Insulin Pen Needle (UNIFINE PENTIPS) 31G X 6 MM Misc] 100 each 0    Sig: Use as directed 3 times a day     Endocrinology: Diabetes - Testing Supplies Passed - 07/02/2022  4:39 PM      Passed - Valid encounter within last 12 months    Recent Outpatient Visits          1 month ago Toenail avulsion, initial encounter   [place] [facility] [place], Earley Abide, MD   1 month ago Extensor carpi ulnaris tendinitis   [place] [facility] [place], Earley Abide, MD   5 months ago Essential hypertension   [place] [facility] at [facility], Jesse Sans, MD   1 year ago Type II diabetes mellitus with complication [facility])   [place] [facility] at [facility], Jesse Sans, MD   1 year ago Type II diabetes mellitus with complication [facility])    [facility] Medicine at [facility], Jesse Sans, MD

## 2022-07-05 ENCOUNTER — Encounter: Payer: Self-pay | Admitting: Dermatology

## 2022-07-06 ENCOUNTER — Other Ambulatory Visit: Payer: Self-pay

## 2022-07-06 ENCOUNTER — Telehealth: Payer: Self-pay

## 2022-07-06 DIAGNOSIS — I890 Lymphedema, not elsewhere classified: Secondary | ICD-10-CM

## 2022-07-06 NOTE — Telephone Encounter (Signed)
-----   Message from Ralene Bathe, MD sent at 07/03/2022  1:28 PM EDT ----- Diagnosis 1. Direct Immunofluorescence, left pretibial NEGATIVE FOR IMMUNOREACTANTS 2. Skin , right medial foot INFLAMMATORY INTRAEPIDERMAL VESICULATION, SEE DESCRIPTION  1- Negative DIF NOT consistent with Bullous Pemphigoid 2- Blister Likely related to swelling of legs  If persistent blistering once fluid on legs is decreased with treatment, may consider repeat biopsy and DIF.  Refer patient to Lymphedema Clinic.

## 2022-07-06 NOTE — Telephone Encounter (Signed)
No answer and no voice mail.

## 2022-07-07 ENCOUNTER — Other Ambulatory Visit: Payer: Self-pay

## 2022-07-07 MED ORDER — VELTASSA 8.4 G PO PACK
PACK | ORAL | 11 refills | Status: DC
  Filled 2022-07-07 – 2022-07-17 (×2): qty 30, 30d supply, fill #0
  Filled 2022-11-24: qty 30, 30d supply, fill #1
  Filled 2023-03-08: qty 30, 30d supply, fill #2
  Filled 2023-05-31: qty 30, 30d supply, fill #3

## 2022-07-07 MED ORDER — CIPROFLOXACIN HCL 250 MG PO TABS: 250.0000 mg | ORAL_TABLET | Freq: Two times a day (BID) | ORAL | 0 refills | Status: AC

## 2022-07-08 ENCOUNTER — Telehealth: Payer: Self-pay

## 2022-07-08 NOTE — Telephone Encounter (Signed)
Tried calling patient on home and mobile to advise of bx results, no answer and no vm on either number.Maria Singh

## 2022-07-08 NOTE — Telephone Encounter (Signed)
-----   Message from Ralene Bathe, MD sent at 07/03/2022  1:28 PM EDT ----- Diagnosis 1. Direct Immunofluorescence, left pretibial NEGATIVE FOR IMMUNOREACTANTS 2. Skin , right medial foot INFLAMMATORY INTRAEPIDERMAL VESICULATION, SEE DESCRIPTION  1- Negative DIF NOT consistent with Bullous Pemphigoid 2- Blister Likely related to swelling of legs  If persistent blistering once fluid on legs is decreased with treatment, may consider repeat biopsy and DIF.  Refer patient to Lymphedema Clinic.

## 2022-07-15 ENCOUNTER — Other Ambulatory Visit: Payer: Self-pay

## 2022-07-17 ENCOUNTER — Other Ambulatory Visit: Payer: Self-pay

## 2022-07-21 ENCOUNTER — Other Ambulatory Visit: Payer: Self-pay

## 2022-07-30 ENCOUNTER — Other Ambulatory Visit: Payer: Self-pay

## 2022-07-30 DIAGNOSIS — R21 Rash and other nonspecific skin eruption: Secondary | ICD-10-CM

## 2022-07-30 MED ORDER — NYSTATIN 100000 UNIT/GM EX CREA: TOPICAL_CREAM | Freq: Two times a day (BID) | CUTANEOUS | 5 refills | Status: DC

## 2022-07-30 MED ORDER — NYSTATIN 100000 UNIT/GM EX POWD: 1.0000 | Freq: Three times a day (TID) | CUTANEOUS | 0 refills | Status: DC

## 2022-08-06 ENCOUNTER — Other Ambulatory Visit: Payer: Self-pay

## 2022-08-06 ENCOUNTER — Other Ambulatory Visit: Payer: Self-pay | Admitting: Internal Medicine

## 2022-08-06 DIAGNOSIS — E118 Type 2 diabetes mellitus with unspecified complications: Secondary | ICD-10-CM

## 2022-08-06 MED ORDER — INSULIN GLARGINE-YFGN 100 UNIT/ML ~~LOC~~ SOPN
PEN_INJECTOR | SUBCUTANEOUS | 1 refills | Status: DC
  Filled 2022-08-06: qty 30, 28d supply, fill #0
  Filled 2022-08-31: qty 30, 28d supply, fill #1

## 2022-08-06 NOTE — Telephone Encounter (Signed)
Requested medication (s) are due for refill today:yes  Requested medication (s) are on the active medication list: yes  Last refill:  06/04/22  Future visit scheduled: yes  Notes to clinic:  Unable to refill per protocol, Medication not assigned to a protocol, review manually     Requested Prescriptions  Pending Prescriptions Disp Refills   insulin glargine-yfgn (SEMGLEE, YFGN,) 100 UNIT/ML Pen 30 mL 1    Sig: Inject 60 Units into the skin every morning AND 50 Units every evening.     Off-Protocol Failed - 08/06/2022 11:32 AM      Failed - Medication not assigned to a protocol, review manually.      Passed - Valid encounter within last 12 months    Recent Outpatient Visits           2 months ago Toenail avulsion, initial encounter   [place] [facility] [place], Earley Abide, MD   2 months ago Extensor carpi ulnaris tendinitis   [place] [facility] [place], Earley Abide, MD   6 months ago Essential hypertension   [place] [facility] at [facility], Jesse Sans, MD   1 year ago Type II diabetes mellitus with complication [facility])    [facility] Medicine at [facility], Jesse Sans, MD   2 years ago Type II diabetes mellitus with complication [facility])   [place] [facility] at [facility], Jesse Sans, MD

## 2022-08-07 ENCOUNTER — Other Ambulatory Visit: Payer: Self-pay

## 2022-08-10 ENCOUNTER — Other Ambulatory Visit: Payer: Self-pay

## 2022-08-17 ENCOUNTER — Telehealth: Payer: Self-pay

## 2022-08-17 NOTE — Telephone Encounter (Signed)
Left pt a message that I called occupational therapy about our referral for the lymphedema clinic and Baker Janus said that Andrey Spearman is really backed up until the end of November.  I asked could they give Alekhya a call and go ahead and get her scheduled and she said they would.  I also sent a message to Dr. Hollace Kinnier assistant, Wyatt Haste advising above./sh

## 2022-08-19 ENCOUNTER — Ambulatory Visit (INDEPENDENT_AMBULATORY_CARE_PROVIDER_SITE_OTHER): Payer: 59 | Admitting: Dermatology

## 2022-08-19 DIAGNOSIS — S80822A Blister (nonthermal), left lower leg, initial encounter: Secondary | ICD-10-CM

## 2022-08-19 DIAGNOSIS — L97811 Non-pressure chronic ulcer of other part of right lower leg limited to breakdown of skin: Secondary | ICD-10-CM | POA: Diagnosis not present

## 2022-08-19 DIAGNOSIS — I890 Lymphedema, not elsewhere classified: Secondary | ICD-10-CM

## 2022-08-19 DIAGNOSIS — L089 Local infection of the skin and subcutaneous tissue, unspecified: Secondary | ICD-10-CM | POA: Diagnosis not present

## 2022-08-19 DIAGNOSIS — L98491 Non-pressure chronic ulcer of skin of other sites limited to breakdown of skin: Secondary | ICD-10-CM

## 2022-08-19 DIAGNOSIS — S80821A Blister (nonthermal), right lower leg, initial encounter: Secondary | ICD-10-CM | POA: Diagnosis not present

## 2022-08-19 DIAGNOSIS — T148XXA Other injury of unspecified body region, initial encounter: Secondary | ICD-10-CM

## 2022-08-19 DIAGNOSIS — L97901 Non-pressure chronic ulcer of unspecified part of unspecified lower leg limited to breakdown of skin: Secondary | ICD-10-CM

## 2022-08-19 DIAGNOSIS — L97821 Non-pressure chronic ulcer of other part of left lower leg limited to breakdown of skin: Secondary | ICD-10-CM | POA: Diagnosis not present

## 2022-08-19 MED ORDER — MUPIROCIN 2 % EX OINT: 1.0000 | TOPICAL_OINTMENT | Freq: Every day | CUTANEOUS | 3 refills | Status: DC

## 2022-08-19 MED ORDER — DOXYCYCLINE HYCLATE 100 MG PO CAPS: ORAL_CAPSULE | ORAL | 0 refills | Status: DC

## 2022-08-19 NOTE — Patient Instructions (Addendum)
[facility] supplies for Juxta Lyte compression wraps - [phone]  Due to recent changes in healthcare laws, you may see results of your pathology and/or laboratory studies on MyChart before the doctors have had a chance to review them. We understand that in some cases there may be results that are confusing or concerning to you. Please understand that not all results are received at the same time and often the doctors may need to interpret multiple results in order to provide you with the best plan of care or course of treatment. Therefore, we ask that you please give [place] 2 business days to thoroughly review all your results before contacting the office for clarification. Should we see a critical lab result, you will be contacted sooner.   If You Need Anything After Your Visit  If you have any questions or concerns for your doctor, please call our main line at [phone] and press option 4 to reach your doctor's medical assistant. If no one answers, please leave a voicemail as directed and we will return your call as soon as possible. Messages left after 4 pm will be answered the following business day.   You may also send [place] a message via [place]. We typically respond to MyChart messages within 1-2 business days.  For prescription refills, please ask your pharmacy to contact our office. Our fax number is [phone].  If you have an urgent issue when the clinic is closed that cannot wait until the next business day, you can page your doctor at the number below.    Please note that while we do our best to be available for urgent issues outside of office hours, we are not available 24/7.   If you have an urgent issue and are unable to reach [place], you may choose to seek medical care at your doctor's office, retail clinic, urgent care center, or emergency room.  If you have a medical emergency, please immediately call 911 or go to the emergency department.  Pager Numbers  - Dr. Nehemiah Massed:  [phone]  - Dr. Laurence Ferrari: [phone]  - Dr. Nicole Kindred: [phone]  In the event of inclement weather, please call our main line at [phone] for an update on the status of any delays or closures.  Dermatology Medication Tips: Please keep the boxes that topical medications come in in order to help keep track of the instructions about where and how to use these. Pharmacies typically print the medication instructions only on the boxes and not directly on the medication tubes.   If your medication is too expensive, please contact our office at [phone] option 4 or send [place] a message through [place].   We are unable to tell what your co-pay for medications will be in advance as this is different depending on your insurance coverage. However, we may be able to find a substitute medication at lower cost or fill out paperwork to get insurance to cover a needed medication.   If a prior authorization is required to get your medication covered by your insurance company, please allow [place] 1-2 business days to complete this process.  Drug prices often vary depending on where the prescription is filled and some pharmacies may offer cheaper prices.  The website www.goodrx.com contains coupons for medications through different pharmacies. The prices here do not account for what the cost may be with help from insurance (it may be cheaper with your insurance), but the website can give you the price if you did not use any insurance.  -  You can print the associated coupon and take it with your prescription to the pharmacy.  - You may also stop by our office during regular business hours and pick up a GoodRx coupon card.  - If you need your prescription sent electronically to a different pharmacy, notify our office through [facility] or by phone at [phone] option 4.     Si Usted Necesita Algo Despus de Su Visita  Tambin puede enviarnos un mensaje a travs de Pharmacist, community. Por lo general  respondemos a los mensajes de MyChart en el transcurso de 1 a 2 das hbiles.  Para renovar recetas, por favor pida a su farmacia que se ponga en contacto con nuestra oficina. Harland Dingwall de fax es [place] [phone].  Si tiene un asunto urgente cuando la clnica est cerrada y que no puede esperar hasta el siguiente da hbil, puede llamar/localizar a su doctor(a) al nmero que aparece a continuacin.   Por favor, tenga en cuenta que aunque hacemos todo lo posible para estar disponibles para asuntos urgentes fuera del horario de [place], no estamos disponibles las 24 horas del da, los 7 das de la [place].   Si tiene un problema urgente y no puede comunicarse con nosotros, puede optar por buscar atencin mdica  en el consultorio de su doctor(a), en una clnica privada, en un centro de atencin urgente o en una sala de emergencias.  Si tiene Engineering geologist, por favor llame inmediatamente al 911 o vaya a la sala de emergencias.  Nmeros de bper  - Dr. Nehemiah Massed: [phone]  - Dra. Moye: [phone]  - Dra. Nicole Kindred: [phone]  En caso de inclemencias del [place], por favor llame a Johnsie Kindred principal al [phone] para una actualizacin sobre el [place] de cualquier retraso o cierre.  Consejos para la medicacin en dermatologa: Por favor, guarde las cajas en las que vienen los medicamentos de uso tpico para ayudarle a seguir las instrucciones sobre dnde y cmo usarlos. Las farmacias generalmente imprimen las instrucciones del medicamento slo en las cajas y no directamente en los tubos del [place].   Si su medicamento es muy caro, por favor, pngase en contacto con Zigmund Daniel llamando al [phone] y presione la opcin 4 o envenos un mensaje a travs de Pharmacist, community.   No podemos decirle cul ser su copago por los medicamentos por adelantado ya que esto es diferente dependiendo de la cobertura de su seguro. Sin embargo, es posible que podamos encontrar un  medicamento sustituto a Electrical engineer un formulario para que el seguro cubra el medicamento que se considera necesario.   Si se requiere una autorizacin previa para que su compaa de seguros [place] su medicamento, por favor permtanos de 1 a 2 das hbiles para completar este proceso.  Los precios de los medicamentos varan con frecuencia dependiendo del Environmental consultant de dnde se surte la receta y alguna farmacias pueden ofrecer precios ms baratos.  El sitio web www.goodrx.com tiene cupones para medicamentos de Airline pilot. Los precios aqu no tienen en cuenta lo que podra costar con la ayuda del seguro (puede ser ms barato con su seguro), pero el sitio web puede darle el precio si no utiliz Research scientist (physical sciences).  - Puede imprimir el cupn correspondiente y llevarlo con su receta a la farmacia.  - Tambin puede pasar por nuestra oficina durante el horario de atencin regular y Charity fundraiser una tarjeta de cupones de GoodRx.  - Si necesita que su receta se enve electrnicamente a una farmacia diferente, informe  a nuestra oficina a travs de MyChart de [place] o por telfono llamando al [phone] y presione la opcin 4.

## 2022-08-19 NOTE — Progress Notes (Signed)
   Follow-Up Visit   Subjective  Maria Singh is a 71 y.o. female who presents for the following: Ulcerations (On the legs - patient and wife concerned that ulcerations may be infected and would like them checked today. They still haven't heard from lymphedema clinic about her referral).  Patient accompanied by wife who contributes to history.   The following portions of the chart were reviewed this encounter and updated as appropriate:   Tobacco  Allergies  Meds  Problems  Med Hx  Surg Hx  Fam Hx     Review of Systems:  No other skin or systemic complaints except as noted in HPI or Assessment and Plan.  Objective  Well appearing patient in no apparent distress; mood and affect are within normal limits.  A focused examination was performed including B/L lower leg and face. Relevant physical exam findings are noted in the Assessment and Plan.  B/L leg Crusts on the R foot and L lower leg.  L lower leg, R foot Healing ulcerations.         Lower legs and feet Edema.   Assessment & Plan  Local infection of skin and subcutaneous tissue with Lymphedema; Edema with associated blistering and Ulceration B/L leg See photos Start Doxycycline '100mg'$  po BID x 7 days. Doxycycline should be taken with food to prevent nausea. Do not lay down for 30 minutes after taking. Be cautious with sun exposure and use good sun protection while on this medication. Pregnant women should not take this medication.   Start Mupirocin 2% ointment to aa's QD PRN.   Wounds debrided today, Mupirocin 2% ointment applied, and covered with bandage. Prescription sent for Juxta Lyte compression wraps to wear during the day.   doxycycline (VIBRAMYCIN) 100 MG capsule - B/L leg Take one tab po BID x 7 days with food.  Blistering Related Medications triamcinolone cream (KENALOG) 0.1 % Apply 1 Application topically daily as needed. Qd up to 5 days a week top of feet until clear, avoid face, groin,  axilla  mupirocin ointment (BACTROBAN) 2 % Apply daily to open sores on legs/feet until resolved  Lower extremity ulceration, unspecified laterality, limited to breakdown of skin (HCC) L lower leg, R foot Wounds debrided today, Mupirocin 2% ointment applied, and covered with bandage. Prescription sent for Juxta Lyte compression wraps to wear during the day.   Lymphedema Lower legs and feet Reviewed pathology results with patient and wife -  Reviewed referral information with patient and wife. They will contact the lymphedema clinic to schedule an appointment. Juxta Lyte compression wraps sent to [facility] medical supply pharmacy.  Return for follow up - schedule at the same time as wife's appointment in December.  Luther Redo, CMA, am acting as scribe for Sarina Ser, MD . Documentation: I have reviewed the above documentation for accuracy and completeness, and I agree with the above.  Sarina Ser, MD

## 2022-08-20 DIAGNOSIS — F4320 Adjustment disorder, unspecified: Secondary | ICD-10-CM | POA: Diagnosis not present

## 2022-08-25 ENCOUNTER — Telehealth: Payer: Self-pay

## 2022-08-25 NOTE — Telephone Encounter (Signed)
[facility] needs length, ankle and calf measurements for patient's wrap. Patient advised and will have spouse measure and report back to [place].  Patient will send in [place] or call in measurements.

## 2022-08-28 ENCOUNTER — Telehealth: Payer: Self-pay

## 2022-08-31 ENCOUNTER — Other Ambulatory Visit: Payer: Self-pay

## 2022-08-31 NOTE — Telephone Encounter (Signed)
Unable to leave a voicemail on patient's wife's phone as the voicemail is not set up. In order to receive the Juxta Lyte compression wraps we need measurements of her calf, ankle, and the length of the lower leg. Patient can come into office and be measured or pt's wife can measure legs and send [place] a Mychart message with the measurements.

## 2022-09-04 ENCOUNTER — Encounter: Payer: Self-pay | Admitting: Dermatology

## 2022-09-07 ENCOUNTER — Telehealth: Payer: Self-pay

## 2022-09-07 NOTE — Telephone Encounter (Signed)
-----   Message from Rosaria Ferries, [place] sent at 08/28/2022 12:34 PM EST ----- Regarding: [facility] called and stated that they needed patient's leg measurements before they can fill Juxta Lyte prescriptions. Their fax number is [phone].

## 2022-09-07 NOTE — Telephone Encounter (Signed)
Discussed with Maria Singh where to take measurements. She also asked if she was supposed to contact the lymphedema clinic or if our office was. According to previous note she should contact lymphedema clinic and I gave them OT number at the hospital.

## 2022-09-23 ENCOUNTER — Other Ambulatory Visit: Payer: Self-pay | Admitting: Internal Medicine

## 2022-09-23 ENCOUNTER — Other Ambulatory Visit: Payer: Self-pay

## 2022-09-23 DIAGNOSIS — E109 Type 1 diabetes mellitus without complications: Secondary | ICD-10-CM

## 2022-09-23 DIAGNOSIS — E118 Type 2 diabetes mellitus with unspecified complications: Secondary | ICD-10-CM

## 2022-09-23 DIAGNOSIS — E782 Mixed hyperlipidemia: Secondary | ICD-10-CM

## 2022-09-23 DIAGNOSIS — E1165 Type 2 diabetes mellitus with hyperglycemia: Secondary | ICD-10-CM

## 2022-09-23 MED ORDER — NYSTATIN 100000 UNIT/GM EX POWD
1.0000 | Freq: Three times a day (TID) | CUTANEOUS | 0 refills | Status: DC
  Filled 2022-09-23: qty 15, 15d supply, fill #0

## 2022-09-23 MED ORDER — ROSUVASTATIN CALCIUM 5 MG PO TABS
5.0000 mg | ORAL_TABLET | Freq: Every day | ORAL | 0 refills | Status: DC
  Filled 2022-09-23: qty 90, fill #0
  Filled 2022-09-23: qty 90, 90d supply, fill #0

## 2022-09-23 MED FILL — Insulin Pen Needle 31 G X 6 MM (1/4" or 15/64"): 30 days supply | Qty: 100 | Fill #0 | Status: AC

## 2022-09-23 MED FILL — Insulin Glargine-yfgn Soln Pen-Injector 100 Unit/ML: SUBCUTANEOUS | 28 days supply | Qty: 30 | Fill #0 | Status: AC

## 2022-09-23 MED FILL — Rosuvastatin Calcium Tab 5 MG: ORAL | 90 days supply | Qty: 90 | Fill #0 | Status: CN

## 2022-09-23 MED FILL — Metformin HCl Tab 1000 MG: ORAL | 90 days supply | Qty: 180 | Fill #0 | Status: AC

## 2022-09-23 MED FILL — Icosapent Ethyl Cap 1 GM: ORAL | 90 days supply | Qty: 360 | Fill #0 | Status: AC

## 2022-09-23 NOTE — Telephone Encounter (Signed)
Accidentally reordered Crestor which expired 09/22/22.  icosapent: last RF 12/12/21 #360 1 RF Is on active med list refill due (not assigned to protocol)  Semglee last RF 08/06/22 30 ml 2 RF Active med list: yes Future visits: no  (med not assigned to a protocol)    Requested Prescriptions  Pending Prescriptions Disp Refills   VASCEPA 1 g capsule [Pharmacy Med Name: icosapent Ethyl (VASCEPA) 1 g capsule] 360 capsule     Sig: Take 2 capsules (2 g total) by mouth 2 (two) times daily.     Off-Protocol Failed - 09/23/2022  9:46 AM      Failed - Medication not assigned to a protocol, review manually.      Passed - Valid encounter within last 12 months    Recent Outpatient Visits           4 months ago Toenail avulsion, initial encounter   [place] [facility] [place], Earley Abide, MD   4 months ago Extensor carpi ulnaris tendinitis   [place] [facility] at [place], Earley Abide, MD   7 months ago Essential hypertension   [place] [facility] at [facility], Jesse Sans, MD   1 year ago Type II diabetes mellitus with complication [facility])   [place] [facility] at [facility], Jesse Sans, MD   2 years ago Type II diabetes mellitus with complication [facility])   [place] [facility] at [facility], Jesse Sans, MD       Future Appointments             In 2 months Ralene Bathe, MD [place], YFGN, 100 UNIT/ML Pen [Pharmacy Med Name: insulin glargine-yfgn (SEMGLEE, YFGN,) 100 UNIT/ML Pen] 30 mL     Sig: Inject 60 Units into the skin every morning AND 50 Units every evening.     Off-Protocol Failed - 09/23/2022  9:46 AM      Failed - Medication not assigned to a protocol, review manually.      Passed - Valid encounter within last 12 months    Recent Outpatient Visits            4 months ago Toenail avulsion, initial encounter   [place] [facility] at [place], Earley Abide, MD   4 months ago Extensor carpi ulnaris tendinitis   [place] [facility] at [place], Earley Abide, MD   7 months ago Essential hypertension   [place] [facility] at [facility], Jesse Sans, MD   1 year ago Type II diabetes mellitus with complication [facility])   [place] [facility] at [facility], Jesse Sans, MD   2 years ago Type II diabetes mellitus with complication [facility])   [place] [facility] at [facility], Jesse Sans, MD       Future Appointments             In 2 months Ralene Bathe, MD [place]             rosuvastatin (CRESTOR) 5 MG tablet 90 tablet     Sig: TAKE 1 TABLET BY MOUTH DAILY.     There is  no refill protocol information for this order    Signed Prescriptions Disp Refills   Insulin Pen Needle (UNIFINE PENTIPS) 31G X 6 MM MISC 100 each 0    Sig: Use as directed 3 times a day     Endocrinology: Diabetes - Testing Supplies Passed - 09/23/2022  9:46 AM      Passed - Valid encounter within last 12 months    Recent Outpatient Visits           4 months ago Toenail avulsion, initial encounter   [place] [facility] at [facility], Earley Abide, MD   4 months ago Extensor carpi ulnaris tendinitis   [place] [facility] at [place], Earley Abide, MD   7 months ago Essential hypertension   [place] [facility] at [facility], Jesse Sans, MD   1 year ago Type II diabetes mellitus with complication [facility])   [place] [facility] at [facility] Jesse Sans, MD   2 years ago Type II diabetes mellitus with complication [facility])   [place]  [facility] at [facility], Jesse Sans, MD       Future Appointments             In 2 months Ralene Bathe, MD [place]             metFORMIN (GLUCOPHAGE) 1000 MG tablet 180 tablet 0    Sig: TAKE 1 TABLET BY MOUTH 2 TIMES DAILY     Endocrinology:  Diabetes - Biguanides Failed - 09/23/2022  9:46 AM      Failed - B12 Level in normal range and within 720 days    No results found for: "VITAMINB12"       Failed - CBC within normal limits and completed in the last 12 months    WBC  Date Value Ref Range Status  01/15/2022 6.5  Final  12/08/2019 7.3 4.0 - 10.5 K/uL Final   RBC  Date Value Ref Range Status  12/08/2019 3.94 3.87 - 5.11 MIL/uL Final   Hemoglobin  Date Value Ref Range Status  01/15/2022 11.3 (A) 12.0 - 16.0 Final  05/17/2019 11.3 11.1 - 15.9 g/dL Final   HCT  Date Value Ref Range Status  01/15/2022 34 (A) 36 - 46 Final   Hematocrit  Date Value Ref Range Status  05/17/2019 35.0 34.0 - 46.6 % Final   MCHC  Date Value Ref Range Status  12/08/2019 31.6 30.0 - 36.0 g/dL Final   [facility]  Date Value Ref Range Status  12/08/2019 28.2 26.0 - 34.0 pg Final   MCV  Date Value Ref Range Status  12/08/2019 89.1 80.0 - 100.0 fL Final  05/17/2019 86 79 - 97 fL Final  08/09/2013 84 80 - 100 fL Final   No results found for: "PLTCOUNTKUC", "LABPLAT", "POCPLA" RDW  Date Value Ref Range Status  12/08/2019 14.9 11.5 - 15.5 % Final  05/17/2019 15.1 11.7 - 15.4 % Final  08/09/2013 15.3 (H) 11.5 - 14.5 % Final         Passed - Cr in normal range and within 360 days    Creatinine  Date Value Ref Range Status  08/11/2013 1.08 0.60 - 1.30 mg/dL Final   Creatinine, Ser  Date Value Ref Range Status  02/09/2022 0.77 0.44 - 1.00 mg/dL Final   Creatinine, Urine  Date Value  Ref Range Status  12/07/2019 21 mg/dL Final    Comment:    Performed at [facility], [address] [place], [place] [address]          Passed - HBA1C is between 0 and 7.9 and within 180 days    Hemoglobin A1C  Date Value Ref Range Status  01/15/2022 6.1  Final   Hgb A1c MFr Bld  Date Value Ref Range Status  05/12/2022 5.6 4.8 - 5.6 % Final    Comment:    (NOTE) Pre diabetes:          5.7%-6.4%  Diabetes:              >6.4%  Glycemic control for   <7.0% adults with diabetes          Passed - eGFR in normal range and within 360 days    EGFR (African American)  Date Value Ref Range Status  08/11/2013 >60  Final   GFR calc Af Amer  Date Value Ref Range Status  10/01/2020 75 >59 mL/min/1.73 Final    Comment:    **In accordance with recommendations from the NKF-ASN Task force,**   [facility] is in the process of updating its eGFR calculation to the   2021 CKD-EPI creatinine equation that estimates kidney function   without a race variable.    EGFR (Non-African Amer.)  Date Value Ref Range Status  08/11/2013 55 (L)  Final    Comment:    eGFR values <98m/min/1.73 m2 may be an indication of chronic kidney disease (CKD). Calculated eGFR is useful in patients with stable renal function. The eGFR calculation will not be reliable in acutely ill patients when serum creatinine is changing rapidly. It is not useful in  patients on dialysis. The eGFR calculation may not be applicable to patients at the low and high extremes of body sizes, pregnant women, and vegetarians.    GFR, Estimated  Date Value Ref Range Status  02/09/2022 >60 >60 mL/min Final    Comment:    (NOTE) Calculated using the CKD-EPI Creatinine Equation (2021)    eGFR  Date Value Ref Range Status  01/15/2022 75  Final         Passed - Valid encounter within last 6 months    Recent Outpatient Visits           4 months ago Toenail avulsion, initial encounter   [place] [facility] [place]ley [place] MD   4 months ago Extensor carpi ulnaris tendinitis   [place] [facility] [place]arley [place]   7 months ago Essential hypertension   [place] [facility] MD   1 year ago Type II diabetes mellitus with complication ([facility]   [place] [facility]   2 years ago Type II diabetes mellitus with complication ([facility]   [place] [facility] L[facility] MD       Future Appointments             In 2 months KRalene Bathe MD [place]            rosuvastatin (CRESTOR) 5 MG tablet 90 tablet 3    Sig: TAKE 1 TABLET BY MOUTH DAILY.     Cardiovascular:  Antilipid -  Statins 2 Failed - 09/23/2022  9:46 AM      Failed - Lipid Panel in normal range within the last 12 months    Cholesterol, Total  Date Value Ref Range Status  10/01/2020 178 100 - 199 mg/dL Final   Cholesterol  Date Value Ref Range Status  02/09/2022 168 0 - 200 mg/dL Final   LDL Chol Calc (NIH)  Date Value Ref Range Status  10/01/2020 89 0 - 99 mg/dL Final   LDL Cholesterol  Date Value Ref Range Status  02/09/2022 UNABLE TO CALCULATE IF TRIGLYCERIDE OVER 400 mg/dL 0 - 99 mg/dL Final    Comment:           Total Cholesterol/HDL:CHD Risk Coronary Heart Disease Risk Table                     Men   Women  1/2 Average Risk   3.4   3.3  Average Risk       5.0   4.4  2 X Average Risk   9.6   7.1  3 X Average Risk  23.4   11.0        Use the calculated Patient Ratio above and the CHD Risk Table to determine the patient's CHD Risk.        ATP III CLASSIFICATION (LDL):  <100     mg/dL   Optimal  100-129  mg/dL   Near or Above                    Optimal  130-159  mg/dL   Borderline  160-189  mg/dL   High  >190     mg/dL   Very High Performed at [facility], [address] [place], [place] [address]    Direct LDL  Date Value Ref Range Status  02/09/2022 90.2  0 - 99 mg/dL Final    Comment:    Performed at [facility] [address] [place], [place] [address]   HDL  Date Value Ref Range Status  02/09/2022 34 (L) >40 mg/dL Final  10/01/2020 29 (L) >39 mg/dL Final   Triglycerides  Date Value Ref Range Status  02/09/2022 484 (H) <150 mg/dL Final         Passed - Cr in normal range and within 360 days    Creatinine  Date Value Ref Range Status  08/11/2013 1.08 0.60 - 1.30 mg/dL Final   Creatinine, Ser  Date Value Ref Range Status  02/09/2022 0.77 0.44 - 1.00 mg/dL Final   Creatinine, Urine  Date Value Ref Range Status  12/07/2019 21 mg/dL Final    Comment:    Performed at [facility], [address] [place], [place] [address]         Passed - Patient is not pregnant      Passed - Valid encounter within last 12 months    Recent Outpatient Visits           4 months ago Toenail avulsion, initial encounter   [place] [facility] at [place], MD   4 months ago Extensor carpi ulnaris tendinitis   [place] [facility] [place] [facility] [place], Earley Abide, MD   7 months ago Essential hypertension   [place] and [facility] Medicine at [facility], Jesse Sans, MD   1 year ago Type II diabetes mellitus with complication ([place])  [place] [facility] at [facility], Jesse Sans, MD   2 years ago Type II diabetes mellitus with complication [facility])   [place] [facility] at [facility], Jesse Sans, MD       Future Appointments             In 2 months Ralene Bathe, MD [place]

## 2022-09-23 NOTE — Telephone Encounter (Signed)
Requested Prescriptions  Pending Prescriptions Disp Refills   VASCEPA 1 g capsule [Pharmacy Med Name: icosapent Ethyl (VASCEPA) 1 g capsule] 360 capsule     Sig: Take 2 capsules (2 g total) by mouth 2 (two) times daily.     Off-Protocol Failed - 09/23/2022  9:46 AM      Failed - Medication not assigned to a protocol, review manually.      Passed - Valid encounter within last 12 months    Recent Outpatient Visits           4 months ago Toenail avulsion, initial encounter   [place] [facility] at [place], Earley Abide, MD   4 months ago Extensor carpi ulnaris tendinitis   [place] [facility] at [place], Earley Abide, MD   7 months ago Essential hypertension   [place] [facility] at [facility], Jesse Sans, MD   1 year ago Type II diabetes mellitus with complication [facility])   [place] [facility] at [facility], Jesse Sans, MD   2 years ago Type II diabetes mellitus with complication [facility])   [place] [facility] at [facility], Jesse Sans, MD       Future Appointments             In 2 months Ralene Bathe, MD [place], YFGN, 100 UNIT/ML Pen [Pharmacy Med Name: insulin glargine-yfgn (SEMGLEE, YFGN,) 100 UNIT/ML Pen] 30 mL     Sig: Inject 60 Units into the skin every morning AND 50 Units every evening.     Off-Protocol Failed - 09/23/2022  9:46 AM      Failed - Medication not assigned to a protocol, review manually.      Passed - Valid encounter within last 12 months    Recent Outpatient Visits           4 months ago Toenail avulsion, initial encounter   [place] [facility] at [place], Earley Abide, MD   4 months ago Extensor carpi ulnaris tendinitis   [place] [facility] at [place], Earley Abide, MD   7 months ago Essential hypertension   [place] [facility] at [facility], Jesse Sans, MD   1 year ago Type II diabetes mellitus with complication [facility])   [place] [facility] at [facility], Jesse Sans, MD   2 years ago Type II diabetes mellitus with complication [facility])   [place] [facility] at [facility], Jesse Sans, MD       Future Appointments             In 2 months Ralene Bathe, MD [place] X 6 MM [place]appa [Pharmacy Med Name: Insulin Pen Needle (UNIFINE PENTIPS) 31G X 6 MM Misc] 100 each 0    Sig: Use as directed 3 times a day     Endocrinology: Diabetes - Testing Supplies Passed - 09/23/2022  9:46 AM      Passed - Valid encounter within last 12 months    Recent Outpatient Visits  4 months ago Toenail avulsion, initial encounter   [place] [facility] at [facility], Earley Abide, MD   4 months ago Extensor carpi ulnaris tendinitis   [place] [facility] at [facility], Earley Abide, MD   7 months ago Essential hypertension   [place] [facility] at [facility], Jesse Sans, MD   1 year ago Type II diabetes mellitus with complication [facility])   [place] [facility] at [facility], Jesse Sans, MD   2 years ago Type II diabetes mellitus with complication [facility])   [place] [facility] at [facility], Jesse Sans, MD       Future Appointments             In 2 months Ralene Bathe, MD [place]             metFORMIN (GLUCOPHAGE) 1000 MG tablet 180 tablet 0    Sig: TAKE 1 TABLET BY MOUTH 2 TIMES DAILY     Endocrinology:  Diabetes - Biguanides Failed - 09/23/2022  9:46 AM      Failed - B12 Level in normal  range and within 720 days    No results found for: "VITAMINB12"       Failed - CBC within normal limits and completed in the last 12 months    WBC  Date Value Ref Range Status  01/15/2022 6.5  Final  12/08/2019 7.3 4.0 - 10.5 K/uL Final   RBC  Date Value Ref Range Status  12/08/2019 3.94 3.87 - 5.11 MIL/uL Final   Hemoglobin  Date Value Ref Range Status  01/15/2022 11.3 (A) 12.0 - 16.0 Final  05/17/2019 11.3 11.1 - 15.9 g/dL Final   HCT  Date Value Ref Range Status  01/15/2022 34 (A) 36 - 46 Final   Hematocrit  Date Value Ref Range Status  05/17/2019 35.0 34.0 - 46.6 % Final   MCHC  Date Value Ref Range Status  12/08/2019 31.6 30.0 - 36.0 g/dL Final   [facility]  Date Value Ref Range Status  12/08/2019 28.2 26.0 - 34.0 pg Final   MCV  Date Value Ref Range Status  12/08/2019 89.1 80.0 - 100.0 fL Final  05/17/2019 86 79 - 97 fL Final  08/09/2013 84 80 - 100 fL Final   No results found for: "PLTCOUNTKUC", "LABPLAT", "POCPLA" RDW  Date Value Ref Range Status  12/08/2019 14.9 11.5 - 15.5 % Final  05/17/2019 15.1 11.7 - 15.4 % Final  08/09/2013 15.3 (H) 11.5 - 14.5 % Final         Passed - Cr in normal range and within 360 days    Creatinine  Date Value Ref Range Status  08/11/2013 1.08 0.60 - 1.30 mg/dL Final   Creatinine, Ser  Date Value Ref Range Status  02/09/2022 0.77 0.44 - 1.00 mg/dL Final   Creatinine, Urine  Date Value Ref Range Status  12/07/2019 21 mg/dL Final    Comment:    Performed at [facility], [address] [place], [place] [address]         Passed - HBA1C is between 0 and 7.9 and within 180 days    Hemoglobin A1C  Date Value Ref Range Status  01/15/2022 6.1  Final   Hgb A1c MFr Bld  Date Value Ref Range Status  05/12/2022 5.6 4.8 - 5.6 % Final  Comment:    (NOTE) Pre diabetes:          5.7%-6.4%  Diabetes:              >6.4%  Glycemic control for   <7.0% adults with diabetes          Passed - eGFR in normal range  and within 360 days    EGFR (African American)  Date Value Ref Range Status  08/11/2013 >60  Final   GFR calc Af Amer  Date Value Ref Range Status  10/01/2020 75 >59 mL/min/1.73 Final    Comment:    **In accordance with recommendations from the NKF-ASN Task force,**   [facility] is in the process of updating its eGFR calculation to the   2021 CKD-EPI creatinine equation that estimates kidney function   without a race variable.    EGFR (Non-African Amer.)  Date Value Ref Range Status  08/11/2013 55 (L)  Final    Comment:    eGFR values <73m/min/1.73 m2 may be an indication of chronic kidney disease (CKD). Calculated eGFR is useful in patients with stable renal function. The eGFR calculation will not be reliable in acutely ill patients when serum creatinine is changing rapidly. It is not useful in  patients on dialysis. The eGFR calculation may not be applicable to patients at the low and high extremes of body sizes, pregnant women, and vegetarians.    GFR, Estimated  Date Value Ref Range Status  02/09/2022 >60 >60 mL/min Final    Comment:    (NOTE) Calculated using the CKD-EPI Creatinine Equation (2021)    eGFR  Date Value Ref Range Status  01/15/2022 75  Final         Passed - Valid encounter within last 6 months    Recent Outpatient Visits           4 months ago Toenail avulsion, initial encounter   [place] [facility] [place]   4 months ago Extensor carpi ulnaris tendinitis   [place] [facility] [place]ar[place] Abide [place]   7 months ago Essential hypertension   [place] [facility] L[facility] Sans MD   1 year ago Type II diabetes mellitus with complication ([facility]   [place] [facility] L[facility]se Sans MD   2 years ago Type II diabetes mellitus with complication ([facility]   [facility] MD       Future Appointments             In 2 months KRalene Bathe MD [place]            rosuvastatin (CRESTOR) 5 MG tablet 90 tablet 3    Sig: TAKE 1 TABLET BY MOUTH DAILY.     Cardiovascular:  Antilipid - Statins 2 Failed - 09/23/2022  9:46 AM      Failed - Lipid Panel in normal range within the last 12 months    Cholesterol, Total  Date Value Ref Range Status  10/01/2020 178 100 - 199 mg/dL Final   Cholesterol  Date Value Ref Range Status  02/09/2022 168 0 - 200 mg/dL Final   LDL Chol Calc (NIH)  Date Value Ref Range Status  10/01/2020 89 0 - 99 mg/dL Final   LDL Cholesterol  Date Value Ref Range Status  02/09/2022 UNABLE TO CALCULATE IF TRIGLYCERIDE OVER 400 mg/dL 0 - 99 mg/dL Final    Comment:           Total Cholesterol/HDL:CHD Risk Coronary Heart Disease Risk Table                     Men   Women  1/2 Average Risk   3.4   3.3  Average Risk       5.0   4.4  2 X Average Risk   9.6   7.1  3 X Average Risk  23.4   11.0        Use the calculated Patient Ratio above and the CHD Risk Table to determine the patient's CHD Risk.        ATP III CLASSIFICATION (LDL):  <100     mg/dL   Optimal  100-129  mg/dL   Near or Above                    Optimal  130-159  mg/dL   Borderline  160-189  mg/dL   High  >190     mg/dL   Very High Performed at [facility], [address] [place], [place] [address]    Direct LDL  Date Value Ref Range Status  02/09/2022 90.2 0 - 99 mg/dL Final    Comment:    Performed at [place] [address] [place], [place] [address]   HDL  Date Value Ref Range Status  02/09/2022 34 (L) >40 mg/dL Final  10/01/2020 29 (L) >39 mg/dL Final   Triglycerides  Date Value Ref Range Status  02/09/2022 484 (H) <150 mg/dL Final         Passed - Cr in normal range and within 360 days    Creatinine  Date Value Ref Range  Status  08/11/2013 1.08 0.60 - 1.30 mg/dL Final   Creatinine, Ser  Date Value Ref Range Status  02/09/2022 0.77 0.44 - 1.00 mg/dL Final   Creatinine, Urine  Date Value Ref Range Status  12/07/2019 21 mg/dL Final    Comment:    Performed at [facility], [address], [place], [place] [address]         Passed - Patient is not pregnant      Passed - Valid encounter within last 12 months    Recent Outpatient Visits           4 months ago Toenail avulsion, initial encounter   [place] [facility] at [place], Earley Abide, MD   4 months ago Extensor carpi ulnaris tendinitis   [place] [facility] [place] at [place], Earley Abide, MD   7 months ago Essential hypertension   [place] [facility] at [facility], Jesse Sans, MD   1 year ago Type II diabetes mellitus with complication [facility])   [place] [facility] at [facility], Jesse Sans, MD   2 years ago Type II diabetes mellitus with complication [facility])   [place] [facility] at [facility], Jesse Sans, MD       Future Appointments             In 2 months Ralene Bathe, MD [place]

## 2022-09-23 NOTE — Telephone Encounter (Signed)
Requested medication (s) are due for refill today: Vascepa  Requested medication (s) are on the active medication list: yes Semglee: no  Last refill:  Crestor 09/10/21 #90 3 RF end 09/22/22 Semglee 08/06/22 30 ml 1 RF  Future visit scheduled: no  Notes to clinic:  Crestor has expired - other meds are not assigned to a protocol   Requested Prescriptions  Pending Prescriptions Disp Refills   VASCEPA 1 g capsule [Pharmacy Med Name: icosapent Ethyl (VASCEPA) 1 g capsule] 360 capsule     Sig: Take 2 capsules (2 g total) by mouth 2 (two) times daily.     Off-Protocol Failed - 09/23/2022  1:56 PM      Failed - Medication not assigned to a protocol, review manually.      Passed - Valid encounter within last 12 months    Recent Outpatient Visits           4 months ago Toenail avulsion, initial encounter   [place] [facility] [place], Earley Abide, MD   4 months ago Extensor carpi ulnaris tendinitis   [place] [facility] at [place], Earley Abide, MD   7 months ago Essential hypertension   [place] [facility] at [facility], Jesse Sans, MD   1 year ago Type II diabetes mellitus with complication [facility])   [place] [facility] at [facility], Jesse Sans, MD   2 years ago Type II diabetes mellitus with complication [facility])   [place] [facility] at [facility], Jesse Sans, MD       Future Appointments             In 2 months Ralene Bathe, MD [place], YFGN, 100 UNIT/ML Pen [Pharmacy Med Name: insulin glargine-yfgn (SEMGLEE, YFGN,) 100 UNIT/ML Pen] 30 mL     Sig: Inject 60 Units into the skin every morning AND 50 Units every evening.     Off-Protocol Failed - 09/23/2022  1:56 PM      Failed - Medication not assigned to a protocol, review manually.      Passed -  Valid encounter within last 12 months    Recent Outpatient Visits           4 months ago Toenail avulsion, initial encounter   [place] [facility] at [place], Earley Abide, MD   4 months ago Extensor carpi ulnaris tendinitis   [place] [facility] at [place], Earley Abide, MD   7 months ago Essential hypertension   [place] [facility] at [facility], Jesse Sans, MD   1 year ago Type II diabetes mellitus with complication [facility])   [place] [facility] at [facility], Jesse Sans, MD   2 years ago Type II diabetes mellitus with complication [facility])    Primary Care and Sports Medicine at [facility], Jesse Sans, MD       Future Appointments             In 2 months Ralene Bathe, MD [place]             rosuvastatin (CRESTOR) 5 MG tablet 90 tablet     Sig:  TAKE 1 TABLET BY MOUTH DAILY.     Cardiovascular:  Antilipid - Statins 2 Failed - 09/23/2022  1:56 PM      Failed - Lipid Panel in normal range within the last 12 months    Cholesterol, Total  Date Value Ref Range Status  10/01/2020 178 100 - 199 mg/dL Final   Cholesterol  Date Value Ref Range Status  02/09/2022 168 0 - 200 mg/dL Final   LDL Chol Calc (NIH)  Date Value Ref Range Status  10/01/2020 89 0 - 99 mg/dL Final   LDL Cholesterol  Date Value Ref Range Status  02/09/2022 UNABLE TO CALCULATE IF TRIGLYCERIDE OVER 400 mg/dL 0 - 99 mg/dL Final    Comment:           Total Cholesterol/HDL:CHD Risk Coronary Heart Disease Risk Table                     Men   Women  1/2 Average Risk   3.4   3.3  Average Risk       5.0   4.4  2 X Average Risk   9.6   7.1  3 X Average Risk  23.4   11.0        Use the calculated Patient Ratio above and the CHD Risk Table to determine the patient's CHD Risk.        ATP III CLASSIFICATION (LDL):  <100      mg/dL   Optimal  100-129  mg/dL   Near or Above                    Optimal  130-159  mg/dL   Borderline  160-189  mg/dL   High  >190     mg/dL   Very High Performed at [facility], [address] [place], [place] [address]    Direct LDL  Date Value Ref Range Status  02/09/2022 90.2 0 - 99 mg/dL Final    Comment:    Performed at [facility] [address], [place], [place] [address]   HDL  Date Value Ref Range Status  02/09/2022 34 (L) >40 mg/dL Final  10/01/2020 29 (L) >39 mg/dL Final   Triglycerides  Date Value Ref Range Status  02/09/2022 484 (H) <150 mg/dL Final         Passed - Cr in normal range and within 360 days    Creatinine  Date Value Ref Range Status  08/11/2013 1.08 0.60 - 1.30 mg/dL Final   Creatinine, Ser  Date Value Ref Range Status  02/09/2022 0.77 0.44 - 1.00 mg/dL Final   Creatinine, Urine  Date Value Ref Range Status  12/07/2019 21 mg/dL Final    Comment:    Performed at [facility], [address], [place], [place] [address]         Passed - Patient is not pregnant      Passed - Valid encounter within last 12 months    Recent Outpatient Visits           4 months ago Toenail avulsion, initial encounter   [place] [facility] at [facility], Earley Abide, MD   4 months ago Extensor carpi ulnaris tendinitis   [place] [facility], Earley Abide, MD   7 months ago Essential hypertension   [place] and [facility] Medicine at [facility], Mickel Baas  H, MD   1 year ago Type II diabetes mellitus with complication [facility])   [place] [facility] at [facility], Jesse Sans, MD   2 years ago Type II diabetes mellitus with complication [facility])   [place] [facility] at [facility], Jesse Sans, MD       Future Appointments             In 2 months  Ralene Bathe, MD [place]            Signed Prescriptions Disp Refills   Insulin Pen Needle (UNIFINE PENTIPS) 31G X 6 MM MISC 100 each 0    Sig: Use as directed 3 times a day     Endocrinology: Diabetes - Testing Supplies Passed - 09/23/2022  9:46 AM      Passed - Valid encounter within last 12 months    Recent Outpatient Visits           4 months ago Toenail avulsion, initial encounter   [place] [facility] at [place], Earley Abide, MD   4 months ago Extensor carpi ulnaris tendinitis   [place] [facility] at [facility], Earley Abide, MD   7 months ago Essential hypertension   [place] [facility] at [facility], Jesse Sans, MD   1 year ago Type II diabetes mellitus with complication [facility])   [place] [facility] at [facility], Jesse Sans, MD   2 years ago Type II diabetes mellitus with complication [facility])   [place] [facility] at [facility], Jesse Sans, MD       Future Appointments             In 2 months Ralene Bathe, MD [place]             metFORMIN (GLUCOPHAGE) 1000 MG tablet 180 tablet 0    Sig: TAKE 1 TABLET BY MOUTH 2 TIMES DAILY     Endocrinology:  Diabetes - Biguanides Failed - 09/23/2022  9:46 AM      Failed - B12 Level in normal range and within 720 days    No results found for: "VITAMINB12"       Failed - CBC within normal limits and completed in the last 12 months    WBC  Date Value Ref Range Status  01/15/2022 6.5  Final  12/08/2019 7.3 4.0 - 10.5 K/uL Final   RBC  Date Value Ref Range Status  12/08/2019 3.94 3.87 - 5.11 MIL/uL Final   Hemoglobin  Date Value Ref Range Status  01/15/2022 11.3 (A) 12.0 - 16.0 Final  05/17/2019 11.3 11.1 - 15.9 g/dL Final   HCT  Date Value Ref Range Status  01/15/2022 34 (A) 36 - 46 Final    Hematocrit  Date Value Ref Range Status  05/17/2019 35.0 34.0 - 46.6 % Final   MCHC  Date Value Ref Range Status  12/08/2019 31.6 30.0 - 36.0 g/dL Final   [facility]  Date Value Ref Range Status  12/08/2019 28.2 26.0 - 34.0 pg Final   MCV  Date Value Ref Range Status  12/08/2019 89.1 80.0 - 100.0 fL Final  05/17/2019 86 79 - 97 fL Final  08/09/2013 84 80 - 100 fL Final   No results found for: "PLTCOUNTKUC", "LABPLAT", "POCPLA" RDW  Date  Value Ref Range Status  12/08/2019 14.9 11.5 - 15.5 % Final  05/17/2019 15.1 11.7 - 15.4 % Final  08/09/2013 15.3 (H) 11.5 - 14.5 % Final         Passed - Cr in normal range and within 360 days    Creatinine  Date Value Ref Range Status  08/11/2013 1.08 0.60 - 1.30 mg/dL Final   Creatinine, Ser  Date Value Ref Range Status  02/09/2022 0.77 0.44 - 1.00 mg/dL Final   Creatinine, Urine  Date Value Ref Range Status  12/07/2019 21 mg/dL Final    Comment:    Performed at [facility], [address], [place], [place] [address]         Passed - HBA1C is between 0 and 7.9 and within 180 days    Hemoglobin A1C  Date Value Ref Range Status  01/15/2022 6.1  Final   Hgb A1c MFr Bld  Date Value Ref Range Status  05/12/2022 5.6 4.8 - 5.6 % Final    Comment:    (NOTE) Pre diabetes:          5.7%-6.4%  Diabetes:              >6.4%  Glycemic control for   <7.0% adults with diabetes          Passed - eGFR in normal range and within 360 days    EGFR (African American)  Date Value Ref Range Status  08/11/2013 >60  Final   GFR calc Af Amer  Date Value Ref Range Status  10/01/2020 75 >59 mL/min/1.73 Final    Comment:    **In accordance with recommendations from the NKF-ASN Task force,**   [facility] is in the process of updating its eGFR calculation to the   2021 CKD-EPI creatinine equation that estimates kidney function   without a race variable.    EGFR (Non-African Amer.)  Date Value Ref Range Status  08/11/2013 55 (L)   Final    Comment:    eGFR values <87m/min/1.73 m2 may be an indication of chronic kidney disease (CKD). Calculated eGFR is useful in patients with stable renal function. The eGFR calculation will not be reliable in acutely ill patients when serum creatinine is changing rapidly. It is not useful in  patients on dialysis. The eGFR calculation may not be applicable to patients at the low and high extremes of body sizes, pregnant women, and vegetarians.    GFR, Estimated  Date Value Ref Range Status  02/09/2022 >60 >60 mL/min Final    Comment:    (NOTE) Calculated using the CKD-EPI Creatinine Equation (2021)    eGFR  Date Value Ref Range Status  01/15/2022 75  Final         Passed - Valid encounter within last 6 months    Recent Outpatient Visits           4 months ago Toenail avulsion, initial encounter   [place] [facility]   4 months ago Extensor carpi ulnaris tendinitis   [place] [facility]   7 months ago Essential hypertension   [place] [facility]   1 year ago Type II diabetes mellitus with complication ([facility]    [facility]   2 years ago Type  II diabetes mellitus with complication [facility])   [place] [facility] at [facility], Jesse Sans, MD       Future Appointments             In 2 months Ralene Bathe, MD [place]             rosuvastatin (CRESTOR) 5 MG tablet 90 tablet 3    Sig: TAKE 1 TABLET BY MOUTH DAILY.     Cardiovascular:  Antilipid - Statins 2 Failed - 09/23/2022  9:46 AM      Failed - Lipid Panel in normal range within the last 12 months    Cholesterol, Total  Date Value Ref Range Status  10/01/2020 178 100 - 199 mg/dL Final    Cholesterol  Date Value Ref Range Status  02/09/2022 168 0 - 200 mg/dL Final   LDL Chol Calc (NIH)  Date Value Ref Range Status  10/01/2020 89 0 - 99 mg/dL Final   LDL Cholesterol  Date Value Ref Range Status  02/09/2022 UNABLE TO CALCULATE IF TRIGLYCERIDE OVER 400 mg/dL 0 - 99 mg/dL Final    Comment:           Total Cholesterol/HDL:CHD Risk Coronary Heart Disease Risk Table                     Men   Women  1/2 Average Risk   3.4   3.3  Average Risk       5.0   4.4  2 X Average Risk   9.6   7.1  3 X Average Risk  23.4   11.0        Use the calculated Patient Ratio above and the CHD Risk Table to determine the patient's CHD Risk.        ATP III CLASSIFICATION (LDL):  <100     mg/dL   Optimal  100-129  mg/dL   Near or Above                    Optimal  130-159  mg/dL   Borderline  160-189  mg/dL   High  >190     mg/dL   Very High Performed at [facility], [address] [place], [place] [address]    Direct LDL  Date Value Ref Range Status  02/09/2022 90.2 0 - 99 mg/dL Final    Comment:    Performed at [facility] [address] [place], [place] [address]   HDL  Date Value Ref Range Status  02/09/2022 34 (L) >40 mg/dL Final  10/01/2020 29 (L) >39 mg/dL Final   Triglycerides  Date Value Ref Range Status  02/09/2022 484 (H) <150 mg/dL Final         Passed - Cr in normal range and within 360 days    Creatinine  Date Value Ref Range Status  08/11/2013 1.08 0.60 - 1.30 mg/dL Final   Creatinine, Ser  Date Value Ref Range Status  02/09/2022 0.77 0.44 - 1.00 mg/dL Final   Creatinine, Urine  Date Value Ref Range Status  12/07/2019 21 mg/dL Final    Comment:    Performed at [facility], [address], [place], [place] [address]         Passed - Patient is not pregnant      Passed - Valid encounter within last 12 months    Recent Outpatient Visits  4 months ago Toenail avulsion, initial encounter   [place]  [facility] at [place], Earley Abide, MD   4 months ago Extensor carpi ulnaris tendinitis   [place] [facility] at [place], Earley Abide, MD   7 months ago Essential hypertension   [place] [facility] at [facility], Jesse Sans, MD   1 year ago Type II diabetes mellitus with complication [facility])   [place] [facility] at [facility], Jesse Sans, MD   2 years ago Type II diabetes mellitus with complication [facility])   [place] [facility] at [facility], Jesse Sans, MD       Future Appointments             In 2 months Ralene Bathe, MD [place]

## 2022-09-24 ENCOUNTER — Other Ambulatory Visit: Payer: Self-pay | Admitting: Internal Medicine

## 2022-09-24 ENCOUNTER — Other Ambulatory Visit: Payer: Self-pay

## 2022-09-24 DIAGNOSIS — L03119 Cellulitis of unspecified part of limb: Secondary | ICD-10-CM

## 2022-09-24 MED ORDER — LEVOFLOXACIN 500 MG PO TABS: 500.0000 mg | ORAL_TABLET | Freq: Every day | ORAL | 0 refills | Status: AC

## 2022-10-05 ENCOUNTER — Other Ambulatory Visit: Payer: Self-pay

## 2022-10-05 DIAGNOSIS — E118 Type 2 diabetes mellitus with unspecified complications: Secondary | ICD-10-CM

## 2022-10-05 MED ORDER — GVOKE HYPOPEN 1-PACK 1 MG/0.2ML ~~LOC~~ SOAJ
1.0000 | Freq: Every day | SUBCUTANEOUS | 0 refills | Status: DC | PRN
  Filled 2022-10-05: qty 0.4, 30d supply, fill #0

## 2022-10-05 MED ORDER — FREESTYLE LITE TEST VI STRP
ORAL_STRIP | 0 refills | Status: DC
  Filled 2022-10-05: qty 100, 25d supply, fill #0

## 2022-10-06 ENCOUNTER — Other Ambulatory Visit: Payer: Self-pay

## 2022-10-07 ENCOUNTER — Ambulatory Visit: Payer: Medicare Other | Admitting: Dermatology

## 2022-10-09 ENCOUNTER — Other Ambulatory Visit: Payer: Self-pay | Admitting: Internal Medicine

## 2022-10-09 ENCOUNTER — Other Ambulatory Visit: Payer: Self-pay

## 2022-10-09 DIAGNOSIS — E118 Type 2 diabetes mellitus with unspecified complications: Secondary | ICD-10-CM

## 2022-10-09 MED ORDER — BLOOD GLUCOSE MONITOR KIT
PACK | 0 refills | Status: AC
  Filled 2022-10-09: qty 1, 1d supply, fill #0
  Filled 2022-10-09: qty 1, fill #0

## 2022-10-14 ENCOUNTER — Other Ambulatory Visit: Payer: Self-pay

## 2022-10-16 ENCOUNTER — Other Ambulatory Visit: Payer: Self-pay

## 2022-10-20 ENCOUNTER — Other Ambulatory Visit: Payer: Self-pay

## 2022-10-23 ENCOUNTER — Other Ambulatory Visit: Payer: Self-pay

## 2022-10-26 ENCOUNTER — Other Ambulatory Visit: Payer: Self-pay

## 2022-10-28 ENCOUNTER — Other Ambulatory Visit: Payer: Self-pay | Admitting: Internal Medicine

## 2022-10-28 ENCOUNTER — Other Ambulatory Visit: Payer: Self-pay

## 2022-10-28 DIAGNOSIS — E118 Type 2 diabetes mellitus with unspecified complications: Secondary | ICD-10-CM

## 2022-10-28 MED ORDER — INSULIN GLARGINE-YFGN 100 UNIT/ML ~~LOC~~ SOPN
PEN_INJECTOR | SUBCUTANEOUS | 3 refills | Status: DC
  Filled 2022-10-28: qty 30, 28d supply, fill #0
  Filled 2022-11-25: qty 30, 28d supply, fill #1
  Filled 2023-01-05: qty 30, 28d supply, fill #2
  Filled 2023-02-02: qty 30, 28d supply, fill #3

## 2022-10-29 ENCOUNTER — Other Ambulatory Visit: Payer: Self-pay

## 2022-11-04 ENCOUNTER — Other Ambulatory Visit: Payer: Self-pay

## 2022-11-04 DIAGNOSIS — T148XXA Other injury of unspecified body region, initial encounter: Secondary | ICD-10-CM

## 2022-11-04 DIAGNOSIS — R21 Rash and other nonspecific skin eruption: Secondary | ICD-10-CM

## 2022-11-04 MED ORDER — MUPIROCIN 2 % EX OINT: 1.0000 | TOPICAL_OINTMENT | Freq: Every day | CUTANEOUS | 3 refills | Status: DC

## 2022-11-04 MED ORDER — NYSTATIN 100000 UNIT/GM EX CREA: TOPICAL_CREAM | Freq: Two times a day (BID) | CUTANEOUS | 1 refills | Status: DC

## 2022-11-06 DIAGNOSIS — E119 Type 2 diabetes mellitus without complications: Secondary | ICD-10-CM | POA: Diagnosis not present

## 2022-11-06 DIAGNOSIS — H2513 Age-related nuclear cataract, bilateral: Secondary | ICD-10-CM | POA: Diagnosis not present

## 2022-11-06 LAB — HM DIABETES EYE EXAM

## 2022-11-11 ENCOUNTER — Other Ambulatory Visit: Payer: Self-pay | Admitting: Internal Medicine

## 2022-11-11 DIAGNOSIS — G4733 Obstructive sleep apnea (adult) (pediatric): Secondary | ICD-10-CM

## 2022-11-24 ENCOUNTER — Other Ambulatory Visit: Payer: Self-pay

## 2022-11-25 ENCOUNTER — Other Ambulatory Visit: Payer: Self-pay

## 2022-12-01 ENCOUNTER — Ambulatory Visit (INDEPENDENT_AMBULATORY_CARE_PROVIDER_SITE_OTHER): Payer: Commercial Managed Care - PPO | Admitting: Family Medicine

## 2022-12-01 ENCOUNTER — Ambulatory Visit
Admission: RE | Admit: 2022-12-01 | Discharge: 2022-12-01 | Disposition: A | Discharge status: Home / Self Care | Payer: Commercial Managed Care - PPO | Attending: Internal Medicine | Admitting: Internal Medicine

## 2022-12-01 ENCOUNTER — Encounter: Payer: Self-pay | Admitting: Family Medicine

## 2022-12-01 ENCOUNTER — Ambulatory Visit
Admission: RE | Admit: 2022-12-01 | Discharge: 2022-12-01 | Disposition: A | Discharge status: Home / Self Care | Payer: Commercial Managed Care - PPO | Source: Ambulatory Visit | Attending: Family Medicine | Admitting: Family Medicine

## 2022-12-01 ENCOUNTER — Other Ambulatory Visit: Payer: Self-pay

## 2022-12-01 VITALS — BP 136/64 | HR 72

## 2022-12-01 DIAGNOSIS — L03119 Cellulitis of unspecified part of limb: Secondary | ICD-10-CM | POA: Diagnosis not present

## 2022-12-01 DIAGNOSIS — M778 Other enthesopathies, not elsewhere classified: Secondary | ICD-10-CM | POA: Diagnosis not present

## 2022-12-01 DIAGNOSIS — M7989 Other specified soft tissue disorders: Secondary | ICD-10-CM | POA: Diagnosis not present

## 2022-12-01 DIAGNOSIS — M25531 Pain in right wrist: Secondary | ICD-10-CM

## 2022-12-01 NOTE — Patient Instructions (Signed)
-   Wear wrist brace during your wakeful hours x 2 weeks (okay to remove for bathing, sleeping, eating) - Apply Voltaren gel twice daily x 2 weeks then as needed - After 2 weeks, gradually wean and discontinue brace - After 2 weeks, start home exercises - If symptoms persist without improvement at the 2-week mark or beyond, contact our office for next steps

## 2022-12-01 NOTE — Assessment & Plan Note (Signed)
Acute on chronic concern with recurrence over the past several weeks, atraumatic.  Focality of symptoms to the ECU muscle/tendon complex, swelling at the ulnar wrist, no focal tenderness.  Given risk factors, acuity of symptoms, x-rays ordered, cock-up wrist brace provided, Rx sample provided diclofenac for twice daily usage x 2 weeks.  She can start home exercises 2 weeks if symptoms allow and wean from brace at that time as well.  If symptoms persist without improvement, coordinate follow-up for evaluation to consider corticosteroid injection, advanced imaging pending x-rays.

## 2022-12-01 NOTE — Progress Notes (Signed)
     Primary Care / Sports Medicine Office Visit  Patient Information:  Patient ID: Maria Singh, female DOB: 1951/05/05 Age: 73 y.o. MRN: [id number]   Maria Singh is a pleasant 72 y.o. female presenting with the following:  Chief Complaint  Patient presents with   Wrist Pain    Right, for a couple weeks, is swollen    Vitals:   12/01/22 1111  BP: 136/64  Pulse: 72  SpO2: 97%   There were no vitals filed for this visit. There is no height or weight on file to calculate BMI.  No results found.   Independent interpretation of notes and tests performed by another provider:   None  Procedures performed:   None  Pertinent History, Exam, Impression, and Recommendations:   [name] was seen today for wrist pain.  Extensor carpi ulnaris tendinitis Assessment & Plan: Acute on chronic concern with recurrence over the past several weeks, atraumatic.  Focality of symptoms to the ECU muscle/tendon complex, swelling at the ulnar wrist, no focal tenderness.  Given risk factors, acuity of symptoms, x-rays ordered, cock-up wrist brace provided, Rx sample provided diclofenac for twice daily usage x 2 weeks.  She can start home exercises 2 weeks if symptoms allow and wean from brace at that time as well.  If symptoms persist without improvement, coordinate follow-up for evaluation to consider corticosteroid injection, advanced imaging pending x-rays.      Orders & Medications No orders of the defined types were placed in this encounter.  No orders of the defined types were placed in this encounter.    Return if symptoms worsen or fail to improve.     Montel Culver, MD, [facility]   Primary Care Sports Medicine Primary Care and Sports Medicine at [facility]

## 2022-12-02 ENCOUNTER — Ambulatory Visit: Payer: Medicare Other | Admitting: Dermatology

## 2022-12-04 ENCOUNTER — Other Ambulatory Visit: Payer: Self-pay | Admitting: Internal Medicine

## 2022-12-04 DIAGNOSIS — L03115 Cellulitis of right lower limb: Secondary | ICD-10-CM

## 2022-12-04 MED ORDER — LEVOFLOXACIN 500 MG PO TABS: 500.0000 mg | ORAL_TABLET | Freq: Every day | ORAL | 0 refills | Status: AC

## 2022-12-09 ENCOUNTER — Telehealth: Payer: Self-pay | Admitting: Internal Medicine

## 2022-12-09 DIAGNOSIS — R0982 Postnasal drip: Secondary | ICD-10-CM | POA: Diagnosis not present

## 2022-12-09 DIAGNOSIS — H903 Sensorineural hearing loss, bilateral: Secondary | ICD-10-CM | POA: Diagnosis not present

## 2022-12-09 DIAGNOSIS — G4733 Obstructive sleep apnea (adult) (pediatric): Secondary | ICD-10-CM | POA: Diagnosis not present

## 2022-12-09 NOTE — Telephone Encounter (Signed)
Called and left message informed we don't have any records past 2016.   Maria Singh

## 2022-12-09 NOTE — Telephone Encounter (Signed)
Copied from [place] [id number]. Topic: General - Other >> Dec 09, 2022  2:41 PM Sabas Sous wrote: Reason for CRM: Josh from ENT, called requesting to speak to the clinic regarding the patient's sleep study records from 2010. `  Best contact: [phone] ext [phone]

## 2022-12-16 ENCOUNTER — Ambulatory Visit: Payer: Commercial Managed Care - PPO | Admitting: Dermatology

## 2022-12-16 VITALS — BP 141/67 | HR 79

## 2022-12-16 DIAGNOSIS — L03115 Cellulitis of right lower limb: Secondary | ICD-10-CM

## 2022-12-16 DIAGNOSIS — Z7189 Other specified counseling: Secondary | ICD-10-CM | POA: Diagnosis not present

## 2022-12-16 DIAGNOSIS — I890 Lymphedema, not elsewhere classified: Secondary | ICD-10-CM

## 2022-12-16 DIAGNOSIS — L98491 Non-pressure chronic ulcer of skin of other sites limited to breakdown of skin: Secondary | ICD-10-CM

## 2022-12-16 NOTE — Patient Instructions (Addendum)
Lymphedema Clinic 01/27/2023 at 1:00 in the [place] at [facility], Out patient Rehab   Due to recent changes in healthcare laws, you may see results of your pathology and/or laboratory studies on MyChart before the doctors have had a chance to review them. We understand that in some cases there may be results that are confusing or concerning to you. Please understand that not all results are received at the same time and often the doctors may need to interpret multiple results in order to provide you with the best plan of care or course of treatment. Therefore, we ask that you please give [place] 2 business days to thoroughly review all your results before contacting the office for clarification. Should we see a critical lab result, you will be contacted sooner.   If You Need Anything After Your Visit  If you have any questions or concerns for your doctor, please call our main line at [phone] and press option 4 to reach your doctor's medical assistant. If no one answers, please leave a voicemail as directed and we will return your call as soon as possible. Messages left after 4 pm will be answered the following business day.   You may also send [place] a message via [place]. We typically respond to MyChart messages within 1-2 business days.  For prescription refills, please ask your pharmacy to contact our office. Our fax number is [phone].  If you have an urgent issue when the clinic is closed that cannot wait until the next business day, you can page your doctor at the number below.    Please note that while we do our best to be available for urgent issues outside of office hours, we are not available 24/7.   If you have an urgent issue and are unable to reach [place], you may choose to seek medical care at your doctor's office, retail clinic, urgent care center, or emergency room.  If you have a medical emergency, please immediately call 911 or go to the emergency department.  Pager Numbers  -  Dr. Nehemiah Massed: [phone]  - Dr. Laurence Ferrari: [phone]  - Dr. Nicole Kindred: [phone]  In the event of inclement weather, please call our main line at [phone] for an update on the status of any delays or closures.  Dermatology Medication Tips: Please keep the boxes that topical medications come in in order to help keep track of the instructions about where and how to use these. Pharmacies typically print the medication instructions only on the boxes and not directly on the medication tubes.   If your medication is too expensive, please contact our office at [phone] option 4 or send [place] a message through [place].   We are unable to tell what your co-pay for medications will be in advance as this is different depending on your insurance coverage. However, we may be able to find a substitute medication at lower cost or fill out paperwork to get insurance to cover a needed medication.   If a prior authorization is required to get your medication covered by your insurance company, please allow [place] 1-2 business days to complete this process.  Drug prices often vary depending on where the prescription is filled and some pharmacies may offer cheaper prices.  The website www.goodrx.com contains coupons for medications through different pharmacies. The prices here do not account for what the cost may be with help from insurance (it may be cheaper with your insurance), but the website can give you the price if you did  not use any insurance.  - You can print the associated coupon and take it with your prescription to the pharmacy.  - You may also stop by our office during regular business hours and pick up a GoodRx coupon card.  - If you need your prescription sent electronically to a different pharmacy, notify our office through [facility] or by phone at [phone] option 4.     Si Usted Necesita Algo Despus de Su Visita  Tambin puede enviarnos un mensaje a travs de Pharmacist, community. Por  lo general respondemos a los mensajes de MyChart en el transcurso de 1 a 2 das hbiles.  Para renovar recetas, por favor pida a su farmacia que se ponga en contacto con nuestra oficina. Harland Dingwall de fax es [place] [phone].  Si tiene un asunto urgente cuando la clnica est cerrada y que no puede esperar hasta el siguiente da hbil, puede llamar/localizar a su doctor(a) al nmero que aparece a continuacin.   Por favor, tenga en cuenta que aunque hacemos todo lo posible para estar disponibles para asuntos urgentes fuera del horario de [place], no estamos disponibles las 24 horas del da, los 7 das de la [place].   Si tiene un problema urgente y no puede comunicarse con nosotros, puede optar por buscar atencin mdica  en el consultorio de su doctor(a), en una clnica privada, en un centro de atencin urgente o en una sala de emergencias.  Si tiene Engineering geologist, por favor llame inmediatamente al 911 o vaya a la sala de emergencias.  Nmeros de bper  - Dr. Nehemiah Massed: [phone]  - Dra. Moye: [phone]  - Dra. Nicole Kindred: [phone]  En caso de inclemencias del [place], por favor llame a Johnsie Kindred principal al [phone] para una actualizacin sobre el [place] de cualquier retraso o cierre.  Consejos para la medicacin en dermatologa: Por favor, guarde las cajas en las que vienen los medicamentos de uso tpico para ayudarle a seguir las instrucciones sobre dnde y cmo usarlos. Las farmacias generalmente imprimen las instrucciones del medicamento slo en las cajas y no directamente en los tubos del [place].   Si su medicamento es muy caro, por favor, pngase en contacto con Zigmund Daniel llamando al [phone] y presione la opcin 4 o envenos un mensaje a travs de Pharmacist, community.   No podemos decirle cul ser su copago por los medicamentos por adelantado ya que esto es diferente dependiendo de la cobertura de su seguro. Sin embargo, es posible que podamos encontrar  un medicamento sustituto a Electrical engineer un formulario para que el seguro cubra el medicamento que se considera necesario.   Si se requiere una autorizacin previa para que su compaa de seguros [place] su medicamento, por favor permtanos de 1 a 2 das hbiles para completar este proceso.  Los precios de los medicamentos varan con frecuencia dependiendo del Environmental consultant de dnde se surte la receta y alguna farmacias pueden ofrecer precios ms baratos.  El sitio web www.goodrx.com tiene cupones para medicamentos de Airline pilot. Los precios aqu no tienen en cuenta lo que podra costar con la ayuda del seguro (puede ser ms barato con su seguro), pero el sitio web puede darle el precio si no utiliz Research scientist (physical sciences).  - Puede imprimir el cupn correspondiente y llevarlo con su receta a la farmacia.  - Tambin puede pasar por nuestra oficina durante el horario de atencin regular y Charity fundraiser una tarjeta de cupones de GoodRx.  - Si necesita que su receta se enve  electrnicamente a [place] diferente, informe a nuestra oficina a travs de MyChart de [place] o por telfono llamando al [phone] y presione la opcin 4.

## 2022-12-16 NOTE — Progress Notes (Signed)
   Follow-Up Visit   Subjective  Maria Singh is a 72 y.o. female who presents for the following: sore (R medial foot, ), Cellulitis (On R medial foot where bx site is, pt currently on Levaquin), and Lymphedema (Bil lower legs, pt was referred to [facility] clinic). Patient accompanied by wife.  The following portions of the chart were reviewed this encounter and updated as appropriate:   Tobacco  Allergies  Meds  Problems  Med Hx  Surg Hx  Fam Hx     Review of Systems:  No other skin or systemic complaints except as noted in HPI or Assessment and Plan.  Objective  Well appearing patient in no apparent distress; mood and affect are within normal limits.  A focused examination was performed including bil lower legs, feet. Relevant physical exam findings are noted in the Assessment and Plan.  bil legs Edema lower legs  Right Foot - Anterior Crust R medial foot   Assessment & Plan  Lymphedema bil legs Patient referral sent to Occupational Therapy/Lymphedema clinic 07/06/22.  After multiple calls to Occupational Therapy clinic concerning pts referral, we were able to get patient an appointment for 01/27/23 at 1:00pm in the Lymphedema clinic. Patient and wife advised of appointment and location.  Non-pressure chronic ulcer of skin of other sites limited to breakdown of skin ([place]) Right Foot - Anterior With recent diagnosis of Cellulitis, Cellulitis resolved Finish Levaquin as prescribed Start Bactroban oint qd until healed  Wound cleansed with Puracyn, Mupirocin oint applied followed by telfa and cotton wrap.  Return if symptoms worsen or fail to improve.  I, Othelia Pulling, RMA, am acting as scribe for Sarina Ser, MD . Documentation: I have reviewed the above documentation for accuracy and completeness, and I agree with the above.  Sarina Ser, MD

## 2022-12-18 ENCOUNTER — Other Ambulatory Visit: Payer: Self-pay

## 2022-12-18 ENCOUNTER — Telehealth: Payer: Self-pay | Admitting: Internal Medicine

## 2022-12-18 ENCOUNTER — Other Ambulatory Visit: Payer: Self-pay | Admitting: Internal Medicine

## 2022-12-18 DIAGNOSIS — E782 Mixed hyperlipidemia: Secondary | ICD-10-CM

## 2022-12-18 DIAGNOSIS — E1165 Type 2 diabetes mellitus with hyperglycemia: Secondary | ICD-10-CM

## 2022-12-18 DIAGNOSIS — E109 Type 1 diabetes mellitus without complications: Secondary | ICD-10-CM

## 2022-12-18 MED FILL — Metformin HCl Tab 1000 MG: ORAL | 90 days supply | Qty: 180 | Fill #0 | Status: AC

## 2022-12-18 MED FILL — Icosapent Ethyl Cap 1 GM: ORAL | 90 days supply | Qty: 360 | Fill #0 | Status: AC

## 2022-12-18 MED FILL — Hydrochlorothiazide Tab 25 MG: ORAL | 90 days supply | Qty: 90 | Fill #0 | Status: AC

## 2022-12-18 MED FILL — Insulin Pen Needle 31 G X 6 MM (1/4" or 15/64"): 30 days supply | Qty: 100 | Fill #0 | Status: AC

## 2022-12-18 MED FILL — Glucose Blood Test Strip: 25 days supply | Qty: 100 | Fill #0 | Status: AC

## 2022-12-18 MED FILL — Ibuprofen Tab 800 MG: ORAL | 34 days supply | Qty: 100 | Fill #0 | Status: AC

## 2022-12-18 MED FILL — Rosuvastatin Calcium Tab 5 MG: ORAL | 90 days supply | Qty: 90 | Fill #0 | Status: AC

## 2022-12-18 NOTE — Telephone Encounter (Signed)
Contacted Venesa Shanon Payor to schedule their annual wellness visit. Appointment made for 01/20/2023.  Sherol Dade; Care Guide Ambulatory [facility] [place] [facility] Direct Dial: [phone]

## 2022-12-19 ENCOUNTER — Other Ambulatory Visit: Payer: Self-pay

## 2022-12-20 ENCOUNTER — Other Ambulatory Visit: Payer: Self-pay

## 2022-12-21 ENCOUNTER — Other Ambulatory Visit: Payer: Self-pay

## 2022-12-23 ENCOUNTER — Encounter: Payer: Self-pay | Admitting: Dermatology

## 2022-12-23 DIAGNOSIS — M778 Other enthesopathies, not elsewhere classified: Secondary | ICD-10-CM | POA: Diagnosis not present

## 2022-12-29 ENCOUNTER — Other Ambulatory Visit: Payer: Self-pay

## 2022-12-29 ENCOUNTER — Other Ambulatory Visit: Payer: Self-pay | Admitting: Internal Medicine

## 2022-12-29 DIAGNOSIS — R21 Rash and other nonspecific skin eruption: Secondary | ICD-10-CM

## 2022-12-29 DIAGNOSIS — E118 Type 2 diabetes mellitus with unspecified complications: Secondary | ICD-10-CM

## 2022-12-29 MED ORDER — FREESTYLE LIBRE 3 SENSOR MISC
1.0000 | 3 refills | Status: DC
  Filled 2022-12-29: qty 6, 84d supply, fill #0
  Filled 2023-03-21: qty 6, 84d supply, fill #1
  Filled 2023-06-21: qty 6, 84d supply, fill #2
  Filled 2023-09-15: qty 6, 84d supply, fill #3

## 2022-12-29 MED ORDER — FLUCONAZOLE 200 MG PO TABS: 200.0000 mg | ORAL_TABLET | Freq: Every day | ORAL | 1 refills | Status: DC

## 2022-12-29 MED ORDER — NYSTATIN 100000 UNIT/GM EX CREA: TOPICAL_CREAM | Freq: Two times a day (BID) | CUTANEOUS | 1 refills | Status: DC

## 2022-12-30 DIAGNOSIS — N182 Chronic kidney disease, stage 2 (mild): Secondary | ICD-10-CM | POA: Diagnosis not present

## 2022-12-30 DIAGNOSIS — I10 Essential (primary) hypertension: Secondary | ICD-10-CM | POA: Diagnosis not present

## 2022-12-30 DIAGNOSIS — E875 Hyperkalemia: Secondary | ICD-10-CM | POA: Diagnosis not present

## 2022-12-30 DIAGNOSIS — E8722 Chronic metabolic acidosis: Secondary | ICD-10-CM | POA: Diagnosis not present

## 2022-12-30 DIAGNOSIS — E1122 Type 2 diabetes mellitus with diabetic chronic kidney disease: Secondary | ICD-10-CM | POA: Diagnosis not present

## 2023-01-01 DIAGNOSIS — G4730 Sleep apnea, unspecified: Secondary | ICD-10-CM | POA: Diagnosis not present

## 2023-01-20 ENCOUNTER — Inpatient Hospital Stay: Payer: Commercial Managed Care - PPO | Attending: Oncology | Admitting: Oncology

## 2023-01-20 ENCOUNTER — Encounter: Payer: Self-pay | Admitting: Oncology

## 2023-01-20 ENCOUNTER — Inpatient Hospital Stay: Payer: Commercial Managed Care - PPO

## 2023-01-20 VITALS — BP 159/61 | HR 78 | Temp 97.1°F | Resp 18

## 2023-01-20 DIAGNOSIS — D649 Anemia, unspecified: Secondary | ICD-10-CM

## 2023-01-20 DIAGNOSIS — D509 Iron deficiency anemia, unspecified: Secondary | ICD-10-CM | POA: Diagnosis not present

## 2023-01-20 DIAGNOSIS — N182 Chronic kidney disease, stage 2 (mild): Secondary | ICD-10-CM | POA: Diagnosis not present

## 2023-01-20 DIAGNOSIS — E1122 Type 2 diabetes mellitus with diabetic chronic kidney disease: Secondary | ICD-10-CM | POA: Insufficient documentation

## 2023-01-20 DIAGNOSIS — I129 Hypertensive chronic kidney disease with stage 1 through stage 4 chronic kidney disease, or unspecified chronic kidney disease: Secondary | ICD-10-CM

## 2023-01-20 DIAGNOSIS — E538 Deficiency of other specified B group vitamins: Secondary | ICD-10-CM

## 2023-01-20 DIAGNOSIS — D508 Other iron deficiency anemias: Secondary | ICD-10-CM

## 2023-01-20 LAB — IRON AND TIBC
Iron: 35 ug/dL (ref 28–170)
Saturation Ratios: 9 % — ABNORMAL LOW (ref 10.4–31.8)
TIBC: 405 ug/dL (ref 250–450)
UIBC: 370 ug/dL

## 2023-01-20 LAB — CBC WITH DIFFERENTIAL/PLATELET
Abs Immature Granulocytes: 0.05 10*3/uL (ref 0.00–0.07)
Basophils Absolute: 0 10*3/uL (ref 0.0–0.1)
Basophils Relative: 1 %
Eosinophils Absolute: 0.5 10*3/uL (ref 0.0–0.5)
Eosinophils Relative: 7 %
HCT: 33.4 % — ABNORMAL LOW (ref 36.0–46.0)
Hemoglobin: 10.3 g/dL — ABNORMAL LOW (ref 12.0–15.0)
Immature Granulocytes: 1 %
Lymphocytes Relative: 17 %
Lymphs Abs: 1.2 10*3/uL (ref 0.7–4.0)
MCH: 26 pg (ref 26.0–34.0)
MCHC: 30.8 g/dL (ref 30.0–36.0)
MCV: 84.3 fL (ref 80.0–100.0)
Monocytes Absolute: 0.5 10*3/uL (ref 0.1–1.0)
Monocytes Relative: 7 %
Neutro Abs: 4.8 10*3/uL (ref 1.7–7.7)
Neutrophils Relative %: 67 %
Platelets: 224 10*3/uL (ref 150–400)
RBC: 3.96 MIL/uL (ref 3.87–5.11)
RDW: 16.5 % — ABNORMAL HIGH (ref 11.5–15.5)
WBC: 7.1 10*3/uL (ref 4.0–10.5)
nRBC: 0 % (ref 0.0–0.2)

## 2023-01-20 LAB — FOLATE: Folate: 10 ng/mL (ref >5.9)

## 2023-01-20 LAB — COMPREHENSIVE METABOLIC PANEL
ALT: 22 U/L (ref 0–44)
AST: 26 U/L (ref 15–41)
Albumin: 3.8 g/dL (ref 3.5–5.0)
Alkaline Phosphatase: 26 U/L — ABNORMAL LOW (ref 38–126)
Anion gap: 11 (ref 5–15)
BUN: 39 mg/dL — ABNORMAL HIGH (ref 8–23)
CO2: 23 mmol/L (ref 22–32)
Calcium: 9.3 mg/dL (ref 8.9–10.3)
Chloride: 105 mmol/L (ref 98–111)
Creatinine, Ser: 1.14 mg/dL — ABNORMAL HIGH (ref 0.44–1.00)
GFR, Estimated: 52 mL/min — ABNORMAL LOW (ref >60)
Glucose, Bld: 119 mg/dL — ABNORMAL HIGH (ref 70–99)
Potassium: 4.9 mmol/L (ref 3.5–5.1)
Sodium: 139 mmol/L (ref 135–145)
Total Bilirubin: 0.3 mg/dL (ref 0.3–1.2)
Total Protein: 7.4 g/dL (ref 6.5–8.1)

## 2023-01-20 LAB — LACTATE DEHYDROGENASE: LDH: 140 U/L (ref 98–192)

## 2023-01-20 LAB — FERRITIN: Ferritin: 18 ng/mL (ref 11–307)

## 2023-01-20 LAB — TSH: TSH: 6.322 u[IU]/mL — ABNORMAL HIGH (ref 0.350–4.500)

## 2023-01-20 LAB — RETIC PANEL
Immature Retic Fract: 19 % — ABNORMAL HIGH (ref 2.3–15.9)
RBC.: 3.99 MIL/uL (ref 3.87–5.11)
Retic Count, Absolute: 79 10*3/uL (ref 19.0–186.0)
Retic Ct Pct: 2 % (ref 0.4–3.1)
Reticulocyte Hemoglobin: 27.7 pg — ABNORMAL LOW (ref >27.9)

## 2023-01-20 LAB — VITAMIN B12: Vitamin B-12: 145 pg/mL — ABNORMAL LOW (ref 180–914)

## 2023-01-20 NOTE — Assessment & Plan Note (Addendum)
Recommend anemia work up, check cbc, cmp, retic panel, iron tibc ferritin, folate, myeloma panel, LDH, haptoglobin TSh  Today's blood work showed Hb 10.4, decreased iron saturation 9, decreased retic Hb.  consistent with IDA I discussed about option IV venofer.  I discussed about the potential risks including but not limited to allergic reactions/infusion reactions including anaphylactic reactions, phlebitis, high blood pressure, wheezing, SOB, skin rash, weight gain,dark urine, leg swelling, back pain, headache, nausea and fatigue, etc. Patient agrees with IV venofer treatments.  Plan IV venofer weekly x 4  Rest of the blood work results are pending at the time of dictation.

## 2023-01-20 NOTE — Assessment & Plan Note (Signed)
Encourage oral hydration and avoid nephrotoxins.   

## 2023-01-20 NOTE — Progress Notes (Unsigned)
Hematology/Oncology Consult note [phone] [phone]     REFERRING PROVIDER: Anthonette Legato, MD  CHIEF COMPLAINTS/REASON FOR VISIT:  Anemia  ASSESSMENT & PLAN:   Iron deficiency anemia Recommend anemia work up, check cbc, cmp, retic panel, iron tibc ferritin, folate, myeloma panel, LDH, haptoglobin TSh  Today's blood work showed Hb 10.4, decreased iron saturation 9, decreased retic Hb.  consistent with IDA I discussed about option IV venofer.  I discussed about the potential risks including but not limited to allergic reactions/infusion reactions including anaphylactic reactions, phlebitis, high blood pressure, wheezing, SOB, skin rash, weight gain,dark urine, leg swelling, back pain, headache, nausea and fatigue, etc. Patient agrees with IV venofer treatments.  Plan IV venofer weekly x 4  Rest of the blood work results are pending at the time of dictation.   CKD (chronic kidney disease) stage 2, GFR 60-89 ml/min Encourage oral hydration and avoid nephrotoxins.   Orders Placed This Encounter  Procedures   Ferritin    Standing Status:   Future    Number of Occurrences:   1    Standing Expiration Date:   07/22/2023   Iron and TIBC    Standing Status:   Future    Number of Occurrences:   1    Standing Expiration Date:   01/20/2024   Folate    Standing Status:   Future    Number of Occurrences:   1    Standing Expiration Date:   01/20/2024   Vitamin B12    Standing Status:   Future    Number of Occurrences:   1    Standing Expiration Date:   01/20/2024   Retic Panel    Standing Status:   Future    Number of Occurrences:   1    Standing Expiration Date:   01/20/2024   CBC with Differential/Platelet    Standing Status:   Future    Number of Occurrences:   1    Standing Expiration Date:   01/20/2024   Comprehensive metabolic panel    Standing Status:   Future    Number of Occurrences:   1    Standing Expiration Date:   01/20/2024   Multiple Myeloma  Panel (SPEP&IFE w/QIG)    Standing Status:   Future    Number of Occurrences:   1    Standing Expiration Date:   01/20/2024   Kappa/lambda light chains    Standing Status:   Future    Number of Occurrences:   1    Standing Expiration Date:   01/20/2024   Lactate dehydrogenase    Standing Status:   Future    Number of Occurrences:   1    Standing Expiration Date:   01/20/2024   Haptoglobin    Standing Status:   Future    Number of Occurrences:   1    Standing Expiration Date:   01/20/2024   TSH    Standing Status:   Future    Number of Occurrences:   1    Standing Expiration Date:   01/20/2024   Follow up TBD  All questions were answered. The patient knows to call the clinic with any problems, questions or concerns.  Earlie Server, MD, PhD [facility] Hematology Oncology 01/20/2023     HISTORY OF PRESENTING ILLNESS:  Maria Singh is a  72 y.o.  female with PMH listed below who was referred to me for anemia Reviewed patient's recent labs that was done.  She  was found to have abnormal CBC on 12/30/22 with Hb 9.8, mcv 80.4, normal wbc and platelet.  Reviewed patient's previous labs ordered by primary care physician's office, anemia is chronic onset , duration is since at least 2016, baseline level was in 11s in 2023.  She takes oral iron supplementation, recently off.  Mentioned occasional constipation, stating "It's not that bad, but once in a while." She had not noticed any recent bleeding such as epistaxis, hematuria or hematochezia.  Patient has a history of Stage 2 chronic kidney disease with normal creatinine levels, managed by Dr. Holley Raring. Patient reports variable energy levels and is "basically in the wheelchair." Charcot Marie Tooth muscular atrophy  - No chest pain, shortness of breath, or blood in stool reported. Mentioned occasional constipation, stating "It's not that bad, but once in a while.". she is not able to get colonoscopy due to not able to tolerate the prep.  She has  taken annual Fecal immunochemical tests     MEDICAL HISTORY:  Past Medical History:  Diagnosis Date   Anemia    Charcot Marie Tooth muscular atrophy    Chronic kidney disease    Depression    Diabetes mellitus without complication    GERD (gastroesophageal reflux disease)    Hyperlipidemia    Hypertension    Rhabdomyolysis 12/06/2019   Rotator cuff injury Nov. 2012   left arm   Sepsis 07/16/2018   Sepsis 07/16/2018   Sleep apnea     SURGICAL HISTORY: Past Surgical History:  Procedure Laterality Date   arm surgery Left    broken arm   FRACTURE SURGERY  1998 ?   Left arm   HERNIA REPAIR      SOCIAL HISTORY: Social History   Socioeconomic History   Marital status: Married    Spouse name: Not on file   Number of children: Not on file   Years of education: Not on file   Highest education level: Not on file  Occupational History   Not on file  Tobacco Use   Smoking status: Never   Smokeless tobacco: Never  Vaping Use   Vaping Use: Never used  Substance and Sexual Activity   Alcohol use: Not Currently    Alcohol/week: 0.0 standard drinks of alcohol    Comment: very very rare   Drug use: Never   Sexual activity: Not Currently    Birth control/protection: None  Other Topics Concern   Not on file  Social History Narrative   Not on file   Social Determinants of Health   Financial Resource Strain: Low Risk  (01/07/2022)   Overall Financial Resource Strain (CARDIA)    Difficulty of Paying Living Expenses: Not hard at all  Food Insecurity: No Food Insecurity (01/07/2022)   Hunger Vital Sign    Worried About Running Out of Food in the Last Year: Never true    [place] in the Last Year: Never true  Transportation Needs: No Transportation Needs (01/07/2022)   PRAPARE - Hydrologist (Medical): No    Lack of Transportation (Non-Medical): No  Physical Activity: Inactive (01/07/2022)   Exercise Vital Sign    Days of Exercise per Week:  0 days    Minutes of Exercise per Session: 0 min  Stress: No Stress Concern Present (01/07/2022)   [place]    Feeling of Stress : Not at all  Social Connections: Moderately Integrated (01/07/2022)   Social  Connection and Isolation Panel [NHANES]    Frequency of Communication with Friends and Family: More than three times a week    Frequency of Social Gatherings with Friends and Family: Once a week    Attends Religious Services: More than 4 times per year    Active Member of [employer] or Organizations: No    Attends Archivist Meetings: Never    Marital Status: Married  Human resources officer Violence: Not At Risk (01/07/2022)   Humiliation, Afraid, Rape, and Kick questionnaire    Fear of Current or Ex-Partner: No    Emotionally Abused: No    Physically Abused: No    Sexually Abused: No    FAMILY HISTORY: Family History  Problem Relation Age of Onset   Cancer Mother    Heart disease Father    Diabetes Maternal Grandmother    Hypertension Maternal Grandmother    Obesity Maternal Grandmother    Heart disease Paternal Grandfather    Breast cancer Maternal Aunt        mat great aunt    ALLERGIES:  is allergic to metronidazole and related and sulfa antibiotics.  MEDICATIONS:  Current Outpatient Medications  Medication Sig Dispense Refill   Accu-Chek FastClix Lancets MISC TEST ONCE DAILY 102 each 2   acetaminophen (TYLENOL) 500 MG tablet Take 500 mg by mouth every 6 (six) hours as needed.     amLODipine (NORVASC) 5 MG tablet TAKE 1 TABLET BY MOUTH DAILY. 90 tablet 3   aspirin 81 MG tablet Take 81 mg by mouth daily.     blood glucose meter kit and supplies KIT Dispense based on patient and insurance preference. Use up to four times daily as directed. 1 each 0   Cholecalciferol (VITAMIN D) 50 MCG (2000 UT) tablet Take 2,000 Units by mouth daily.     clotrimazole-betamethasone (LOTRISONE) cream Apply 1 application  topically 2 (two) times daily. (Patient taking differently: Apply 1 application  topically as needed.) 30 g 1   Continuous Blood Gluc Sensor (FREESTYLE LIBRE 3 SENSOR) MISC 1 each by Does not apply route every 14 (fourteen) days. Use to check glucose continuously 6 each 3   diclofenac Sodium (VOLTAREN) 1 % GEL Apply 2 g topically 4 (four) times daily. To affected joint. 100 g 1   docusate sodium (COLACE) 100 MG capsule Take 100 mg by mouth daily.      Famotidine (PEPCID PO) Take by mouth daily. Dr. Lindwood Coke     ferrous sulfate 325 (65 FE) MG tablet Take 1 tablet (325 mg total) by mouth daily with breakfast. 90 tablet 1   fluconazole (DIFLUCAN) 200 MG tablet Take 1 tablet (200 mg total) by mouth daily. 3 tablet 1   Glucagon (GVOKE HYPOPEN 1-PACK) 1 MG/0.2ML SOAJ Inject 1 each into the skin daily as needed. 0.4 mL 0   glucose blood (FREESTYLE LITE) test strip Use up to four times daily as directed 100 each 0   hydrochlorothiazide (HYDRODIURIL) 25 MG tablet Take 1 tablet (25 mg total) by mouth daily. 90 tablet 1   ibuprofen (ADVIL) 800 MG tablet Take 1 tablet (800 mg total) by mouth every 8 (eight) hours as needed. 100 tablet 1   icosapent Ethyl (VASCEPA) 1 g capsule Take 2 capsules (2 g total) by mouth 2 (two) times daily. 360 capsule 0   insulin glargine-yfgn (SEMGLEE, YFGN,) 100 UNIT/ML Pen Inject 60 Units into the skin every morning AND 50 Units every evening. 30 mL 3   Insulin Pen Needle (UNIFINE  PENTIPS) 31G X 6 MM MISC Use as directed 3 times a day 100 each 0   loratadine (CLARITIN) 10 MG tablet Take 10 mg by mouth daily.     metFORMIN (GLUCOPHAGE) 1000 MG tablet Take 1 tablet (1,000 mg total) by mouth 2 (two) times daily. 180 tablet 0   mupirocin ointment (BACTROBAN) 2 % Apply daily to open sores on legs/feet until resolved 22 g 3   NON FORMULARY Auto CPap nightly     nystatin cream (MYCOSTATIN) APPLY TO THE AFFECTED AREA(S) TWO TIMES DAILY 30 g 1   ondansetron (ZOFRAN) 4 MG tablet Take 1-2  tablets (4-8 mg total) by mouth every 8 (eight) hours as needed for nausea or vomiting. 40 tablet 1   pantoprazole (PROTONIX) 40 MG tablet Take 1 tablet (40 mg total) by mouth daily. 90 tablet 3   patiromer (VELTASSA) 8.4 g packet Take 1 packet (8.4 g) by mouth one time each day 30 each 11   rosuvastatin (CRESTOR) 5 MG tablet Take 1 tablet (5 mg total) by mouth daily. 90 tablet 0   Semaglutide,0.25 or 0.5MG /DOS, (OZEMPIC, 0.25 OR 0.5 MG/DOSE,) 2 MG/3ML SOPN Inject 1 mg into the skin once a week. 9 mL 3   sertraline (ZOLOFT) 100 MG tablet TAKE 1 TABLET BY MOUTH DAILY. 90 tablet 3   sodium bicarbonate 650 MG tablet Take 1 tablet by mouth in the morning and at bedtime.     sodium bicarbonate 650 MG tablet TAKE 2 TABLETS (1,300 MG) BY MOUTH 2 TIMES A DAY 120 tablet 11   triamcinolone cream (KENALOG) 0.1 % Apply 1 Application topically daily as needed. Qd up to 5 days a week top of feet until clear, avoid face, groin, axilla 80 g 0   HUMALOG KWIKPEN 100 UNIT/ML KwikPen INJECT 22 UNITS TOTAL INTO THE SKIN 3 THREE TIMES DAILY. (Patient not taking: Reported on 01/20/2023) 60 mL 87   No current facility-administered medications for this visit.    Review of Systems  Constitutional:  Positive for fatigue. Negative for appetite change, chills, fever and unexpected weight change.  HENT:   Negative for hearing loss and voice change.   Eyes:  Negative for eye problems.  Respiratory:  Negative for chest tightness, cough and shortness of breath.   Cardiovascular:  Negative for chest pain.  Gastrointestinal:  Negative for abdominal distention, abdominal pain, blood in stool and constipation.       Occasional constipation  Genitourinary:  Negative for difficulty urinating.   Musculoskeletal:  Positive for arthralgias.  Skin:  Negative for itching and rash.  Neurological:  Positive for extremity weakness.  Hematological:  Negative for adenopathy.  Psychiatric/Behavioral:  Negative for confusion.     PHYSICAL  EXAMINATION: Vitals:   01/20/23 1514  BP: (!) 159/61  Pulse: 78  Resp: 18  Temp: (!) 97.1 F (36.2 C)  SpO2: 96%   There were no vitals filed for this visit.  Physical Exam Constitutional:      General: She is not in acute distress.    Appearance: She is obese.     Comments: Patient sits in the wheelchair  HENT:     Head: Normocephalic and atraumatic.  Eyes:     General: No scleral icterus. Cardiovascular:     Rate and Rhythm: Normal rate and regular rhythm.     Heart sounds: Normal heart sounds.  Pulmonary:     Effort: Pulmonary effort is normal. No respiratory distress.     Breath sounds: No wheezing.  Abdominal:     General: Bowel sounds are normal. There is no distension.     Palpations: Abdomen is soft.  Musculoskeletal:        General: No deformity.     Cervical back: Normal range of motion and neck supple.  Lymphadenopathy:     Cervical: No cervical adenopathy.  Skin:    General: Skin is warm and dry.  Neurological:     Mental Status: She is alert and oriented to person, place, and time. Mental status is at baseline.     Cranial Nerves: No cranial nerve deficit.  Psychiatric:        Mood and Affect: Mood normal.      LABORATORY DATA:  I have reviewed the data as listed    Latest Ref Rng & Units 01/20/2023    4:04 PM 01/15/2022   12:00 AM 12/08/2019    6:10 AM  CBC  WBC 4.0 - 10.5 K/uL 7.1  6.5     7.3   Hemoglobin 12.0 - 15.0 g/dL 47.8  29.5     62.1   Hematocrit 36.0 - 46.0 % 33.4  34     35.1   Platelets 150 - 400 K/uL 224  189     175      This result is from an external source.      Latest Ref Rng & Units 01/20/2023    4:04 PM 02/09/2022    2:16 PM 01/15/2022   12:00 AM  CMP  Glucose 70 - 99 mg/dL 308  72    BUN 8 - 23 mg/dL 39  36  40      Creatinine 0.44 - 1.00 mg/dL 6.57  8.46  0.8      Sodium 135 - 145 mmol/L 139  137  144      Potassium 3.5 - 5.1 mmol/L 4.9  5.1  4.9      Chloride 98 - 111 mmol/L 105  107  111      CO2 22 - 32 mmol/L 23   23  17       Calcium 8.9 - 10.3 mg/dL 9.3  9.5  9.8      Total Protein 6.5 - 8.1 g/dL 7.4  7.5    Total Bilirubin 0.3 - 1.2 mg/dL 0.3  0.2    Alkaline Phos 38 - 126 U/L 26  25    AST 15 - 41 U/L 26  28    ALT 0 - 44 U/L 22  31       This result is from an external source.       Lab Results  Component Value Date   FERRITIN 18 01/20/2023   IRONPCTSAT 9 (L) 01/20/2023   TIBC 405 01/20/2023   IRON 35 01/20/2023      RADIOGRAPHIC STUDIES: I have personally reviewed the radiological images as listed and agreed with the findings in the report. No results found.

## 2023-01-21 LAB — KAPPA/LAMBDA LIGHT CHAINS
Kappa free light chain: 64 mg/L — ABNORMAL HIGH (ref 3.3–19.4)
Kappa, lambda light chain ratio: 1.54 (ref 0.26–1.65)
Lambda free light chains: 41.5 mg/L — ABNORMAL HIGH (ref 5.7–26.3)

## 2023-01-21 LAB — HAPTOGLOBIN: Haptoglobin: 182 mg/dL (ref 37–355)

## 2023-01-22 ENCOUNTER — Other Ambulatory Visit: Payer: Self-pay

## 2023-01-22 DIAGNOSIS — T148XXA Other injury of unspecified body region, initial encounter: Secondary | ICD-10-CM

## 2023-01-22 DIAGNOSIS — R21 Rash and other nonspecific skin eruption: Secondary | ICD-10-CM

## 2023-01-22 MED ORDER — MUPIROCIN 2 % EX OINT
1.0000 | TOPICAL_OINTMENT | Freq: Every day | CUTANEOUS | 3 refills | Status: DC
Start: 2023-01-22 — End: 2023-03-17
  Filled 2023-01-22: qty 22, 15d supply, fill #0
  Filled 2023-02-24: qty 22, 15d supply, fill #1

## 2023-01-22 MED ORDER — NYSTATIN 100000 UNIT/GM EX CREA
1.0000 | TOPICAL_CREAM | Freq: Two times a day (BID) | CUTANEOUS | 1 refills | Status: DC
Start: 2023-01-22 — End: 2023-03-17
  Filled 2023-01-22: qty 30, 15d supply, fill #0
  Filled 2023-02-24: qty 30, 15d supply, fill #1

## 2023-01-23 NOTE — Progress Notes (Deleted)
Hematology/Oncology Consult note [phone] [phone]      Patient Care Team: Reubin MilanBerglund, Laura H, MD as PCP - General (Internal Medicine) Mady HaagensenLateef, Munsoor, MD (Nephrology)   REFERRING PROVIDER: Mady HaagensenLateef, Munsoor, MD  CHIEF COMPLAINTS/REASON FOR VISIT:  Anemia  ASSESSMENT & PLAN:  Iron deficiency anemia Recommend anemia work up, check cbc, cmp, retic panel, iron tibc ferritin, folate, myeloma panel, LDH, haptoglobin TSh  Today's blood work showed Hb 10.4, decreased iron saturation 9, decreased retic Hb.  consistent with IDA I discussed about option IV venofer.  I discussed about the potential risks including but not limited to allergic reactions/infusion reactions including anaphylactic reactions, phlebitis, high blood pressure, wheezing, SOB, skin rash, weight gain,dark urine, leg swelling, back pain, headache, nausea and fatigue, etc. Patient agrees with IV venofer treatments.  Plan IV venofer weekly x 4  Rest of the blood work results are pending at the time of dictation.   CKD (chronic kidney disease) stage 2, GFR 60-89 ml/min Encourage oral hydration and avoid nephrotoxins.   Orders Placed This Encounter  Procedures   Ferritin    Standing Status:   Future    Number of Occurrences:   1    Standing Expiration Date:   07/22/2023   Iron and TIBC    Standing Status:   Future    Number of Occurrences:   1    Standing Expiration Date:   01/20/2024   Folate    Standing Status:   Future    Number of Occurrences:   1    Standing Expiration Date:   01/20/2024   Vitamin B12    Standing Status:   Future    Number of Occurrences:   1    Standing Expiration Date:   01/20/2024   Retic Panel    Standing Status:   Future    Number of Occurrences:   1    Standing Expiration Date:   01/20/2024   CBC with Differential/Platelet    Standing Status:   Future    Number of Occurrences:   1    Standing Expiration Date:   01/20/2024   Comprehensive metabolic panel     Standing Status:   Future    Number of Occurrences:   1    Standing Expiration Date:   01/20/2024   Multiple Myeloma Panel (SPEP&IFE w/QIG)    Standing Status:   Future    Number of Occurrences:   1    Standing Expiration Date:   01/20/2024   Kappa/lambda light chains    Standing Status:   Future    Number of Occurrences:   1    Standing Expiration Date:   01/20/2024   Lactate dehydrogenase    Standing Status:   Future    Number of Occurrences:   1    Standing Expiration Date:   01/20/2024   Haptoglobin    Standing Status:   Future    Number of Occurrences:   1    Standing Expiration Date:   01/20/2024   TSH    Standing Status:   Future    Number of Occurrences:   1    Standing Expiration Date:   01/20/2024    All questions were answered. The patient knows to call the clinic with any problems, questions or concerns.  Rickard PatienceZhou Fred Hammes, MD, PhD [facility] 01/20/2023     HISTORY OF PRESENTING ILLNESS:  Maria Singh is a  72 y.o.  female with PMH listed below  who was referred to me for anemia Reviewed patient's recent labs that was done.  She was found to have abnormal CBC on  Reviewed patient's previous labs ordered by primary care physician's office, anemia is chronic onset , duration is since  No aggravating or improving factors.  She denies recent chest pain on exertion, shortness of breath on minimal exertion, pre-syncopal episodes, or palpitations She had not noticed any recent bleeding such as epistaxis, hematuria or hematochezia.  She denies over the counter NSAID ingestion. She is not *** on antiplatelets agents. Her last colonoscopy was  She denies any pica and eats a variety of diet.    MEDICAL HISTORY:  Past Medical History:  Diagnosis Date   Anemia    Charcot Marie Tooth muscular atrophy    Chronic kidney disease    Depression    Diabetes mellitus without complication    GERD (gastroesophageal reflux disease)    Hyperlipidemia    Hypertension     Rhabdomyolysis 12/06/2019   Rotator cuff injury Nov. 2012   left arm   Sepsis 07/16/2018   Sepsis 07/16/2018   Sleep apnea     SURGICAL HISTORY: Past Surgical History:  Procedure Laterality Date   arm surgery Left    broken arm   FRACTURE SURGERY  1998 ?   Left arm   HERNIA REPAIR      SOCIAL HISTORY: Social History   Socioeconomic History   Marital status: Married    Spouse name: Not on file   Number of children: Not on file   Years of education: Not on file   Highest education level: Not on file  Occupational History   Not on file  Tobacco Use   Smoking status: Never   Smokeless tobacco: Never  Vaping Use   Vaping Use: Never used  Substance and Sexual Activity   Alcohol use: Not Currently    Alcohol/week: 0.0 standard drinks of alcohol    Comment: very very rare   Drug use: Never   Sexual activity: Not Currently    Birth control/protection: None  Other Topics Concern   Not on file  Social History Narrative   Not on file   Social Determinants of Health   Financial Resource Strain: Low Risk  (01/07/2022)   Overall Financial Resource Strain (CARDIA)    Difficulty of Paying Living Expenses: Not hard at all  Food Insecurity: No Food Insecurity (01/07/2022)   Hunger Vital Sign    Worried About Running Out of Food in the Last Year: Never true    Ran Out of Food in the Last Year: Never true  Transportation Needs: No Transportation Needs (01/07/2022)   PRAPARE - Administrator, Civil Service (Medical): No    Lack of Transportation (Non-Medical): No  Physical Activity: Inactive (01/07/2022)   Exercise Vital Sign    Days of Exercise per Week: 0 days    Minutes of Exercise per Session: 0 min  Stress: No Stress Concern Present (01/07/2022)   [employer] of Occupational Health - Occupational Stress Questionnaire    Feeling of Stress : Not at all  Social Connections: Moderately Integrated (01/07/2022)   Social Connection and Isolation Panel [NHANES]     Frequency of Communication with Friends and Family: More than three times a week    Frequency of Social Gatherings with Friends and Family: Once a week    Attends Religious Services: More than 4 times per year    Active Member of Clubs or Organizations: No  Attends Banker Meetings: Never    Marital Status: Married  Catering manager Violence: Not At Risk (01/07/2022)   Humiliation, Afraid, Rape, and Kick questionnaire    Fear of Current or Ex-Partner: No    Emotionally Abused: No    Physically Abused: No    Sexually Abused: No    FAMILY HISTORY: Family History  Problem Relation Age of Onset   Cancer Mother    Heart disease Father    Diabetes Maternal Grandmother    Hypertension Maternal Grandmother    Obesity Maternal Grandmother    Heart disease Paternal Grandfather    Breast cancer Maternal Aunt        mat great aunt    ALLERGIES:  is allergic to metronidazole and related and sulfa antibiotics.  MEDICATIONS:  Current Outpatient Medications  Medication Sig Dispense Refill   Accu-Chek FastClix Lancets MISC TEST ONCE DAILY 102 each 2   acetaminophen (TYLENOL) 500 MG tablet Take 500 mg by mouth every 6 (six) hours as needed.     amLODipine (NORVASC) 5 MG tablet TAKE 1 TABLET BY MOUTH DAILY. 90 tablet 3   aspirin 81 MG tablet Take 81 mg by mouth daily.     blood glucose meter kit and supplies KIT Dispense based on patient and insurance preference. Use up to four times daily as directed. 1 each 0   Cholecalciferol (VITAMIN D) 50 MCG (2000 UT) tablet Take 2,000 Units by mouth daily.     clotrimazole-betamethasone (LOTRISONE) cream Apply 1 application topically 2 (two) times daily. (Patient taking differently: Apply 1 application  topically as needed.) 30 g 1   Continuous Blood Gluc Sensor (FREESTYLE LIBRE 3 SENSOR) MISC 1 each by Does not apply route every 14 (fourteen) days. Use to check glucose continuously 6 each 3   diclofenac Sodium (VOLTAREN) 1 % GEL Apply 2 g  topically 4 (four) times daily. To affected joint. 100 g 1   docusate sodium (COLACE) 100 MG capsule Take 100 mg by mouth daily.      Famotidine (PEPCID PO) Take by mouth daily. Dr. Conception Oms     ferrous sulfate 325 (65 FE) MG tablet Take 1 tablet (325 mg total) by mouth daily with breakfast. 90 tablet 1   fluconazole (DIFLUCAN) 200 MG tablet Take 1 tablet (200 mg total) by mouth daily. 3 tablet 1   Glucagon (GVOKE HYPOPEN 1-PACK) 1 MG/0.2ML SOAJ Inject 1 each into the skin daily as needed. 0.4 mL 0   glucose blood (FREESTYLE LITE) test strip Use up to four times daily as directed 100 each 0   hydrochlorothiazide (HYDRODIURIL) 25 MG tablet Take 1 tablet (25 mg total) by mouth daily. 90 tablet 1   ibuprofen (ADVIL) 800 MG tablet Take 1 tablet (800 mg total) by mouth every 8 (eight) hours as needed. 100 tablet 1   icosapent Ethyl (VASCEPA) 1 g capsule Take 2 capsules (2 g total) by mouth 2 (two) times daily. 360 capsule 0   insulin glargine-yfgn (SEMGLEE, YFGN,) 100 UNIT/ML Pen Inject 60 Units into the skin every morning AND 50 Units every evening. 30 mL 3   Insulin Pen Needle (UNIFINE PENTIPS) 31G X 6 MM MISC Use as directed 3 times a day 100 each 0   loratadine (CLARITIN) 10 MG tablet Take 10 mg by mouth daily.     metFORMIN (GLUCOPHAGE) 1000 MG tablet Take 1 tablet (1,000 mg total) by mouth 2 (two) times daily. 180 tablet 0   NON FORMULARY Auto CPap nightly  ondansetron (ZOFRAN) 4 MG tablet Take 1-2 tablets (4-8 mg total) by mouth every 8 (eight) hours as needed for nausea or vomiting. 40 tablet 1   pantoprazole (PROTONIX) 40 MG tablet Take 1 tablet (40 mg total) by mouth daily. 90 tablet 3   patiromer (VELTASSA) 8.4 g packet Take 1 packet (8.4 g) by mouth one time each day 30 each 11   rosuvastatin (CRESTOR) 5 MG tablet Take 1 tablet (5 mg total) by mouth daily. 90 tablet 0   Semaglutide,0.25 or 0.5MG /DOS, (OZEMPIC, 0.25 OR 0.5 MG/DOSE,) 2 MG/3ML SOPN Inject 1 mg into the skin once a week. 9 mL  3   sertraline (ZOLOFT) 100 MG tablet TAKE 1 TABLET BY MOUTH DAILY. 90 tablet 3   sodium bicarbonate 650 MG tablet Take 1 tablet by mouth in the morning and at bedtime.     sodium bicarbonate 650 MG tablet TAKE 2 TABLETS (1,300 MG) BY MOUTH 2 TIMES A DAY 120 tablet 11   triamcinolone cream (KENALOG) 0.1 % Apply 1 Application topically daily as needed. Qd up to 5 days a week top of feet until clear, avoid face, groin, axilla 80 g 0   HUMALOG KWIKPEN 100 UNIT/ML KwikPen INJECT 22 UNITS TOTAL INTO THE SKIN 3 THREE TIMES DAILY. (Patient not taking: Reported on 01/20/2023) 60 mL 87   mupirocin ointment (BACTROBAN) 2 % Apply daily to open sores on legs/feet until resolved 22 g 3   nystatin cream (MYCOSTATIN) Apply 1 Application topically 2 (two) times daily. 30 g 1   No current facility-administered medications for this visit.    Review of Systems - Oncology  PHYSICAL EXAMINATION: Vitals:   01/20/23 1514  BP: (!) 159/61  Pulse: 78  Resp: 18  Temp: (!) 97.1 F (36.2 C)  SpO2: 96%   There were no vitals filed for this visit.  Physical Exam   LABORATORY DATA:  I have reviewed the data as listed    Latest Ref Rng & Units 01/20/2023    4:04 PM 01/15/2022   12:00 AM 12/08/2019    6:10 AM  CBC  WBC 4.0 - 10.5 K/uL 7.1  6.5     7.3   Hemoglobin 12.0 - 15.0 g/dL 11.910.3  14.711.3     82.911.1   Hematocrit 36.0 - 46.0 % 33.4  34     35.1   Platelets 150 - 400 K/uL 224  189     175      This result is from an external source.      Latest Ref Rng & Units 01/20/2023    4:04 PM 02/09/2022    2:16 PM 01/15/2022   12:00 AM  CMP  Glucose 70 - 99 mg/dL 562119  72    BUN 8 - 23 mg/dL 39  36  40      Creatinine 0.44 - 1.00 mg/dL 1.301.14  8.650.77  0.8      Sodium 135 - 145 mmol/L 139  137  144      Potassium 3.5 - 5.1 mmol/L 4.9  5.1  4.9      Chloride 98 - 111 mmol/L 105  107  111      CO2 22 - 32 mmol/L 23  23  17       Calcium 8.9 - 10.3 mg/dL 9.3  9.5  9.8      Total Protein 6.5 - 8.1 g/dL 7.4  7.5    Total  Bilirubin 0.3 - 1.2 mg/dL 0.3  0.2  Alkaline Phos 38 - 126 U/L 26  25    AST 15 - 41 U/L 26  28    ALT 0 - 44 U/L 22  31       This result is from an external source.   Lab Results  Component Value Date   IRONPCTSAT 9 (L) 01/20/2023   IRONPCTSAT 12 (L) 12/13/2018   IRONPCTSAT 5 (LL) 07/26/2018    Lab Results  Component Value Date   FERRITIN 18 01/20/2023   FERRITIN 45 12/13/2018   FERRITIN 12 (L) 07/26/2018     RADIOGRAPHIC STUDIES: I have personally reviewed the radiological images as listed and agreed with the findings in the report. No results found.

## 2023-01-24 ENCOUNTER — Encounter: Payer: Self-pay | Admitting: Oncology

## 2023-01-24 DIAGNOSIS — E538 Deficiency of other specified B group vitamins: Secondary | ICD-10-CM | POA: Insufficient documentation

## 2023-01-24 NOTE — Addendum Note (Signed)
Addended by: Rickard Patience on: 01/24/2023 08:00 PM   Modules accepted: Orders

## 2023-01-24 NOTE — Assessment & Plan Note (Signed)
Recommend Vitamin B12 supplementation.  

## 2023-01-25 ENCOUNTER — Telehealth: Payer: Self-pay

## 2023-01-25 LAB — MULTIPLE MYELOMA PANEL, SERUM
Albumin SerPl Elph-Mcnc: 3.5 g/dL (ref 2.9–4.4)
Albumin/Glob SerPl: 1.2 (ref 0.7–1.7)
Alpha 1: 0.2 g/dL (ref 0.0–0.4)
Alpha2 Glob SerPl Elph-Mcnc: 0.8 g/dL (ref 0.4–1.0)
B-Globulin SerPl Elph-Mcnc: 1 g/dL (ref 0.7–1.3)
Gamma Glob SerPl Elph-Mcnc: 1.1 g/dL (ref 0.4–1.8)
Globulin, Total: 3.1 g/dL (ref 2.2–3.9)
IgA: 152 mg/dL (ref 87–352)
IgG (Immunoglobin G), Serum: 1118 mg/dL (ref 586–1602)
IgM (Immunoglobulin M), Srm: 134 mg/dL (ref 26–217)
Total Protein ELP: 6.6 g/dL (ref 6.0–8.5)

## 2023-01-25 NOTE — Telephone Encounter (Signed)
-----   Message from Rickard Patience, MD sent at 01/24/2023  7:56 PM EDT ----- Please let patient know that her blood work showed low iron level, I recommend IV venofer weekly x 4. Please arrange if she agrees.  Also her B12 level is low, please ask if she prefers oral B12 or B12 injections. Let me know.  There is one blood result is still pending.  Follow up in 3 months, labs prior to MD +/- Venofer, +/- B12 injection.

## 2023-01-25 NOTE — Telephone Encounter (Signed)
Called pt and no answer, left message with CB#. Mychart message also sent.

## 2023-01-26 ENCOUNTER — Other Ambulatory Visit: Payer: Self-pay

## 2023-01-26 ENCOUNTER — Encounter: Payer: Self-pay | Admitting: Oncology

## 2023-01-26 MED ORDER — VITAMIN B-12 1000 MCG PO TABS
1000.0000 ug | ORAL_TABLET | Freq: Every day | ORAL | 0 refills | Status: AC
  Filled 2023-01-26 – 2023-02-09 (×3): qty 90, 90d supply, fill #0

## 2023-01-26 NOTE — Telephone Encounter (Signed)
Please schedule patient and call her with appts. She prefers Wednesday afternoon's for appts.   IV venofer weekly x4 (first dose is NEW) Labs in 3 months  MD/ venofer/ B12 inj  1-2 days after labs.

## 2023-01-27 ENCOUNTER — Other Ambulatory Visit: Payer: Self-pay

## 2023-01-27 ENCOUNTER — Encounter: Payer: Medicare Other | Admitting: Occupational Therapy

## 2023-02-02 ENCOUNTER — Other Ambulatory Visit: Payer: Self-pay

## 2023-02-03 ENCOUNTER — Other Ambulatory Visit: Payer: Self-pay

## 2023-02-03 ENCOUNTER — Inpatient Hospital Stay: Payer: Commercial Managed Care - PPO

## 2023-02-03 VITALS — BP 150/69 | HR 70 | Temp 97.3°F | Resp 18

## 2023-02-03 DIAGNOSIS — I129 Hypertensive chronic kidney disease with stage 1 through stage 4 chronic kidney disease, or unspecified chronic kidney disease: Secondary | ICD-10-CM | POA: Diagnosis not present

## 2023-02-03 DIAGNOSIS — E1122 Type 2 diabetes mellitus with diabetic chronic kidney disease: Secondary | ICD-10-CM | POA: Diagnosis not present

## 2023-02-03 DIAGNOSIS — E538 Deficiency of other specified B group vitamins: Secondary | ICD-10-CM | POA: Diagnosis not present

## 2023-02-03 DIAGNOSIS — N182 Chronic kidney disease, stage 2 (mild): Secondary | ICD-10-CM | POA: Diagnosis not present

## 2023-02-03 DIAGNOSIS — D509 Iron deficiency anemia, unspecified: Secondary | ICD-10-CM | POA: Diagnosis not present

## 2023-02-03 DIAGNOSIS — D508 Other iron deficiency anemias: Secondary | ICD-10-CM

## 2023-02-03 MED ORDER — SODIUM CHLORIDE 0.9 % IV SOLN
200.0000 mg | Freq: Once | INTRAVENOUS | Status: AC
  Administered 2023-02-03: 200 mg via INTRAVENOUS
  Filled 2023-02-03: qty 200

## 2023-02-03 MED ORDER — SODIUM CHLORIDE 0.9 % IV SOLN
Freq: Once | INTRAVENOUS | Status: AC
  Filled 2023-02-03: qty 250

## 2023-02-09 DIAGNOSIS — G4733 Obstructive sleep apnea (adult) (pediatric): Secondary | ICD-10-CM | POA: Diagnosis not present

## 2023-02-10 ENCOUNTER — Encounter: Payer: Self-pay | Admitting: Oncology

## 2023-02-10 ENCOUNTER — Other Ambulatory Visit: Payer: Self-pay

## 2023-02-10 ENCOUNTER — Inpatient Hospital Stay: Payer: Commercial Managed Care - PPO

## 2023-02-10 VITALS — BP 142/60 | HR 71 | Temp 97.4°F | Resp 18

## 2023-02-10 DIAGNOSIS — N182 Chronic kidney disease, stage 2 (mild): Secondary | ICD-10-CM | POA: Diagnosis not present

## 2023-02-10 DIAGNOSIS — D509 Iron deficiency anemia, unspecified: Secondary | ICD-10-CM | POA: Diagnosis not present

## 2023-02-10 DIAGNOSIS — E1122 Type 2 diabetes mellitus with diabetic chronic kidney disease: Secondary | ICD-10-CM | POA: Diagnosis not present

## 2023-02-10 DIAGNOSIS — I129 Hypertensive chronic kidney disease with stage 1 through stage 4 chronic kidney disease, or unspecified chronic kidney disease: Secondary | ICD-10-CM | POA: Diagnosis not present

## 2023-02-10 DIAGNOSIS — E538 Deficiency of other specified B group vitamins: Secondary | ICD-10-CM | POA: Diagnosis not present

## 2023-02-10 DIAGNOSIS — D508 Other iron deficiency anemias: Secondary | ICD-10-CM

## 2023-02-10 MED ORDER — SODIUM CHLORIDE 0.9 % IV SOLN
200.0000 mg | Freq: Once | INTRAVENOUS | Status: AC
  Administered 2023-02-10: 200 mg via INTRAVENOUS
  Filled 2023-02-10: qty 200

## 2023-02-10 MED ORDER — SODIUM CHLORIDE 0.9 % IV SOLN
Freq: Once | INTRAVENOUS | Status: AC
  Filled 2023-02-10: qty 250

## 2023-02-17 ENCOUNTER — Inpatient Hospital Stay: Payer: Commercial Managed Care - PPO | Attending: Oncology

## 2023-02-17 VITALS — BP 127/60 | HR 78 | Temp 97.4°F | Resp 18

## 2023-02-17 DIAGNOSIS — N182 Chronic kidney disease, stage 2 (mild): Secondary | ICD-10-CM

## 2023-02-17 DIAGNOSIS — D509 Iron deficiency anemia, unspecified: Secondary | ICD-10-CM | POA: Insufficient documentation

## 2023-02-17 DIAGNOSIS — D508 Other iron deficiency anemias: Secondary | ICD-10-CM

## 2023-02-17 MED ORDER — SODIUM CHLORIDE 0.9 % IV SOLN
200.0000 mg | Freq: Once | INTRAVENOUS | Status: AC
  Administered 2023-02-17: 200 mg via INTRAVENOUS
  Filled 2023-02-17: qty 200

## 2023-02-17 MED ORDER — SODIUM CHLORIDE 0.9 % IV SOLN
Freq: Once | INTRAVENOUS | Status: AC
  Filled 2023-02-17: qty 250

## 2023-02-17 NOTE — Progress Notes (Signed)
Patient declined to wait the 30 minutes for post iron infusion observation today.  Post iron education done. Patient verbalized understanding. 

## 2023-02-17 NOTE — Patient Instructions (Signed)

## 2023-02-24 ENCOUNTER — Other Ambulatory Visit: Payer: Self-pay | Admitting: Internal Medicine

## 2023-02-24 ENCOUNTER — Ambulatory Visit: Payer: Commercial Managed Care - PPO | Attending: Dermatology | Admitting: Occupational Therapy

## 2023-02-24 DIAGNOSIS — I890 Lymphedema, not elsewhere classified: Secondary | ICD-10-CM

## 2023-02-24 NOTE — Therapy (Signed)
OUTPATIENT OCCUPATIONAL THERAPY EVALUATION  LOWER EXTREMITY LYMPHEDEMA  Patient Name: Bridgete Arrazola MRN: [id number] DOB:April 06, 1951, 72 y.o., female Today's Date: 02/26/2023  END OF SESSION:   OT End of Session - 02/26/23 0940     Visit Number 1    Number of Visits 36    Date for OT Re-Evaluation 05/25/23    OT Start Time 0100    OT Stop Time 0220    OT Time Calculation (min) 80 min    Activity Tolerance Patient tolerated treatment well;No increased pain    Behavior During Therapy [facility] for tasks assessed/performed              Past Medical History:  Diagnosis Date   Anemia    Charcot Marie Tooth muscular atrophy    Chronic kidney disease    Depression    Diabetes mellitus without complication (HCC)    GERD (gastroesophageal reflux disease)    Hyperlipidemia    Hypertension    Rhabdomyolysis 12/06/2019   Rotator cuff injury Nov. 2012   left arm   Sepsis (HCC) 07/16/2018   Sepsis (HCC) 07/16/2018   Sleep apnea    Past Surgical History:  Procedure Laterality Date   arm surgery Left    broken arm   FRACTURE SURGERY  1998 ?   Left arm   HERNIA REPAIR     Patient Active Problem List   Diagnosis Date Noted   B12 deficiency 01/24/2023   CKD (chronic kidney disease) stage 2, GFR 60-89 ml/min 01/20/2023   Toenail avulsion, initial encounter 05/25/2022   Extensor carpi ulnaris tendinitis 05/12/2022   Trigger middle finger of right hand 05/12/2022   Bilateral leg edema 04/22/2022   Renal tubular acidosis, 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   Hyperkalemia 12/06/2019   Morbid obesity with BMI of 50.0-59.9, adult (HCC) 12/06/2019   Ambulatory dysfunction 12/06/2019   Type II diabetes mellitus with complication (HCC) 12/13/2018   Iron deficiency anemia 12/13/2018   OSA on CPAP 11/25/2018   LPRD (laryngopharyngeal reflux disease) 04/13/2018   Hyperlipidemia associated with type 2 diabetes mellitus (HCC) 08/10/2017   Essential hypertension 08/10/2017   Charcot Marie Tooth muscular  atrophy 10/30/2016    PCP: Reubin Milan , MD  REFERRING PROVIDER: Deirdre Evener , MD  REFERRING DIAG: I89.0  THERAPY DIAG:  Lymphedema  Rationale for Evaluation and Treatment: Rehabilitation  ONSET DATE: 02/24/23  SUBJECTIVE:                                                                                                                                                                                           SUBJECTIVE STATEMENT:  PERTINENT  HISTORY: Relevant to Lymphedema : Charcot Marie Tooth muscular atrophy, CKD, Obesity ( BMI 50-59.9, OSA (has CPAP), Depression. Takes NORVASC  PAIN:  Are you having pain? Yes: NPRS scale: does not rate numerically/10 Pain location: BLE Pain description: heaviness, discomfort,  Aggravating factors: dependent positioning Relieving factors: elevation, repositioning  PRECAUTIONS: Fall and Other: LYMPHEDEMA precautions, increased infection risk  WEIGHT BEARING RESTRICTIONS:  Pt requires Max A to stand  and complete transfers. Unable to walk w device  FALLS:  Has patient fallen in last 6 months? 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, friends  HAND DOMINANCE: right   PRIOR LEVEL OF FUNCTION: Requires assistive device for independence, Needs assistance with ADLs, Needs assistance with homemaking, Needs assistance with transfers, and    PATIENT GOALS: get swelling down and keep it down   OBJECTIVE:  OBSERVATIONS / OTHER ASSESSMENTS:   Mild-Moderate, Stage  II, Bilateral Lower Extremity Lymphedema 2/2 Muscle weakness and Obesity Significant contributing factors include dependent positioning and fluid overload    2/2 CKD  Skin  Description Hyper-Keratosis Peau' de Orange Shiny Tight Fibrotic/ Indurated Fatty Doughy Spongy/ boggy     x x R>L      Skin dry Flaky WNL Macerated    mildly      Color Redness Varicosities Blanching Hemosiderin Stain Mottled   x x x  ? x   Odor Malodorous Yeast Fungal infection  WNL      x   Temperature Warm Cool wnl    x     Pitting Edema   1+ 2+ 3+ 4+ Non-pitting         On dorsal foot   Girth Symmetrical Asymmetrical                   Distribution     toes to groin    Stemmer Sign Positive Negative   +    Lymphorrhea History Of:  Present Absent     x    Wounds History Of Present Absent Venous Arterial Non-Pressure Sheer     x        Signs of Infection History of Cellulitis Warmth Erythema Acute Swelling Drainage Borders   X 3                 Sensation Light Touch Deep pressure Hypersensitivity   In tact Impaired In tact Impaired present absent   x  x  x     Nails WNL   Fungus nail dystrophy     x   Hair Growth Symmetrical Asymmetrical       Skin Creases Base of toes  Ankles   Base of Fingers knees       Abdominal pannus Thigh Lobules  Face/neck   x x          BLE COMPARATIVE LIMB VOLUMETRICS:   Initial TBA at OT Rx visit 1  LANDMARK RIGHT   R LEG (A-D) N/A  R THIGH (E-G) ml  R FULL LIMB (A-G) ml  Limb Volume differential (LVD)  %  Volume change since initial %  Volume change overall V  (Blank rows = not tested)  LANDMARK LEFT    L LEG (A-D) N/A  L THIGH (E-G) ml  L FULL LIMB (A-G) ml  Limb Volume differential (LVD)  %  Volume change since initial %  Volume change overall %  (  Blank rows = not tested)   COGNITION: Overall cognitive status: Within functional limits for tasks assessed   POSTURE: Difficult to assess seated in power wc. Head forward, leaning to L (suspect scoliosis w pelvic obliquity)  LE ROM: NT 2/2 CMT  LE STRENGTH: impaired  INFECTIONS: denies cellulitis. Medical record notes sepsis x 3. Pt reports cellulitis x 3  FOTO functional outcome measure: Intake: 26%  Lymphedema Life Impact Scale (LLIS): Intake: 58.82% (The percentage that lymphedema related  problems  impact  your life in the last week)  TODAY'S TREATMENT:                                                                                                                                         Occupational Therapy Evaluation  PATIENT EDUCATION:  Education details: Provided basic level education regarding lymphatic structure and function, etiology, onset patterns, stages of progression, and prevention to limit infection risk, worsening condition and further functional decline. Pt edu for aught interaction between blood circulatory system and lymphatic circulation.Discussed  impact of gravity and co-morbidities on lymphatic function. Outlined Complete Decongestive Therapy (CDT)  as standard of care and provided in depth information regarding 4 primary components of Intensive and Self Management Phases, including Manual Lymph Drainage (MLD), compression wrapping and garments, skin care, and therapeutic exercise. Homero Fellers discussion with re need for frequent attendance and high burden of care when caregiver is needed, impact of co morbidities. We discussed  the chronic, progressive nature of lymphedema and Importance of daily, ongoing LE self-care essential for limiting progression and infection risk.   Pr and family edu  for medical necessity of power wc and seating (power TIS, ELR, RECLINE) in this case for optimal functional mobility, postural support, pressure relief and lymphedema self-management over time to limit infection risk and progression.  Person educated: Patient and Spouse Education method: Explanation, Demonstration, Tactile cues, Verbal cues, and Handouts Education comprehension: verbalized understanding, returned demonstration, and needs further education  HOME EXERCISE PROGRAM: 1.Therapeutic lymphatic pumping therex- 2 sets of 10 reps, hold 5 seconds; elements in order. 2 x daily PRN 2. Compression: During Intensive Phase CDT: multilayer compression bandaging using gradient  techniques      During self management phase: consider custom Elvarex  SOFT knee high ccl 1 or 2, 18-21 b or 23-32 mmHg) or custom CircAid, Velcro wrap style leggings- knee length garment alternatives-CG friendly) 3. Daily skin care to sustain optimal hydration. Wash bites, scratches, blisters, etc with mild soap and water , apply antibacterial first aide cream and a Band-Aid. 4. Simple self-Manual lymphatic drainage (MLD) PRN.   Custom-made gradient compression garments and HOS devices are medically necessary to fit the exact dimensions of the affected extremities, and to provide gradient compression essential for optimally managing this patient's symptoms of chronic, progressive lymphedema. Multiple custom compression garments are needed for optimal hygiene to limit infection risk. Custom compression garments should  be replaced q 3-6 months When worn consistently for optimal lipo-lymphedema self-management over time.  ASSESSMENT:  CLINICAL IMPRESSION: Nadalyn Newlin Izquierdo presents with mild-moderate, stage II, BLE lymphedema 2/2 Charcot Marie Foot muscle atrophy and obesity (BMI  50-59.9). Significant contributing factors include fluid overload 2/2 CKD and full time dependent positioning. Pt is at increased risk of infection and non-pressure related wounds due to prior history. BLE lymphedema is a chronic, progressive condition with no cure. It limits functional performance in all occupational domains, including Basic and Instrumental ADLs, Productive activities and Leisure Pursuits, social participation and body image. Ms. Schmuck will benefit from Intensive and Self-management Phase Complete Decongestive Therapy (CDT) for optimal control of this condition, but before commencing this regime, Pt will benefit from power seating and mobility.  A power wheelchair with power seating, including power tilt-in-space, recline and elevating leg rests, is medically necessary to fit uniquely sized and shaped lower  extremities, to provide accurate and consistent postural support, to provide pressure relief to enlarged body parts, and to enable modified independent lower extremity elevation with an open hip angle for optimal gravity assisted lymphedema self management over time at home.  Once power chair and seating system are fitted, OT will commence CDT to single limb at a time, which will include compression wrapping, MLD, skin care and lymphatic pumping therex. The goals of Intensive Phase CDT is to reduce limb swelling, infection risk and limit the progression of lymphedema. Without skilled OT for lymphedema care the disease will progress and further functional decline is expected.    OBJECTIVE IMPAIRMENTS: decreased activity tolerance, decreased balance, decreased knowledge of condition, decreased knowledge of use of DME, decreased mobility, difficulty walking, decreased ROM, decreased strength, increased edema, impaired flexibility, impaired UE functional use, postural dysfunction, obesity, pain, and chronic leg swelling and associated pain .   ACTIVITY LIMITATIONS: functional ambulation and mobility, including reaching, carrying, lifting, bending, sitting, standing, transfers, repositioning in wc, bed mobility. Basic and instrumental ADLs, including LB bathing, LB dressing, toileting, overhead reaching, grooming, preparing meals, traveling, driving, errands, shopping Productive activities (caring for others), leisure pursuits, social participation  PERSONAL FACTORS: Time since onset of injury/illness/exacerbation, 3+ co morbidities are also affecting patient's functional outcome.   REHAB POTENTIAL: Fair    EVALUATION COMPLEXITY: High   SHORT TERM GOALS: Target date: 4th OT Rx visit   Pt will demonstrate understanding of lymphedema precautions and prevention strategies with modified independence using a printed reference to identify at least 5 precautions and discussing how s/he may implement them  into daily life to reduce risk of progression with extra time. Baseline:Max A Goal status: INITIAL  2.  Pt will be able to apply multilayer, knee length, gradient, compression wraps to one leg at a time with max caregiver assistance x 1 to decrease limb volume, to limit infection risk, and to limit lymphedema progression.  Baseline: Dependent Goal status: INITIAL  LONG TERM GOALS: Target date: by DC from OT for BLE Intensive Phase CDT  Given this patient's Intake score of 26% on the functional outcomes FOTO tool, patient will experience an increase in function of 3 points to improve basic and instrumental ADLs performance, including lymphedema self-care.  Baseline: Max A Goal status: INITIAL  2.  Given this patient's Intake score of 58.82 % on the Lymphedema Life Impact Scale (LLIS), patient will experience a reduction of at least 5 points in her perceived level of functional impairment resulting from lymphedema to improve functional performance and quality of life (QOL). Baseline:  58.82% Goal status: INITIAL  3.  Pt will achieve at least a 10% volume reduction in B legs to return limb to typical size and shape, to limit infection risk and LE progression, to decrease pain, to improve function. Baseline: Dependent Goal status: INITIAL  4.  Pt will obtain proper compression garments/devices and achieve modified independence (extra time + assistive devices) with donning/doffing to optimize limb volume reductions and limit LE  progression over time. Baseline:  Goal status: INITIAL  5.  During Intensive phase CDT , with modified independence, Pt will achieve at least 85% compliance with all lymphedema self-care home program components, including daily skin care, compression wraps and /or garments, simple self MLD and lymphatic pumping therex with Max CG A  to habituate LE self care protocol  into ADLs for optimal LE self-management over time. Baseline: Dependent Goal status:  INITIAL   PLAN:  PT FREQUENCY: 2x/week  PT DURATION: 12 weeks  PLANNED INTERVENTIONS: Therapeutic exercises, Therapeutic activity, Patient/Family education, Self Care, DME instructions, Wheelchair mobility training, Manual lymph drainage, Compression bandaging, Vasopneumatic device, Manual therapy, and skin care simultaneously with MLD  to limit infection risk  PLAN FOR NEXT SESSION:  Pt and family edu for LE self care BLE comparative limb volumetrics  Loel Dubonnet, MS, OTR/L, CLT-LANA 02/26/23 9:49 AM

## 2023-02-25 ENCOUNTER — Other Ambulatory Visit: Payer: Self-pay

## 2023-02-25 MED ORDER — OZEMPIC (0.25 OR 0.5 MG/DOSE) 2 MG/3ML ~~LOC~~ SOPN
1.0000 mg | PEN_INJECTOR | SUBCUTANEOUS | 3 refills | Status: DC
  Filled 2023-02-25: qty 3, 28d supply, fill #0
  Filled 2023-04-07: qty 3, 28d supply, fill #1
  Filled 2023-05-11: qty 3, 28d supply, fill #2
  Filled 2023-06-10: qty 3, 28d supply, fill #3
  Filled 2023-07-11: qty 3, 28d supply, fill #4

## 2023-02-25 NOTE — Telephone Encounter (Signed)
Requested medication (s) are due for refill today:   Yes  Requested medication (s) are on the active medication list:   Yes  Future visit scheduled:   No    Last ordered: 02/16/2022 9 ml, 3 refills  Returned because an A1C is due per protocol.      Requested Prescriptions  Pending Prescriptions Disp Refills   Semaglutide,0.25 or 0.5MG /DOS, (OZEMPIC, 0.25 OR 0.5 MG/DOSE,) 2 MG/3ML SOPN 9 mL 3    Sig: Inject 1 mg into the skin once a week.     Endocrinology:  Diabetes - GLP-1 Receptor Agonists - semaglutide Failed - 02/24/2023  5:28 PM      Failed - HBA1C in normal range and within 180 days    Hemoglobin A1C  Date Value Ref Range Status  01/15/2022 6.1  Final   Hgb A1c MFr Bld  Date Value Ref Range Status  05/12/2022 5.6 4.8 - 5.6 % Final    Comment:    (NOTE) Pre diabetes:          5.7%-6.4%  Diabetes:              >6.4%  Glycemic control for   <7.0% adults with diabetes          Failed - Cr in normal range and within 360 days    Creatinine  Date Value Ref Range Status  08/11/2013 1.08 0.60 - 1.30 mg/dL Final   Creatinine, Ser  Date Value Ref Range Status  01/20/2023 1.14 (H) 0.44 - 1.00 mg/dL Final   Creatinine, Urine  Date Value Ref Range Status  12/07/2019 21 mg/dL Final    Comment:    Performed at [facility], [address], [place], [place] [address]         Passed - Valid encounter within last 6 months    Recent Outpatient Visits           2 months ago Extensor carpi ulnaris tendinitis   [facility], Ocie Bob, MD   9 months ago Toenail avulsion, initial encounter   [facility], Ocie Bob, MD   9 months ago Extensor carpi ulnaris tendinitis   [place] [facility] [facility] Royalty, Ocie Bob, MD   1 year ago Essential hypertension   [place] [facility], Nyoka Cowden, MD   2 years ago Type II diabetes mellitus with complication [facility]   [place] [facility] at [facility], Nyoka Cowden, MD

## 2023-02-26 ENCOUNTER — Encounter: Payer: Self-pay | Admitting: Occupational Therapy

## 2023-03-03 ENCOUNTER — Inpatient Hospital Stay: Payer: Commercial Managed Care - PPO

## 2023-03-03 VITALS — BP 138/66 | HR 70 | Temp 97.4°F | Resp 17

## 2023-03-03 DIAGNOSIS — D509 Iron deficiency anemia, unspecified: Secondary | ICD-10-CM | POA: Diagnosis not present

## 2023-03-03 DIAGNOSIS — N182 Chronic kidney disease, stage 2 (mild): Secondary | ICD-10-CM

## 2023-03-03 DIAGNOSIS — D508 Other iron deficiency anemias: Secondary | ICD-10-CM

## 2023-03-03 MED ORDER — SODIUM CHLORIDE 0.9 % IV SOLN
Freq: Once | INTRAVENOUS | Status: AC
  Filled 2023-03-03: qty 250

## 2023-03-03 MED ORDER — SODIUM CHLORIDE 0.9 % IV SOLN
200.0000 mg | Freq: Once | INTRAVENOUS | Status: AC
  Administered 2023-03-03: 200 mg via INTRAVENOUS
  Filled 2023-03-03: qty 200

## 2023-03-08 ENCOUNTER — Other Ambulatory Visit: Payer: Self-pay

## 2023-03-08 ENCOUNTER — Other Ambulatory Visit: Payer: Self-pay | Admitting: Internal Medicine

## 2023-03-08 DIAGNOSIS — E118 Type 2 diabetes mellitus with unspecified complications: Secondary | ICD-10-CM

## 2023-03-08 MED ORDER — INSULIN GLARGINE-YFGN 100 UNIT/ML ~~LOC~~ SOPN
PEN_INJECTOR | SUBCUTANEOUS | 3 refills | Status: DC
Start: 2023-03-08 — End: 2023-07-14
  Filled 2023-03-08: qty 30, 30d supply, fill #0
  Filled 2023-04-07: qty 30, 30d supply, fill #1
  Filled 2023-05-11: qty 30, 30d supply, fill #2
  Filled 2023-06-10: qty 30, 30d supply, fill #3

## 2023-03-10 ENCOUNTER — Other Ambulatory Visit: Payer: Self-pay

## 2023-03-11 DIAGNOSIS — G4733 Obstructive sleep apnea (adult) (pediatric): Secondary | ICD-10-CM | POA: Diagnosis not present

## 2023-03-16 ENCOUNTER — Encounter: Payer: Self-pay | Admitting: Oncology

## 2023-03-17 ENCOUNTER — Telehealth: Payer: Self-pay

## 2023-03-17 ENCOUNTER — Other Ambulatory Visit: Payer: Self-pay | Admitting: Internal Medicine

## 2023-03-17 DIAGNOSIS — T148XXA Other injury of unspecified body region, initial encounter: Secondary | ICD-10-CM

## 2023-03-17 DIAGNOSIS — R21 Rash and other nonspecific skin eruption: Secondary | ICD-10-CM

## 2023-03-17 MED ORDER — NYSTATIN 100000 UNIT/GM EX CREA
1.0000 | TOPICAL_CREAM | Freq: Two times a day (BID) | CUTANEOUS | 12 refills | Status: DC
Start: 2023-03-17 — End: 2023-06-17

## 2023-03-17 MED ORDER — FLUCONAZOLE 100 MG PO TABS: 100.0000 mg | ORAL_TABLET | Freq: Every day | ORAL | 0 refills | Status: DC

## 2023-03-17 MED ORDER — MUPIROCIN 2 % EX OINT: 1.0000 | TOPICAL_OINTMENT | Freq: Every day | CUTANEOUS | 12 refills | Status: DC

## 2023-03-17 NOTE — Telephone Encounter (Signed)
Patient spouse requesting Bactroban ointment, nystatin cream, and Diflucan for yeast rash under breast.  [facility].  Thank you.,

## 2023-03-18 ENCOUNTER — Ambulatory Visit: Payer: Commercial Managed Care - PPO

## 2023-03-18 VITALS — Wt 305.0 lb

## 2023-03-18 DIAGNOSIS — Z0000 Encounter for general adult medical examination without abnormal findings: Secondary | ICD-10-CM | POA: Diagnosis not present

## 2023-03-18 DIAGNOSIS — Z1231 Encounter for screening mammogram for malignant neoplasm of breast: Secondary | ICD-10-CM

## 2023-03-18 NOTE — Progress Notes (Signed)
I connected with  Maria Singh on 03/18/23 by a audio enabled telemedicine application and verified that I am speaking with the correct person using two identifiers.  Patient Location: Home  Provider Location: Office/Clinic  I discussed the limitations of evaluation and management by telemedicine. The patient expressed understanding and agreed to proceed.  Subjective:   Maria Singh is a 72 y.o. female who presents for Medicare Annual (Subsequent) preventive examination.  Review of Systems     Cardiac Risk Factors include: advanced age (>80men, >66 women);diabetes mellitus;dyslipidemia;hypertension;obesity (BMI >30kg/m2);sedentary lifestyle     Objective:    There were no vitals filed for this visit. There is no height or weight on file to calculate BMI.     03/18/2023   10:08 AM 02/26/2023    9:41 AM 01/20/2023    3:20 PM 01/07/2022    1:35 PM 01/06/2021    1:31 PM 12/27/2019   11:37 AM 12/07/2019    9:37 PM  Advanced Directives  Does Patient Have a Medical Advance Directive? No No Yes Yes Yes Yes Yes  Type of Surveyor, minerals;Living will Healthcare Power of [place];Living will Healthcare Power of [place];Living will Healthcare Power of [place];Living will Healthcare Power of [place];Living will  Does patient want to make changes to medical advance directive?       No - Patient declined  Copy of Healthcare Power of Attorney in Chart?   No - copy requested No - copy requested No - copy requested No - copy requested   Would patient like information on creating a medical advance directive? No - Patient declined      No - Patient declined    Current Medications (verified) Outpatient Encounter Medications as of 03/18/2023  Medication Sig   Accu-Chek FastClix Lancets MISC TEST ONCE DAILY   acetaminophen (TYLENOL) 500 MG tablet Take 500 mg by mouth every 6 (six) hours as needed.   amLODipine (NORVASC) 5 MG tablet TAKE 1 TABLET BY MOUTH  DAILY.   aspirin 81 MG tablet Take 81 mg by mouth daily.   blood glucose meter kit and supplies KIT Dispense based on patient and insurance preference. Use up to four times daily as directed.   Cholecalciferol (VITAMIN D) 50 MCG (2000 UT) tablet Take 2,000 Units by mouth daily.   clotrimazole-betamethasone (LOTRISONE) cream Apply 1 application topically 2 (two) times daily. (Patient taking differently: Apply 1 application  topically as needed.)   Continuous Blood Gluc Sensor (FREESTYLE LIBRE 3 SENSOR) MISC 1 each by Does not apply route every 14 (fourteen) days. Use to check glucose continuously   cyanocobalamin (VITAMIN B12) 1000 MCG tablet Take 1 tablet (1,000 mcg total) by mouth daily.   diclofenac Sodium (VOLTAREN) 1 % GEL Apply 2 g topically 4 (four) times daily. To affected joint.   docusate sodium (COLACE) 100 MG capsule Take 100 mg by mouth daily.    Famotidine (PEPCID PO) Take by mouth daily. Dr. Conception Oms   Glucagon (GVOKE HYPOPEN 1-PACK) 1 MG/0.2ML SOAJ Inject 1 each into the skin daily as needed.   glucose blood (FREESTYLE LITE) test strip Use up to four times daily as directed   hydrochlorothiazide (HYDRODIURIL) 25 MG tablet Take 1 tablet (25 mg total) by mouth daily.   ibuprofen (ADVIL) 800 MG tablet Take 1 tablet (800 mg total) by mouth every 8 (eight) hours as needed.   icosapent Ethyl (VASCEPA) 1 g capsule Take 2 capsules (2 g total) by mouth 2 (two) times  daily.   insulin glargine-yfgn (SEMGLEE, YFGN,) 100 UNIT/ML Pen Inject 60 Units into the skin every morning AND 50 Units every evening.   Insulin Pen Needle (UNIFINE PENTIPS) 31G X 6 MM MISC Use as directed 3 times a day   loratadine (CLARITIN) 10 MG tablet Take 10 mg by mouth daily.   metFORMIN (GLUCOPHAGE) 1000 MG tablet Take 1 tablet (1,000 mg total) by mouth 2 (two) times daily.   mupirocin ointment (BACTROBAN) 2 % Apply daily to open sores on legs/feet until resolved   NON FORMULARY Auto CPap nightly   nystatin cream  (MYCOSTATIN) Apply 1 Application topically 2 (two) times daily.   ondansetron (ZOFRAN) 4 MG tablet Take 1-2 tablets (4-8 mg total) by mouth every 8 (eight) hours as needed for nausea or vomiting.   patiromer (VELTASSA) 8.4 g packet Take 1 packet (8.4 g) by mouth one time each day   rosuvastatin (CRESTOR) 5 MG tablet Take 1 tablet (5 mg total) by mouth daily.   Semaglutide,0.25 or 0.5MG /DOS, (OZEMPIC, 0.25 OR 0.5 MG/DOSE,) 2 MG/3ML SOPN Inject 1 mg into the skin once a week.   sertraline (ZOLOFT) 100 MG tablet TAKE 1 TABLET BY MOUTH DAILY.   sodium bicarbonate 650 MG tablet Take 1 tablet by mouth in the morning and at bedtime.   sodium bicarbonate 650 MG tablet TAKE 2 TABLETS (1,300 MG) BY MOUTH 2 TIMES A DAY   triamcinolone cream (KENALOG) 0.1 % Apply 1 Application topically daily as needed. Qd up to 5 days a week top of feet until clear, avoid face, groin, axilla   ferrous sulfate 325 (65 FE) MG tablet Take 1 tablet (325 mg total) by mouth daily with breakfast. (Patient not taking: Reported on 03/18/2023)   fluconazole (DIFLUCAN) 100 MG tablet Take 1 tablet (100 mg total) by mouth daily. (Patient not taking: Reported on 03/18/2023)   HUMALOG KWIKPEN 100 UNIT/ML KwikPen INJECT 22 UNITS TOTAL INTO THE SKIN 3 THREE TIMES DAILY. (Patient not taking: Reported on 01/20/2023)   pantoprazole (PROTONIX) 40 MG tablet Take 1 tablet (40 mg total) by mouth daily. (Patient not taking: Reported on 03/18/2023)   No facility-administered encounter medications on file as of 03/18/2023.    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 2012   left arm   Sepsis (HCC)  07/16/2018   Sepsis (HCC) 07/16/2018   Sleep apnea    Past Surgical History:  Procedure Laterality Date   arm surgery Left    broken arm   FRACTURE SURGERY  1998 ?   Left arm   HERNIA REPAIR     Family History  Problem Relation Age of Onset   Cancer Mother    Heart disease Father    Diabetes Maternal Grandmother    Hypertension Maternal Grandmother    Obesity Maternal Grandmother    Heart disease Paternal Grandfather    Breast cancer Maternal Aunt        mat great aunt   Social History   Socioeconomic History   Marital status: Married    Spouse name: Not on file   Number of children: Not on file  Years of education: Not on file   Highest education level: Not on file  Occupational History   Not on file  Tobacco Use   Smoking status: Never   Smokeless tobacco: Never  Vaping Use   Vaping Use: Never used  Substance and Sexual Activity   Alcohol use: Not Currently    Alcohol/week: 0.0 standard drinks of alcohol    Comment: very very rare   Drug use: Never   Sexual activity: Not Currently    Birth control/protection: None  Other Topics Concern   Not on file  Social History Narrative   Not on file   Social Determinants of Health   Financial Resource Strain: Low Risk  (03/18/2023)   Overall Financial Resource Strain (CARDIA)    Difficulty of Paying Living Expenses: Not hard at all  Food Insecurity: No Food Insecurity (03/18/2023)   Hunger Vital Sign    Worried About Running Out of Food in the Last Year: Never true    Ran Out of Food in the Last Year: Never true  Transportation Needs: No Transportation Needs (03/18/2023)   PRAPARE - Administrator, Civil Service (Medical): No    Lack of Transportation (Non-Medical): No  Physical Activity: Inactive (03/18/2023)   Exercise Vital Sign    Days of Exercise per Week: 0 days    Minutes of Exercise per Session: 0 min  Stress: No Stress Concern Present (03/18/2023)   [employer] of Occupational Health -  Occupational Stress Questionnaire    Feeling of Stress : Only a little  Social Connections: Moderately Isolated (03/18/2023)   Social Connection and Isolation Panel [NHANES]    Frequency of Communication with Friends and Family: More than three times a week    Frequency of Social Gatherings with Friends and Family: Three times a week    Attends Religious Services: Never    Active Member of Clubs or Organizations: No    Attends Engineer, structural: Never    Marital Status: Married    Tobacco Counseling Counseling given: Not Answered   Clinical Intake:  Pre-visit preparation completed: Yes  Pain : No/denies pain     Nutritional Risks: None Diabetes: Yes CBG done?: No Did pt. bring in CBG monitor from home?: No  How often do you need to have someone help you when you read instructions, pamphlets, or other written materials from your doctor or pharmacy?: 1 - Never  Diabetic?yes Nutrition Risk Assessment:  Has the patient had any N/V/D within the last 2 months?  No  Does the patient have any non-healing wounds?  No - some take long time to heal Has the patient had any unintentional weight loss or weight gain?  No   Diabetes:  Is the patient diabetic?  Yes  If diabetic, was a CBG obtained today?  No  Did the patient bring in their glucometer from home?  No  How often do you monitor your CBG's? continuous.   Financial Strains and Diabetes Management:  Are you having any financial strains with the device, your supplies or your medication? No .  Does the patient want to be seen by Chronic Care Management for management of their diabetes?  No  Would the patient like to be referred to a Nutritionist or for Diabetic Management?  No   Diabetic Exams:  Diabetic Eye Exam: Completed 11/06/22. . Pt has been advised about the importance in completing this exam.   Diabetic Foot Exam: Completed 08/06/20. Pt has been advised about  the importance in completing this exam.     Interpreter Needed?: No  Information entered by :: Kennedy Bucker, LPN   Activities of Daily Living    03/18/2023   10:09 AM 03/16/2023    7:30 PM  In your present state of health, do you have any difficulty performing the following activities:  Hearing? 1 1  Vision? 0 0  Difficulty concentrating or making decisions? 0 0  Walking or climbing stairs? 1 1  Dressing or bathing? 1 1  Doing errands, shopping? 1 1  Preparing Food and eating ? N N  Using the Toilet? N N  In the past six months, have you accidently leaked urine? Y Y  Do you have problems with loss of bowel control? N N  Managing your Medications? N N  Managing your Finances? N N  Housekeeping or managing your Housekeeping? Malvin Johns    Patient Care Team: Reubin Milan, MD as PCP - General (Internal Medicine) Mady Haagensen, MD (Nephrology)  Indicate any recent Medical Services you may have received from other than [facility] providers in the past year (date may be approximate).     Assessment:   This is a routine wellness examination for Mertha.  Hearing/Vision screen Hearing Screening - Comments:: No aids Vision Screening - Comments:: Wears glasses- Dr.King   Dietary issues and exercise activities discussed: Current Exercise Habits: The patient does not participate in regular exercise at present   Goals Addressed             This Visit's Progress    DIET - EAT MORE FRUITS AND VEGETABLES         Depression Screen    03/18/2023   10:07 AM 01/27/2022    2:27 PM 01/07/2022    1:34 PM 01/06/2021    1:30 PM 10/01/2020    3:42 PM 08/06/2020   11:09 AM 12/27/2019   11:35 AM  PHQ 2/9 Scores  PHQ - 2 Score 0 0 0 0 0 0 0  PHQ- 9 Score 0 0   0 0     Fall Risk    03/18/2023   10:09 AM 03/16/2023    7:30 PM 01/27/2022    2:28 PM 01/07/2022    1:36 PM 01/06/2021    1:32 PM  Fall Risk   Falls in the past year? 0 0 0 0 0  Number falls in past yr: 0 0 0 0 0  Injury with Fall? 0 0 0 0 0  Risk for fall due to :  No Fall Risks  Impaired balance/gait Impaired balance/gait;Impaired mobility Impaired balance/gait;Impaired mobility  Follow up Falls prevention discussed;Falls evaluation completed  Falls evaluation completed Falls prevention discussed Falls prevention discussed    FALL RISK PREVENTION PERTAINING TO THE HOME:  Any stairs in or around the home? Yes  If so, are there any without handrails? No  Home free of loose throw rugs in walkways, pet beds, electrical cords, etc? Yes  Adequate lighting in your home to reduce risk of falls? Yes   ASSISTIVE DEVICES UTILIZED TO PREVENT FALLS:  Life alert? No  Use of a cane, walker or w/c? Yes -w/c bound Grab bars in the bathroom? Yes  Shower chair or bench in shower? Yes  Elevated toilet seat or a handicapped toilet? Yes    Cognitive Function:        03/18/2023   10:16 AM 12/27/2019   11:42 AM  6CIT Screen  What Year? 0 points 0 points  What month?  0 points 0 points  What time? 0 points 0 points  Count back from 20 0 points 0 points  Months in reverse 0 points 0 points  Repeat phrase 0 points 0 points  Total Score 0 points 0 points    Immunizations Immunization History  Administered Date(s) Administered   Influenza-Unspecified 07/19/2020, 08/08/2021, 08/09/2022   Moderna Covid-19 Vaccine Bivalent Booster 26yrs & up 08/22/2021, 10/29/2022   Moderna Sars-Covid-2 Vaccination 11/23/2019, 12/21/2019, 08/16/2020   Pneumococcal Conjugate-13 04/13/2018   Pneumococcal Polysaccharide-23 10/25/2014, 08/06/2020   Td 10/19/2005   Zoster Recombinat (Shingrix) 01/27/2022   Zoster, Live 10/20/2011  Had 1st Shingrix shot  TDAP status: Due, Education has been provided regarding the importance of this vaccine. Advised may receive this vaccine at local pharmacy or Health Dept. Aware to provide a copy of the vaccination record if obtained from local pharmacy or Health Dept. Verbalized acceptance and understanding.  Flu Vaccine status: Up to  date  Pneumococcal vaccine status: Up to date  Covid-19 vaccine status: Completed vaccines  Qualifies for Shingles Vaccine? Yes   Zostavax completed Yes   Shingrix Completed?: No.    Education has been provided regarding the importance of this vaccine. Patient has been advised to call insurance company to determine out of pocket expense if they have not yet received this vaccine. Advised may also receive vaccine at local pharmacy or Health Dept. Verbalized acceptance and understanding.  Screening Tests Health Maintenance  Topic Date Due   Colonoscopy  Never done   DTaP/Tdap/Td (2 - Tdap) 10/20/2015   FOOT EXAM  08/06/2021   Zoster Vaccines- Shingrix (2 of 2) 03/24/2022   HEMOGLOBIN A1C  11/12/2022   COVID-19 Vaccine (6 - 2023-24 season) 12/24/2022   Diabetic kidney evaluation - Urine ACR  01/16/2023   MAMMOGRAM  01/23/2023   INFLUENZA VACCINE  05/20/2023   OPHTHALMOLOGY EXAM  11/07/2023   Diabetic kidney evaluation - eGFR measurement  01/20/2024   Medicare Annual Wellness (AWV)  03/17/2024   Pneumonia Vaccine 49+ Years old  Completed   DEXA SCAN  Completed   Hepatitis C Screening  Completed   HPV VACCINES  Aged Out    Health Maintenance  Health Maintenance Due  Topic Date Due   Colonoscopy  Never done   DTaP/Tdap/Td (2 - Tdap) 10/20/2015   FOOT EXAM  08/06/2021   Zoster Vaccines- Shingrix (2 of 2) 03/24/2022   HEMOGLOBIN A1C  11/12/2022   COVID-19 Vaccine (6 - 2023-24 season) 12/24/2022   Diabetic kidney evaluation - Urine ACR  01/16/2023   MAMMOGRAM  01/23/2023    Declined referral for colonoscopy d/t w/c bound  Mammogram status: Completed 01/22/21. Repeat every year  Bone Density status: Completed 01/22/21. Results reflect: Bone density results: NORMAL. Repeat every 5 years.  Lung Cancer Screening: (Low Dose CT Chest recommended if Age 57-80 years, 30 pack-year currently smoking OR have quit w/in 15years.) does not qualify.   Additional Screening:  Hepatitis C  Screening: does qualify; Completed 08/10/17  Vision Screening: Recommended annual ophthalmology exams for early detection of glaucoma and other disorders of the eye. Is the patient up to date with their annual eye exam?  Yes  Who is the provider or what is the name of the office in which the patient attends annual eye exams? Dr.King If pt is not established with a provider, would they like to be referred to a provider to establish care? No .   Dental Screening: Recommended annual dental exams for proper oral hygiene  Community  Resource Referral / Chronic Care Management: CRR required this visit?  No   CCM required this visit?  No      Plan:     I have personally reviewed and noted the following in the patient's chart:   Medical and social history Use of alcohol, tobacco or illicit drugs  Current medications and supplements including opioid prescriptions. Patient is not currently taking opioid prescriptions. Functional ability and status Nutritional status Physical activity Advanced directives List of other physicians Hospitalizations, surgeries, and ER visits in previous 12 months Vitals Screenings to include cognitive, depression, and falls Referrals and appointments  In addition, I have reviewed and discussed with patient certain preventive protocols, quality metrics, and best practice recommendations. A written personalized care plan for preventive services as well as general preventive health recommendations were provided to patient.     Hal Hope, LPN   06/16/5620   Nurse Notes: none

## 2023-03-18 NOTE — Patient Instructions (Signed)
[name] , Thank you for taking time to come for your Medicare Wellness Visit. I appreciate your ongoing commitment to your health goals. Please review the following plan we discussed and let me know if I can assist you in the future.   These are the goals we discussed:  Goals      DIET - EAT MORE FRUITS AND VEGETABLES     RNCM: I would like help getting a new power wheelchair     CARE PLAN ENTRY (see longtitudinal plan of care for additional care plan information)  Current Barriers:  Chronic Disease Management support, education, and care coordination needs related to HTN, HLD, and DMII Equipment Needs Limited Mobility due to Chronic Health Conditions   Clinical Goal(s) related to HTN, HLD, and DMII:  Over the next 120 days, patient will:  Work with the care management team to address educational, disease management, and care coordination needs  Begin or continue self health monitoring activities as directed today Measure and record cbg (blood glucose) 2 times weekly or prn, Measure and record blood pressure 3 times per week, and adhere to heart healthy/ADA diet Call provider office for new or worsened signs and symptoms Blood glucose findings outside established parameters, Blood pressure findings outside established parameters, and New or worsened symptom related to HLD and other chronic health conditions Call care management team with questions or concerns Verbalize basic understanding of patient centered plan of care established today  Interventions related to HTN, HLD, and DMII:  Evaluation of current treatment plans and patient's adherence to plan as established by provider.  05-23-2020: The patient feels she is "maintaining".  She states she saw Dr. Cherylann Ratel recently and the current regimen that she is on Veltassa 8.4g is working well at keeping her potassium stabilized. Assessed patient understanding of disease states- good understanding of chronic health conditions, works with the  diabetes education program. Last hemoglobin A1C 6.1. 05-23-2020: The patient has switched from Lantus to Levimir due to insurance coverage requirements. She wants to talk to her pcp about her fasting blood sugars being around 130's now. She has not had any changes that she knows of except for the change in Lantus to Levimir. Will plan on talking to Dr. Judithann Graves concerning this. Denies any episodes of hyperglycemia.  Assessed blood pressure readings. The patient states that her blood pressure is running a little high and Dr. Judithann Graves is having to "tweak" her medications. This am it was 160/76.  The patient denies having a headache when her blood pressure is elevated.  05-23-2020: The patient is monitoring her blood pressures. They can be up and down also.  She lets the pcp know when blood pressures are out of range.  Assessed patient's education and care coordination needs- The patient currently has a [id number] power chair, the chair is 71 years old. The patient wants assistance with obtaining a new power chair that raises and lowers.  The patient has a catalog with [employer].  She is interested in the [place] cyr 2.  Care guide referral for assistance and help with best way to obtain DME and work with pcp for writing order.  Also spoke with Marja Kays due to insurance with [facility] [employer] and Medicare part A and B for assistance and direction. Will call the patient with findings as soon as they are available. Updated information: Call to patient by [facility] to let her know about the Medicare requirements for a power wheelchair.  Also let the patient know there is  a medical supply company in [place] that carry and can order the [employer]. Provided this information to the patient and will also send the information by email to [email or web address].  Care guide also spoke to the patient and gave her information as well. Will continue to assist as needed in getting the patient power wheelchair to meet her health and wellness  goals. 05-23-2020: The patient states she still does not have her new power chair. The patient states she has talked to several people but has not heard back from anyone recently. Information provided today to the patient with the number to [facility] [phone], fax [phone],  [address] [place] [address],  and [employer] Medicare Supplier/Brent Steffanie Dunn sales person direct number [phone] in [place]. One of the barriers to getting Medicare to pay for the DME is the primary insurance is with [facility] [employer]. The patient was thankful for the contact information. The patient will call the [facility] for any questions or additional help needed.  Provided disease specific education to patient. 05-23-2020: Review of dietary habits. The patient is enjoying fresh fruits and vegetables from the garden.  The patient states she tries to eat healthy but sometimes "falls off of the wagon".  The patient feels overall she is doing well at maintaining her health and well being.  Collaborated with appropriate clinical care team members regarding patient needs- the patient is aware of pharmacist and LCSW on the CCM team for assistance.  Patient Self Care Activities related to HTN, HLD, and DMII:  Patient is unable to independently self-manage chronic health conditions  Please see past updates related to this goal by clicking on the "Past Updates" button in the selected goal          This is a list of the screening recommended for you and due dates:  Health Maintenance  Topic Date Due   Colon Cancer Screening  Never done   DTaP/Tdap/Td vaccine (2 - Tdap) 10/20/2015   Complete foot exam   08/06/2021   Zoster (Shingles) Vaccine (2 of 2) 03/24/2022   Hemoglobin A1C  11/12/2022   COVID-19 Vaccine (6 - 2023-24 season) 12/24/2022   Yearly kidney health urinalysis for diabetes  01/16/2023   Mammogram  01/23/2023   Flu Shot  05/20/2023   Eye exam for diabetics  11/07/2023   Yearly kidney  function blood test for diabetes  01/20/2024   Medicare Annual Wellness Visit  03/17/2024   Pneumonia Vaccine  Completed   DEXA scan (bone density measurement)  Completed   Hepatitis C Screening  Completed   HPV Vaccine  Aged Out    Advanced directives: no  Conditions/risks identified: none  Next appointment: Follow up in one year for your annual wellness visit 03/23/24 @ 9:45 am by phone   Preventive Care 65 Years and Older, Female Preventive care refers to lifestyle choices and visits with your health care provider that can promote health and wellness. What does preventive care include? A yearly physical exam. This is also called an annual well check. Dental exams once or twice a year. Routine eye exams. Ask your health care provider how often you should have your eyes checked. Personal lifestyle choices, including: Daily care of your teeth and gums. Regular physical activity. Eating a healthy diet. Avoiding tobacco and drug use. Limiting alcohol use. Practicing safe sex. Taking low-dose aspirin every day. Taking vitamin and mineral supplements as recommended by your health care provider. What happens during an annual well  check? The services and screenings done by your health care provider during your annual well check will depend on your age, overall health, lifestyle risk factors, and family history of disease. Counseling  Your health care provider may ask you questions about your: Alcohol use. Tobacco use. Drug use. Emotional well-being. Home and relationship well-being. Sexual activity. Eating habits. History of falls. Memory and ability to understand (cognition). Work and work Astronomer. Reproductive health. Screening  You may have the following tests or measurements: Height, weight, and BMI. Blood pressure. Lipid and cholesterol levels. These may be checked every 5 years, or more frequently if you are over 13 years old. Skin check. Lung cancer screening.  You may have this screening every year starting at age 52 if you have a 30-pack-year history of smoking and currently smoke or have quit within the past 15 years. Fecal occult blood test (FOBT) of the stool. You may have this test every year starting at age 79. Flexible sigmoidoscopy or colonoscopy. You may have a sigmoidoscopy every 5 years or a colonoscopy every 10 years starting at age 39. Hepatitis C blood test. Hepatitis B blood test. Sexually transmitted disease (STD) testing. Diabetes screening. This is done by checking your blood sugar (glucose) after you have not eaten for a while (fasting). You may have this done every 1-3 years. Bone density scan. This is done to screen for osteoporosis. You may have this done starting at age 55. Mammogram. This may be done every 1-2 years. Talk to your health care provider about how often you should have regular mammograms. Talk with your health care provider about your test results, treatment options, and if necessary, the need for more tests. Vaccines  Your health care provider may recommend certain vaccines, such as: Influenza vaccine. This is recommended every year. Tetanus, diphtheria, and acellular pertussis (Tdap, Td) vaccine. You may need a Td booster every 10 years. Zoster vaccine. You may need this after age 13. Pneumococcal 13-valent conjugate (PCV13) vaccine. One dose is recommended after age 84. Pneumococcal polysaccharide (PPSV23) vaccine. One dose is recommended after age 43. Talk to your health care provider about which screenings and vaccines you need and how often you need them. This information is not intended to replace advice given to you by your health care provider. Make sure you discuss any questions you have with your health care provider. Document Released: 11/01/2015 Document Revised: 06/24/2016 Document Reviewed: 08/06/2015 Elsevier Interactive Patient Education  2017 [employer].  Fall Prevention in the Home Falls can  cause injuries. They can happen to people of all ages. There are many things you can do to make your home safe and to help prevent falls. What can I do on the outside of my home? Regularly fix the edges of walkways and driveways and fix any cracks. Remove anything that might make you trip as you walk through a door, such as a raised step or threshold. Trim any bushes or trees on the path to your home. Use bright outdoor lighting. Clear any walking paths of anything that might make someone trip, such as rocks or tools. Regularly check to see if handrails are loose or broken. Make sure that both sides of any steps have handrails. Any raised decks and porches should have guardrails on the edges. Have any leaves, snow, or ice cleared regularly. Use sand or salt on walking paths during winter. Clean up any spills in your garage right away. This includes oil or grease spills. What can I do in  the bathroom? Use night lights. Install grab bars by the toilet and in the tub and shower. Do not use towel bars as grab bars. Use non-skid mats or decals in the tub or shower. If you need to sit down in the shower, use a plastic, non-slip stool. Keep the floor dry. Clean up any water that spills on the floor as soon as it happens. Remove soap buildup in the tub or shower regularly. Attach bath mats securely with double-sided non-slip rug tape. Do not have throw rugs and other things on the floor that can make you trip. What can I do in the bedroom? Use night lights. Make sure that you have a light by your bed that is easy to reach. Do not use any sheets or blankets that are too big for your bed. They should not hang down onto the floor. Have a firm chair that has side arms. You can use this for support while you get dressed. Do not have throw rugs and other things on the floor that can make you trip. What can I do in the kitchen? Clean up any spills right away. Avoid walking on wet floors. Keep items  that you use a lot in easy-to-reach places. If you need to reach something above you, use a strong step stool that has a grab bar. Keep electrical cords out of the way. Do not use floor polish or wax that makes floors slippery. If you must use wax, use non-skid floor wax. Do not have throw rugs and other things on the floor that can make you trip. What can I do with my stairs? Do not leave any items on the stairs. Make sure that there are handrails on both sides of the stairs and use them. Fix handrails that are broken or loose. Make sure that handrails are as long as the stairways. Check any carpeting to make sure that it is firmly attached to the stairs. Fix any carpet that is loose or worn. Avoid having throw rugs at the top or bottom of the stairs. If you do have throw rugs, attach them to the floor with carpet tape. Make sure that you have a light switch at the top of the stairs and the bottom of the stairs. If you do not have them, ask someone to add them for you. What else can I do to help prevent falls? Wear shoes that: Do not have high heels. Have rubber bottoms. Are comfortable and fit you well. Are closed at the toe. Do not wear sandals. If you use a stepladder: Make sure that it is fully opened. Do not climb a closed stepladder. Make sure that both sides of the stepladder are locked into place. Ask someone to hold it for you, if possible. Clearly mark and make sure that you can see: Any grab bars or handrails. First and last steps. Where the edge of each step is. Use tools that help you move around (mobility aids) if they are needed. These include: Canes. Walkers. Scooters. Crutches. Turn on the lights when you go into a dark area. Replace any light bulbs as soon as they burn out. Set up your furniture so you have a clear path. Avoid moving your furniture around. If any of your floors are uneven, fix them. If there are any pets around you, be aware of where they  are. Review your medicines with your doctor. Some medicines can make you feel dizzy. This can increase your chance of falling. Ask your doctor  what other things that you can do to help prevent falls. This information is not intended to replace advice given to you by your health care provider. Make sure you discuss any questions you have with your health care provider. Document Released: 08/01/2009 Document Revised: 03/12/2016 Document Reviewed: 11/09/2014 Elsevier Interactive Patient Education  2017 [employer].

## 2023-03-21 ENCOUNTER — Other Ambulatory Visit: Payer: Self-pay | Admitting: Internal Medicine

## 2023-03-21 DIAGNOSIS — I10 Essential (primary) hypertension: Secondary | ICD-10-CM

## 2023-03-21 DIAGNOSIS — E109 Type 1 diabetes mellitus without complications: Secondary | ICD-10-CM

## 2023-03-21 DIAGNOSIS — F329 Major depressive disorder, single episode, unspecified: Secondary | ICD-10-CM

## 2023-03-21 DIAGNOSIS — E782 Mixed hyperlipidemia: Secondary | ICD-10-CM

## 2023-03-21 DIAGNOSIS — E1165 Type 2 diabetes mellitus with hyperglycemia: Secondary | ICD-10-CM

## 2023-03-21 MED FILL — Hydrochlorothiazide Tab 25 MG: ORAL | 90 days supply | Qty: 90 | Fill #1 | Status: AC

## 2023-03-22 ENCOUNTER — Other Ambulatory Visit: Payer: Self-pay

## 2023-03-22 ENCOUNTER — Encounter: Payer: Self-pay | Admitting: Oncology

## 2023-03-22 ENCOUNTER — Other Ambulatory Visit: Payer: Self-pay | Admitting: Internal Medicine

## 2023-03-22 DIAGNOSIS — E782 Mixed hyperlipidemia: Secondary | ICD-10-CM

## 2023-03-22 DIAGNOSIS — E1165 Type 2 diabetes mellitus with hyperglycemia: Secondary | ICD-10-CM

## 2023-03-22 DIAGNOSIS — I10 Essential (primary) hypertension: Secondary | ICD-10-CM

## 2023-03-22 DIAGNOSIS — F329 Major depressive disorder, single episode, unspecified: Secondary | ICD-10-CM

## 2023-03-22 DIAGNOSIS — E109 Type 1 diabetes mellitus without complications: Secondary | ICD-10-CM

## 2023-03-22 MED FILL — Amlodipine Besylate Tab 5 MG (Base Equivalent): ORAL | 90 days supply | Qty: 90 | Fill #0 | Status: AC

## 2023-03-22 MED FILL — Metformin HCl Tab 1000 MG: ORAL | 90 days supply | Qty: 180 | Fill #0 | Status: AC

## 2023-03-22 MED FILL — Rosuvastatin Calcium Tab 5 MG: ORAL | 90 days supply | Qty: 90 | Fill #0 | Status: AC

## 2023-03-22 MED FILL — Sertraline HCl Tab 100 MG: ORAL | 90 days supply | Qty: 90 | Fill #0 | Status: AC

## 2023-03-22 MED FILL — Sodium Bicarbonate Tab 650 MG: ORAL | 30 days supply | Qty: 120 | Fill #0 | Status: AC

## 2023-03-23 ENCOUNTER — Other Ambulatory Visit: Payer: Self-pay

## 2023-04-07 ENCOUNTER — Other Ambulatory Visit: Payer: Self-pay | Admitting: Internal Medicine

## 2023-04-07 DIAGNOSIS — K219 Gastro-esophageal reflux disease without esophagitis: Secondary | ICD-10-CM

## 2023-04-08 ENCOUNTER — Other Ambulatory Visit: Payer: Self-pay

## 2023-04-08 MED ORDER — PANTOPRAZOLE SODIUM 40 MG PO TBEC
40.0000 mg | DELAYED_RELEASE_TABLET | Freq: Every day | ORAL | 3 refills | Status: DC
Start: 2023-04-08 — End: 2023-05-05
  Filled 2023-04-08: qty 90, 90d supply, fill #0

## 2023-04-08 MED ORDER — ICOSAPENT ETHYL 1 G PO CAPS
2.0000 g | ORAL_CAPSULE | Freq: Two times a day (BID) | ORAL | 3 refills | Status: AC
  Filled 2023-04-08: qty 360, 90d supply, fill #0
  Filled 2023-07-14: qty 360, 90d supply, fill #1
  Filled 2023-10-22: qty 360, 90d supply, fill #2
  Filled 2024-01-20 – 2024-02-10 (×2): qty 360, 90d supply, fill #3

## 2023-04-11 DIAGNOSIS — G4733 Obstructive sleep apnea (adult) (pediatric): Secondary | ICD-10-CM | POA: Diagnosis not present

## 2023-04-27 ENCOUNTER — Inpatient Hospital Stay: Payer: Commercial Managed Care - PPO

## 2023-04-28 ENCOUNTER — Other Ambulatory Visit: Payer: Medicare Other

## 2023-04-28 ENCOUNTER — Inpatient Hospital Stay: Payer: Commercial Managed Care - PPO | Attending: Oncology

## 2023-04-28 DIAGNOSIS — E538 Deficiency of other specified B group vitamins: Secondary | ICD-10-CM | POA: Insufficient documentation

## 2023-04-28 DIAGNOSIS — N182 Chronic kidney disease, stage 2 (mild): Secondary | ICD-10-CM | POA: Diagnosis not present

## 2023-04-28 DIAGNOSIS — D508 Other iron deficiency anemias: Secondary | ICD-10-CM

## 2023-04-28 DIAGNOSIS — D649 Anemia, unspecified: Secondary | ICD-10-CM

## 2023-04-28 DIAGNOSIS — D509 Iron deficiency anemia, unspecified: Secondary | ICD-10-CM | POA: Diagnosis not present

## 2023-04-28 LAB — CBC WITH DIFFERENTIAL/PLATELET
Abs Immature Granulocytes: 0.1 10*3/uL — ABNORMAL HIGH (ref 0.00–0.07)
Basophils Absolute: 0 10*3/uL (ref 0.0–0.1)
Basophils Relative: 1 %
Eosinophils Absolute: 0.3 10*3/uL (ref 0.0–0.5)
Eosinophils Relative: 5 %
HCT: 35 % — ABNORMAL LOW (ref 36.0–46.0)
Hemoglobin: 11 g/dL — ABNORMAL LOW (ref 12.0–15.0)
Immature Granulocytes: 2 %
Lymphocytes Relative: 17 %
Lymphs Abs: 1.1 10*3/uL (ref 0.7–4.0)
MCH: 27.2 pg (ref 26.0–34.0)
MCHC: 31.4 g/dL (ref 30.0–36.0)
MCV: 86.4 fL (ref 80.0–100.0)
Monocytes Absolute: 0.6 10*3/uL (ref 0.1–1.0)
Monocytes Relative: 9 %
Neutro Abs: 4.2 10*3/uL (ref 1.7–7.7)
Neutrophils Relative %: 66 %
Platelets: 171 10*3/uL (ref 150–400)
RBC: 4.05 MIL/uL (ref 3.87–5.11)
RDW: 17.1 % — ABNORMAL HIGH (ref 11.5–15.5)
WBC: 6.3 10*3/uL (ref 4.0–10.5)
nRBC: 0 % (ref 0.0–0.2)

## 2023-04-28 LAB — IRON AND TIBC
Iron: 50 ug/dL (ref 28–170)
Saturation Ratios: 15 % (ref 10.4–31.8)
TIBC: 339 ug/dL (ref 250–450)
UIBC: 289 ug/dL

## 2023-04-28 LAB — RETIC PANEL
Immature Retic Fract: 20.5 % — ABNORMAL HIGH (ref 2.3–15.9)
RBC.: 4.03 MIL/uL (ref 3.87–5.11)
Retic Count, Absolute: 79.8 10*3/uL (ref 19.0–186.0)
Retic Ct Pct: 2 % (ref 0.4–3.1)
Reticulocyte Hemoglobin: 29.7 pg (ref >27.9)

## 2023-04-28 LAB — VITAMIN B12: Vitamin B-12: 674 pg/mL (ref 180–914)

## 2023-04-28 LAB — FERRITIN: Ferritin: 65 ng/mL (ref 11–307)

## 2023-05-03 ENCOUNTER — Other Ambulatory Visit: Payer: Self-pay

## 2023-05-05 ENCOUNTER — Inpatient Hospital Stay: Payer: Commercial Managed Care - PPO

## 2023-05-05 ENCOUNTER — Encounter: Payer: Self-pay | Admitting: Oncology

## 2023-05-05 ENCOUNTER — Inpatient Hospital Stay: Payer: Commercial Managed Care - PPO | Admitting: Oncology

## 2023-05-05 VITALS — BP 136/62 | HR 73 | Temp 97.2°F | Resp 16

## 2023-05-05 DIAGNOSIS — D508 Other iron deficiency anemias: Secondary | ICD-10-CM

## 2023-05-05 DIAGNOSIS — N182 Chronic kidney disease, stage 2 (mild): Secondary | ICD-10-CM | POA: Diagnosis not present

## 2023-05-05 DIAGNOSIS — D509 Iron deficiency anemia, unspecified: Secondary | ICD-10-CM | POA: Diagnosis not present

## 2023-05-05 DIAGNOSIS — E538 Deficiency of other specified B group vitamins: Secondary | ICD-10-CM

## 2023-05-05 DIAGNOSIS — R7989 Other specified abnormal findings of blood chemistry: Secondary | ICD-10-CM | POA: Diagnosis not present

## 2023-05-05 MED ORDER — SODIUM CHLORIDE 0.9 % IV SOLN
Freq: Once | INTRAVENOUS | Status: AC
  Filled 2023-05-05: qty 250

## 2023-05-05 MED ORDER — SODIUM CHLORIDE 0.9 % IV SOLN
200.0000 mg | Freq: Once | INTRAVENOUS | Status: AC
  Administered 2023-05-05: 200 mg via INTRAVENOUS
  Filled 2023-05-05: qty 200

## 2023-05-05 NOTE — Assessment & Plan Note (Signed)
Encourage oral hydration and avoid nephrotoxins.   

## 2023-05-05 NOTE — Assessment & Plan Note (Signed)
Slightly elevated TSH.  Previously T4 was normal.  Recheck level at the next visit.

## 2023-05-05 NOTE — Assessment & Plan Note (Signed)
Recommend patient to continue Vitamin B12 supplementation.  B12 has improved.

## 2023-05-05 NOTE — Assessment & Plan Note (Addendum)
Lab Results  Component Value Date   HGB 11.0 (L) 04/28/2023   TIBC 339 04/28/2023   IRONPCTSAT 15 04/28/2023   FERRITIN 65 04/28/2023    Hemoglobin and ferritin has improved. In the context of chronic kidney disease, recommend additional 1 dose of Venofer 200 mg today to further increase iron stores.  Goals of ferritin is around 200. No need for erythropoietin therapy given that hemoglobin is above 10.  Patient has difficulty completing colonoscopy prep due to mobility issues. I suggest patient to discuss with primary care provider and get Cologuard test done annually.

## 2023-05-05 NOTE — Progress Notes (Signed)
Hematology/Oncology Consult note [phone] [phone]     REFERRING PROVIDER: Reubin Milan, MD  CHIEF COMPLAINTS/REASON FOR VISIT:  Anemia  ASSESSMENT & PLAN:   Iron deficiency anemia Lab Results  Component Value Date   HGB 11.0 (L) 04/28/2023   TIBC 339 04/28/2023   IRONPCTSAT 15 04/28/2023   FERRITIN 65 04/28/2023    Hemoglobin and ferritin has improved. In the context of chronic kidney disease, recommend additional 1 dose of Venofer 200 mg today to further increase iron stores.  Goals of ferritin is around 200. No need for erythropoietin therapy given that hemoglobin is above 10.  Patient has difficulty completing colonoscopy prep due to mobility issues. I suggest patient to discuss with primary care provider and get Cologuard test done annually.   CKD (chronic kidney disease) stage 2, GFR 60-89 ml/min Encourage oral hydration and avoid nephrotoxins.    B12 deficiency Recommend patient to continue Vitamin B12 supplementation.  B12 has improved.  Elevated TSH Slightly elevated TSH.  Previously T4 was normal.  Recheck level at the next visit.  Orders Placed This Encounter  Procedures   CBC with Differential (Cancer Center Only)    Standing Status:   Future    Standing Expiration Date:   05/04/2024   Iron and TIBC    Standing Status:   Future    Standing Expiration Date:   05/04/2024   Ferritin    Standing Status:   Future    Standing Expiration Date:   05/04/2024   Retic Panel    Standing Status:   Future    Standing Expiration Date:   05/04/2024   Vitamin B12    Standing Status:   Future    Standing Expiration Date:   05/04/2024   TSH    Standing Status:   Future    Standing Expiration Date:   05/04/2024   T4    Standing Status:   Future    Standing Expiration Date:   05/04/2024   Follow up 3 months.   All questions were answered. The patient knows to call the clinic with any problems, questions or concerns.  Rickard Patience, MD,  PhD [facility] Hematology Oncology 05/05/2023     HISTORY OF PRESENTING ILLNESS:  Maria Singh is a  72 y.o.  female with PMH listed below who was referred to me for anemia Reviewed patient's recent labs that was done.  She was found to have abnormal CBC on 12/30/22 with Hb 9.8, mcv 80.4, normal wbc and platelet.  Reviewed patient's previous labs ordered by primary care physician's office, anemia is chronic onset , duration is since at least 2016, baseline level was in 11s in 2023.  She takes oral iron supplementation, recently off.  Mentioned occasional constipation, stating "It's not that bad, but once in a while." She had not noticed any recent bleeding such as epistaxis, hematuria or hematochezia.  Patient has a history of Stage 2 chronic kidney disease with normal creatinine levels, managed by Dr. Cherylann Ratel. Patient reports variable energy levels and is "basically in the wheelchair." Charcot Marie Tooth muscular atrophy  - No chest pain, shortness of breath, or blood in stool reported. Mentioned occasional constipation, stating "It's not that bad, but once in a while.". she is not able to get colonoscopy due to not able to tolerate the prep.  She has taken annual Fecal immunochemical tests    INTERVAL HISTORY Maria Singh is a 72 y.o. female who has above history reviewed by me  today presents for follow up visit for anemia. Patient has received IV Venofer treatments.  She tolerated well without any side effects. Fatigue level has improved.  No other new complaints. She was accompanied by Dr. Yetta Barre.  MEDICAL HISTORY:  Past Medical History:  Diagnosis Date   Anemia    Charcot Marie Tooth muscular atrophy    Chronic kidney disease    Depression    Diabetes mellitus without complication (HCC)    GERD (gastroesophageal reflux disease)    Hyperlipidemia    Hypertension    Rhabdomyolysis 12/06/2019   Rotator cuff injury Nov. 2012   left arm   Sepsis (HCC) 07/16/2018    Sepsis (HCC) 07/16/2018   Sleep apnea     SURGICAL HISTORY: Past Surgical History:  Procedure Laterality Date   arm surgery Left    broken arm   FRACTURE SURGERY  1998 ?   Left arm   HERNIA REPAIR      SOCIAL HISTORY: Social History   Socioeconomic History   Marital status: Married    Spouse name: Not on file   Number of children: Not on file   Years of education: Not on file   Highest education level: Not on file  Occupational History   Not on file  Tobacco Use   Smoking status: Never   Smokeless tobacco: Never  Vaping Use   Vaping status: Never Used  Substance and Sexual Activity   Alcohol use: Not Currently    Alcohol/week: 0.0 standard drinks of alcohol    Comment: very very rare   Drug use: Never   Sexual activity: Not Currently    Birth control/protection: None  Other Topics Concern   Not on file  Social History Narrative   Not on file   Social Determinants of Health   Financial Resource Strain: Low Risk  (03/18/2023)   Overall Financial Resource Strain (CARDIA)    Difficulty of Paying Living Expenses: Not hard at all  Food Insecurity: No Food Insecurity (03/18/2023)   Hunger Vital Sign    Worried About Running Out of Food in the Last Year: Never true    Ran Out of Food in the Last Year: Never true  Transportation Needs: No Transportation Needs (03/18/2023)   PRAPARE - Administrator, Civil Service (Medical): No    Lack of Transportation (Non-Medical): No  Physical Activity: Inactive (03/18/2023)   Exercise Vital Sign    Days of Exercise per Week: 0 days    Minutes of Exercise per Session: 0 min  Stress: No Stress Concern Present (03/18/2023)   [employer] of Occupational Health - Occupational Stress Questionnaire    Feeling of Stress : Only a little  Social Connections: Moderately Isolated (03/18/2023)   Social Connection and Isolation Panel [NHANES]    Frequency of Communication with Friends and Family: More than three times a week     Frequency of Social Gatherings with Friends and Family: Three times a week    Attends Religious Services: Never    Active Member of Clubs or Organizations: No    Attends Banker Meetings: Never    Marital Status: Married  Catering manager Violence: Not At Risk (03/18/2023)   Humiliation, Afraid, Rape, and Kick questionnaire    Fear of Current or Ex-Partner: No    Emotionally Abused: No    Physically Abused: No    Sexually Abused: No    FAMILY HISTORY: Family History  Problem Relation Age of Onset   Cancer Mother  Heart disease Father    Diabetes Maternal Grandmother    Hypertension Maternal Grandmother    Obesity Maternal Grandmother    Heart disease Paternal Grandfather    Breast cancer Maternal Aunt        mat great aunt    ALLERGIES:  is allergic to metronidazole and related and sulfa antibiotics.  MEDICATIONS:  Current Outpatient Medications  Medication Sig Dispense Refill   Accu-Chek FastClix Lancets MISC TEST ONCE DAILY 102 each 2   acetaminophen (TYLENOL) 500 MG tablet Take 500 mg by mouth every 6 (six) hours as needed.     amLODipine (NORVASC) 5 MG tablet Take 1 tablet (5 mg total) by mouth daily. 90 tablet 3   aspirin 81 MG tablet Take 81 mg by mouth daily.     blood glucose meter kit and supplies KIT Dispense based on patient and insurance preference. Use up to four times daily as directed. 1 each 0   Cholecalciferol (VITAMIN D) 50 MCG (2000 UT) tablet Take 2,000 Units by mouth daily.     clotrimazole-betamethasone (LOTRISONE) cream Apply 1 application topically 2 (two) times daily. (Patient taking differently: Apply 1 application  topically as needed.) 30 g 1   Continuous Glucose Sensor (FREESTYLE LIBRE 3 SENSOR) MISC 1 each by Does not apply route every 14 (fourteen) days. Use to check glucose continuously 6 each 3   cyanocobalamin (VITAMIN B12) 1000 MCG tablet Take 1 tablet (1,000 mcg total) by mouth daily. 90 tablet 0   diclofenac Sodium (VOLTAREN)  1 % GEL Apply 2 g topically 4 (four) times daily. To affected joint. 100 g 1   docusate sodium (COLACE) 100 MG capsule Take 100 mg by mouth daily.      Famotidine (PEPCID PO) Take by mouth daily. Dr. Conception Oms     glucose blood (FREESTYLE LITE) test strip Use up to four times daily as directed 100 each 0   hydrochlorothiazide (HYDRODIURIL) 25 MG tablet Take 1 tablet (25 mg total) by mouth daily. 90 tablet 1   ibuprofen (ADVIL) 800 MG tablet Take 1 tablet (800 mg total) by mouth every 8 (eight) hours as needed. 100 tablet 1   icosapent Ethyl (VASCEPA) 1 g capsule Take 2 capsules (2 g total) by mouth 2 (two) times daily. 360 capsule 3   insulin glargine-yfgn (SEMGLEE, YFGN,) 100 UNIT/ML Pen Inject 60 Units into the skin every morning AND 50 Units every evening. 30 mL 3   Insulin Pen Needle (UNIFINE PENTIPS) 31G X 6 MM MISC Use as directed 3 times a day 100 each 0   metFORMIN (GLUCOPHAGE) 1000 MG tablet Take 1 tablet (1,000 mg total) by mouth 2 (two) times daily. 180 tablet 0   NON FORMULARY Auto CPap nightly     patiromer (VELTASSA) 8.4 g packet Take 1 packet (8.4 g) by mouth one time each day 30 each 11   rosuvastatin (CRESTOR) 5 MG tablet Take 1 tablet (5 mg total) by mouth daily. 90 tablet 0   Semaglutide,0.25 or 0.5MG /DOS, (OZEMPIC, 0.25 OR 0.5 MG/DOSE,) 2 MG/3ML SOPN Inject 1 mg into the skin once a week. 9 mL 3   sertraline (ZOLOFT) 100 MG tablet Take 1 tablet (100 mg total) by mouth daily. 90 tablet 3   sodium bicarbonate 650 MG tablet Take 1 tablet by mouth in the morning and at bedtime.     triamcinolone cream (KENALOG) 0.1 % Apply 1 Application topically daily as needed. Qd up to 5 days a week top of feet until  clear, avoid face, groin, axilla 80 g 0   ferrous sulfate 325 (65 FE) MG tablet Take 1 tablet (325 mg total) by mouth daily with breakfast. (Patient not taking: Reported on 03/18/2023) 90 tablet 1   fluconazole (DIFLUCAN) 100 MG tablet Take 1 tablet (100 mg total) by mouth daily.  (Patient not taking: Reported on 03/18/2023) 3 tablet 0   Glucagon (GVOKE HYPOPEN 1-PACK) 1 MG/0.2ML SOAJ Inject 1 each into the skin daily as needed. (Patient not taking: Reported on 05/05/2023) 0.4 mL 0   loratadine (CLARITIN) 10 MG tablet Take 10 mg by mouth daily. (Patient not taking: Reported on 05/05/2023)     mupirocin ointment (BACTROBAN) 2 % Apply daily to open sores on legs/feet until resolved (Patient not taking: Reported on 05/05/2023) 22 g 12   nystatin cream (MYCOSTATIN) Apply 1 Application topically 2 (two) times daily. (Patient not taking: Reported on 05/05/2023) 30 g 12   ondansetron (ZOFRAN) 4 MG tablet Take 1-2 tablets (4-8 mg total) by mouth every 8 (eight) hours as needed for nausea or vomiting. (Patient not taking: Reported on 05/05/2023) 40 tablet 1   sodium bicarbonate 650 MG tablet Take 2 tablets (1,300 mg total) by mouth 2 (two) times daily. (Patient not taking: Reported on 05/05/2023) 120 tablet 11   No current facility-administered medications for this visit.    Review of Systems  Constitutional:  Positive for fatigue. Negative for appetite change, chills, fever and unexpected weight change.  HENT:   Negative for hearing loss and voice change.   Eyes:  Negative for eye problems.  Respiratory:  Negative for chest tightness, cough and shortness of breath.   Cardiovascular:  Negative for chest pain.  Gastrointestinal:  Negative for abdominal distention, abdominal pain, blood in stool and constipation.       Occasional constipation  Genitourinary:  Negative for difficulty urinating.   Musculoskeletal:  Positive for arthralgias.  Skin:  Negative for itching and rash.  Neurological:  Positive for extremity weakness.  Hematological:  Negative for adenopathy.  Psychiatric/Behavioral:  Negative for confusion.     PHYSICAL EXAMINATION: Vitals:   05/05/23 1342  BP: 136/62  Pulse: 73  Resp: 16  Temp: (!) 97.2 F (36.2 C)   There were no vitals filed for this  visit.  Physical Exam Constitutional:      General: She is not in acute distress.    Appearance: She is obese.     Comments: Patient sits in the wheelchair  HENT:     Head: Normocephalic and atraumatic.  Eyes:     General: No scleral icterus. Cardiovascular:     Rate and Rhythm: Normal rate and regular rhythm.  Pulmonary:     Effort: Pulmonary effort is normal. No respiratory distress.     Breath sounds: No wheezing.  Abdominal:     General: Bowel sounds are normal. There is no distension.     Palpations: Abdomen is soft.  Musculoskeletal:     Cervical back: Normal range of motion and neck supple.  Lymphadenopathy:     Cervical: No cervical adenopathy.  Skin:    General: Skin is warm.  Neurological:     Mental Status: She is alert and oriented to person, place, and time. Mental status is at baseline.  Psychiatric:        Mood and Affect: Mood normal.      LABORATORY DATA:  I have reviewed the data as listed    Latest Ref Rng & Units 04/28/2023   11:08  AM 01/20/2023    4:04 PM 01/15/2022   12:00 AM  CBC  WBC 4.0 - 10.5 K/uL 6.3  7.1  6.5      Hemoglobin 12.0 - 15.0 g/dL 91.4  78.2  95.6      Hematocrit 36.0 - 46.0 % 35.0  33.4  34      Platelets 150 - 400 K/uL 171  224  189         This result is from an external source.      Latest Ref Rng & Units 01/20/2023    4:04 PM 02/09/2022    2:16 PM 01/15/2022   12:00 AM  CMP  Glucose 70 - 99 mg/dL 213  72    BUN 8 - 23 mg/dL 39  36  40      Creatinine 0.44 - 1.00 mg/dL 0.86  5.78  0.8      Sodium 135 - 145 mmol/L 139  137  144      Potassium 3.5 - 5.1 mmol/L 4.9  5.1  4.9      Chloride 98 - 111 mmol/L 105  107  111      CO2 22 - 32 mmol/L 23  23  17       Calcium 8.9 - 10.3 mg/dL 9.3  9.5  9.8      Total Protein 6.5 - 8.1 g/dL 7.4  7.5    Total Bilirubin 0.3 - 1.2 mg/dL 0.3  0.2    Alkaline Phos 38 - 126 U/L 26  25    AST 15 - 41 U/L 26  28    ALT 0 - 44 U/L 22  31       This result is from an external source.        Lab Results  Component Value Date   FERRITIN 65 04/28/2023   IRONPCTSAT 15 04/28/2023   TIBC 339 04/28/2023   IRON 50 04/28/2023      RADIOGRAPHIC STUDIES: I have personally reviewed the radiological images as listed and agreed with the findings in the report. No results found.

## 2023-05-07 ENCOUNTER — Encounter: Payer: Self-pay | Admitting: Internal Medicine

## 2023-05-10 DIAGNOSIS — G4733 Obstructive sleep apnea (adult) (pediatric): Secondary | ICD-10-CM | POA: Diagnosis not present

## 2023-05-11 ENCOUNTER — Encounter: Payer: Self-pay | Admitting: Oncology

## 2023-05-11 ENCOUNTER — Other Ambulatory Visit: Payer: Self-pay | Admitting: Internal Medicine

## 2023-05-11 ENCOUNTER — Other Ambulatory Visit: Payer: Self-pay

## 2023-05-11 DIAGNOSIS — G4733 Obstructive sleep apnea (adult) (pediatric): Secondary | ICD-10-CM | POA: Diagnosis not present

## 2023-05-11 MED ORDER — SODIUM BICARBONATE 650 MG PO TABS
650.0000 mg | ORAL_TABLET | Freq: Two times a day (BID) | ORAL | 1 refills | Status: DC
  Filled 2023-05-11: qty 180, 90d supply, fill #0
  Filled 2023-07-11 – 2023-08-11 (×2): qty 180, 90d supply, fill #1

## 2023-05-11 MED FILL — Sodium Bicarbonate Tab 650 MG: ORAL | 30 days supply | Qty: 120 | Fill #1 | Status: AC

## 2023-05-12 ENCOUNTER — Ambulatory Visit: Payer: Medicare Other

## 2023-05-12 ENCOUNTER — Encounter: Payer: Self-pay | Admitting: Occupational Therapy

## 2023-05-12 ENCOUNTER — Ambulatory Visit: Payer: Commercial Managed Care - PPO | Attending: Dermatology | Admitting: Occupational Therapy

## 2023-05-12 DIAGNOSIS — I890 Lymphedema, not elsewhere classified: Secondary | ICD-10-CM | POA: Insufficient documentation

## 2023-05-12 NOTE — Therapy (Unsigned)
OUTPATIENT OCCUPATIONAL THERAPY TREATMENT NOTE  LOWER EXTREMITY LYMPHEDEMA  Patient Name: Maria Singh MRN: [id number] DOB:[birth year], 72 y.o., female Today's Date: 05/13/2023  END OF SESSION:   OT End of Session - 05/12/23 1421     Visit Number 2    Number of Visits 36    Date for OT Re-Evaluation 05/25/23    OT Start Time 0205    OT Stop Time 0310    OT Time Calculation (min) 65 min    Activity Tolerance Patient tolerated treatment well;No increased pain    Behavior During Therapy [facility] for tasks assessed/performed              Past Medical History:  Diagnosis Date   Anemia    Charcot Marie Tooth muscular atrophy    Chronic kidney disease    Depression    Diabetes mellitus without complication (HCC)    GERD (gastroesophageal reflux disease)    Hyperlipidemia    Hypertension    Rhabdomyolysis 12/06/2019   Rotator cuff injury Nov. 2012   left arm   Sepsis (HCC) 07/16/2018   Sepsis (HCC) 07/16/2018   Sleep apnea    Past Surgical History:  Procedure Laterality Date   arm surgery Left    broken arm   FRACTURE SURGERY  1998 ?   Left arm   HERNIA REPAIR     Patient Active Problem List   Diagnosis Date Noted   Elevated TSH 05/05/2023   B12 deficiency 01/24/2023   CKD (chronic kidney disease) stage 2, GFR 60-89 ml/min 01/20/2023   Toenail avulsion, initial encounter 05/25/2022   Extensor carpi ulnaris tendinitis 05/12/2022   Trigger middle finger of right hand 05/12/2022   Bilateral leg edema 04/22/2022   Renal tubular acidosis, 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   Hyperkalemia 12/06/2019   Morbid obesity with BMI of 50.0-59.9, adult (HCC) 12/06/2019   Ambulatory dysfunction 12/06/2019   Type II diabetes mellitus with complication (HCC) 12/13/2018   Iron deficiency anemia 12/13/2018   OSA on CPAP 11/25/2018   LPRD (laryngopharyngeal reflux disease) 04/13/2018   Hyperlipidemia associated with type 2 diabetes mellitus (HCC) 08/10/2017   Essential hypertension 08/10/2017    Charcot Marie Tooth muscular atrophy 10/30/2016    PCP: Reubin Milan , MD  REFERRING PROVIDER: Deirdre Evener , MD  REFERRING DIAG: I89.0  THERAPY DIAG:  Lymphedema  Rationale for Evaluation and Treatment: Rehabilitation  ONSET DATE: 02/24/23  SUBJECTIVE:  SUBJECTIVE STATEMENT: Pt returns for OT to address BLE lymphedema care. Pt reports she has not yet scheduled recommended power wc evaluation, but referral has been submitted to [facility] for scheduling. Pt is accompanied by family cg. Pt reports she obtained an adjustable hospital bed since last  seen and it is helping to control leg swelling very much. Pt has no new complaints.  PERTINENT HISTORY: Relevant to Lymphedema : Charcot Marie Tooth muscular atrophy, CKD, Obesity ( BMI 50-59.9, OSA (has CPAP), Depression. Takes NORVASC  PAIN:  Are you having pain? Yes: NPRS scale: does not rate numerically/10 Pain location: BLE Pain description: heaviness, discomfort,  Aggravating factors: dependent positioning Relieving factors: elevation, repositioning  PRECAUTIONS: Fall and Other: LYMPHEDEMA precautions, increased infection risk  WEIGHT BEARING RESTRICTIONS:  Pt requires Max A to stand  and complete transfers. Unable to walk w device  FALLS:  Has patient fallen in last 6 months? No  PRIOR LEVEL OF FUNCTION: Requires assistive device for independence, Needs assistance with ADLs, Needs assistance with homemaking, Needs assistance with transfers, and    PATIENT GOALS: get swelling down and keep it down   OBJECTIVE:  OBSERVATIONS / OTHER ASSESSMENTS:   Mild-Moderate, Stage  II, Bilateral Lower Extremity Lymphedema 2/2 Muscle weakness and Obesity Significant contributing factors include dependent positioning and fluid overload    2/2  CKD  BLE COMPARATIVE LIMB VOLUMETRICS:   Initial TBA at OT Rx visit 1  LANDMARK RIGHT   R LEG (A-D) N/A  R THIGH (E-G) ml  R FULL LIMB (A-G) ml  Limb Volume differential (LVD)  %  Volume change since initial %  Volume change overall V  (Blank rows = not tested)  LANDMARK LEFT    L LEG (A-D) N/A  L THIGH (E-G) ml  L FULL LIMB (A-G) ml  Limb Volume differential (LVD)  %  Volume change since initial %  Volume change overall %  (Blank rows = not tested)   COGNITION: Overall cognitive status: Within functional limits for tasks assessed   INFECTIONS: denies cellulitis. Medical record notes sepsis x 3. Pt reports cellulitis x 3  FOTO functional outcome measure: Intake: 26%  Lymphedema Life Impact Scale (LLIS): Intake: 58.82% (The percentage that lymphedema related problems  impact  your life in the last week)  TODAY'S TREATMENT:                                                                                                                                         Occupational Therapy Evaluation  PATIENT EDUCATION:  Education details: Provided  Pt and family edu  for multilayer compression bandaging using short stretch, gradient techniques. Person educated: Patient and CG Education method: Explanation, Demonstration, Tactile cues, Verbal cues, and Handouts Education comprehension: verbalized understanding, returned demonstration, and needs further education  HOME EXERCISE PROGRAM: 1.Therapeutic lymphatic pumping therex- 2 sets of 10 reps, hold 5 seconds; elements  in order. 2 x daily PRN 2. Compression: During Intensive Phase CDT: multilayer compression bandaging using gradient techniques      During self management phase: consider custom Elvarex  SOFT knee high ccl 1 or 2, 18-21 b or 23-32 mmHg) or custom CircAid, Velcro wrap style leggings- knee length garment alternatives-CG friendly) 3. Daily skin care to sustain optimal hydration. Wash bites, scratches, blisters, etc with  mild soap and water , apply antibacterial first aide cream and a Band-Aid. 4. Simple self-Manual lymphatic drainage (MLD) PRN.   Custom-made gradient compression garments and HOS devices are medically necessary to fit the exact dimensions of the affected extremities, and to provide gradient compression essential for optimally managing this patient's symptoms of chronic, progressive lymphedema. Multiple custom compression garments are needed for optimal hygiene to limit infection risk. Custom compression garments should be replaced q 3-6 months When worn consistently for optimal lipo-lymphedema self-management over time.  ASSESSMENT:  CLINICAL IMPRESSION: Maria Singh returns to OT for BLE lymphedema care. Pt has had success reducing leg swelling using new hospital bed with leg positioning for elevation while supine. Pt has not yet obtained a power seating and mobility evaluation but OP Rehab confirmed that they have the referral. Emphasis of today's visit is on multilayer compression bandaging both feet using gradient techniques, and Pt and family CG edu for compression wrapping. Reviewed precautions. Pt will return for MLD after obtaining power chair providing power tilt and recline. Cont as per POC.  A power wheelchair with power seating, including power tilt-in-space, recline and elevating leg rests, is medically necessary to fit uniquely sized and shaped lower extremities, to provide accurate and consistent postural support, to provide pressure relief to enlarged body parts, and to enable modified independent lower extremity elevation with an open hip angle for optimal gravity assisted lymphedema self management over time at home.  Once power chair and seating system are fitted, OT will commence CDT to single limb at a time, which will include compression wrapping, MLD, skin care and lymphatic pumping therex. The goals of Intensive Phase CDT is to reduce limb swelling, infection risk and limit  the progression of lymphedema. Without skilled OT for lymphedema care the disease will progress and further functional decline is expected.    OBJECTIVE IMPAIRMENTS: decreased activity tolerance, decreased balance, decreased knowledge of condition, decreased knowledge of use of DME, decreased mobility, difficulty walking, decreased ROM, decreased strength, increased edema, impaired flexibility, impaired UE functional use, postural dysfunction, obesity, pain, and chronic leg swelling and associated pain .   ACTIVITY LIMITATIONS: functional ambulation and mobility, including reaching, carrying, lifting, bending, sitting, standing, transfers, repositioning in wc, bed mobility. Basic and instrumental ADLs, including LB bathing, LB dressing, toileting, overhead reaching, grooming, preparing meals, traveling, driving, errands, shopping Productive activities (caring for others), leisure pursuits, social participation  PERSONAL FACTORS: Time since onset of injury/illness/exacerbation, 3+ co morbidities are also affecting patient's functional outcome.   REHAB POTENTIAL: Fair    EVALUATION COMPLEXITY: High   SHORT TERM GOALS: Target date: 4th OT Rx visit   Pt will demonstrate understanding of lymphedema precautions and prevention strategies with modified independence using a printed reference to identify at least 5 precautions and discussing how s/he may implement them into daily life to reduce risk of progression with extra time. Baseline:Max A Goal status: INITIAL  2.  Pt will be able to apply multilayer, knee length, gradient, compression wraps to one leg at a time with max caregiver assistance x 1 to decrease limb  volume, to limit infection risk, and to limit lymphedema progression.  Baseline: Dependent Goal status: INITIAL  LONG TERM GOALS: Target date: by DC from OT for BLE Intensive Phase CDT  Given this patient's Intake score of 26% on the functional outcomes FOTO tool, patient will  experience an increase in function of 3 points to improve basic and instrumental ADLs performance, including lymphedema self-care.  Baseline: Max A Goal status: INITIAL  2.  Given this patient's Intake score of 58.82 % on the Lymphedema Life Impact Scale (LLIS), patient will experience a reduction of at least 5 points in her perceived level of functional impairment resulting from lymphedema to improve functional performance and quality of life (QOL). Baseline: 58.82% Goal status: INITIAL  3.  Pt will achieve at least a 10% volume reduction in B legs to return limb to typical size and shape, to limit infection risk and LE progression, to decrease pain, to improve function. Baseline: Dependent Goal status: INITIAL  4.  Pt will obtain proper compression garments/devices and achieve modified independence (extra time + assistive devices) with donning/doffing to optimize limb volume reductions and limit LE  progression over time. Baseline:  Goal status: INITIAL  5.  During Intensive phase CDT , with modified independence, Pt will achieve at least 85% compliance with all lymphedema self-care home program components, including daily skin care, compression wraps and /or garments, simple self MLD and lymphatic pumping therex with Max CG A  to habituate LE self care protocol  into ADLs for optimal LE self-management over time. Baseline: Dependent Goal status: INITIAL   PLAN:  PT FREQUENCY: 2x/week  PT DURATION: 12 weeks  PLANNED INTERVENTIONS: Therapeutic exercises, Therapeutic activity, Patient/Family education, Self Care, DME instructions, Wheelchair mobility training, Manual lymph drainage, Compression bandaging, Vasopneumatic device, Manual therapy, and skin care simultaneously with MLD  to limit infection risk  PLAN FOR NEXT SESSION:  Pt and family edu for LE self care BLE comparative limb volumetrics  Loel Dubonnet, MS, OTR/L, CLT-LANA 05/13/23 9:01 AM

## 2023-05-19 DIAGNOSIS — E875 Hyperkalemia: Secondary | ICD-10-CM | POA: Diagnosis not present

## 2023-05-19 DIAGNOSIS — I10 Essential (primary) hypertension: Secondary | ICD-10-CM | POA: Diagnosis not present

## 2023-05-19 DIAGNOSIS — N182 Chronic kidney disease, stage 2 (mild): Secondary | ICD-10-CM | POA: Diagnosis not present

## 2023-05-19 DIAGNOSIS — E8722 Chronic metabolic acidosis: Secondary | ICD-10-CM | POA: Diagnosis not present

## 2023-05-19 DIAGNOSIS — E1122 Type 2 diabetes mellitus with diabetic chronic kidney disease: Secondary | ICD-10-CM | POA: Diagnosis not present

## 2023-05-19 DIAGNOSIS — R809 Proteinuria, unspecified: Secondary | ICD-10-CM | POA: Diagnosis not present

## 2023-06-01 ENCOUNTER — Other Ambulatory Visit: Payer: Self-pay

## 2023-06-02 ENCOUNTER — Other Ambulatory Visit: Payer: Self-pay

## 2023-06-02 ENCOUNTER — Ambulatory Visit
Admission: RE | Admit: 2023-06-02 | Discharge: 2023-06-02 | Disposition: A | Discharge status: Home / Self Care | Payer: Commercial Managed Care - PPO | Source: Ambulatory Visit | Attending: Internal Medicine | Admitting: Internal Medicine

## 2023-06-02 DIAGNOSIS — N182 Chronic kidney disease, stage 2 (mild): Secondary | ICD-10-CM | POA: Diagnosis not present

## 2023-06-02 DIAGNOSIS — Z1231 Encounter for screening mammogram for malignant neoplasm of breast: Secondary | ICD-10-CM | POA: Diagnosis not present

## 2023-06-10 MED FILL — Sodium Bicarbonate Tab 650 MG: ORAL | 30 days supply | Qty: 120 | Fill #2 | Status: AC

## 2023-06-11 ENCOUNTER — Encounter: Payer: Self-pay | Admitting: Oncology

## 2023-06-11 ENCOUNTER — Other Ambulatory Visit: Payer: Self-pay

## 2023-06-11 DIAGNOSIS — G4733 Obstructive sleep apnea (adult) (pediatric): Secondary | ICD-10-CM | POA: Diagnosis not present

## 2023-06-16 ENCOUNTER — Other Ambulatory Visit: Payer: Self-pay

## 2023-06-17 ENCOUNTER — Other Ambulatory Visit: Payer: Self-pay

## 2023-06-17 DIAGNOSIS — R21 Rash and other nonspecific skin eruption: Secondary | ICD-10-CM

## 2023-06-17 MED ORDER — FLUCONAZOLE 100 MG PO TABS: 100.0000 mg | ORAL_TABLET | Freq: Every day | ORAL | 0 refills | Status: DC

## 2023-06-17 MED ORDER — CEPHALEXIN 500 MG PO CAPS: 500.0000 mg | ORAL_CAPSULE | Freq: Four times a day (QID) | ORAL | 0 refills | Status: DC

## 2023-06-17 MED ORDER — NYSTATIN 100000 UNIT/GM EX CREA: 1.0000 | TOPICAL_CREAM | Freq: Two times a day (BID) | CUTANEOUS | 1 refills | Status: DC

## 2023-06-21 ENCOUNTER — Other Ambulatory Visit: Payer: Self-pay | Admitting: Internal Medicine

## 2023-06-21 DIAGNOSIS — E109 Type 1 diabetes mellitus without complications: Secondary | ICD-10-CM

## 2023-06-21 DIAGNOSIS — E782 Mixed hyperlipidemia: Secondary | ICD-10-CM

## 2023-06-21 DIAGNOSIS — E1165 Type 2 diabetes mellitus with hyperglycemia: Secondary | ICD-10-CM

## 2023-06-21 MED FILL — Sertraline HCl Tab 100 MG: ORAL | 90 days supply | Qty: 90 | Fill #1 | Status: AC

## 2023-06-21 MED FILL — Amlodipine Besylate Tab 5 MG (Base Equivalent): ORAL | 90 days supply | Qty: 90 | Fill #1 | Status: AC

## 2023-06-22 ENCOUNTER — Other Ambulatory Visit: Payer: Self-pay | Admitting: Internal Medicine

## 2023-06-22 ENCOUNTER — Other Ambulatory Visit: Payer: Self-pay

## 2023-06-22 DIAGNOSIS — E109 Type 1 diabetes mellitus without complications: Secondary | ICD-10-CM

## 2023-06-22 DIAGNOSIS — E782 Mixed hyperlipidemia: Secondary | ICD-10-CM

## 2023-06-22 DIAGNOSIS — E1165 Type 2 diabetes mellitus with hyperglycemia: Secondary | ICD-10-CM

## 2023-06-22 MED ORDER — FLUCONAZOLE 100 MG PO TABS: 100.0000 mg | ORAL_TABLET | Freq: Every day | ORAL | 0 refills | Status: DC

## 2023-06-22 MED FILL — Hydrochlorothiazide Tab 25 MG: ORAL | 90 days supply | Qty: 90 | Fill #0 | Status: AC

## 2023-06-22 MED FILL — Rosuvastatin Calcium Tab 5 MG: ORAL | 90 days supply | Qty: 90 | Fill #0 | Status: AC

## 2023-06-22 MED FILL — Metformin HCl Tab 1000 MG: ORAL | 90 days supply | Qty: 180 | Fill #0 | Status: AC

## 2023-07-12 ENCOUNTER — Other Ambulatory Visit: Payer: Self-pay | Admitting: Internal Medicine

## 2023-07-12 ENCOUNTER — Other Ambulatory Visit: Payer: Self-pay

## 2023-07-12 DIAGNOSIS — G4733 Obstructive sleep apnea (adult) (pediatric): Secondary | ICD-10-CM | POA: Diagnosis not present

## 2023-07-12 DIAGNOSIS — E118 Type 2 diabetes mellitus with unspecified complications: Secondary | ICD-10-CM

## 2023-07-12 MED ORDER — SEMAGLUTIDE (1 MG/DOSE) 4 MG/3ML ~~LOC~~ SOPN
1.0000 mg | PEN_INJECTOR | SUBCUTANEOUS | 5 refills | Status: DC
Start: 2023-07-12 — End: 2024-03-01
  Filled 2023-07-12: qty 3, 28d supply, fill #0
  Filled 2023-08-11: qty 3, 28d supply, fill #1
  Filled 2023-09-15: qty 3, 28d supply, fill #2
  Filled 2023-10-22: qty 3, 28d supply, fill #3
  Filled 2023-12-12: qty 3, 28d supply, fill #4
  Filled 2024-01-14: qty 3, 28d supply, fill #5

## 2023-07-13 ENCOUNTER — Other Ambulatory Visit (HOSPITAL_BASED_OUTPATIENT_CLINIC_OR_DEPARTMENT_OTHER): Payer: Self-pay

## 2023-07-14 ENCOUNTER — Other Ambulatory Visit: Payer: Self-pay

## 2023-07-14 ENCOUNTER — Other Ambulatory Visit: Payer: Self-pay | Admitting: Internal Medicine

## 2023-07-14 DIAGNOSIS — E118 Type 2 diabetes mellitus with unspecified complications: Secondary | ICD-10-CM

## 2023-07-14 MED ORDER — INSULIN GLARGINE-YFGN 100 UNIT/ML ~~LOC~~ SOPN
PEN_INJECTOR | SUBCUTANEOUS | 3 refills | Status: DC
  Filled 2023-07-14: qty 30, 28d supply, fill #0
  Filled 2023-08-11: qty 30, 28d supply, fill #1
  Filled 2023-09-15: qty 30, 28d supply, fill #2
  Filled 2023-10-08: qty 30, 28d supply, fill #3

## 2023-08-11 ENCOUNTER — Inpatient Hospital Stay: Payer: Commercial Managed Care - PPO | Attending: Oncology

## 2023-08-11 ENCOUNTER — Encounter: Payer: Self-pay | Admitting: Oncology

## 2023-08-11 ENCOUNTER — Other Ambulatory Visit: Payer: Self-pay

## 2023-08-11 DIAGNOSIS — E538 Deficiency of other specified B group vitamins: Secondary | ICD-10-CM | POA: Insufficient documentation

## 2023-08-11 DIAGNOSIS — D509 Iron deficiency anemia, unspecified: Secondary | ICD-10-CM | POA: Insufficient documentation

## 2023-08-11 DIAGNOSIS — G4733 Obstructive sleep apnea (adult) (pediatric): Secondary | ICD-10-CM | POA: Diagnosis not present

## 2023-08-11 DIAGNOSIS — N182 Chronic kidney disease, stage 2 (mild): Secondary | ICD-10-CM | POA: Insufficient documentation

## 2023-08-11 DIAGNOSIS — R7989 Other specified abnormal findings of blood chemistry: Secondary | ICD-10-CM

## 2023-08-11 DIAGNOSIS — E1122 Type 2 diabetes mellitus with diabetic chronic kidney disease: Secondary | ICD-10-CM | POA: Insufficient documentation

## 2023-08-11 DIAGNOSIS — I129 Hypertensive chronic kidney disease with stage 1 through stage 4 chronic kidney disease, or unspecified chronic kidney disease: Secondary | ICD-10-CM | POA: Insufficient documentation

## 2023-08-11 DIAGNOSIS — D508 Other iron deficiency anemias: Secondary | ICD-10-CM

## 2023-08-11 LAB — CBC WITH DIFFERENTIAL (CANCER CENTER ONLY)
Abs Immature Granulocytes: 0.04 10*3/uL (ref 0.00–0.07)
Basophils Absolute: 0 10*3/uL (ref 0.0–0.1)
Basophils Relative: 1 %
Eosinophils Absolute: 0.3 10*3/uL (ref 0.0–0.5)
Eosinophils Relative: 5 %
HCT: 34.9 % — ABNORMAL LOW (ref 36.0–46.0)
Hemoglobin: 11.2 g/dL — ABNORMAL LOW (ref 12.0–15.0)
Immature Granulocytes: 1 %
Lymphocytes Relative: 19 %
Lymphs Abs: 1.1 10*3/uL (ref 0.7–4.0)
MCH: 28.1 pg (ref 26.0–34.0)
MCHC: 32.1 g/dL (ref 30.0–36.0)
MCV: 87.7 fL (ref 80.0–100.0)
Monocytes Absolute: 0.5 10*3/uL (ref 0.1–1.0)
Monocytes Relative: 8 %
Neutro Abs: 4 10*3/uL (ref 1.7–7.7)
Neutrophils Relative %: 66 %
Platelet Count: 170 10*3/uL (ref 150–400)
RBC: 3.98 MIL/uL (ref 3.87–5.11)
RDW: 15.1 % (ref 11.5–15.5)
WBC Count: 6 10*3/uL (ref 4.0–10.5)
nRBC: 0 % (ref 0.0–0.2)

## 2023-08-11 LAB — RETIC PANEL
Immature Retic Fract: 18.6 % — ABNORMAL HIGH (ref 2.3–15.9)
RBC.: 3.94 MIL/uL (ref 3.87–5.11)
Retic Count, Absolute: 65 10*3/uL (ref 19.0–186.0)
Retic Ct Pct: 1.7 % (ref 0.4–3.1)
Reticulocyte Hemoglobin: 30 pg (ref >27.9)

## 2023-08-11 LAB — IRON AND TIBC
Iron: 63 ug/dL (ref 28–170)
Saturation Ratios: 18 % (ref 10.4–31.8)
TIBC: 353 ug/dL (ref 250–450)
UIBC: 290 ug/dL

## 2023-08-11 LAB — VITAMIN B12: Vitamin B-12: 716 pg/mL (ref 180–914)

## 2023-08-11 LAB — FERRITIN: Ferritin: 86 ng/mL (ref 11–307)

## 2023-08-11 LAB — TSH: TSH: 5.61 u[IU]/mL — ABNORMAL HIGH (ref 0.350–4.500)

## 2023-08-12 LAB — T4: T4, Total: 5.5 ug/dL (ref 4.5–12.0)

## 2023-08-17 ENCOUNTER — Other Ambulatory Visit: Payer: Self-pay | Admitting: Internal Medicine

## 2023-08-17 DIAGNOSIS — N3000 Acute cystitis without hematuria: Secondary | ICD-10-CM

## 2023-08-17 MED ORDER — NITROFURANTOIN MONOHYD MACRO 100 MG PO CAPS: 100.0000 mg | ORAL_CAPSULE | Freq: Two times a day (BID) | ORAL | 0 refills | Status: AC

## 2023-08-18 ENCOUNTER — Ambulatory Visit: Payer: Commercial Managed Care - PPO | Admitting: Internal Medicine

## 2023-08-18 ENCOUNTER — Other Ambulatory Visit (INDEPENDENT_AMBULATORY_CARE_PROVIDER_SITE_OTHER): Payer: Commercial Managed Care - PPO

## 2023-08-18 ENCOUNTER — Encounter: Payer: Self-pay | Admitting: Oncology

## 2023-08-18 ENCOUNTER — Inpatient Hospital Stay: Payer: Commercial Managed Care - PPO

## 2023-08-18 ENCOUNTER — Inpatient Hospital Stay (HOSPITAL_BASED_OUTPATIENT_CLINIC_OR_DEPARTMENT_OTHER): Payer: Commercial Managed Care - PPO | Admitting: Oncology

## 2023-08-18 VITALS — BP 149/60 | HR 68 | Temp 96.0°F | Ht 65.0 in

## 2023-08-18 VITALS — BP 169/65 | HR 63 | Temp 96.4°F | Resp 17

## 2023-08-18 DIAGNOSIS — R7989 Other specified abnormal findings of blood chemistry: Secondary | ICD-10-CM

## 2023-08-18 DIAGNOSIS — R350 Frequency of micturition: Secondary | ICD-10-CM | POA: Diagnosis not present

## 2023-08-18 DIAGNOSIS — E538 Deficiency of other specified B group vitamins: Secondary | ICD-10-CM

## 2023-08-18 DIAGNOSIS — N182 Chronic kidney disease, stage 2 (mild): Secondary | ICD-10-CM

## 2023-08-18 DIAGNOSIS — D509 Iron deficiency anemia, unspecified: Secondary | ICD-10-CM | POA: Diagnosis not present

## 2023-08-18 DIAGNOSIS — D508 Other iron deficiency anemias: Secondary | ICD-10-CM

## 2023-08-18 DIAGNOSIS — I129 Hypertensive chronic kidney disease with stage 1 through stage 4 chronic kidney disease, or unspecified chronic kidney disease: Secondary | ICD-10-CM | POA: Diagnosis not present

## 2023-08-18 DIAGNOSIS — E1122 Type 2 diabetes mellitus with diabetic chronic kidney disease: Secondary | ICD-10-CM | POA: Diagnosis not present

## 2023-08-18 LAB — POCT URINALYSIS DIPSTICK
Bilirubin, UA: NEGATIVE
Blood, UA: NEGATIVE
Glucose, UA: NEGATIVE
Ketones, UA: NEGATIVE
Leukocytes, UA: NEGATIVE
Nitrite, UA: NEGATIVE
Protein, UA: NEGATIVE
Spec Grav, UA: 1.015 (ref 1.010–1.025)
Urobilinogen, UA: 0.2 U/dL
pH, UA: 5 (ref 5.0–8.0)

## 2023-08-18 MED ORDER — IRON SUCROSE 20 MG/ML IV SOLN
200.0000 mg | Freq: Once | INTRAVENOUS | Status: AC
  Administered 2023-08-18: 200 mg via INTRAVENOUS
  Filled 2023-08-18: qty 10

## 2023-08-18 MED ORDER — SODIUM CHLORIDE 0.9% FLUSH
10.0000 mL | Freq: Once | INTRAVENOUS | Status: AC | PRN
  Administered 2023-08-18: 10 mL
  Filled 2023-08-18: qty 10

## 2023-08-18 NOTE — Progress Notes (Signed)
Hematology/Oncology Consult note [phone] [phone]     REFERRING PROVIDER: Reubin Milan, MD  CHIEF COMPLAINTS/REASON FOR VISIT:  Anemia  ASSESSMENT & PLAN:   Iron deficiency anemia Lab Results  Component Value Date   HGB 11.2 (L) 08/11/2023   TIBC 353 08/11/2023   IRONPCTSAT 18 08/11/2023   FERRITIN 86 08/11/2023    Hemoglobin is stable. In the context of chronic kidney disease, recommend additional 1 dose of Venofer 200 mg today to further increase iron stores.   Goals of ferritin is around 200. No need for erythropoietin therapy given that hemoglobin is above 10.  Patient has difficulty completing colonoscopy prep due to mobility issues. Recommend Cologuard test done annually.   B12 deficiency  continue Vitamin B12 supplementation.  B12 level is stable  Elevated TSH Slightly elevated TSH.  normal T4 was normal.   Recommend patient to follow up with PCP  Orders Placed This Encounter  Procedures   CBC with Differential (Cancer Center Only)    Standing Status:   Future    Standing Expiration Date:   08/17/2024   Iron and TIBC    Standing Status:   Future    Standing Expiration Date:   08/17/2024   Ferritin    Standing Status:   Future    Standing Expiration Date:   08/17/2024   Vitamin B12    Standing Status:   Future    Standing Expiration Date:   08/17/2024   Retic Panel    Standing Status:   Future    Standing Expiration Date:   08/17/2024   Follow up 6 months.   All questions were answered. The patient knows to call the clinic with any problems, questions or concerns.  Rickard Patience, MD, PhD [facility] Hematology Oncology 08/18/2023     HISTORY OF PRESENTING ILLNESS:  Maria Singh is a  72 y.o.  female with PMH listed below who was referred to me for anemia Reviewed patient's recent labs that was done.  She was found to have abnormal CBC on 12/30/22 with Hb 9.8, mcv 80.4, normal wbc and platelet.  Reviewed  patient's previous labs ordered by primary care physician's office, anemia is chronic onset , duration is since at least 2016, baseline level was in 11s in 2023.  She takes oral iron supplementation, recently off.  Mentioned occasional constipation, stating "It's not that bad, but once in a while." She had not noticed any recent bleeding such as epistaxis, hematuria or hematochezia.  Patient has a history of Stage 2 chronic kidney disease with normal creatinine levels, managed by Dr. Cherylann Ratel. Patient reports variable energy levels and is "basically in the wheelchair." Charcot Marie Tooth muscular atrophy  - No chest pain, shortness of breath, or blood in stool reported. Mentioned occasional constipation, stating "It's not that bad, but once in a while.". she is not able to get colonoscopy due to not able to tolerate the prep.  She has taken annual Fecal immunochemical tests    INTERVAL HISTORY Maria Singh is a 72 y.o. female who has above history reviewed by me today presents for follow up visit for anemia. Patient has previously received IV Venofer treatments.  She tolerated well without any side effects. No other new complaints. Urinary incontinence.  She was accompanied by Dr. Yetta Barre.  MEDICAL HISTORY:  Past Medical History:  Diagnosis Date   Anemia    Charcot Marie Tooth muscular atrophy    Chronic kidney disease    Depression  Diabetes mellitus without complication (HCC)    GERD (gastroesophageal reflux disease)    Hyperlipidemia    Hypertension    Rhabdomyolysis 12/06/2019   Rotator cuff injury Nov. 2012   left arm   Sepsis (HCC) 07/16/2018   Sepsis (HCC) 07/16/2018   Sleep apnea     SURGICAL HISTORY: Past Surgical History:  Procedure Laterality Date   arm surgery Left    broken arm   FRACTURE SURGERY  1998 ?   Left arm   HERNIA REPAIR      SOCIAL HISTORY: Social History   Socioeconomic History   Marital status: Married    Spouse name: Not on file    Number of children: Not on file   Years of education: Not on file   Highest education level: Not on file  Occupational History   Not on file  Tobacco Use   Smoking status: Never   Smokeless tobacco: Never  Vaping Use   Vaping status: Never Used  Substance and Sexual Activity   Alcohol use: Not Currently    Alcohol/week: 0.0 standard drinks of alcohol    Comment: very very rare   Drug use: Never   Sexual activity: Not Currently    Birth control/protection: None  Other Topics Concern   Not on file  Social History Narrative   Not on file   Social Determinants of Health   Financial Resource Strain: Low Risk  (03/18/2023)   Overall Financial Resource Strain (CARDIA)    Difficulty of Paying Living Expenses: Not hard at all  Food Insecurity: No Food Insecurity (03/18/2023)   Hunger Vital Sign    Worried About Running Out of Food in the Last Year: Never true    Ran Out of Food in the Last Year: Never true  Transportation Needs: No Transportation Needs (03/18/2023)   PRAPARE - Administrator, Civil Service (Medical): No    Lack of Transportation (Non-Medical): No  Physical Activity: Inactive (03/18/2023)   Exercise Vital Sign    Days of Exercise per Week: 0 days    Minutes of Exercise per Session: 0 min  Stress: No Stress Concern Present (03/18/2023)   [employer] of Occupational Health - Occupational Stress Questionnaire    Feeling of Stress : Only a little  Social Connections: Moderately Isolated (03/18/2023)   Social Connection and Isolation Panel [NHANES]    Frequency of Communication with Friends and Family: More than three times a week    Frequency of Social Gatherings with Friends and Family: Three times a week    Attends Religious Services: Never    Active Member of Clubs or Organizations: No    Attends Banker Meetings: Never    Marital Status: Married  Catering manager Violence: Not At Risk (03/18/2023)   Humiliation, Afraid, Rape, and Kick  questionnaire    Fear of Current or Ex-Partner: No    Emotionally Abused: No    Physically Abused: No    Sexually Abused: No    FAMILY HISTORY: Family History  Problem Relation Age of Onset   Cancer Mother    Heart disease Father    Diabetes Maternal Grandmother    Hypertension Maternal Grandmother    Obesity Maternal Grandmother    Heart disease Paternal Grandfather    Breast cancer Maternal Aunt        mat great aunt    ALLERGIES:  is allergic to metronidazole and related and sulfa antibiotics.  MEDICATIONS:  Current Outpatient Medications  Medication Sig  Dispense Refill   Accu-Chek FastClix Lancets MISC TEST ONCE DAILY 102 each 2   acetaminophen (TYLENOL) 500 MG tablet Take 500 mg by mouth every 6 (six) hours as needed.     amLODipine (NORVASC) 5 MG tablet Take 1 tablet (5 mg total) by mouth daily. 90 tablet 3   aspirin 81 MG tablet Take 81 mg by mouth daily.     blood glucose meter kit and supplies KIT Dispense based on patient and insurance preference. Use up to four times daily as directed. 1 each 0   Cholecalciferol (VITAMIN D) 50 MCG (2000 UT) tablet Take 2,000 Units by mouth daily.     clotrimazole-betamethasone (LOTRISONE) cream Apply 1 application topically 2 (two) times daily. (Patient taking differently: Apply 1 application  topically as needed.) 30 g 1   Continuous Glucose Sensor (FREESTYLE LIBRE 3 SENSOR) MISC 1 each by Does not apply route every 14 (fourteen) days. Use to check glucose continuously 6 each 3   cyanocobalamin (VITAMIN B12) 1000 MCG tablet Take 1 tablet (1,000 mcg total) by mouth daily. 90 tablet 0   diclofenac Sodium (VOLTAREN) 1 % GEL Apply 2 g topically 4 (four) times daily. To affected joint. 100 g 1   docusate sodium (COLACE) 100 MG capsule Take 100 mg by mouth daily.      Famotidine (PEPCID PO) Take by mouth daily. Dr. Conception Oms     fluconazole (DIFLUCAN) 100 MG tablet Take 1 tablet (100 mg total) by mouth daily. 3 tablet 0   glucose blood  (FREESTYLE LITE) test strip Use up to four times daily as directed 100 each 0   hydrochlorothiazide (HYDRODIURIL) 25 MG tablet Take 1 tablet (25 mg total) by mouth daily. 90 tablet 3   ibuprofen (ADVIL) 800 MG tablet Take 1 tablet (800 mg total) by mouth every 8 (eight) hours as needed. 100 tablet 1   icosapent Ethyl (VASCEPA) 1 g capsule Take 2 capsules (2 g total) by mouth 2 (two) times daily. 360 capsule 3   insulin glargine-yfgn (SEMGLEE, YFGN,) 100 UNIT/ML Pen Inject 60 Units into the skin every morning AND 50 Units every evening. 30 mL 3   Insulin Pen Needle (UNIFINE PENTIPS) 31G X 6 MM MISC Use as directed 3 times a day 100 each 0   metFORMIN (GLUCOPHAGE) 1000 MG tablet Take 1 tablet (1,000 mg total) by mouth 2 (two) times daily. 180 tablet 3   nitrofurantoin, macrocrystal-monohydrate, (MACROBID) 100 MG capsule Take 1 capsule (100 mg total) by mouth 2 (two) times daily for 7 days. 14 capsule 0   NON FORMULARY Auto CPap nightly     nystatin cream (MYCOSTATIN) Apply 1 Application topically 2 (two) times daily. 30 g 1   patiromer (VELTASSA) 8.4 g packet Take 1 packet (8.4 g) by mouth one time each day 30 each 11   rosuvastatin (CRESTOR) 5 MG tablet Take 1 tablet (5 mg total) by mouth daily. 90 tablet 3   Semaglutide, 1 MG/DOSE, 4 MG/3ML SOPN Inject 1 mg into the skin once a week. 3 mL 5   sertraline (ZOLOFT) 100 MG tablet Take 1 tablet (100 mg total) by mouth daily. 90 tablet 3   sodium bicarbonate 650 MG tablet Take 1 tablet (650 mg total) by mouth in the morning and at bedtime. 180 tablet 1   triamcinolone cream (KENALOG) 0.1 % Apply 1 Application topically daily as needed. Qd up to 5 days a week top of feet until clear, avoid face, groin, axilla 80 g 0  ferrous sulfate 325 (65 FE) MG tablet Take 1 tablet (325 mg total) by mouth daily with breakfast. (Patient not taking: Reported on 03/18/2023) 90 tablet 1   Glucagon (GVOKE HYPOPEN 1-PACK) 1 MG/0.2ML SOAJ Inject 1 each into the skin daily as  needed. (Patient not taking: Reported on 05/05/2023) 0.4 mL 0   loratadine (CLARITIN) 10 MG tablet Take 10 mg by mouth daily. (Patient not taking: Reported on 05/05/2023)     mupirocin ointment (BACTROBAN) 2 % Apply daily to open sores on legs/feet until resolved (Patient not taking: Reported on 05/05/2023) 22 g 12   ondansetron (ZOFRAN) 4 MG tablet Take 1-2 tablets (4-8 mg total) by mouth every 8 (eight) hours as needed for nausea or vomiting. (Patient not taking: Reported on 05/05/2023) 40 tablet 1   No current facility-administered medications for this visit.    Review of Systems  Constitutional:  Positive for fatigue. Negative for appetite change, chills, fever and unexpected weight change.  HENT:   Negative for hearing loss and voice change.   Eyes:  Negative for eye problems.  Respiratory:  Negative for chest tightness, cough and shortness of breath.   Cardiovascular:  Negative for chest pain.  Gastrointestinal:  Negative for abdominal distention, abdominal pain, blood in stool and constipation.       Occasional constipation  Genitourinary:  Negative for difficulty urinating.   Musculoskeletal:  Positive for arthralgias.  Skin:  Negative for itching and rash.  Neurological:  Positive for extremity weakness.  Hematological:  Negative for adenopathy.  Psychiatric/Behavioral:  Negative for confusion.     PHYSICAL EXAMINATION: Vitals:   08/18/23 1328  BP: (!) 149/60  Pulse: 68  Temp: (!) 96 F (35.6 C)  SpO2: 98%   There were no vitals filed for this visit.  Physical Exam Constitutional:      General: She is not in acute distress.    Appearance: She is obese.     Comments: Patient sits in the wheelchair  HENT:     Head: Normocephalic and atraumatic.  Eyes:     General: No scleral icterus. Cardiovascular:     Rate and Rhythm: Normal rate and regular rhythm.  Pulmonary:     Effort: Pulmonary effort is normal. No respiratory distress.     Breath sounds: No wheezing.   Abdominal:     General: Bowel sounds are normal. There is no distension.     Palpations: Abdomen is soft.  Musculoskeletal:     Cervical back: Normal range of motion and neck supple.  Lymphadenopathy:     Cervical: No cervical adenopathy.  Skin:    General: Skin is warm.  Neurological:     Mental Status: She is alert and oriented to person, place, and time. Mental status is at baseline.  Psychiatric:        Mood and Affect: Mood normal.      LABORATORY DATA:  I have reviewed the data as listed    Latest Ref Rng & Units 08/11/2023   11:29 AM 04/28/2023   11:08 AM 01/20/2023    4:04 PM  CBC  WBC 4.0 - 10.5 K/uL 6.0  6.3  7.1   Hemoglobin 12.0 - 15.0 g/dL 40.9  81.1  91.4   Hematocrit 36.0 - 46.0 % 34.9  35.0  33.4   Platelets 150 - 400 K/uL 170  171  224       Latest Ref Rng & Units 01/20/2023    4:04 PM 02/09/2022    2:16 PM  01/15/2022   12:00 AM  CMP  Glucose 70 - 99 mg/dL 623  72    BUN 8 - 23 mg/dL 39  36  40      Creatinine 0.44 - 1.00 mg/dL 7.62  8.31  0.8      Sodium 135 - 145 mmol/L 139  137  144      Potassium 3.5 - 5.1 mmol/L 4.9  5.1  4.9      Chloride 98 - 111 mmol/L 105  107  111      CO2 22 - 32 mmol/L 23  23  17       Calcium 8.9 - 10.3 mg/dL 9.3  9.5  9.8      Total Protein 6.5 - 8.1 g/dL 7.4  7.5    Total Bilirubin 0.3 - 1.2 mg/dL 0.3  0.2    Alkaline Phos 38 - 126 U/L 26  25    AST 15 - 41 U/L 26  28    ALT 0 - 44 U/L 22  31       This result is from an external source.       Lab Results  Component Value Date   FERRITIN 86 08/11/2023   IRONPCTSAT 18 08/11/2023   TIBC 353 08/11/2023   IRON 63 08/11/2023      RADIOGRAPHIC STUDIES: I have personally reviewed the radiological images as listed and agreed with the findings in the report. No results found.

## 2023-08-18 NOTE — Assessment & Plan Note (Addendum)
Lab Results  Component Value Date   HGB 11.2 (L) 08/11/2023   TIBC 353 08/11/2023   IRONPCTSAT 18 08/11/2023   FERRITIN 86 08/11/2023    Hemoglobin is stable. In the context of chronic kidney disease, recommend additional 1 dose of Venofer 200 mg today to further increase iron stores.   Goals of ferritin is around 200. No need for erythropoietin therapy given that hemoglobin is above 10.  Patient has difficulty completing colonoscopy prep due to mobility issues. Recommend Cologuard test done annually.

## 2023-08-18 NOTE — Assessment & Plan Note (Addendum)
continue Vitamin B12 supplementation.  B12 level is stable

## 2023-08-18 NOTE — Assessment & Plan Note (Signed)
Slightly elevated TSH.  normal T4 was normal.   Recommend patient to follow up with PCP

## 2023-08-20 LAB — URINE CULTURE

## 2023-08-25 ENCOUNTER — Ambulatory Visit: Payer: Medicare Other

## 2023-08-31 ENCOUNTER — Other Ambulatory Visit: Payer: Self-pay

## 2023-08-31 ENCOUNTER — Telehealth: Payer: Self-pay

## 2023-08-31 NOTE — Telephone Encounter (Signed)
Patient called into the office to clarify Rx. Patient said she has only been taking 0.25 mg once weekly since she started the medication. BS is controlled. Patient picked up Rx and received 1 mg instead of 0.25 for her Ozempic.  Patient spoke with Dr Judithann Graves over the phone and they agreed she should continue 0.25 weekly since her BS is good.  Patient will continue this.  - Phenix Grein

## 2023-09-06 ENCOUNTER — Other Ambulatory Visit: Payer: Self-pay

## 2023-09-06 DIAGNOSIS — R21 Rash and other nonspecific skin eruption: Secondary | ICD-10-CM

## 2023-09-06 MED ORDER — NYSTATIN 100000 UNIT/GM EX CREA: 1.0000 | TOPICAL_CREAM | Freq: Two times a day (BID) | CUTANEOUS | 1 refills | Status: DC

## 2023-09-07 ENCOUNTER — Other Ambulatory Visit: Payer: Self-pay | Admitting: Internal Medicine

## 2023-09-07 ENCOUNTER — Other Ambulatory Visit: Payer: Self-pay

## 2023-09-07 MED ORDER — NYSTATIN 100000 UNIT/GM EX POWD: 1.0000 | Freq: Three times a day (TID) | CUTANEOUS | 0 refills | Status: DC

## 2023-09-07 MED ORDER — VELTASSA 8.4 G PO PACK
8.4000 g | PACK | Freq: Every day | ORAL | 11 refills | Status: AC
  Filled 2023-09-07 – 2023-09-15 (×2): qty 30, 30d supply, fill #0
  Filled 2023-11-12: qty 30, 30d supply, fill #1
  Filled 2023-12-18 – 2023-12-31 (×2): qty 30, 30d supply, fill #2
  Filled 2024-01-30: qty 30, 30d supply, fill #3
  Filled 2024-02-27: qty 30, 30d supply, fill #4

## 2023-09-08 ENCOUNTER — Other Ambulatory Visit: Payer: Self-pay

## 2023-09-08 ENCOUNTER — Encounter: Payer: Self-pay | Admitting: Oncology

## 2023-09-08 NOTE — Telephone Encounter (Signed)
Requested by interface surescripts. Duplicate request. Receipt confirmed by pharmacy on 09/07/23 at 10:54 am . Patient already picked up Rx last night per pharmacy staff.  Requested Prescriptions  Refused Prescriptions Disp Refills   NYSTATIN powder [Pharmacy Med Name: NYSTOP 100000 POWDER] 15 g 0    Sig: APPLY ONE (1) APPLICATION TOPICALLY 3 TIMES DAILY     Off-Protocol Failed - 09/07/2023 10:22 AM      Failed - Medication not assigned to a protocol, review manually.      Passed - Valid encounter within last 12 months    Recent Outpatient Visits           9 months ago Extensor carpi ulnaris tendinitis   [place] [facility], Ocie Bob, MD   1 year ago Toenail avulsion, initial encounter   [facility] at [facility], Ocie Bob, MD   1 year ago Extensor carpi ulnaris tendinitis   [place] [facility], Ocie Bob, MD   1 year ago Essential hypertension   [place] [facility] at [facility], Nyoka Cowden, MD   2 years ago Type II diabetes mellitus with complication [facility])    Primary Care & Sports Medicine at [facility], Nyoka Cowden, MD

## 2023-09-08 NOTE — Telephone Encounter (Signed)
Called pharmacy to review /verify medication request. Receipt confirmed by pharmacy on 09/07/23 at 10:54 am and pharmacy staff reports patient picked up Rx last night.

## 2023-09-11 DIAGNOSIS — G4733 Obstructive sleep apnea (adult) (pediatric): Secondary | ICD-10-CM | POA: Diagnosis not present

## 2023-09-15 ENCOUNTER — Other Ambulatory Visit: Payer: Self-pay

## 2023-09-15 ENCOUNTER — Other Ambulatory Visit: Payer: Self-pay | Admitting: Internal Medicine

## 2023-09-15 DIAGNOSIS — E118 Type 2 diabetes mellitus with unspecified complications: Secondary | ICD-10-CM

## 2023-09-15 MED ORDER — COMFORT EZ PEN NEEDLES 31G X 6 MM MISC
0 refills | Status: DC
  Filled 2023-09-15: qty 100, 30d supply, fill #0

## 2023-09-15 MED ORDER — GVOKE HYPOPEN 2-PACK 1 MG/0.2ML ~~LOC~~ SOAJ
1.0000 | Freq: Every day | SUBCUTANEOUS | 0 refills | Status: DC | PRN
  Filled 2023-09-15: qty 0.4, 30d supply, fill #0

## 2023-09-15 MED FILL — Sertraline HCl Tab 100 MG: ORAL | 90 days supply | Qty: 90 | Fill #2 | Status: AC

## 2023-09-15 MED FILL — Rosuvastatin Calcium Tab 5 MG: ORAL | 90 days supply | Qty: 90 | Fill #1 | Status: AC

## 2023-09-15 MED FILL — Hydrochlorothiazide Tab 25 MG: ORAL | 90 days supply | Qty: 90 | Fill #1 | Status: AC

## 2023-09-15 MED FILL — Amlodipine Besylate Tab 5 MG (Base Equivalent): ORAL | 90 days supply | Qty: 90 | Fill #2 | Status: AC

## 2023-09-15 NOTE — Telephone Encounter (Signed)
Requested Prescriptions  Pending Prescriptions Disp Refills   Glucagon (GVOKE HYPOPEN 2-PACK) 1 MG/0.2ML SOAJ 0.4 mL 0    Sig: Inject 1 each into the skin daily as needed.     Off-Protocol Failed - 09/15/2023  1:38 PM      Failed - Medication not assigned to a protocol, review manually.      Passed - Valid encounter within last 12 months    Recent Outpatient Visits           9 months ago Extensor carpi ulnaris tendinitis   [place] [facility], Ocie Bob, MD   1 year ago Toenail avulsion, initial encounter   [facility] at [facility], Ocie Bob, MD   1 year ago Extensor carpi ulnaris tendinitis   [place] [facility], Ocie Bob, MD   1 year ago Essential hypertension   [place] [facility] at [facility], Nyoka Cowden, MD   2 years ago Type II diabetes mellitus with complication [facility])   [place] [facility] at [facility], Nyoka Cowden, MD               Insulin Pen Needle (COMFORT EZ PEN NEEDLES) 31G X 6 MM MISC 100 each 0    Sig: Use as directed 3 times a day     Endocrinology: Diabetes - Testing Supplies Passed - 09/15/2023  1:38 PM      Passed - Valid encounter within last 12 months    Recent Outpatient Visits           9 months ago Extensor carpi ulnaris tendinitis   [place] [facility], Ocie Bob, MD   1 year ago Toenail avulsion, initial encounter   [facility] at [facility], Ocie Bob, MD   1 year ago Extensor carpi ulnaris tendinitis   [place] [facility], Ocie Bob, MD   1 year ago Essential hypertension   [place] [facility] at [facility], Nyoka Cowden, MD   2 years ago Type II diabetes  mellitus with complication [facility])   [place] [facility] at [facility], Nyoka Cowden, MD

## 2023-09-17 ENCOUNTER — Other Ambulatory Visit: Payer: Self-pay

## 2023-10-08 ENCOUNTER — Other Ambulatory Visit: Payer: Self-pay

## 2023-10-11 DIAGNOSIS — G4733 Obstructive sleep apnea (adult) (pediatric): Secondary | ICD-10-CM | POA: Diagnosis not present

## 2023-10-22 ENCOUNTER — Other Ambulatory Visit: Payer: Self-pay

## 2023-10-22 ENCOUNTER — Other Ambulatory Visit: Payer: Self-pay | Admitting: Internal Medicine

## 2023-10-22 MED FILL — Metformin HCl Tab 1000 MG: ORAL | 90 days supply | Qty: 180 | Fill #1 | Status: AC

## 2023-10-24 ENCOUNTER — Other Ambulatory Visit: Payer: Self-pay | Admitting: Internal Medicine

## 2023-10-24 ENCOUNTER — Other Ambulatory Visit: Payer: Self-pay

## 2023-10-26 ENCOUNTER — Other Ambulatory Visit: Payer: Self-pay

## 2023-10-26 NOTE — Telephone Encounter (Signed)
 Requested medication (s) are due for refill today: yes, pharmacy requesting refill  Requested medication (s) are on the active medication list: yes  Last refill:  05/11/23 #180/1  Future visit scheduled: no  Notes to clinic:  Unable to refill per protocol due to failed labs, no updated results.      Requested Prescriptions  Pending Prescriptions Disp Refills   sodium bicarbonate  650 MG tablet 180 tablet 1    Sig: Take 1 tablet (650 mg total) by mouth in the morning and at bedtime.     Endocrinology:  Minerals 2 Failed - 10/26/2023  5:27 PM      Failed - Mg Level in normal range and within 180 days    Magnesium  Date Value Ref Range Status  12/08/2019 1.8 1.7 - 2.4 mg/dL Final    Comment:    Performed at [facility], [address] [place], [place] [address]         Failed - BMP within normal limits in the last 6 months    Glucose  Date Value Ref Range Status  08/11/2013 152 (H) 65 - 99 mg/dL Final   Glucose, Bld  Date Value Ref Range Status  01/20/2023 119 (H) 70 - 99 mg/dL Final    Comment:    Glucose reference range applies only to samples taken after fasting for at least 8 hours.   Glucose-Capillary  Date Value Ref Range Status  12/08/2019 130 (H) 70 - 99 mg/dL Final   Calcium   Date Value Ref Range Status  01/20/2023 9.3 8.9 - 10.3 mg/dL Final   Calcium , Total  Date Value Ref Range Status  08/11/2013 8.4 (L) 8.5 - 10.1 mg/dL Final   Sodium  Date Value Ref Range Status  01/20/2023 139 135 - 145 mmol/L Final  01/15/2022 144 137 - 147 Final  08/11/2013 144 136 - 145 mmol/L Final   Potassium  Date Value Ref Range Status  01/20/2023 4.9 3.5 - 5.1 mmol/L Final  08/11/2013 5.7 (H) 3.5 - 5.1 mmol/L Final   Chloride  Date Value Ref Range Status  01/20/2023 105 98 - 111 mmol/L Final  08/11/2013 116 (H) 98 - 107 mmol/L Final   BUN  Date Value Ref Range Status  01/20/2023 39 (H) 8 - 23 mg/dL Final  96/69/7976 40 (A) 4 - 21 Final  08/11/2013 12 7  - 18 mg/dL Final   Creatinine  Date Value Ref Range Status  08/11/2013 1.08 0.60 - 1.30 mg/dL Final   Creatinine, Ser  Date Value Ref Range Status  01/20/2023 1.14 (H) 0.44 - 1.00 mg/dL Final   Creatinine, Urine  Date Value Ref Range Status  12/07/2019 21 mg/dL Final    Comment:    Performed at [facility], [address] [place], [place] [address]   CO2  Date Value Ref Range Status  01/20/2023 23 22 - 32 mmol/L Final   Co2  Date Value Ref Range Status  08/11/2013 22 21 - 32 mmol/L Final   EGFR (African American)  Date Value Ref Range Status  08/11/2013 >60  Final   GFR calc Af Amer  Date Value Ref Range Status  10/01/2020 75 >59 mL/min/1.73 Final    Comment:    **In accordance with recommendations from the NKF-ASN Task force,**   [facility] is in the process of updating its eGFR calculation to the   2021 CKD-EPI creatinine equation that estimates kidney function   without a race variable.    eGFR  Date  Value Ref Range Status  01/15/2022 75  Final   EGFR (Non-African Amer.)  Date Value Ref Range Status  08/11/2013 55 (L)  Final    Comment:    eGFR values <79mL/min/1.73 m2 may be an indication of chronic kidney disease (CKD). Calculated eGFR is useful in patients with stable renal function. The eGFR calculation will not be reliable in acutely ill patients when serum creatinine is changing rapidly. It is not useful in  patients on dialysis. The eGFR calculation may not be applicable to patients at the low and high extremes of body sizes, pregnant women, and vegetarians.    GFR, Estimated  Date Value Ref Range Status  01/20/2023 52 (L) >60 mL/min Final    Comment:    (NOTE) Calculated using the CKD-EPI Creatinine Equation (2021)          Passed - Valid encounter within last 12 months    Recent Outpatient Visits           10 months ago Extensor carpi ulnaris tendinitis   [place] [facility]  [facility]via, Selinda PARAS, MD   1 year ago Toenail avulsion, initial encounter   [facility] at [facility], Selinda PARAS, MD   1 year ago Extensor carpi ulnaris tendinitis   [place] [facility], Selinda PARAS, MD   1 year ago Essential hypertension   [place] [facility] at [facility], Leita DEL, MD   3 years ago Type II diabetes mellitus with complication [facility])   [place] [facility] at [facility], Leita DEL, MD

## 2023-10-27 ENCOUNTER — Other Ambulatory Visit: Payer: Self-pay

## 2023-11-09 ENCOUNTER — Other Ambulatory Visit: Payer: Self-pay

## 2023-11-11 DIAGNOSIS — G4733 Obstructive sleep apnea (adult) (pediatric): Secondary | ICD-10-CM | POA: Diagnosis not present

## 2023-11-12 ENCOUNTER — Other Ambulatory Visit: Payer: Self-pay | Admitting: Internal Medicine

## 2023-11-12 ENCOUNTER — Other Ambulatory Visit: Payer: Self-pay

## 2023-11-12 DIAGNOSIS — E118 Type 2 diabetes mellitus with unspecified complications: Secondary | ICD-10-CM

## 2023-11-12 MED FILL — Ibuprofen Tab 800 MG: ORAL | 34 days supply | Qty: 100 | Fill #1 | Status: AC

## 2023-11-12 NOTE — Telephone Encounter (Signed)
Requested medication (s) are due for refill today: yes  Requested medication (s) are on the active medication list: yes  Last refill:  07/14/23 #30/3, sodium bicarb 05/11/23 #180/1  Future visit scheduled: yes  Notes to clinic:  Unable to refill per protocol, medication not assigned to the refill protocol.      Requested Prescriptions  Pending Prescriptions Disp Refills   SEMGLEE, YFGN, 100 UNIT/ML Pen [Pharmacy Med Name: insulin glargine-yfgn (SEMGLEE, YFGN,) 100 UNIT/ML Pen] 30 mL 3    Sig: Inject 60 Units into the skin every morning AND 50 Units every evening.     Off-Protocol Failed - 11/12/2023  5:20 PM      Failed - Medication not assigned to a protocol, review manually.      Passed - Valid encounter within last 12 months    Recent Outpatient Visits           11 months ago Extensor carpi ulnaris tendinitis   [place] [facility] [place], Ocie Bob, MD   1 year ago Toenail avulsion, initial encounter   [facility] at [facility], Ocie Bob, MD   1 year ago Extensor carpi ulnaris tendinitis   [place] [facility] at [facility], Ocie Bob, MD   1 year ago Essential hypertension   [place] [facility] at [facility], Nyoka Cowden, MD   3 years ago Type II diabetes mellitus with complication [facility])   [place] [facility] at [facility], Nyoka Cowden, MD       Future Appointments             In 1 month Reubin Milan, MD [facility] at [facility] [place], [facility]             sodium bicarbonate 650 MG tablet 180 tablet 1    Sig: Take 1 tablet (650 mg total) by mouth in the morning and at bedtime.     Endocrinology:  Minerals 2 Failed - 11/12/2023  5:20 PM      Failed - Mg Level in normal range and within 180 days    Magnesium  Date Value Ref Range Status   12/08/2019 1.8 1.7 - 2.4 mg/dL Final    Comment:    Performed at [facility], [address] [place], [place] [address]         Failed - BMP within normal limits in the last 6 months    Glucose  Date Value Ref Range Status  08/11/2013 152 (H) 65 - 99 mg/dL Final   Glucose, Bld  Date Value Ref Range Status  01/20/2023 119 (H) 70 - 99 mg/dL Final    Comment:    Glucose reference range applies only to samples taken after fasting for at least 8 hours.   Glucose-Capillary  Date Value Ref Range Status  12/08/2019 130 (H) 70 - 99 mg/dL Final   Calcium  Date Value Ref Range Status  01/20/2023 9.3 8.9 - 10.3 mg/dL Final   Calcium, Total  Date Value Ref Range Status  08/11/2013 8.4 (L) 8.5 - 10.1 mg/dL Final   Sodium  Date Value Ref Range Status  01/20/2023 139 135 - 145 mmol/L Final  01/15/2022 144 137 - 147 Final  08/11/2013 144 136 - 145 mmol/L Final   Potassium  Date Value Ref Range Status  01/20/2023  4.9 3.5 - 5.1 mmol/L Final  08/11/2013 5.7 (H) 3.5 - 5.1 mmol/L Final   Chloride  Date Value Ref Range Status  01/20/2023 105 98 - 111 mmol/L Final  08/11/2013 116 (H) 98 - 107 mmol/L Final   BUN  Date Value Ref Range Status  01/20/2023 39 (H) 8 - 23 mg/dL Final  16/07/9603 40 (A) 4 - 21 Final  08/11/2013 12 7 - 18 mg/dL Final   Creatinine  Date Value Ref Range Status  08/11/2013 1.08 0.60 - 1.30 mg/dL Final   Creatinine, Ser  Date Value Ref Range Status  01/20/2023 1.14 (H) 0.44 - 1.00 mg/dL Final   Creatinine, Urine  Date Value Ref Range Status  12/07/2019 21 mg/dL Final    Comment:    Performed at [facility], [address] [place], [place] [address]   CO2  Date Value Ref Range Status  01/20/2023 23 22 - 32 mmol/L Final   Co2  Date Value Ref Range Status  08/11/2013 22 21 - 32 mmol/L Final   EGFR (African American)  Date Value Ref Range Status  08/11/2013 >60  Final   GFR calc Af Amer  Date Value Ref Range Status   10/01/2020 75 >59 mL/min/1.73 Final    Comment:    **In accordance with recommendations from the NKF-ASN Task force,**   [facility] is in the process of updating its eGFR calculation to the   2021 CKD-EPI creatinine equation that estimates kidney function   without a race variable.    eGFR  Date Value Ref Range Status  01/15/2022 75  Final   EGFR (Non-African Amer.)  Date Value Ref Range Status  08/11/2013 55 (L)  Final    Comment:    eGFR values <70mL/min/1.73 m2 may be an indication of chronic kidney disease (CKD). Calculated eGFR is useful in patients with stable renal function. The eGFR calculation will not be reliable in acutely ill patients when serum creatinine is changing rapidly. It is not useful in  patients on dialysis. The eGFR calculation may not be applicable to patients at the low and high extremes of body sizes, pregnant women, and vegetarians.    GFR, Estimated  Date Value Ref Range Status  01/20/2023 52 (L) >60 mL/min Final    Comment:    (NOTE) Calculated using the CKD-EPI Creatinine Equation (2021)          Passed - Valid encounter within last 12 months    Recent Outpatient Visits           11 months ago Extensor carpi ulnaris tendinitis   [place] [facility], Ocie Bob, MD   1 year ago Toenail avulsion, initial encounter   [facility], Ocie Bob, MD   1 year ago Extensor carpi ulnaris tendinitis   [place] [facility] at [facility], Ocie Bob, MD   1 year ago Essential hypertension   [place] [facility] at [facility], Nyoka Cowden, MD   3 years ago Type II diabetes mellitus with complication [facility])   [place] [facility] at [facility], Nyoka Cowden, MD       Future Appointments             In 1 month Judithann Graves, Nyoka Cowden, MD [facility]  Primary Care & Sports Medicine at [facility], [facility]

## 2023-11-14 ENCOUNTER — Other Ambulatory Visit: Payer: Self-pay

## 2023-11-15 ENCOUNTER — Other Ambulatory Visit: Payer: Self-pay

## 2023-11-15 ENCOUNTER — Encounter: Payer: Self-pay | Admitting: Oncology

## 2023-11-15 MED FILL — Insulin Glargine-yfgn Soln Pen-Injector 100 Unit/ML: SUBCUTANEOUS | 28 days supply | Qty: 30 | Fill #0 | Status: AC

## 2023-11-15 MED FILL — Sodium Bicarbonate Tab 650 MG: ORAL | 90 days supply | Qty: 180 | Fill #0 | Status: AC

## 2023-11-16 ENCOUNTER — Other Ambulatory Visit: Payer: Self-pay

## 2023-11-17 DIAGNOSIS — D631 Anemia in chronic kidney disease: Secondary | ICD-10-CM | POA: Diagnosis not present

## 2023-11-17 DIAGNOSIS — I10 Essential (primary) hypertension: Secondary | ICD-10-CM | POA: Diagnosis not present

## 2023-11-17 DIAGNOSIS — R809 Proteinuria, unspecified: Secondary | ICD-10-CM | POA: Diagnosis not present

## 2023-11-17 DIAGNOSIS — N182 Chronic kidney disease, stage 2 (mild): Secondary | ICD-10-CM | POA: Diagnosis not present

## 2023-11-17 DIAGNOSIS — E1122 Type 2 diabetes mellitus with diabetic chronic kidney disease: Secondary | ICD-10-CM | POA: Diagnosis not present

## 2023-11-17 DIAGNOSIS — E875 Hyperkalemia: Secondary | ICD-10-CM | POA: Diagnosis not present

## 2023-11-17 LAB — BASIC METABOLIC PANEL
BUN: 34 — AB (ref 4–21)
CO2: 20 (ref 13–22)
Chloride: 108 (ref 99–108)
Creatinine: 1 (ref 0.5–1.1)
Glucose: 121
Potassium: 4.4 meq/L (ref 3.5–5.1)
Sodium: 141 (ref 137–147)

## 2023-11-17 LAB — MICROALBUMIN / CREATININE URINE RATIO: Microalb Creat Ratio: 459

## 2023-11-17 LAB — COMPREHENSIVE METABOLIC PANEL: eGFR: 62

## 2023-11-17 LAB — CBC AND DIFFERENTIAL
HCT: 33 — AB (ref 36–46)
Hemoglobin: 11.1 — AB (ref 12.0–16.0)
Platelets: 194 10*3/uL (ref 150–400)
WBC: 7.1

## 2023-11-17 LAB — PROTEIN / CREATININE RATIO, URINE: Creatinine, Urine: 61

## 2023-11-17 LAB — MICROALBUMIN, URINE: Microalb, Ur: 28

## 2023-12-07 ENCOUNTER — Other Ambulatory Visit: Payer: Self-pay | Admitting: Internal Medicine

## 2023-12-07 ENCOUNTER — Telehealth: Payer: Self-pay

## 2023-12-07 DIAGNOSIS — L02519 Cutaneous abscess of unspecified hand: Secondary | ICD-10-CM

## 2023-12-07 MED ORDER — AMOXICILLIN-POT CLAVULANATE 875-125 MG PO TABS: 1.0000 | ORAL_TABLET | Freq: Two times a day (BID) | ORAL | 0 refills | Status: AC

## 2023-12-07 NOTE — Telephone Encounter (Signed)
 Patient informed.

## 2023-12-07 NOTE — Telephone Encounter (Signed)
 Patient stated she has a new cat that scratched and bit her hand while playing yesterday. She has applied abx ointment, and cleaned the area. She wants to know if you think she should take abx, and if so she will need those and diflucan to [facility] [place].  - Branston Halsted

## 2023-12-12 ENCOUNTER — Other Ambulatory Visit: Payer: Self-pay | Admitting: Internal Medicine

## 2023-12-12 DIAGNOSIS — E118 Type 2 diabetes mellitus with unspecified complications: Secondary | ICD-10-CM

## 2023-12-13 ENCOUNTER — Other Ambulatory Visit: Payer: Self-pay | Admitting: Internal Medicine

## 2023-12-13 ENCOUNTER — Other Ambulatory Visit: Payer: Self-pay

## 2023-12-13 DIAGNOSIS — E118 Type 2 diabetes mellitus with unspecified complications: Secondary | ICD-10-CM

## 2023-12-14 ENCOUNTER — Telehealth: Payer: Self-pay

## 2023-12-14 ENCOUNTER — Other Ambulatory Visit: Payer: Self-pay | Admitting: Internal Medicine

## 2023-12-14 ENCOUNTER — Other Ambulatory Visit: Payer: Self-pay

## 2023-12-14 DIAGNOSIS — B3731 Acute candidiasis of vulva and vagina: Secondary | ICD-10-CM

## 2023-12-14 MED ORDER — FLUCONAZOLE 100 MG PO TABS: 100.0000 mg | ORAL_TABLET | Freq: Every day | ORAL | 0 refills | Status: DC

## 2023-12-14 NOTE — Telephone Encounter (Signed)
 Patient informed.

## 2023-12-14 NOTE — Telephone Encounter (Signed)
 Requested medications are due for refill today.  yes  Requested medications are on the active medications list.  yes  Last refill. 12/29/2023 #6 3 rf  Future visit scheduled.   yes  Notes to clinic.  Protocol not attached - please review for refill.    Requested Prescriptions  Pending Prescriptions Disp Refills   Continuous Glucose Sensor (FREESTYLE LIBRE 3 SENSOR) MISC [Pharmacy Med Name: Continuous Glucose Sensor (FREESTYLE LIBRE 3 SENSOR) Misc] 6 each 3    Sig: 1 each by Does not apply route every 14 (fourteen) days. Use to check glucose continuously     There is no refill protocol information for this order

## 2023-12-14 NOTE — Telephone Encounter (Signed)
 Patient requesting Diflucan to be sent to [facility] for yeast infection due to abx.  Please advise. Thank you.

## 2023-12-15 ENCOUNTER — Other Ambulatory Visit: Payer: Self-pay

## 2023-12-15 ENCOUNTER — Encounter: Payer: Self-pay | Admitting: Oncology

## 2023-12-15 ENCOUNTER — Ambulatory Visit (INDEPENDENT_AMBULATORY_CARE_PROVIDER_SITE_OTHER): Payer: Commercial Managed Care - PPO | Admitting: Internal Medicine

## 2023-12-15 ENCOUNTER — Encounter: Payer: Self-pay | Admitting: Internal Medicine

## 2023-12-15 VITALS — BP 124/74 | HR 78 | Ht 65.0 in | Wt 305.0 lb

## 2023-12-15 DIAGNOSIS — Z6841 Body Mass Index (BMI) 40.0 and over, adult: Secondary | ICD-10-CM

## 2023-12-15 DIAGNOSIS — N182 Chronic kidney disease, stage 2 (mild): Secondary | ICD-10-CM

## 2023-12-15 DIAGNOSIS — E559 Vitamin D deficiency, unspecified: Secondary | ICD-10-CM | POA: Diagnosis not present

## 2023-12-15 DIAGNOSIS — G600 Hereditary motor and sensory neuropathy: Secondary | ICD-10-CM

## 2023-12-15 DIAGNOSIS — R7989 Other specified abnormal findings of blood chemistry: Secondary | ICD-10-CM | POA: Diagnosis not present

## 2023-12-15 DIAGNOSIS — Z794 Long term (current) use of insulin: Secondary | ICD-10-CM | POA: Diagnosis not present

## 2023-12-15 DIAGNOSIS — E118 Type 2 diabetes mellitus with unspecified complications: Secondary | ICD-10-CM

## 2023-12-15 DIAGNOSIS — F322 Major depressive disorder, single episode, severe without psychotic features: Secondary | ICD-10-CM

## 2023-12-15 DIAGNOSIS — E1169 Type 2 diabetes mellitus with other specified complication: Secondary | ICD-10-CM | POA: Diagnosis not present

## 2023-12-15 DIAGNOSIS — I10 Essential (primary) hypertension: Secondary | ICD-10-CM | POA: Diagnosis not present

## 2023-12-15 DIAGNOSIS — R21 Rash and other nonspecific skin eruption: Secondary | ICD-10-CM

## 2023-12-15 DIAGNOSIS — E785 Hyperlipidemia, unspecified: Secondary | ICD-10-CM | POA: Diagnosis not present

## 2023-12-15 MED ORDER — MUPIROCIN 2 % EX OINT: 1.0000 | TOPICAL_OINTMENT | Freq: Every day | CUTANEOUS | 12 refills | Status: AC

## 2023-12-15 MED ORDER — NYSTATIN 100000 UNIT/GM EX CREA: 1.0000 | TOPICAL_CREAM | Freq: Two times a day (BID) | CUTANEOUS | 12 refills | Status: AC

## 2023-12-15 MED FILL — Continuous Glucose System Sensor: 84 days supply | Qty: 6 | Fill #0 | Status: AC

## 2023-12-15 NOTE — Assessment & Plan Note (Signed)
 Controlled BP with normal exam. Current regimen is amlodipine. Will continue same medications; encourage continued reduced sodium diet.

## 2023-12-15 NOTE — Assessment & Plan Note (Signed)
 On Statin therapy but has not had lipid panel in some time Will obtain today and advise

## 2023-12-15 NOTE — Assessment & Plan Note (Signed)
Doing well on Sertraline 

## 2023-12-15 NOTE — Progress Notes (Signed)
 Date:  12/15/2023   Name:  Maria Singh   DOB:  May 21, 1951   MRN:  [id number]   Chief Complaint: Hyperlipidemia, Hypertension, and Diabetes  Hypertension This is a chronic problem. The problem is controlled. Pertinent negatives include no chest pain, headaches, palpitations or shortness of breath.  Diabetes She presents for her follow-up diabetic visit. She has type 2 diabetes mellitus. Her disease course has been stable. Pertinent negatives for hypoglycemia include no headaches, nervousness/anxiousness or tremors. Associated symptoms include weakness. Pertinent negatives for diabetes include no chest pain, no fatigue, no polydipsia and no polyuria.  Hyperlipidemia This is a chronic problem. The problem is uncontrolled. Recent lipid tests were reviewed and are high. Pertinent negatives include no chest pain or shortness of breath.    Review of Systems  Constitutional:  Negative for appetite change, fatigue, fever and unexpected weight change.  HENT:  Negative for tinnitus and trouble swallowing.   Eyes:  Negative for visual disturbance.  Respiratory:  Negative for cough, chest tightness and shortness of breath.   Cardiovascular:  Negative for chest pain, palpitations and leg swelling.  Gastrointestinal:  Negative for abdominal pain (occasional mild pain), diarrhea and vomiting.  Endocrine: Negative for polydipsia and polyuria.  Genitourinary:  Negative for dysuria and hematuria.  Musculoskeletal:  Positive for gait problem (wheelchair bound). Negative for arthralgias.  Neurological:  Positive for weakness. Negative for tremors, numbness and headaches.  Psychiatric/Behavioral:  Negative for dysphoric mood and sleep disturbance. The patient is not nervous/anxious.      Lab Results  Component Value Date   NA 141 11/17/2023   K 4.4 11/17/2023   CO2 20 11/17/2023   GLUCOSE 119 (H) 01/20/2023   BUN 34 (A) 11/17/2023   CREATININE 1.0 11/17/2023   CALCIUM 9.3 01/20/2023   EGFR  62 11/17/2023   GFRNONAA 52 (L) 01/20/2023   Lab Results  Component Value Date   CHOL 168 02/09/2022   HDL 34 (L) 02/09/2022   LDLCALC UNABLE TO CALCULATE IF TRIGLYCERIDE OVER 400 mg/dL 46/96/2952   LDLDIRECT 90.2 02/09/2022   TRIG 484 (H) 02/09/2022   CHOLHDL 4.9 02/09/2022   Lab Results  Component Value Date   TSH 5.610 (H) 08/11/2023   Lab Results  Component Value Date   HGBA1C 5.6 05/12/2022   Lab Results  Component Value Date   WBC 7.1 11/17/2023   HGB 11.1 (A) 11/17/2023   HCT 33 (A) 11/17/2023   MCV 87.7 08/11/2023   PLT 194 11/17/2023   Lab Results  Component Value Date   ALT 22 01/20/2023   AST 26 01/20/2023   ALKPHOS 26 (L) 01/20/2023   BILITOT 0.3 01/20/2023   Lab Results  Component Value Date   VD25OH 18.8 (L) 10/01/2020     Patient Active Problem List   Diagnosis Date Noted   Major depressive disorder, single episode, severe without psychotic features (HCC) 12/15/2023   Elevated TSH 05/05/2023   B12 deficiency 01/24/2023   CKD (chronic kidney disease) stage 2, GFR 60-89 ml/min 01/20/2023   Toenail avulsion, initial encounter 05/25/2022   Extensor carpi ulnaris tendinitis 05/12/2022   Trigger middle finger of right hand 05/12/2022   Bilateral leg edema 04/22/2022   Renal tubular acidosis, 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   Hyperkalemia 12/06/2019   Morbid obesity with BMI of 50.0-59.9, adult (HCC) 12/06/2019   Ambulatory dysfunction 12/06/2019   Type II diabetes mellitus with complication (HCC) 12/13/2018   Iron deficiency anemia 12/13/2018   OSA on CPAP 11/25/2018  LPRD (laryngopharyngeal reflux disease) 04/13/2018   Hyperlipidemia associated with type 2 diabetes mellitus (HCC) 08/10/2017   Essential hypertension 08/10/2017   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   Left arm   HERNIA REPAIR      Social History   Tobacco Use   Smoking status: Never   Smokeless tobacco: Never  Vaping Use   Vaping status: Never Used  Substance Use Topics   Alcohol use: Not Currently    Alcohol/week: 0.0 standard drinks of alcohol    Comment: very very rare   Drug use: Never     Medication list has been reviewed and updated.  Current Meds  Medication Sig   Accu-Chek FastClix Lancets MISC TEST ONCE DAILY   acetaminophen (TYLENOL) 500 MG tablet Take 500 mg by mouth every 6 (six) hours as needed.   amLODipine (NORVASC) 5 MG tablet Take 1 tablet (5 mg total) by mouth daily.   amoxicillin-clavulanate (AUGMENTIN) 875-125 MG tablet Take 1 tablet by mouth 2 (two) times daily for 10 days.   aspirin 81 MG tablet Take 81 mg by mouth daily.   blood glucose meter kit and supplies KIT Dispense based on patient and insurance preference. Use up to four times daily as directed.   Cholecalciferol (VITAMIN D) 50 MCG (2000 UT) tablet Take 2,000 Units by mouth daily.   clotrimazole-betamethasone (LOTRISONE) cream Apply 1 application topically 2 (two) times daily. (Patient taking differently: Apply 1 application  topically as needed.)   Continuous Glucose Sensor (FREESTYLE LIBRE 3 SENSOR) MISC 1 each by Does not apply route every 14 (fourteen) days. Use to check glucose continuously   cyanocobalamin (VITAMIN B12) 1000 MCG tablet Take 1 tablet (1,000 mcg total) by mouth daily.   diclofenac Sodium (VOLTAREN) 1 % GEL Apply 2 g topically 4 (four) times daily. To affected joint.   docusate sodium (COLACE) 100 MG capsule Take 100 mg by mouth daily.    Famotidine (PEPCID PO) Take by mouth daily. Dr. Conception Oms   ferrous sulfate 325 (65 FE) MG tablet Take 1 tablet (325 mg total) by mouth daily with breakfast.   fluconazole (DIFLUCAN) 100 MG tablet Take 1 tablet (100 mg total) by mouth daily.   Glucagon (GVOKE  HYPOPEN 2-PACK) 1 MG/0.2ML SOAJ Inject 1 each into the skin daily as needed.   glucose blood (FREESTYLE LITE) test strip Use up to four times daily as directed   hydrochlorothiazide (HYDRODIURIL) 25 MG tablet Take 1 tablet (25 mg total) by mouth daily.   ibuprofen (ADVIL) 800 MG tablet Take 1 tablet (800 mg total) by mouth every 8 (eight) hours as needed.   icosapent Ethyl (VASCEPA) 1 g capsule Take 2 capsules (2 g total) by mouth 2 (two) times daily.   insulin glargine-yfgn (SEMGLEE, YFGN,) 100 UNIT/ML Pen Inject 60 Units into the skin in the morning AND 50 Units every evening.   Insulin Pen Needle (COMFORT EZ PEN NEEDLES) 31G X 6 MM MISC Use as directed 3 times a day   loratadine (CLARITIN) 10 MG tablet Take 10 mg by mouth daily.   metFORMIN (GLUCOPHAGE) 1000 MG tablet Take 1 tablet (1,000 mg total) by mouth 2 (two) times daily.  NON FORMULARY Auto CPap nightly   ondansetron (ZOFRAN) 4 MG tablet Take 1-2 tablets (4-8 mg total) by mouth every 8 (eight) hours as needed for nausea or vomiting.   patiromer (VELTASSA) 8.4 g packet Take 1 packet (8.4 g total) by mouth daily.   rosuvastatin (CRESTOR) 5 MG tablet Take 1 tablet (5 mg total) by mouth daily.   Semaglutide, 1 MG/DOSE, 4 MG/3ML SOPN Inject 1 mg into the skin once a week.   sertraline (ZOLOFT) 100 MG tablet Take 1 tablet (100 mg total) by mouth daily.   sodium bicarbonate 650 MG tablet Take 1 tablet (650 mg total) by mouth in the morning and at bedtime.   triamcinolone cream (KENALOG) 0.1 % Apply 1 Application topically daily as needed. Qd up to 5 days a week top of feet until clear, avoid face, groin, axilla   [DISCONTINUED] mupirocin ointment (BACTROBAN) 2 % Apply daily to open sores on legs/feet until resolved   [DISCONTINUED] nystatin (MYCOSTATIN/NYSTOP) powder Apply 1 Application topically 3 (three) times daily.   [DISCONTINUED] nystatin cream (MYCOSTATIN) Apply 1 Application topically 2 (two) times daily.   [DISCONTINUED] SPIKEVAX  syringe        12/15/2023    1:19 PM 01/27/2022    2:27 PM 10/01/2020    3:42 PM 08/06/2020   11:10 AM  GAD 7 : Generalized Anxiety Score  Nervous, Anxious, on Edge 0 0 0 0  Control/stop worrying 0 0 0 0  Worry too much - different things 0 0 0 0  Trouble relaxing 0 0 0 0  Restless 0 0 0 0  Easily annoyed or irritable 0 0 0 0  Afraid - awful might happen 0 0 0 0  Total GAD 7 Score 0 0 0 0  Anxiety Difficulty  Not difficult at all  Not difficult at all       12/15/2023    1:19 PM 03/18/2023   10:07 AM 01/27/2022    2:27 PM  Depression screen PHQ 2/9  Decreased Interest 0 0 0  Down, Depressed, Hopeless 0 0 0  PHQ - 2 Score 0 0 0  Altered sleeping 0 0 0  Tired, decreased energy 0 0 0  Change in appetite 0 0 0  Feeling bad or failure about yourself  0 0 0  Trouble concentrating 0 0 0  Moving slowly or fidgety/restless 0 0 0  Suicidal thoughts 0 0 0  PHQ-9 Score 0 0 0  Difficult doing work/chores Not difficult at all Not difficult at all Not difficult at all    BP Readings from Last 3 Encounters:  12/15/23 124/74  08/18/23 (!) 169/65  08/18/23 (!) 149/60    Physical Exam Vitals and nursing note reviewed.  Constitutional:      General: She is not in acute distress.    Appearance: She is well-developed. She is obese.  HENT:     Head: Normocephalic and atraumatic.  Neck:     Vascular: No carotid bruit.  Cardiovascular:     Rate and Rhythm: Normal rate and regular rhythm.     Heart sounds: No murmur heard. Pulmonary:     Effort: Pulmonary effort is normal. No respiratory distress.     Breath sounds: No wheezing or rhonchi.  Abdominal:     Tenderness: There is no abdominal tenderness.  Musculoskeletal:     Right shoulder: Decreased range of motion.     Left shoulder: Decreased range of motion.     Cervical back: Normal range of  motion.     Right lower leg: No edema.     Left lower leg: No edema.     Comments: Mild contractures of both hands  Lymphadenopathy:      Cervical: No cervical adenopathy.  Skin:    General: Skin is warm and dry.     Findings: Wound present. No rash.     Comments: Puncture wound of right hand - no evidence of cellulitis  Neurological:     Mental Status: She is alert and oriented to person, place, and time. Mental status is at baseline.     Sensory: Sensory deficit present.     Motor: Weakness present.     Comments: Lower extremity flexion contractures and reduced ROM ankles Reflexes not tested; non ambulatory   Psychiatric:        Mood and Affect: Mood normal.        Behavior: Behavior normal.     Wt Readings from Last 3 Encounters:  12/15/23 (!) 305 lb (138.3 kg)  03/18/23 (!) 305 lb (138.3 kg)  05/12/22 (!) 305 lb (138.3 kg)    BP 124/74   Pulse 78   Ht 5\' 5"  (1.651 m)   Wt (!) 305 lb (138.3 kg)   SpO2 94%   BMI 50.75 kg/m   Assessment and Plan:  Problem List Items Addressed This Visit       Unprioritized   Charcot Marie Tooth muscular atrophy (Chronic)   Slowly progressive disability - now wheelchair bound Functioning at home with supportive spouse, adaptive equipment      Hyperlipidemia associated with type 2 diabetes mellitus (HCC) (Chronic)   On Statin therapy but has not had lipid panel in some time Will obtain today and advise      Relevant Orders   Comprehensive metabolic panel   Lipid panel   Essential hypertension - Primary (Chronic)   Controlled BP with normal exam. Current regimen is amlodipine. Will continue same medications; encourage continued reduced sodium diet.       Type II diabetes mellitus with complication (HCC) (Chronic)   Blood sugars stable but with occasional low BS - usually during the night. Currently managed with MTF, Ozempic and Semglee (50 AM and 50 PM). Recommend reducing PM insulin to 45 units. Changes made last visit are none. Lab Results  Component Value Date   HGBA1C 5.6 05/12/2022         Relevant Orders   Hemoglobin A1c   CKD (chronic kidney  disease) stage 2, GFR 60-89 ml/min (Chronic)   Followed closely by Nephrology Recent GFR normal but urine microalb high.      Relevant Orders   Comprehensive metabolic panel   Elevated TSH (Chronic)   Relevant Orders   TSH + free T4   Major depressive disorder, single episode, severe without psychotic features (HCC)   Doing well on Sertraline      Other Visit Diagnoses       Vitamin D deficiency       Relevant Orders   VITAMIN D 25 Hydroxy (Vit-D Deficiency, Fractures)     Rash       Relevant Medications   mupirocin ointment (BACTROBAN) 2 %   nystatin cream (MYCOSTATIN)     BMI 50.0-59.9, adult (HCC)   (Chronic)       Long-term insulin use (HCC)           Return in about 4 months (around 04/13/2024) for DM, HTN.    Reubin Milan, MD [facility]  [facility] and [facility] [place]

## 2023-12-15 NOTE — Assessment & Plan Note (Addendum)
 Blood sugars stable but with occasional low BS - usually during the night. Currently managed with MTF, Ozempic and Semglee (50 AM and 50 PM). Recommend reducing PM insulin to 45 units. Changes made last visit are none. Lab Results  Component Value Date   HGBA1C 5.6 05/12/2022

## 2023-12-15 NOTE — Assessment & Plan Note (Signed)
 Followed closely by Nephrology Recent GFR normal but urine microalb high.

## 2023-12-15 NOTE — Assessment & Plan Note (Signed)
 Slowly progressive disability - now wheelchair bound Functioning at home with supportive spouse, adaptive equipment

## 2023-12-16 LAB — COMPREHENSIVE METABOLIC PANEL
ALT: 21 IU/L (ref 0–32)
AST: 21 IU/L (ref 0–40)
Albumin: 4.2 g/dL (ref 3.8–4.8)
Alkaline Phosphatase: 26 IU/L — ABNORMAL LOW (ref 44–121)
BUN/Creatinine Ratio: 32 — ABNORMAL HIGH (ref 12–28)
BUN: 29 mg/dL — ABNORMAL HIGH (ref 8–27)
Bilirubin Total: <0.2 mg/dL (ref 0.0–1.2)
CO2: 22 mmol/L (ref 20–29)
Calcium: 9.7 mg/dL (ref 8.7–10.3)
Chloride: 104 mmol/L (ref 96–106)
Creatinine, Ser: 0.92 mg/dL (ref 0.57–1.00)
Globulin, Total: 2.4 g/dL (ref 1.5–4.5)
Glucose: 91 mg/dL (ref 70–99)
Potassium: 5.2 mmol/L (ref 3.5–5.2)
Sodium: 141 mmol/L (ref 134–144)
Total Protein: 6.6 g/dL (ref 6.0–8.5)
eGFR: 67 mL/min/{1.73_m2} (ref >59)

## 2023-12-16 LAB — TSH+FREE T4
Free T4: 0.85 ng/dL (ref 0.82–1.77)
TSH: 6.33 u[IU]/mL — ABNORMAL HIGH (ref 0.450–4.500)

## 2023-12-16 LAB — HEMOGLOBIN A1C
Est. average glucose Bld gHb Est-mCnc: 123 mg/dL
Hgb A1c MFr Bld: 5.9 % — ABNORMAL HIGH (ref 4.8–5.6)

## 2023-12-16 LAB — LIPID PANEL
Chol/HDL Ratio: 4.3 ratio (ref 0.0–4.4)
Cholesterol, Total: 134 mg/dL (ref 100–199)
HDL: 31 mg/dL — ABNORMAL LOW (ref >39)
LDL Chol Calc (NIH): 64 mg/dL (ref 0–99)
Triglycerides: 237 mg/dL — ABNORMAL HIGH (ref 0–149)
VLDL Cholesterol Cal: 39 mg/dL (ref 5–40)

## 2023-12-16 LAB — VITAMIN D 25 HYDROXY (VIT D DEFICIENCY, FRACTURES): Vit D, 25-Hydroxy: 38.3 ng/mL (ref 30.0–100.0)

## 2023-12-17 ENCOUNTER — Encounter: Payer: Self-pay | Admitting: Internal Medicine

## 2023-12-18 ENCOUNTER — Other Ambulatory Visit: Payer: Self-pay | Admitting: Internal Medicine

## 2023-12-18 DIAGNOSIS — E118 Type 2 diabetes mellitus with unspecified complications: Secondary | ICD-10-CM

## 2023-12-19 ENCOUNTER — Other Ambulatory Visit: Payer: Self-pay | Admitting: Internal Medicine

## 2023-12-19 ENCOUNTER — Other Ambulatory Visit: Payer: Self-pay

## 2023-12-19 DIAGNOSIS — E118 Type 2 diabetes mellitus with unspecified complications: Secondary | ICD-10-CM

## 2023-12-20 ENCOUNTER — Other Ambulatory Visit: Payer: Self-pay | Admitting: Internal Medicine

## 2023-12-20 ENCOUNTER — Other Ambulatory Visit: Payer: Self-pay

## 2023-12-20 MED FILL — Rosuvastatin Calcium Tab 5 MG: ORAL | 90 days supply | Qty: 90 | Fill #2 | Status: AC

## 2023-12-21 ENCOUNTER — Other Ambulatory Visit: Payer: Self-pay | Admitting: Internal Medicine

## 2023-12-21 ENCOUNTER — Other Ambulatory Visit: Payer: Self-pay

## 2023-12-21 MED FILL — Insulin Glargine-yfgn Soln Pen-Injector 100 Unit/ML: SUBCUTANEOUS | 28 days supply | Qty: 30 | Fill #0 | Status: AC

## 2023-12-21 NOTE — Telephone Encounter (Signed)
 Requested medication (s) are due for refill today: yes  Requested medication (s) are on the active medication list: yes  Last refill:  11/15/23 #30 mL/0  Future visit scheduled: yes  Notes to clinic:  new dose wasn't sent in from LOV notes.  Currently managed with MTF, Ozempic and Semglee (50 AM and 50 PM). Recommend reducing PM insulin to 45 units.   Requested Prescriptions  Pending Prescriptions Disp Refills   SEMGLEE, YFGN, 100 UNIT/ML Pen [Pharmacy Med Name: insulin glargine-yfgn (SEMGLEE, YFGN,) 100 UNIT/ML Pen] 30 mL 0    Sig: Inject 60 Units into the skin in the morning AND 50 Units every evening.     Off-Protocol Failed - 12/21/2023  9:43 AM      Failed - Medication not assigned to a protocol, review manually.      Passed - Valid encounter within last 12 months    Recent Outpatient Visits           1 year ago Extensor carpi ulnaris tendinitis   [place] [facility] [place] [facility], Ocie Bob, MD   1 year ago Toenail avulsion, initial encounter   [facility], Ocie Bob, MD   1 year ago Extensor carpi ulnaris tendinitis   [place] [facility], Ocie Bob, MD   1 year ago Essential hypertension   [place] [facility] at [facility], Nyoka Cowden, MD   3 years ago Type II diabetes mellitus with complication [facility])    Primary Care & Sports Medicine at [facility], Nyoka Cowden, MD       Future Appointments             In 4 months Judithann Graves, Nyoka Cowden, MD [facility] Primary Care & Sports Medicine at [facility], [facility]

## 2023-12-22 ENCOUNTER — Other Ambulatory Visit: Payer: Self-pay

## 2023-12-22 MED FILL — Amlodipine Besylate Tab 5 MG (Base Equivalent): ORAL | 90 days supply | Qty: 90 | Fill #3 | Status: CN

## 2023-12-22 MED FILL — Sertraline HCl Tab 100 MG: ORAL | 90 days supply | Qty: 90 | Fill #3 | Status: CN

## 2023-12-22 MED FILL — "Insulin Pen Needle 31 G X 6 MM (1/4"" or 15/64"")": 90 days supply | Qty: 300 | Fill #0 | Status: AC

## 2023-12-22 MED FILL — Hydrochlorothiazide Tab 25 MG: ORAL | 90 days supply | Qty: 90 | Fill #2 | Status: CN

## 2023-12-22 NOTE — Telephone Encounter (Signed)
 Requested Prescriptions  Pending Prescriptions Disp Refills   Insulin Pen Needle (COMFORT EZ PEN NEEDLES) 31G X 6 MM MISC [Pharmacy Med Name: Insulin Pen Needle (COMFORT EZ PEN NEEDLES) 31G X 6 MM Misc] 300 each 1    Sig: Use as directed 3 times a day     Endocrinology: Diabetes - Testing Supplies Passed - 12/22/2023 11:07 AM      Passed - Valid encounter within last 12 months    Recent Outpatient Visits           1 year ago Extensor carpi ulnaris tendinitis   [place] [facility] at [facility] [place] [facility], Ocie Bob, MD   1 year ago Toenail avulsion, initial encounter   [facility] at [facility], Ocie Bob, MD   1 year ago Extensor carpi ulnaris tendinitis   [place] [facility], Ocie Bob, MD   1 year ago Essential hypertension   [place] Primary Care & Sports Medicine at [facility], Nyoka Cowden, MD   3 years ago Type II diabetes mellitus with complication [facility])   [place] Primary Care & Sports Medicine at [facility], Nyoka Cowden, MD       Future Appointments             In 4 months Judithann Graves, Nyoka Cowden, MD [facility] Primary Care & Sports Medicine at [facility], [facility]

## 2023-12-23 ENCOUNTER — Other Ambulatory Visit: Payer: Self-pay

## 2023-12-31 ENCOUNTER — Other Ambulatory Visit: Payer: Self-pay

## 2024-01-03 ENCOUNTER — Other Ambulatory Visit: Payer: Self-pay

## 2024-01-03 MED FILL — Sertraline HCl Tab 100 MG: ORAL | 90 days supply | Qty: 90 | Fill #3 | Status: AC

## 2024-01-03 MED FILL — Amlodipine Besylate Tab 5 MG (Base Equivalent): ORAL | 90 days supply | Qty: 90 | Fill #3 | Status: AC

## 2024-01-03 MED FILL — Hydrochlorothiazide Tab 25 MG: ORAL | 90 days supply | Qty: 90 | Fill #2 | Status: AC

## 2024-01-14 DIAGNOSIS — H43812 Vitreous degeneration, left eye: Secondary | ICD-10-CM | POA: Diagnosis not present

## 2024-01-14 DIAGNOSIS — H2513 Age-related nuclear cataract, bilateral: Secondary | ICD-10-CM | POA: Diagnosis not present

## 2024-01-14 DIAGNOSIS — E119 Type 2 diabetes mellitus without complications: Secondary | ICD-10-CM | POA: Diagnosis not present

## 2024-01-14 MED FILL — Insulin Glargine-yfgn Soln Pen-Injector 100 Unit/ML: SUBCUTANEOUS | 28 days supply | Qty: 30 | Fill #1 | Status: AC

## 2024-01-16 ENCOUNTER — Other Ambulatory Visit: Payer: Self-pay

## 2024-01-20 MED FILL — Metformin HCl Tab 1000 MG: ORAL | 90 days supply | Qty: 180 | Fill #2 | Status: CN

## 2024-01-31 ENCOUNTER — Other Ambulatory Visit: Payer: Self-pay

## 2024-02-01 ENCOUNTER — Other Ambulatory Visit: Payer: Self-pay

## 2024-02-09 ENCOUNTER — Other Ambulatory Visit

## 2024-02-09 ENCOUNTER — Inpatient Hospital Stay: Attending: Oncology

## 2024-02-09 DIAGNOSIS — D631 Anemia in chronic kidney disease: Secondary | ICD-10-CM | POA: Diagnosis not present

## 2024-02-09 DIAGNOSIS — D509 Iron deficiency anemia, unspecified: Secondary | ICD-10-CM | POA: Insufficient documentation

## 2024-02-09 DIAGNOSIS — N182 Chronic kidney disease, stage 2 (mild): Secondary | ICD-10-CM | POA: Insufficient documentation

## 2024-02-09 DIAGNOSIS — E538 Deficiency of other specified B group vitamins: Secondary | ICD-10-CM | POA: Diagnosis not present

## 2024-02-09 DIAGNOSIS — E1122 Type 2 diabetes mellitus with diabetic chronic kidney disease: Secondary | ICD-10-CM | POA: Insufficient documentation

## 2024-02-09 DIAGNOSIS — D508 Other iron deficiency anemias: Secondary | ICD-10-CM

## 2024-02-09 LAB — CBC WITH DIFFERENTIAL (CANCER CENTER ONLY)
Abs Immature Granulocytes: 0.03 10*3/uL (ref 0.00–0.07)
Basophils Absolute: 0 10*3/uL (ref 0.0–0.1)
Basophils Relative: 1 %
Eosinophils Absolute: 0.3 10*3/uL (ref 0.0–0.5)
Eosinophils Relative: 6 %
HCT: 32.6 % — ABNORMAL LOW (ref 36.0–46.0)
Hemoglobin: 10.7 g/dL — ABNORMAL LOW (ref 12.0–15.0)
Immature Granulocytes: 1 %
Lymphocytes Relative: 18 %
Lymphs Abs: 1.1 10*3/uL (ref 0.7–4.0)
MCH: 29 pg (ref 26.0–34.0)
MCHC: 32.8 g/dL (ref 30.0–36.0)
MCV: 88.3 fL (ref 80.0–100.0)
Monocytes Absolute: 0.5 10*3/uL (ref 0.1–1.0)
Monocytes Relative: 9 %
Neutro Abs: 4.1 10*3/uL (ref 1.7–7.7)
Neutrophils Relative %: 65 %
Platelet Count: 180 10*3/uL (ref 150–400)
RBC: 3.69 MIL/uL — ABNORMAL LOW (ref 3.87–5.11)
RDW: 14.4 % (ref 11.5–15.5)
WBC Count: 6.2 10*3/uL (ref 4.0–10.5)
nRBC: 0 % (ref 0.0–0.2)

## 2024-02-09 LAB — IRON AND TIBC
Iron: 64 ug/dL (ref 28–170)
Saturation Ratios: 19 % (ref 10.4–31.8)
TIBC: 340 ug/dL (ref 250–450)
UIBC: 276 ug/dL

## 2024-02-09 LAB — RETIC PANEL
Immature Retic Fract: 15.7 % (ref 2.3–15.9)
RBC.: 3.74 MIL/uL — ABNORMAL LOW (ref 3.87–5.11)
Retic Count, Absolute: 70.7 10*3/uL (ref 19.0–186.0)
Retic Ct Pct: 1.9 % (ref 0.4–3.1)
Reticulocyte Hemoglobin: 30.8 pg (ref >27.9)

## 2024-02-09 LAB — FERRITIN: Ferritin: 112 ng/mL (ref 11–307)

## 2024-02-09 LAB — VITAMIN B12: Vitamin B-12: 1179 pg/mL — ABNORMAL HIGH (ref 180–914)

## 2024-02-10 MED FILL — Metformin HCl Tab 1000 MG: ORAL | 90 days supply | Qty: 180 | Fill #2 | Status: AC

## 2024-02-16 ENCOUNTER — Encounter: Payer: Self-pay | Admitting: Oncology

## 2024-02-16 ENCOUNTER — Inpatient Hospital Stay

## 2024-02-16 ENCOUNTER — Inpatient Hospital Stay (HOSPITAL_BASED_OUTPATIENT_CLINIC_OR_DEPARTMENT_OTHER): Admitting: Oncology

## 2024-02-16 VITALS — BP 139/48 | HR 79 | Temp 97.8°F | Resp 19

## 2024-02-16 VITALS — BP 129/72 | Temp 97.9°F | Resp 18

## 2024-02-16 DIAGNOSIS — E538 Deficiency of other specified B group vitamins: Secondary | ICD-10-CM

## 2024-02-16 DIAGNOSIS — D508 Other iron deficiency anemias: Secondary | ICD-10-CM | POA: Diagnosis not present

## 2024-02-16 DIAGNOSIS — E1122 Type 2 diabetes mellitus with diabetic chronic kidney disease: Secondary | ICD-10-CM | POA: Diagnosis not present

## 2024-02-16 DIAGNOSIS — N182 Chronic kidney disease, stage 2 (mild): Secondary | ICD-10-CM

## 2024-02-16 DIAGNOSIS — N189 Chronic kidney disease, unspecified: Secondary | ICD-10-CM

## 2024-02-16 DIAGNOSIS — D631 Anemia in chronic kidney disease: Secondary | ICD-10-CM | POA: Diagnosis not present

## 2024-02-16 DIAGNOSIS — D509 Iron deficiency anemia, unspecified: Secondary | ICD-10-CM | POA: Diagnosis not present

## 2024-02-16 MED ORDER — IRON SUCROSE 20 MG/ML IV SOLN
200.0000 mg | Freq: Once | INTRAVENOUS | Status: AC
  Administered 2024-02-16: 200 mg via INTRAVENOUS

## 2024-02-16 MED ORDER — SODIUM CHLORIDE 0.9% FLUSH
10.0000 mL | Freq: Once | INTRAVENOUS | Status: AC | PRN
  Administered 2024-02-16: 10 mL
  Filled 2024-02-16: qty 10

## 2024-02-16 NOTE — Assessment & Plan Note (Addendum)
 Labs reviewed and discussed with patient. Lab Results  Component Value Date   HGB 10.7 (L) 02/09/2024   TIBC 340 02/09/2024   IRONPCTSAT 19 02/09/2024   FERRITIN 112 02/09/2024   Hemoglobin slightly decreased.   recommend additional  Venofer  200 mg weekly x 3.  Goals of ferritin is around 200. No need for erythropoietin therapy given that hemoglobin is above 10.  Patient has difficulty completing colonoscopy prep due to mobility issues. Recommend Cologuard test done annually.

## 2024-02-16 NOTE — Progress Notes (Signed)
 Hematology/Oncology Progress note [phone] [phone]        REFERRING PROVIDER: Sheron Dixons, MD  CHIEF COMPLAINTS/REASON FOR VISIT:  Anemia due to chronic kidney disease  ASSESSMENT & PLAN:   Anemia due to chronic kidney disease Labs reviewed and discussed with patient. Lab Results  Component Value Date   HGB 10.7 (L) 02/09/2024   TIBC 340 02/09/2024   IRONPCTSAT 19 02/09/2024   FERRITIN 112 02/09/2024   Hemoglobin slightly decreased.   recommend additional  Venofer  200 mg weekly x 3.  Goals of ferritin is around 200. No need for erythropoietin therapy given that hemoglobin is above 10.  Patient has difficulty completing colonoscopy prep due to mobility issues. Recommend Cologuard test done annually.   B12 deficiency  continue Vitamin B12 supplementation, recommend to decrease frequency to 3 times per week.  Orders Placed This Encounter  Procedures   CBC with Differential (Cancer Center Only)    Standing Status:   Future    Expected Date:   08/17/2024    Expiration Date:   02/15/2025   Iron  and TIBC    Standing Status:   Future    Expected Date:   08/17/2024    Expiration Date:   02/15/2025   Ferritin    Standing Status:   Future    Expected Date:   08/17/2024    Expiration Date:   02/15/2025   Vitamin B12    Standing Status:   Future    Expected Date:   08/17/2024    Expiration Date:   02/15/2025   Retic Panel    Standing Status:   Future    Expected Date:   08/17/2024    Expiration Date:   02/15/2025   Follow up 6 months.   All questions were answered. The patient knows to call the clinic with any problems, questions or concerns.  Timmy Forbes, MD, PhD [facility] Hematology Oncology 02/16/2024     HISTORY OF PRESENTING ILLNESS:  Maria Singh is a  73 y.o.  female with PMH listed below who was referred to me for anemia Reviewed patient's recent labs that was done.  She was found to have abnormal CBC on 12/30/22 with  Hb 9.8, mcv 80.4, normal wbc and platelet.  Reviewed patient's previous labs ordered by primary care physician's office, anemia is chronic onset , duration is since at least 2016, baseline level was in 11s in 2023.  She takes oral iron  supplementation, recently off.  Mentioned occasional constipation, stating "It's not that bad, but once in a while." She had not noticed any recent bleeding such as epistaxis, hematuria or hematochezia.  Patient has a history of Stage 2 chronic kidney disease with normal creatinine levels, managed by Dr. Rhesa Celeste. Patient reports variable energy levels and is "basically in the wheelchair." Charcot Marie Tooth muscular atrophy  - No chest pain, shortness of breath, or blood in stool reported. Mentioned occasional constipation, stating "It's not that bad, but once in a while.". she is not able to get colonoscopy due to not able to tolerate the prep.  She has taken annual Fecal immunochemical tests    INTERVAL HISTORY Maria Singh is a 73 y.o. female who has above history reviewed by me today presents for follow up visit for anemia. Patient has previously received IV Venofer  treatments.   No other new complaints. Urinary incontinence.  Patient takes vitamin B12 supplementation daily. She was accompanied by her friend  MEDICAL HISTORY:  Past Medical History:  Diagnosis Date   Anemia    Charcot Marie Tooth muscular atrophy    Chronic kidney disease    Depression    Diabetes mellitus without complication (HCC)    GERD (gastroesophageal reflux disease)    Hyperlipidemia    Hypertension    Rhabdomyolysis 12/06/2019   Rotator cuff injury Nov. 2012   left arm   Sepsis (HCC) 07/16/2018   Sepsis (HCC) 07/16/2018   Sleep apnea     SURGICAL HISTORY: Past Surgical History:  Procedure Laterality Date   arm surgery Left    broken arm   FRACTURE SURGERY  1998 ?   Left arm   HERNIA REPAIR      SOCIAL HISTORY: Social History   Socioeconomic History    Marital status: Married    Spouse name: Not on file   Number of children: Not on file   Years of education: Not on file   Highest education level: Not on file  Occupational History   Not on file  Tobacco Use   Smoking status: Never   Smokeless tobacco: Never  Vaping Use   Vaping status: Never Used  Substance and Sexual Activity   Alcohol use: Not Currently    Alcohol/week: 0.0 standard drinks of alcohol    Comment: very very rare   Drug use: Never   Sexual activity: Not Currently    Birth control/protection: None  Other Topics Concern   Not on file  Social History Narrative   Not on file   Social Drivers of Health   Financial Resource Strain: Low Risk  (03/18/2023)   Overall Financial Resource Strain (CARDIA)    Difficulty of Paying Living Expenses: Not hard at all  Food Insecurity: No Food Insecurity (03/18/2023)   Hunger Vital Sign    Worried About Running Out of Food in the Last Year: Never true    Ran Out of Food in the Last Year: Never true  Transportation Needs: No Transportation Needs (03/18/2023)   PRAPARE - Administrator, Civil Service (Medical): No    Lack of Transportation (Non-Medical): No  Physical Activity: Inactive (03/18/2023)   Exercise Vital Sign    Days of Exercise per Week: 0 days    Minutes of Exercise per Session: 0 min  Stress: No Stress Concern Present (03/18/2023)   [employer] of Occupational Health - Occupational Stress Questionnaire    Feeling of Stress : Only a little  Social Connections: Moderately Isolated (03/18/2023)   Social Connection and Isolation Panel [NHANES]    Frequency of Communication with Friends and Family: More than three times a week    Frequency of Social Gatherings with Friends and Family: Three times a week    Attends Religious Services: Never    Active Member of Clubs or Organizations: No    Attends Banker Meetings: Never    Marital Status: Married  Catering manager Violence: Not At Risk  (03/18/2023)   Humiliation, Afraid, Rape, and Kick questionnaire    Fear of Current or Ex-Partner: No    Emotionally Abused: No    Physically Abused: No    Sexually Abused: No    FAMILY HISTORY: Family History  Problem Relation Age of Onset   Cancer Mother    Heart disease Father    Diabetes Maternal Grandmother    Hypertension Maternal Grandmother    Obesity Maternal Grandmother    Heart disease Paternal Grandfather    Breast cancer Maternal Aunt  mat great aunt    ALLERGIES:  is allergic to metronidazole  and related and sulfa antibiotics.  MEDICATIONS:  Current Outpatient Medications  Medication Sig Dispense Refill   Accu-Chek FastClix Lancets MISC TEST ONCE DAILY 102 each 2   acetaminophen  (TYLENOL ) 500 MG tablet Take 500 mg by mouth every 6 (six) hours as needed.     amLODipine  (NORVASC ) 5 MG tablet Take 1 tablet (5 mg total) by mouth daily. 90 tablet 3   aspirin  81 MG tablet Take 81 mg by mouth daily.     blood glucose meter kit and supplies KIT Dispense based on patient and insurance preference. Use up to four times daily as directed. 1 each 0   Cholecalciferol (VITAMIN D ) 50 MCG (2000 UT) tablet Take 2,000 Units by mouth daily.     clotrimazole -betamethasone  (LOTRISONE ) cream Apply 1 application topically 2 (two) times daily. (Patient taking differently: Apply 1 application  topically as needed.) 30 g 1   Continuous Glucose Sensor (FREESTYLE LIBRE 3 SENSOR) MISC 1 each by Does not apply route every 14 (fourteen) days. Use to check glucose continuously 6 each 3   cyanocobalamin  (VITAMIN B12) 1000 MCG tablet Take 1 tablet (1,000 mcg total) by mouth daily. 90 tablet 0   diclofenac  Sodium (VOLTAREN ) 1 % GEL Apply 2 g topically 4 (four) times daily. To affected joint. 100 g 1   docusate sodium  (COLACE) 100 MG capsule Take 100 mg by mouth daily.      Famotidine  (PEPCID  PO) Take by mouth daily. Dr. Frieda Jew     glucose blood (FREESTYLE LITE) test strip Use up to four times  daily as directed 100 each 0   hydrochlorothiazide  (HYDRODIURIL ) 25 MG tablet Take 1 tablet (25 mg total) by mouth daily. 90 tablet 3   icosapent  Ethyl (VASCEPA ) 1 g capsule Take 2 capsules (2 g total) by mouth 2 (two) times daily. 360 capsule 3   insulin  glargine-yfgn (SEMGLEE , YFGN,) 100 UNIT/ML Pen Inject 60 Units into the skin in the morning AND 50 Units every evening. 30 mL 1   loratadine (CLARITIN) 10 MG tablet Take 10 mg by mouth daily.     metFORMIN  (GLUCOPHAGE ) 1000 MG tablet Take 1 tablet (1,000 mg total) by mouth 2 (two) times daily. 180 tablet 3   mupirocin  ointment (BACTROBAN ) 2 % Apply daily to open sores on legs/feet until resolved 30 g 12   NON FORMULARY Auto CPap nightly     nystatin  cream (MYCOSTATIN ) Apply 1 Application topically 2 (two) times daily. 60 g 12   ondansetron  (ZOFRAN ) 4 MG tablet Take 1-2 tablets (4-8 mg total) by mouth every 8 (eight) hours as needed for nausea or vomiting. 40 tablet 1   patiromer  (VELTASSA ) 8.4 g packet Take 1 packet (8.4 g total) by mouth daily. 30 each 11   rosuvastatin  (CRESTOR ) 5 MG tablet Take 1 tablet (5 mg total) by mouth daily. 90 tablet 3   Semaglutide , 1 MG/DOSE, 4 MG/3ML SOPN Inject 1 mg into the skin once a week. 3 mL 5   sodium bicarbonate  650 MG tablet Take 1 tablet (650 mg total) by mouth in the morning and at bedtime. 180 tablet 0   triamcinolone  cream (KENALOG ) 0.1 % Apply 1 Application topically daily as needed. Qd up to 5 days a week top of feet until clear, avoid face, groin, axilla 80 g 0   ferrous sulfate  325 (65 FE) MG tablet Take 1 tablet (325 mg total) by mouth daily with breakfast. (Patient not taking:  Reported on 02/16/2024) 90 tablet 1   fluconazole  (DIFLUCAN ) 100 MG tablet Take 1 tablet (100 mg total) by mouth daily. (Patient not taking: Reported on 02/16/2024) 3 tablet 0   Glucagon  (GVOKE HYPOPEN  2-PACK) 1 MG/0.2ML SOAJ Inject 1 each into the skin daily as needed. (Patient not taking: Reported on 02/16/2024) 0.4 mL 0    Insulin  Pen Needle (COMFORT EZ PEN NEEDLES) 31G X 6 MM MISC Use as directed 3 times a day 300 each 1   sertraline  (ZOLOFT ) 100 MG tablet Take 1 tablet (100 mg total) by mouth daily. (Patient not taking: Reported on 02/16/2024) 90 tablet 3   No current facility-administered medications for this visit.    Review of Systems  Constitutional:  Positive for fatigue. Negative for appetite change, chills, fever and unexpected weight change.  HENT:   Negative for hearing loss and voice change.   Eyes:  Negative for eye problems.  Respiratory:  Negative for chest tightness, cough and shortness of breath.   Cardiovascular:  Negative for chest pain.  Gastrointestinal:  Negative for abdominal distention, abdominal pain, blood in stool and constipation.       Occasional constipation  Genitourinary:  Negative for difficulty urinating.   Musculoskeletal:  Positive for arthralgias.  Skin:  Negative for itching and rash.  Neurological:  Positive for extremity weakness.  Hematological:  Negative for adenopathy.  Psychiatric/Behavioral:  Negative for confusion.     PHYSICAL EXAMINATION: Vitals:   02/16/24 1344  BP: 129/72  Resp: 18  Temp: 97.9 F (36.6 C)  SpO2: 95%   Filed Weights    Physical Exam Constitutional:      General: She is not in acute distress.    Appearance: She is obese.     Comments: Patient sits in the wheelchair  HENT:     Head: Normocephalic and atraumatic.  Eyes:     General: No scleral icterus. Cardiovascular:     Rate and Rhythm: Normal rate and regular rhythm.  Pulmonary:     Effort: Pulmonary effort is normal. No respiratory distress.     Breath sounds: No wheezing.  Abdominal:     General: Bowel sounds are normal. There is no distension.     Palpations: Abdomen is soft.  Musculoskeletal:     Cervical back: Normal range of motion and neck supple.  Lymphadenopathy:     Cervical: No cervical adenopathy.  Skin:    General: Skin is warm.  Neurological:      Mental Status: She is alert and oriented to person, place, and time. Mental status is at baseline.  Psychiatric:        Mood and Affect: Mood normal.      LABORATORY DATA:  I have reviewed the data as listed    Latest Ref Rng & Units 02/09/2024    1:20 PM 11/17/2023   12:00 AM 08/11/2023   11:29 AM  CBC  WBC 4.0 - 10.5 K/uL 6.2  7.1     6.0   Hemoglobin 12.0 - 15.0 g/dL 82.9  56.2     13.0   Hematocrit 36.0 - 46.0 % 32.6  33     34.9   Platelets 150 - 400 K/uL 180  194     170      This result is from an external source.      Latest Ref Rng & Units 12/15/2023    2:02 PM 11/17/2023   12:00 AM 01/20/2023    4:04 PM  CMP  Glucose 70 -  99 mg/dL 91   161   BUN 8 - 27 mg/dL 29  34     39   Creatinine 0.57 - 1.00 mg/dL 0.96  1.0     0.45   Sodium 134 - 144 mmol/L 141  141     139   Potassium 3.5 - 5.2 mmol/L 5.2  4.4     4.9   Chloride 96 - 106 mmol/L 104  108     105   CO2 20 - 29 mmol/L 22  20     23    Calcium  8.7 - 10.3 mg/dL 9.7   9.3   Total Protein 6.0 - 8.5 g/dL 6.6   7.4   Total Bilirubin 0.0 - 1.2 mg/dL <4.0   0.3   Alkaline Phos 44 - 121 IU/L 26   26   AST 0 - 40 IU/L 21   26   ALT 0 - 32 IU/L 21   22      This result is from an external source.       Lab Results  Component Value Date   FERRITIN 112 02/09/2024   IRONPCTSAT 19 02/09/2024   TIBC 340 02/09/2024   IRON  64 02/09/2024      RADIOGRAPHIC STUDIES: I have personally reviewed the radiological images as listed and agreed with the findings in the report. No results found.

## 2024-02-16 NOTE — Assessment & Plan Note (Signed)
 continue Vitamin B12 supplementation, recommend to decrease frequency to 3 times per week.

## 2024-02-16 NOTE — Progress Notes (Signed)
 Pt here for for follow up. No new concerns.

## 2024-02-17 ENCOUNTER — Ambulatory Visit: Payer: Medicare Other

## 2024-02-17 ENCOUNTER — Ambulatory Visit: Payer: Medicare Other | Admitting: Oncology

## 2024-02-23 ENCOUNTER — Inpatient Hospital Stay: Attending: Oncology

## 2024-02-23 VITALS — BP 158/74 | HR 72 | Temp 97.5°F | Resp 16

## 2024-02-23 DIAGNOSIS — E1122 Type 2 diabetes mellitus with diabetic chronic kidney disease: Secondary | ICD-10-CM | POA: Diagnosis not present

## 2024-02-23 DIAGNOSIS — N182 Chronic kidney disease, stage 2 (mild): Secondary | ICD-10-CM | POA: Diagnosis not present

## 2024-02-23 DIAGNOSIS — D509 Iron deficiency anemia, unspecified: Secondary | ICD-10-CM | POA: Diagnosis not present

## 2024-02-23 DIAGNOSIS — D631 Anemia in chronic kidney disease: Secondary | ICD-10-CM

## 2024-02-23 DIAGNOSIS — Z794 Long term (current) use of insulin: Secondary | ICD-10-CM | POA: Insufficient documentation

## 2024-02-23 MED ORDER — IRON SUCROSE 20 MG/ML IV SOLN
200.0000 mg | Freq: Once | INTRAVENOUS | Status: AC
  Administered 2024-02-23: 200 mg via INTRAVENOUS
  Filled 2024-02-23: qty 10

## 2024-02-23 NOTE — Patient Instructions (Signed)

## 2024-02-27 ENCOUNTER — Other Ambulatory Visit: Payer: Self-pay | Admitting: Internal Medicine

## 2024-02-27 DIAGNOSIS — E118 Type 2 diabetes mellitus with unspecified complications: Secondary | ICD-10-CM

## 2024-02-27 MED FILL — Continuous Glucose System Sensor: 84 days supply | Qty: 6 | Fill #1 | Status: AC

## 2024-02-28 ENCOUNTER — Other Ambulatory Visit: Payer: Self-pay | Admitting: Internal Medicine

## 2024-02-28 ENCOUNTER — Other Ambulatory Visit: Payer: Self-pay

## 2024-02-28 DIAGNOSIS — E118 Type 2 diabetes mellitus with unspecified complications: Secondary | ICD-10-CM

## 2024-02-29 ENCOUNTER — Other Ambulatory Visit: Payer: Self-pay

## 2024-03-01 ENCOUNTER — Inpatient Hospital Stay

## 2024-03-01 ENCOUNTER — Other Ambulatory Visit: Payer: Self-pay

## 2024-03-01 ENCOUNTER — Encounter: Payer: Self-pay | Admitting: Oncology

## 2024-03-01 VITALS — BP 119/62 | HR 75 | Temp 97.2°F | Resp 16

## 2024-03-01 DIAGNOSIS — E1122 Type 2 diabetes mellitus with diabetic chronic kidney disease: Secondary | ICD-10-CM | POA: Diagnosis not present

## 2024-03-01 DIAGNOSIS — D631 Anemia in chronic kidney disease: Secondary | ICD-10-CM

## 2024-03-01 DIAGNOSIS — Z794 Long term (current) use of insulin: Secondary | ICD-10-CM | POA: Diagnosis not present

## 2024-03-01 DIAGNOSIS — N182 Chronic kidney disease, stage 2 (mild): Secondary | ICD-10-CM

## 2024-03-01 DIAGNOSIS — D509 Iron deficiency anemia, unspecified: Secondary | ICD-10-CM | POA: Diagnosis not present

## 2024-03-01 MED ORDER — IRON SUCROSE 20 MG/ML IV SOLN
200.0000 mg | Freq: Once | INTRAVENOUS | Status: AC
  Administered 2024-03-01: 200 mg via INTRAVENOUS
  Filled 2024-03-01: qty 10

## 2024-03-01 MED FILL — Sodium Bicarbonate Tab 650 MG: ORAL | 90 days supply | Qty: 180 | Fill #0 | Status: AC

## 2024-03-01 MED FILL — Semaglutide Soln Pen-inj 1 MG/DOSE (4 MG/3ML): SUBCUTANEOUS | 28 days supply | Qty: 3 | Fill #0 | Status: AC

## 2024-03-01 MED FILL — Insulin Glargine-yfgn Soln Pen-Injector 100 Unit/ML: SUBCUTANEOUS | 28 days supply | Qty: 30 | Fill #0 | Status: AC

## 2024-03-01 NOTE — Patient Instructions (Signed)
Iron Sucrose Injection What is this medication? IRON SUCROSE (EYE ern SOO krose) treats low levels of iron (iron deficiency anemia) in people with kidney disease. Iron is a mineral that plays an important role in making red blood cells, which carry oxygen from your lungs to the rest of your body. This medicine may be used for other purposes; ask your health care provider or pharmacist if you have questions. COMMON BRAND NAME(S): Venofer What should I tell my care team before I take this medication? They need to know if you have any of these conditions: Anemia not caused by low iron levels Heart disease High levels of iron in the blood Kidney disease Liver disease An unusual or allergic reaction to iron, other medications, foods, dyes, or preservatives Pregnant or trying to get pregnant Breastfeeding How should I use this medication? This medication is for infusion into a vein. It is given in a hospital or clinic setting. Talk to your care team about the use of this medication in children. While this medication may be prescribed for children as young as 2 years for selected conditions, precautions do apply. Overdosage: If you think you have taken too much of this medicine contact a poison control center or emergency room at once. NOTE: This medicine is only for you. Do not share this medicine with others. What if I miss a dose? Keep appointments for follow-up doses. It is important not to miss your dose. Call your care team if you are unable to keep an appointment. What may interact with this medication? Do not take this medication with any of the following: Deferoxamine Dimercaprol Other iron products This medication may also interact with the following: Chloramphenicol Deferasirox This list may not describe all possible interactions. Give your health care provider a list of all the medicines, herbs, non-prescription drugs, or dietary supplements you use. Also tell them if you smoke,  drink alcohol, or use illegal drugs. Some items may interact with your medicine. What should I watch for while using this medication? Visit your care team regularly. Tell your care team if your symptoms do not start to get better or if they get worse. You may need blood work done while you are taking this medication. You may need to follow a special diet. Talk to your care team. Foods that contain iron include: whole grains/cereals, dried fruits, beans, or peas, leafy green vegetables, and organ meats (liver, kidney). What side effects may I notice from receiving this medication? Side effects that you should report to your care team as soon as possible: Allergic reactions--skin rash, itching, hives, swelling of the face, lips, tongue, or throat Low blood pressure--dizziness, feeling faint or lightheaded, blurry vision Shortness of breath Side effects that usually do not require medical attention (report to your care team if they continue or are bothersome): Flushing Headache Joint pain Muscle pain Nausea Pain, redness, or irritation at injection site This list may not describe all possible side effects. Call your doctor for medical advice about side effects. You may report side effects to FDA at 1-800-FDA-1088. Where should I keep my medication? This medication is given in a hospital or clinic and will not be stored at home. NOTE: This sheet is a summary. It may not cover all possible information. If you have questions about this medicine, talk to your doctor, pharmacist, or health care provider.  2023 Elsevier/Gold Standard (2021-01-16 00:00:00)  

## 2024-03-01 NOTE — Telephone Encounter (Signed)
 Requested Prescriptions  Pending Prescriptions Disp Refills   SEMGLEE , YFGN, 100 UNIT/ML Pen [Pharmacy Med Name: insulin  glargine-yfgn (SEMGLEE , YFGN,) 100 UNIT/ML Pen] 30 mL 1    Sig: Inject 60 Units into the skin in the morning AND 50 Units every evening.     Off-Protocol Failed - 03/01/2024 11:15 AM      Failed - Medication not assigned to a protocol, review manually.      Passed - Valid encounter within last 12 months    Recent Outpatient Visits           2 months ago Essential hypertension   [place] [facility] at [facility], Chales [place], MD       Future Appointments             In 2 months Gala Jubilee [place], MD [facility] at [facility], [facility]             Semaglutide , 1 MG/DOSE, (OZEMPIC , 1 MG/DOSE,) 4 MG/3ML SOPN [Pharmacy Med Name: Semaglutide , 1 MG/DOSE, 4 MG/3ML Solution Pen-injector] 3 mL 1    Sig: Inject 1 mg into the skin once a week.     Endocrinology:  Diabetes - GLP-1 Receptor Agonists - semaglutide  Failed - 03/01/2024 11:15 AM      Failed - HBA1C in normal range and within 180 days    Hemoglobin A1C  Date Value Ref Range Status  01/15/2022 6.1  Final   Hgb A1c MFr Bld  Date Value Ref Range Status  12/15/2023 5.9 (H) 4.8 - 5.6 % Final    Comment:             Prediabetes: 5.7 - 6.4          Diabetes: >6.4          Glycemic control for adults with diabetes: <7.0          Passed - Cr in normal range and within 360 days    Creatinine  Date Value Ref Range Status  08/11/2013 1.08 0.60 - 1.30 mg/dL Final   Creatinine, Ser  Date Value Ref Range Status  12/15/2023 0.92 0.57 - 1.00 mg/dL Final   Creatinine, Urine  Date Value Ref Range Status  11/17/2023 61  Final         Passed - Valid encounter within last 6 months    Recent Outpatient Visits           2 months ago Essential hypertension   [place] [facility] at [facility], Chales [place], MD        Future Appointments             In 2 months Sheron Dixons, MD [facility] at [facility], [facility]             sodium bicarbonate  650 MG tablet 180 tablet 0    Sig: Take 1 tablet (650 mg total) by mouth in the morning and at bedtime.     Endocrinology:  Minerals 2 Failed - 03/01/2024 11:15 AM      Failed - Mg Level in normal range and within 180 days    Magnesium  Date Value Ref Range Status  12/08/2019 1.8 1.7 - 2.4 mg/dL Final    Comment:    Performed at [facility], [address] [place], [place] [address]         Passed - Valid encounter  within last 12 months    Recent Outpatient Visits           2 months ago Essential hypertension   [place] [facility] at [facility], Chales [place], MD       Future Appointments             In 2 months Gala Jubilee [place], MD [facility] at [facility]            Passed - BMP within normal limits in the last 6 months    Glucose  Date Value Ref Range Status  12/15/2023 91 70 - 99 mg/dL Final  66/03/3015 010  Final  08/11/2013 152 (H) 65 - 99 mg/dL Final   Glucose, Bld  Date Value Ref Range Status  01/20/2023 119 (H) 70 - 99 mg/dL Final    Comment:    Glucose reference range applies only to samples taken after fasting for at least 8 hours.   Glucose-Capillary  Date Value Ref Range Status  12/08/2019 130 (H) 70 - 99 mg/dL Final   Calcium   Date Value Ref Range Status  12/15/2023 9.7 8.7 - 10.3 mg/dL Final   Calcium , Total  Date Value Ref Range Status  08/11/2013 8.4 (L) 8.5 - 10.1 mg/dL Final   Sodium  Date Value Ref Range Status  12/15/2023 141 134 - 144 mmol/L Final  08/11/2013 144 136 - 145 mmol/L Final   Potassium  Date Value Ref Range Status  12/15/2023 5.2 3.5 - 5.2 mmol/L Final  08/11/2013 5.7 (H) 3.5 - 5.1 mmol/L Final   Chloride  Date Value Ref Range Status  12/15/2023 104  96 - 106 mmol/L Final  08/11/2013 116 (H) 98 - 107 mmol/L Final   BUN  Date Value Ref Range Status  12/15/2023 29 (H) 8 - 27 mg/dL Final  93/23/5573 12 7 - 18 mg/dL Final   Creatinine  Date Value Ref Range Status  08/11/2013 1.08 0.60 - 1.30 mg/dL Final   Creatinine, Ser  Date Value Ref Range Status  12/15/2023 0.92 0.57 - 1.00 mg/dL Final   Creatinine, Urine  Date Value Ref Range Status  11/17/2023 61  Final   CO2  Date Value Ref Range Status  12/15/2023 22 20 - 29 mmol/L Final   Co2  Date Value Ref Range Status  08/11/2013 22 21 - 32 mmol/L Final   EGFR (African American)  Date Value Ref Range Status  08/11/2013 >60  Final   GFR calc Af Amer  Date Value Ref Range Status  10/01/2020 75 >59 mL/min/1.73 Final    Comment:    **In accordance with recommendations from the NKF-ASN Task force,**   [facility] is in the process of updating its eGFR calculation to the   2021 CKD-EPI creatinine equation that estimates kidney function   without a race variable.    eGFR  Date Value Ref Range Status  12/15/2023 67 >59 mL/min/1.73 Final   EGFR (Non-African Amer.)  Date Value Ref Range Status  08/11/2013 55 (L)  Final    Comment:    eGFR values <20mL/min/1.73 m2 may be an indication of chronic kidney disease (CKD). Calculated eGFR is useful in patients with stable renal function. The eGFR calculation will not be reliable in acutely ill patients when serum creatinine is changing rapidly. It is not useful in  patients on dialysis. The eGFR calculation may not be applicable to patients at the low and high extremes  of body sizes, pregnant women, and vegetarians.    GFR, Estimated  Date Value Ref Range Status  01/20/2023 52 (L) >60 mL/min Final    Comment:    (NOTE) Calculated using the CKD-EPI Creatinine Equation (2021)

## 2024-03-01 NOTE — Telephone Encounter (Signed)
 Please review

## 2024-03-01 NOTE — Telephone Encounter (Signed)
 Requested medication (s) are due for refill today -yes  Requested medication (s) are on the active medication list -yes  Future visit scheduled -yes  Last refill: 12/21/23 30ml 1RF  Notes to clinic: off protocol- provider review   Requested Prescriptions  Pending Prescriptions Disp Refills   SEMGLEE , YFGN, 100 UNIT/ML Pen [Pharmacy Med Name: insulin  glargine-yfgn (SEMGLEE , YFGN,) 100 UNIT/ML Pen] 30 mL 1    Sig: Inject 60 Units into the skin in the morning AND 50 Units every evening.     Off-Protocol Failed - 03/01/2024 11:16 AM      Failed - Medication not assigned to a protocol, review manually.      Passed - Valid encounter within last 12 months    Recent Outpatient Visits           2 months ago Essential hypertension   [place] [facility] at [facility], [place], MD       Future Appointments             In 2 months Sheron Dixons, MD [facility] Medicine at [facility], [facility]            Signed Prescriptions Disp Refills   Semaglutide , 1 MG/DOSE, (OZEMPIC , 1 MG/DOSE,) 4 MG/3ML SOPN 3 mL 1    Sig: Inject 1 mg into the skin once a week.     Endocrinology:  Diabetes - GLP-1 Receptor Agonists - semaglutide  Failed - 03/01/2024 11:16 AM      Failed - HBA1C in normal range and within 180 days    Hemoglobin A1C  Date Value Ref Range Status  01/15/2022 6.1  Final   Hgb A1c MFr Bld  Date Value Ref Range Status  12/15/2023 5.9 (H) 4.8 - 5.6 % Final    Comment:             Prediabetes: 5.7 - 6.4          Diabetes: >6.4          Glycemic control for adults with diabetes: <7.0          Passed - Cr in normal range and within 360 days    Creatinine  Date Value Ref Range Status  08/11/2013 1.08 0.60 - 1.30 mg/dL Final   Creatinine, Ser  Date Value Ref Range Status  12/15/2023 0.92 0.57 - 1.00 mg/dL Final   Creatinine, Urine  Date Value Ref Range Status  11/17/2023 61  Final         Passed - Valid  encounter within last 6 months    Recent Outpatient Visits           2 months ago Essential hypertension   [place] [facility] at [facility], Chales [place], MD       Future Appointments             In 2 months Sheron Dixons, MD [facility] Medicine at [facility], [facility]             sodium bicarbonate  650 MG tablet 180 tablet 0    Sig: Take 1 tablet (650 mg total) by mouth in the morning and at bedtime.     Endocrinology:  Minerals 2 Failed - 03/01/2024 11:16 AM      Failed - Mg Level in normal range and within 180 days    Magnesium  Date Value Ref Range Status  12/08/2019 1.8  1.7 - 2.4 mg/dL Final    Comment:    Performed at [facility], [address] [place], [place] [address]         Passed - Valid encounter within last 12 months    Recent Outpatient Visits           2 months ago Essential hypertension   [place] [facility] at [facility], Chales [place], MD       Future Appointments             In 2 months Gala Jubilee [place], MD [facility] Primary Care & Sports Medicine at [facility], [facility]            Passed - BMP within normal limits in the last 6 months    Glucose  Date Value Ref Range Status  12/15/2023 91 70 - 99 mg/dL Final  60/45/4098 119  Final  08/11/2013 152 (H) 65 - 99 mg/dL Final   Glucose, Bld  Date Value Ref Range Status  01/20/2023 119 (H) 70 - 99 mg/dL Final    Comment:    Glucose reference range applies only to samples taken after fasting for at least 8 hours.   Glucose-Capillary  Date Value Ref Range Status  12/08/2019 130 (H) 70 - 99 mg/dL Final   Calcium   Date Value Ref Range Status  12/15/2023 9.7 8.7 - 10.3 mg/dL Final   Calcium , Total  Date Value Ref Range Status  08/11/2013 8.4 (L) 8.5 - 10.1 mg/dL Final   Sodium  Date Value Ref Range Status  12/15/2023 141 134 - 144 mmol/L Final  08/11/2013 144 136 - 145  mmol/L Final   Potassium  Date Value Ref Range Status  12/15/2023 5.2 3.5 - 5.2 mmol/L Final  08/11/2013 5.7 (H) 3.5 - 5.1 mmol/L Final   Chloride  Date Value Ref Range Status  12/15/2023 104 96 - 106 mmol/L Final  08/11/2013 116 (H) 98 - 107 mmol/L Final   BUN  Date Value Ref Range Status  12/15/2023 29 (H) 8 - 27 mg/dL Final  14/78/2956 12 7 - 18 mg/dL Final   Creatinine  Date Value Ref Range Status  08/11/2013 1.08 0.60 - 1.30 mg/dL Final   Creatinine, Ser  Date Value Ref Range Status  12/15/2023 0.92 0.57 - 1.00 mg/dL Final   Creatinine, Urine  Date Value Ref Range Status  11/17/2023 61  Final   CO2  Date Value Ref Range Status  12/15/2023 22 20 - 29 mmol/L Final   Co2  Date Value Ref Range Status  08/11/2013 22 21 - 32 mmol/L Final   EGFR (African American)  Date Value Ref Range Status  08/11/2013 >60  Final   GFR calc Af Amer  Date Value Ref Range Status  10/01/2020 75 >59 mL/min/1.73 Final    Comment:    **In accordance with recommendations from the NKF-ASN Task force,**   [facility] is in the process of updating its eGFR calculation to the   2021 CKD-EPI creatinine equation that estimates kidney function   without a race variable.    eGFR  Date Value Ref Range Status  12/15/2023 67 >59 mL/min/1.73 Final   EGFR (Non-African Amer.)  Date Value Ref Range Status  08/11/2013 55 (L)  Final    Comment:    eGFR values <42mL/min/1.73 m2 may be an indication of chronic kidney disease (CKD). Calculated eGFR is useful in patients with stable renal function. The eGFR calculation will not  be reliable in acutely ill patients when serum creatinine is changing rapidly. It is not useful in  patients on dialysis. The eGFR calculation may not be applicable to patients at the low and high extremes of body sizes, pregnant women, and vegetarians.    GFR, Estimated  Date Value Ref Range Status  01/20/2023 52 (L) >60 mL/min Final    Comment:    (NOTE) Calculated  using the CKD-EPI Creatinine Equation (2021)             Requested Prescriptions  Pending Prescriptions Disp Refills   SEMGLEE , YFGN, 100 UNIT/ML Pen [Pharmacy Med Name: insulin  glargine-yfgn (SEMGLEE , YFGN,) 100 UNIT/ML Pen] 30 mL 1    Sig: Inject 60 Units into the skin in the morning AND 50 Units every evening.     Off-Protocol Failed - 03/01/2024 11:16 AM      Failed - Medication not assigned to a protocol, review manually.      Passed - Valid encounter within last 12 months    Recent Outpatient Visits           2 months ago Essential hypertension   [place] [facility] at [facility], [place], MD       Future Appointments             In 2 months Sheron Dixons, MD [facility] Care & [facility] Medicine at [facility], [facility]            Signed Prescriptions Disp Refills   Semaglutide , 1 MG/DOSE, (OZEMPIC , 1 MG/DOSE,) 4 MG/3ML SOPN 3 mL 1    Sig: Inject 1 mg into the skin once a week.     Endocrinology:  Diabetes - GLP-1 Receptor Agonists - semaglutide  Failed - 03/01/2024 11:16 AM      Failed - HBA1C in normal range and within 180 days    Hemoglobin A1C  Date Value Ref Range Status  01/15/2022 6.1  Final   Hgb A1c MFr Bld  Date Value Ref Range Status  12/15/2023 5.9 (H) 4.8 - 5.6 % Final    Comment:             Prediabetes: 5.7 - 6.4          Diabetes: >6.4          Glycemic control for adults with diabetes: <7.0          Passed - Cr in normal range and within 360 days    Creatinine  Date Value Ref Range Status  08/11/2013 1.08 0.60 - 1.30 mg/dL Final   Creatinine, Ser  Date Value Ref Range Status  12/15/2023 0.92 0.57 - 1.00 mg/dL Final   Creatinine, Urine  Date Value Ref Range Status  11/17/2023 61  Final         Passed - Valid encounter within last 6 months    Recent Outpatient Visits           2 months ago Essential hypertension   [place] [facility] at [facility], Chales [place], MD       Future Appointments             In 2 months Sheron Dixons, MD [facility] Care & Sports Medicine at [facility], [facility]             sodium bicarbonate  650 MG tablet 180 tablet 0    Sig: Take 1 tablet (650 mg total) by mouth  in the morning and at bedtime.     Endocrinology:  Minerals 2 Failed - 03/01/2024 11:16 AM      Failed - Mg Level in normal range and within 180 days    Magnesium  Date Value Ref Range Status  12/08/2019 1.8 1.7 - 2.4 mg/dL Final    Comment:    Performed at [facility], [address], [place], [place] [address]         Passed - Valid encounter within last 12 months    Recent Outpatient Visits           2 months ago Essential hypertension   [place] [facility] at [facility], [place], MD       Future Appointments             In 2 months Sheron Dixons, MD [facility] Primary Care & Sports Medicine at [facility], [facility]            Passed - BMP within normal limits in the last 6 months    Glucose  Date Value Ref Range Status  12/15/2023 91 70 - 99 mg/dL Final  19/14/7829 562  Final  08/11/2013 152 (H) 65 - 99 mg/dL Final   Glucose, Bld  Date Value Ref Range Status  01/20/2023 119 (H) 70 - 99 mg/dL Final    Comment:    Glucose reference range applies only to samples taken after fasting for at least 8 hours.   Glucose-Capillary  Date Value Ref Range Status  12/08/2019 130 (H) 70 - 99 mg/dL Final   Calcium   Date Value Ref Range Status  12/15/2023 9.7 8.7 - 10.3 mg/dL Final   Calcium , Total  Date Value Ref Range Status  08/11/2013 8.4 (L) 8.5 - 10.1 mg/dL Final   Sodium  Date Value Ref Range Status  12/15/2023 141 134 - 144 mmol/L Final  08/11/2013 144 136 - 145 mmol/L Final   Potassium  Date Value Ref Range Status  12/15/2023 5.2 3.5 - 5.2 mmol/L Final  08/11/2013 5.7 (H) 3.5 - 5.1 mmol/L Final   Chloride  Date Value Ref Range  Status  12/15/2023 104 96 - 106 mmol/L Final  08/11/2013 116 (H) 98 - 107 mmol/L Final   BUN  Date Value Ref Range Status  12/15/2023 29 (H) 8 - 27 mg/dL Final  13/05/6577 12 7 - 18 mg/dL Final   Creatinine  Date Value Ref Range Status  08/11/2013 1.08 0.60 - 1.30 mg/dL Final   Creatinine, Ser  Date Value Ref Range Status  12/15/2023 0.92 0.57 - 1.00 mg/dL Final   Creatinine, Urine  Date Value Ref Range Status  11/17/2023 61  Final   CO2  Date Value Ref Range Status  12/15/2023 22 20 - 29 mmol/L Final   Co2  Date Value Ref Range Status  08/11/2013 22 21 - 32 mmol/L Final   EGFR (African American)  Date Value Ref Range Status  08/11/2013 >60  Final   GFR calc Af Amer  Date Value Ref Range Status  10/01/2020 75 >59 mL/min/1.73 Final    Comment:    **In accordance with recommendations from the NKF-ASN Task force,**   [facility] is in the process of updating its eGFR calculation to the   2021 CKD-EPI creatinine equation that estimates kidney function   without a race variable.    eGFR  Date Value Ref Range Status  12/15/2023 67 >59 mL/min/1.73 Final   EGFR (Non-African  Amer.)  Date Value Ref Range Status  08/11/2013 55 (L)  Final    Comment:    eGFR values <70mL/min/1.73 m2 may be an indication of chronic kidney disease (CKD). Calculated eGFR is useful in patients with stable renal function. The eGFR calculation will not be reliable in acutely ill patients when serum creatinine is changing rapidly. It is not useful in  patients on dialysis. The eGFR calculation may not be applicable to patients at the low and high extremes of body sizes, pregnant women, and vegetarians.    GFR, Estimated  Date Value Ref Range Status  01/20/2023 52 (L) >60 mL/min Final    Comment:    (NOTE) Calculated using the CKD-EPI Creatinine Equation (2021)

## 2024-03-03 ENCOUNTER — Other Ambulatory Visit: Payer: Self-pay

## 2024-03-06 ENCOUNTER — Encounter: Payer: Self-pay | Admitting: Internal Medicine

## 2024-03-06 ENCOUNTER — Other Ambulatory Visit: Payer: Self-pay | Admitting: Internal Medicine

## 2024-03-06 DIAGNOSIS — L03313 Cellulitis of chest wall: Secondary | ICD-10-CM

## 2024-03-06 MED ORDER — CEPHALEXIN 500 MG PO CAPS: 500.0000 mg | ORAL_CAPSULE | Freq: Four times a day (QID) | ORAL | 0 refills | Status: AC

## 2024-03-08 ENCOUNTER — Ambulatory Visit (INDEPENDENT_AMBULATORY_CARE_PROVIDER_SITE_OTHER): Admitting: Internal Medicine

## 2024-03-08 ENCOUNTER — Encounter: Payer: Self-pay | Admitting: Internal Medicine

## 2024-03-08 VITALS — BP 128/66 | HR 75 | Ht 65.0 in

## 2024-03-08 DIAGNOSIS — E785 Hyperlipidemia, unspecified: Secondary | ICD-10-CM

## 2024-03-08 DIAGNOSIS — Z23 Encounter for immunization: Secondary | ICD-10-CM | POA: Diagnosis not present

## 2024-03-08 DIAGNOSIS — I10 Essential (primary) hypertension: Secondary | ICD-10-CM | POA: Diagnosis not present

## 2024-03-08 DIAGNOSIS — B372 Candidiasis of skin and nail: Secondary | ICD-10-CM

## 2024-03-08 DIAGNOSIS — N182 Chronic kidney disease, stage 2 (mild): Secondary | ICD-10-CM

## 2024-03-08 DIAGNOSIS — E1169 Type 2 diabetes mellitus with other specified complication: Secondary | ICD-10-CM

## 2024-03-08 DIAGNOSIS — Z7984 Long term (current) use of oral hypoglycemic drugs: Secondary | ICD-10-CM | POA: Diagnosis not present

## 2024-03-08 DIAGNOSIS — E118 Type 2 diabetes mellitus with unspecified complications: Secondary | ICD-10-CM | POA: Diagnosis not present

## 2024-03-08 LAB — POCT GLYCOSYLATED HEMOGLOBIN (HGB A1C): Hemoglobin A1C: 5.5 % (ref 4.0–5.6)

## 2024-03-08 NOTE — Assessment & Plan Note (Signed)
 LDL is  Lab Results  Component Value Date   LDLCALC 64 12/15/2023   Current regimen is Crestor .  No medication side effects noted. Goal LDL is <70.

## 2024-03-08 NOTE — Assessment & Plan Note (Addendum)
 Followed by Nephrology. Will need alternative to [place]

## 2024-03-08 NOTE — Assessment & Plan Note (Signed)
 Blood pressure is well controlled.  Current medications amlodipine and hydrochlorothiazide. Will continue same regimen along with efforts to limit dietary sodium.

## 2024-03-08 NOTE — Progress Notes (Signed)
 Date:  03/08/2024   Name:  Maria Singh   DOB:  1951/09/17   MRN:  [id number]   Chief Complaint: Hypertension and Diabetes  Hypertension This is a chronic problem. The problem is controlled. Pertinent negatives include no chest pain, headaches, palpitations or shortness of breath. Past treatments include calcium  channel blockers and diuretics. The current treatment provides significant improvement. There is no history of kidney disease, CAD/MI or CVA.  Diabetes She presents for her follow-up diabetic visit. She has type 2 diabetes mellitus. Her disease course has been stable. Pertinent negatives for hypoglycemia include no dizziness, headaches or nervousness/anxiousness. Pertinent negatives for diabetes include no chest pain and no fatigue. Pertinent negatives for diabetic complications include no CVA. Current diabetic treatments: MTF and basal insulin , ozempic . She is compliant with treatment all of the time.  Hyperlipidemia This is a chronic problem. The problem is controlled. Associated symptoms include myalgias. Pertinent negatives include no chest pain or shortness of breath. Current antihyperlipidemic treatment includes statins. The current treatment provides significant improvement of lipids.    Review of Systems  Constitutional:  Negative for chills, diaphoresis, fatigue, fever and unexpected weight change.  HENT:  Negative for trouble swallowing.   Respiratory:  Negative for chest tightness and shortness of breath.   Cardiovascular:  Positive for leg swelling. Negative for chest pain and palpitations.  Gastrointestinal:  Positive for diarrhea and nausea. Negative for abdominal pain.  Genitourinary:  Negative for dysuria.  Musculoskeletal:  Positive for gait problem and myalgias.  Neurological:  Negative for dizziness and headaches.  Psychiatric/Behavioral:  Negative for dysphoric mood and sleep disturbance. The patient is not nervous/anxious.      Lab Results  Component  Value Date   NA 141 12/15/2023   K 5.2 12/15/2023   CO2 22 12/15/2023   GLUCOSE 91 12/15/2023   BUN 29 (H) 12/15/2023   CREATININE 0.92 12/15/2023   CALCIUM  9.7 12/15/2023   EGFR 67 12/15/2023   GFRNONAA 52 (L) 01/20/2023   Lab Results  Component Value Date   CHOL 134 12/15/2023   HDL 31 (L) 12/15/2023   LDLCALC 64 12/15/2023   LDLDIRECT 90.2 02/09/2022   TRIG 237 (H) 12/15/2023   CHOLHDL 4.3 12/15/2023   Lab Results  Component Value Date   TSH 6.330 (H) 12/15/2023   Lab Results  Component Value Date   HGBA1C 5.5 03/08/2024   Lab Results  Component Value Date   WBC 6.2 02/09/2024   HGB 10.7 (L) 02/09/2024   HCT 32.6 (L) 02/09/2024   MCV 88.3 02/09/2024   PLT 180 02/09/2024   Lab Results  Component Value Date   ALT 21 12/15/2023   AST 21 12/15/2023   ALKPHOS 26 (L) 12/15/2023   BILITOT <0.2 12/15/2023   Lab Results  Component Value Date   VD25OH 38.3 12/15/2023     Patient Active Problem List   Diagnosis Date Noted   Major depressive disorder, single episode, severe without psychotic features (HCC) 12/15/2023   Elevated TSH 05/05/2023   B12 deficiency 01/24/2023   CKD (chronic kidney disease) stage 2, GFR 60-89 ml/min 01/20/2023   Toenail avulsion, initial encounter 05/25/2022   Extensor carpi ulnaris tendinitis 05/12/2022   Trigger middle finger of right hand 05/12/2022   Bilateral leg edema 04/22/2022   Renal tubular acidosis, 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   Hyperkalemia 12/06/2019   Morbid obesity with BMI of 50.0-59.9, adult (HCC) 12/06/2019   Ambulatory dysfunction 12/06/2019   Type II diabetes mellitus with complication (HCC)  12/13/2018   Anemia due to chronic kidney disease 12/13/2018   OSA on CPAP 11/25/2018   LPRD (laryngopharyngeal reflux disease) 04/13/2018   Hyperlipidemia associated with type 2 diabetes mellitus (HCC) 08/10/2017   Essential hypertension 08/10/2017   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   Left arm   HERNIA REPAIR      Social History   Tobacco Use   Smoking status: Never   Smokeless tobacco: Never  Vaping Use   Vaping status: Never Used  Substance Use Topics   Alcohol use: Not Currently    Alcohol/week: 0.0 standard drinks of alcohol    Comment: very very rare   Drug use: Never     Medication list has been reviewed and updated.  Current Meds  Medication Sig   Accu-Chek FastClix Lancets MISC TEST ONCE DAILY   acetaminophen  (TYLENOL ) 500 MG tablet Take 500 mg by mouth every 6 (six) hours as needed.   amLODipine  (NORVASC ) 5 MG tablet Take 1 tablet (5 mg total) by mouth daily.   aspirin  81 MG tablet Take 81 mg by mouth daily.   blood glucose meter kit and supplies KIT Dispense based on patient and insurance preference. Use up to four times daily as directed.   cephALEXin  (KEFLEX ) 500 MG capsule Take 1 capsule (500 mg total) by mouth 4 (four) times daily for 10 days.   Cholecalciferol (VITAMIN D ) 50 MCG (2000 UT) tablet Take 2,000 Units by mouth daily.   clotrimazole -betamethasone  (LOTRISONE ) cream Apply 1 application topically 2 (two) times daily. (Patient taking differently: Apply 1 application  topically as needed.)   Continuous Glucose Sensor (FREESTYLE LIBRE 3 SENSOR) MISC 1 each by Does not apply route every 14 (fourteen) days. Use to check glucose continuously   cyanocobalamin  (VITAMIN B12) 1000 MCG tablet Take 1 tablet (1,000 mcg total) by mouth daily.   diclofenac  Sodium (VOLTAREN ) 1 % GEL Apply 2 g topically 4 (four) times daily. To affected joint. (Patient taking differently: Apply 2 g topically as needed. To affected joint.)   docusate sodium  (COLACE) 100 MG capsule Take 100 mg by mouth daily.    Famotidine  (PEPCID   PO) Take by mouth daily. Dr. Frieda Jew   glucose blood (FREESTYLE LITE) test strip Use up to four times daily as directed   hydrochlorothiazide  (HYDRODIURIL ) 25 MG tablet Take 1 tablet (25 mg total) by mouth daily.   icosapent  Ethyl (VASCEPA ) 1 g capsule Take 2 capsules (2 g total) by mouth 2 (two) times daily.   insulin  glargine-yfgn (SEMGLEE , YFGN,) 100 UNIT/ML Pen Inject 60 Units into the skin in the morning AND 50 Units every evening. (Patient taking differently: Inject 50 Units into the skin in the morning AND 40 Units every evening.)   Insulin  Pen Needle (COMFORT EZ PEN NEEDLES) 31G X 6 MM MISC Use as directed 3 times a day   loratadine (CLARITIN) 10 MG tablet Take 10 mg by mouth daily.   metFORMIN  (GLUCOPHAGE ) 1000 MG tablet Take 1 tablet (1,000 mg total) by mouth 2 (two) times daily.   mupirocin  ointment (BACTROBAN ) 2 % Apply daily to open sores on legs/feet until resolved   NON FORMULARY Auto CPap nightly  nystatin  cream (MYCOSTATIN ) Apply 1 Application topically 2 (two) times daily.   ondansetron  (ZOFRAN ) 4 MG tablet Take 1-2 tablets (4-8 mg total) by mouth every 8 (eight) hours as needed for nausea or vomiting.   patiromer  (VELTASSA ) 8.4 g packet Take 1 packet (8.4 g total) by mouth daily.   rosuvastatin  (CRESTOR ) 5 MG tablet Take 1 tablet (5 mg total) by mouth daily.   Semaglutide , 1 MG/DOSE, (OZEMPIC , 1 MG/DOSE,) 4 MG/3ML SOPN Inject 1 mg into the skin once a week.   sertraline  (ZOLOFT ) 100 MG tablet Take 1 tablet (100 mg total) by mouth daily.   sodium bicarbonate  650 MG tablet Take 1 tablet (650 mg total) by mouth in the morning and at bedtime.   triamcinolone  cream (KENALOG ) 0.1 % Apply 1 Application topically daily as needed. Qd up to 5 days a week top of feet until clear, avoid face, groin, axilla       03/08/2024    2:46 PM 12/15/2023    1:19 PM 01/27/2022    2:27 PM 10/01/2020    3:42 PM  GAD 7 : Generalized Anxiety Score  Nervous, Anxious, on Edge 0 0 0 0  Control/stop  worrying 0 0 0 0  Worry too much - different things 0 0 0 0  Trouble relaxing 0 0 0 0  Restless 0 0 0 0  Easily annoyed or irritable 0 0 0 0  Afraid - awful might happen 0 0 0 0  Total GAD 7 Score 0 0 0 0  Anxiety Difficulty Not difficult at all  Not difficult at all        03/08/2024    2:46 PM 12/15/2023    1:19 PM 03/18/2023   10:07 AM  Depression screen PHQ 2/9  Decreased Interest 0 0 0  Down, Depressed, Hopeless 0 0 0  PHQ - 2 Score 0 0 0  Altered sleeping 0 0 0  Tired, decreased energy 0 0 0  Change in appetite 0 0 0  Feeling bad or failure about yourself  0 0 0  Trouble concentrating 0 0 0  Moving slowly or fidgety/restless 0 0 0  Suicidal thoughts 0 0 0  PHQ-9 Score 0 0 0  Difficult doing work/chores Not difficult at all Not difficult at all Not difficult at all    BP Readings from Last 3 Encounters:  03/08/24 128/66  03/01/24 119/62  02/23/24 (!) 158/74    Physical Exam Neck:     Vascular: No carotid bruit.  Cardiovascular:     Rate and Rhythm: Normal rate and regular rhythm.     Heart sounds: No murmur heard. Pulmonary:     Effort: Pulmonary effort is normal.     Breath sounds: No wheezing or rhonchi.  Musculoskeletal:     Cervical back: Normal range of motion.     Right lower leg: Edema present.     Left lower leg: Edema present.  Lymphadenopathy:     Cervical: No cervical adenopathy.  Skin:    General: Skin is warm and dry.     Findings: Erythema (under breasts and in groin) and rash present.  Neurological:     Mental Status: She is alert and oriented to person, place, and time.     Sensory: Sensory deficit present.     Motor: Weakness present.     Gait: Gait abnormal.  Psychiatric:        Mood and Affect: Mood normal.        Behavior: Behavior  normal.    Diabetic Foot Exam - Simple   Simple Foot Form Diabetic Foot exam was performed with the following findings: Yes 03/08/2024  3:25 PM  Visual Inspection See comments: Yes Sensation  Testing See comments: Yes Pulse Check See comments: Yes Comments Edema of both feet; no open lesions or ulcerations Pulses not palpable      Wt Readings from Last 3 Encounters:  12/15/23 (!) 305 lb (138.3 kg)  03/18/23 (!) 305 lb (138.3 kg)  05/12/22 (!) 305 lb (138.3 kg)    BP 128/66   Pulse 75   Ht 5\' 5"  (1.651 m)   SpO2 95%   BMI 50.75 kg/m   Assessment and Plan:  Problem List Items Addressed This Visit       Unprioritized   Hyperlipidemia associated with type 2 diabetes mellitus (HCC) (Chronic)   LDL is  Lab Results  Component Value Date   LDLCALC 64 12/15/2023   Current regimen is Crestor .  No medication side effects noted. Goal LDL is <70.       Essential hypertension - Primary (Chronic)   Blood pressure is well controlled.  Current medications amlodipine  and hydrochlorothiazide . Will continue same regimen along with efforts to limit dietary sodium.       Type II diabetes mellitus with complication (HCC) (Chronic)   Blood sugars have been stable.  No recent hypoglycemic events requiring assistance. Currently medications are Semglee , MTF and Ozempic .  Insurance is changing 6/1 and Semglee  is not covered. Lab Results  Component Value Date   HGBA1C 5.5 03/08/2024   Last visit no changes were made. She will call after June 1 to get a covered insulin .       Relevant Orders   POCT glycosylated hemoglobin (Hb A1C) (Completed)   CKD (chronic kidney disease) stage 2, GFR 60-89 ml/min (Chronic)   Followed by Nephrology. Will need alternative to [facility]       Other Visit Diagnoses       Long term current use of oral hypoglycemic drug         Immunization due       Relevant Orders   Tdap vaccine greater than or equal to 7yo IM (Completed)     Candidal intertrigo       skin rash improving rapidly with Nystatin  topically and keflex  for bacterial superinfection       No follow-ups on file.    Sheron Dixons, MD [facility] Primary Care and  Sports Medicine [place]

## 2024-03-08 NOTE — Assessment & Plan Note (Signed)
 Blood sugars have been stable.  No recent hypoglycemic events requiring assistance. Currently medications are Semglee , MTF and Ozempic .  Insurance is changing 6/1 and Semglee  is not covered. Lab Results  Component Value Date   HGBA1C 5.5 03/08/2024   Last visit no changes were made. She will call after June 1 to get a covered insulin .

## 2024-03-14 ENCOUNTER — Other Ambulatory Visit: Payer: Self-pay | Admitting: Internal Medicine

## 2024-03-14 ENCOUNTER — Other Ambulatory Visit: Payer: Self-pay

## 2024-03-14 DIAGNOSIS — E118 Type 2 diabetes mellitus with unspecified complications: Secondary | ICD-10-CM

## 2024-03-14 DIAGNOSIS — I10 Essential (primary) hypertension: Secondary | ICD-10-CM

## 2024-03-14 DIAGNOSIS — F329 Major depressive disorder, single episode, unspecified: Secondary | ICD-10-CM

## 2024-03-14 MED FILL — Rosuvastatin Calcium Tab 5 MG: ORAL | 90 days supply | Qty: 90 | Fill #3 | Status: AC

## 2024-03-14 MED FILL — "Insulin Pen Needle 31 G X 6 MM (1/4"" or 15/64"")": 90 days supply | Qty: 300 | Fill #1 | Status: AC

## 2024-03-14 MED FILL — Hydrochlorothiazide Tab 25 MG: ORAL | 90 days supply | Qty: 90 | Fill #3 | Status: CN

## 2024-03-15 ENCOUNTER — Other Ambulatory Visit: Payer: Self-pay

## 2024-03-16 ENCOUNTER — Other Ambulatory Visit: Payer: Self-pay

## 2024-03-17 ENCOUNTER — Other Ambulatory Visit: Payer: Self-pay

## 2024-03-17 MED ORDER — SERTRALINE HCL 100 MG PO TABS
100.0000 mg | ORAL_TABLET | Freq: Every day | ORAL | 0 refills | Status: AC
  Filled 2024-03-17: qty 90, 90d supply, fill #0

## 2024-03-17 MED ORDER — GVOKE HYPOPEN 2-PACK 1 MG/0.2ML ~~LOC~~ SOAJ
1.0000 | Freq: Every day | SUBCUTANEOUS | 0 refills | Status: AC | PRN
  Filled 2024-03-17: qty 0.4, 30d supply, fill #0

## 2024-03-17 MED ORDER — FREESTYLE LITE TEST VI STRP
1.0000 | ORAL_STRIP | Freq: Four times a day (QID) | 0 refills | Status: AC
  Filled 2024-03-17: qty 100, 25d supply, fill #0

## 2024-03-17 MED ORDER — AMLODIPINE BESYLATE 5 MG PO TABS
5.0000 mg | ORAL_TABLET | Freq: Every day | ORAL | 0 refills | Status: AC
  Filled 2024-03-17: qty 90, 90d supply, fill #0

## 2024-03-17 NOTE — Telephone Encounter (Signed)
 Requested Prescriptions  Pending Prescriptions Disp Refills   glucose blood (FREESTYLE LITE) test strip 100 each 0    Sig: Use up to four times daily as directed     Endocrinology: Diabetes - Testing Supplies Passed - 03/17/2024  8:21 AM      Passed - Valid encounter within last 12 months    Recent Outpatient Visits           1 week ago Essential hypertension   [place] [facility] at [facility], Chales [place], MD   3 months ago Essential hypertension   [place] [facility] at [facility], Chales [place], MD       Future Appointments             In 1 month Sheron Dixons, MD [facility] at [facility]             amLODipine  (NORVASC ) 5 MG tablet 90 tablet 0    Sig: Take 1 tablet (5 mg total) by mouth daily.     Cardiovascular: Calcium  Channel Blockers 2 Passed - 03/17/2024  8:21 AM      Passed - Last BP in normal range    BP Readings from Last 1 Encounters:  03/08/24 128/66         Passed - Last Heart Rate in normal range    Pulse Readings from Last 1 Encounters:  03/08/24 75         Passed - Valid encounter within last 6 months    Recent Outpatient Visits           1 week ago Essential hypertension   [place] [facility] at [facility], Chales [place], MD   3 months ago Essential hypertension   [place] [facility] at [facility], Chales [place], MD       Future Appointments             In 1 month Sheron Dixons, MD [facility] & Sports Medicine at [facility], [facility]             sertraline  (ZOLOFT ) 100 MG tablet 90 tablet 0    Sig: Take 1 tablet (100 mg total) by mouth daily.     Psychiatry:  Antidepressants - SSRI - sertraline  Passed - 03/17/2024  8:21 AM      Passed - AST in normal range and within 360 days    AST  Date Value Ref Range Status  12/15/2023 21 0 - 40  IU/L Final   SGOT(AST)  Date Value Ref Range Status  08/08/2013 49 (H) 15 - 37 Unit/L Final         Passed - ALT in normal range and within 360 days    ALT  Date Value Ref Range Status  12/15/2023 21 0 - 32 IU/L Final   SGPT (ALT)  Date Value Ref Range Status  08/08/2013 32 12 - 78 U/L Final         Passed - Completed PHQ-2 or PHQ-9 in the last 360 days      Passed - Valid encounter within last 6 months    Recent Outpatient Visits           1 week ago Essential hypertension   [place] [facility] at [facility], Chales [place], MD   3 months ago  Essential hypertension   [place] [facility] at [facility], Chales [place], MD       Future Appointments             In 1 month Gala Jubilee Chales [place], MD [facility] Primary [facility] & Sports Medicine at [facility], [facility]             Glucagon  (GVOKE HYPOPEN  2-PACK) 1 MG/0.2ML SOAJ 0.4 mL 0    Sig: Inject 1 each into the skin daily as needed.     Off-Protocol Failed - 03/17/2024  8:21 AM      Failed - Medication not assigned to a protocol, review manually.      Passed - Valid encounter within last 12 months    Recent Outpatient Visits           1 week ago Essential hypertension   [place] [facility] at [facility], Chales [place], MD   3 months ago Essential hypertension   [facility] at [facility], Chales [place], MD       Future Appointments             In 1 month Gala Jubilee, [place]ales [place], MD [facility] Medicine at [facility], [facility]

## 2024-03-17 NOTE — Telephone Encounter (Signed)
 Requested medication (s) are due for refill today: yes  Requested medication (s) are on the active medication list: yes  Last refill:  09/15/23  Future visit scheduled: yes  Notes to clinic:   Medication not assigned to a protocol, review manually.      Requested Prescriptions  Pending Prescriptions Disp Refills   Glucagon  (GVOKE HYPOPEN  2-PACK) 1 MG/0.2ML SOAJ 0.4 mL 0    Sig: Inject 1 each into the skin daily as needed.     Off-Protocol Failed - 03/17/2024  8:35 AM      Failed - Medication not assigned to a protocol, review manually.      Passed - Valid encounter within last 12 months    Recent Outpatient Visits           1 week ago Essential hypertension   [place] [facility] at [facility], Chales [place], MD   3 months ago Essential hypertension   [place] [facility] at [facility], Chales [place], MD       Future Appointments             In 1 month Gala Jubilee [place], MD [facility] Primary Care & Sports Medicine at [facility], [facility]            Signed Prescriptions Disp Refills   glucose blood (FREESTYLE LITE) test strip 100 each 0    Sig: Use 4 (four) times daily.     Endocrinology: Diabetes - Testing Supplies Passed - 03/17/2024  8:35 AM      Passed - Valid encounter within last 12 months    Recent Outpatient Visits           1 week ago Essential hypertension   [place] [facility] at [facility], Chales [place], MD   3 months ago Essential hypertension   [place] [facility] at [facility], Chales [place], MD       Future Appointments             In 1 month Sheron Dixons, MD [facility] Primary Care & Sports Medicine at [facility], [facility]             amLODipine  (NORVASC ) 5 MG tablet 90 tablet 0    Sig: Take 1 tablet (5 mg total) by mouth daily.     Cardiovascular: Calcium  Channel Blockers 2 Passed - 03/17/2024   8:35 AM      Passed - Last BP in normal range    BP Readings from Last 1 Encounters:  03/08/24 128/66         Passed - Last Heart Rate in normal range    Pulse Readings from Last 1 Encounters:  03/08/24 75         Passed - Valid encounter within last 6 months    Recent Outpatient Visits           1 week ago Essential hypertension   [place] [facility] at [facility], Chales [place], MD   3 months ago Essential hypertension   [facility] at [facility], Chales [place], MD       Future Appointments             In 1 month Gala Jubilee, [place], MD [facility] Primary Care & Sports Medicine at [facility], [facility]  sertraline  (ZOLOFT ) 100 MG tablet 90 tablet 0    Sig: Take 1 tablet (100 mg total) by mouth daily.     Psychiatry:  Antidepressants - SSRI - sertraline  Passed - 03/17/2024  8:35 AM      Passed - AST in normal range and within 360 days    AST  Date Value Ref Range Status  12/15/2023 21 0 - 40 IU/L Final   SGOT(AST)  Date Value Ref Range Status  08/08/2013 49 (H) 15 - 37 Unit/L Final         Passed - ALT in normal range and within 360 days    ALT  Date Value Ref Range Status  12/15/2023 21 0 - 32 IU/L Final   SGPT (ALT)  Date Value Ref Range Status  08/08/2013 32 12 - 78 U/L Final         Passed - Completed PHQ-2 or PHQ-9 in the last 360 days      Passed - Valid encounter within last 6 months    Recent Outpatient Visits           1 week ago Essential hypertension   [place] [facility] at [facility], Chales [place], MD   3 months ago Essential hypertension   [facility] Primary Care & Sports Medicine at [facility], Chales [place], MD       Future Appointments             In 1 month Gala Jubilee, [place], MD [facility] Primary Care & Sports Medicine at [facility], [facility]

## 2024-03-20 ENCOUNTER — Other Ambulatory Visit: Payer: Self-pay

## 2024-03-23 ENCOUNTER — Ambulatory Visit: Payer: Medicare Other

## 2024-03-23 VITALS — Ht 65.0 in | Wt 305.0 lb

## 2024-03-23 DIAGNOSIS — Z0000 Encounter for general adult medical examination without abnormal findings: Secondary | ICD-10-CM | POA: Diagnosis not present

## 2024-03-23 NOTE — Progress Notes (Signed)
 Subjective:   Maria Singh is a 73 y.o. who presents for a Medicare Wellness preventive visit.  As a reminder, Annual Wellness Visits don't include a physical exam, and some assessments may be limited, especially if this visit is performed virtually. We may recommend an in-person follow-up visit with your provider if needed.  Visit Complete: Virtual I connected with  Maria Singh on 03/23/24 by a audio enabled telemedicine application and verified that I am speaking with the correct person using two identifiers.  Patient Location: Home  Provider Location: Home Office  I discussed the limitations of evaluation and management by telemedicine. The patient expressed understanding and agreed to proceed.  Vital Signs: Because this visit was a virtual/telehealth visit, some criteria may be missing or patient reported. Any vitals not documented were not able to be obtained and vitals that have been documented are patient reported.    Persons Participating in Visit: Patient.  AWV Questionnaire: No: Patient Medicare AWV questionnaire was not completed prior to this visit.  Cardiac Risk Factors include: advanced age (>31men, >45 women);diabetes mellitus;hypertension     Objective:     Today's Vitals   03/23/24 0936  Weight: (!) 305 lb (138.3 kg)  Height: 5\' 5"  (1.651 m)   Body mass index is 50.75 kg/m.     03/23/2024    9:53 AM 03/01/2024    1:56 PM 02/16/2024    1:37 PM 08/18/2023    1:28 PM 05/05/2023    1:35 PM 03/18/2023   10:08 AM 02/26/2023    9:41 AM  Advanced Directives  Does Patient Have a Medical Advance Directive? Yes Yes Yes Yes Yes No No  Type of Estate agent of [place];Living will Living will;Healthcare Power of [employer] Power of [employer] Power of [employer] Power of [place];Living will    Does patient want to make changes to medical advance directive?  No - Patient declined       Copy of Healthcare  Power of Attorney in Chart? No - copy requested No - copy requested No - copy requested  No - copy requested    Would patient like information on creating a medical advance directive?  No - Patient declined No - Patient declined  No - Patient declined No - Patient declined     Current Medications (verified) Outpatient Encounter Medications as of 03/23/2024  Medication Sig   Accu-Chek FastClix Lancets MISC TEST ONCE DAILY   acetaminophen  (TYLENOL ) 500 MG tablet Take 500 mg by mouth every 6 (six) hours as needed.   amLODipine  (NORVASC ) 5 MG tablet Take 1 tablet (5 mg total) by mouth daily.   aspirin  81 MG tablet Take 81 mg by mouth daily.   blood glucose meter kit and supplies KIT Dispense based on patient and insurance preference. Use up to four times daily as directed.   Cholecalciferol (VITAMIN D ) 50 MCG (2000 UT) tablet Take 2,000 Units by mouth daily.   clotrimazole -betamethasone  (LOTRISONE ) cream Apply 1 application topically 2 (two) times daily. (Patient taking differently: Apply 1 application  topically as needed.)   Continuous Glucose Sensor (FREESTYLE LIBRE 3 SENSOR) MISC 1 each by Does not apply route every 14 (fourteen) days. Use to check glucose continuously   cyanocobalamin  (VITAMIN B12) 1000 MCG tablet Take 1 tablet (1,000 mcg total) by mouth daily.   diclofenac  Sodium (VOLTAREN ) 1 % GEL Apply 2 g topically 4 (four) times daily. To affected joint. (Patient taking differently: Apply 2 g topically as needed.  To affected joint.)   docusate sodium  (COLACE) 100 MG capsule Take 100 mg by mouth daily.    Famotidine  (PEPCID  PO) Take by mouth daily. Dr. Frieda Jew   Glucagon  (GVOKE HYPOPEN  2-PACK) 1 MG/0.2ML SOAJ Inject 1 each into the skin daily as needed.   glucose blood (FREESTYLE LITE) test strip Use up to 4 (four) times daily as directed   hydrochlorothiazide  (HYDRODIURIL ) 25 MG tablet Take 1 tablet (25 mg total) by mouth daily.   icosapent  Ethyl (VASCEPA ) 1 g capsule Take 2 capsules (2 g  total) by mouth 2 (two) times daily.   insulin  glargine-yfgn (SEMGLEE , YFGN,) 100 UNIT/ML Pen Inject 60 Units into the skin in the morning AND 50 Units every evening. (Patient taking differently: Inject 50 Units into the skin in the morning AND 40 Units every evening.)   Insulin  Pen Needle (COMFORT EZ PEN NEEDLES) 31G X 6 MM MISC Use as directed 3 times a day   loratadine (CLARITIN) 10 MG tablet Take 10 mg by mouth daily.   metFORMIN  (GLUCOPHAGE ) 1000 MG tablet Take 1 tablet (1,000 mg total) by mouth 2 (two) times daily.   mupirocin  ointment (BACTROBAN ) 2 % Apply daily to open sores on legs/feet until resolved   NON FORMULARY Auto CPap nightly   nystatin  cream (MYCOSTATIN ) Apply 1 Application topically 2 (two) times daily.   ondansetron  (ZOFRAN ) 4 MG tablet Take 1-2 tablets (4-8 mg total) by mouth every 8 (eight) hours as needed for nausea or vomiting.   patiromer  (VELTASSA ) 8.4 g packet Take 1 packet (8.4 g total) by mouth daily.   rosuvastatin  (CRESTOR ) 5 MG tablet Take 1 tablet (5 mg total) by mouth daily.   Semaglutide , 1 MG/DOSE, (OZEMPIC , 1 MG/DOSE,) 4 MG/3ML SOPN Inject 1 mg into the skin once a week.   sertraline  (ZOLOFT ) 100 MG tablet Take 1 tablet (100 mg total) by mouth daily.   sodium bicarbonate  650 MG tablet Take 1 tablet (650 mg total) by mouth in the morning and at bedtime.   triamcinolone  cream (KENALOG ) 0.1 % Apply 1 Application topically daily as needed. Qd up to 5 days a week top of feet until clear, avoid face, groin, axilla   No facility-administered encounter medications on file as of 03/23/2024.    Allergies (verified) Metronidazole  and other nitroimidazoles and Sulfa antibiotics   History: Past Medical History:  Diagnosis Date   Anemia    Charcot Marie Tooth muscular atrophy    Chronic kidney disease    Depression    Diabetes mellitus without complication (HCC)    GERD (gastroesophageal reflux disease)    Hyperlipidemia    Hypertension    Rhabdomyolysis  12/06/2019   Rotator cuff injury Nov. 2012   left arm   Sepsis (HCC) 07/16/2018   Sepsis (HCC) 07/16/2018   Sleep apnea    Past Surgical History:  Procedure Laterality Date   arm surgery Left    broken arm   FRACTURE SURGERY  1998 ?   Left arm   HERNIA REPAIR     Family History  Problem Relation Age of Onset   Cancer Mother    Heart disease Father    Diabetes Maternal Grandmother    Hypertension Maternal Grandmother    Obesity Maternal Grandmother    Heart disease Paternal Grandfather    Breast cancer Maternal Aunt        mat great aunt   Social History   Socioeconomic History   Marital status: Married    Spouse name: Not on  file   Number of children: Not on file   Years of education: Not on file   Highest education level: Not on file  Occupational History   Not on file  Tobacco Use   Smoking status: Never   Smokeless tobacco: Never  Vaping Use   Vaping status: Never Used  Substance and Sexual Activity   Alcohol use: Not Currently    Alcohol/week: 0.0 standard drinks of alcohol    Comment: very very rare   Drug use: Never   Sexual activity: Not Currently    Birth control/protection: None  Other Topics Concern   Not on file  Social History Narrative   Not on file   Social Drivers of Health   Financial Resource Strain: Low Risk  (03/23/2024)   Overall Financial Resource Strain (CARDIA)    Difficulty of Paying Living Expenses: Not hard at all  Food Insecurity: No Food Insecurity (03/23/2024)   Hunger Vital Sign    Worried About Running Out of Food in the Last Year: Never true    Ran Out of Food in the Last Year: Never true  Transportation Needs: No Transportation Needs (03/23/2024)   PRAPARE - Administrator, Civil Service (Medical): No    Lack of Transportation (Non-Medical): No  Physical Activity: Inactive (03/23/2024)   Exercise Vital Sign    Days of Exercise per Week: 0 days    Minutes of Exercise per Session: 0 min  Stress: No Stress Concern  Present (03/23/2024)   [employer] of Occupational Health - Occupational Stress Questionnaire    Feeling of Stress : Not at all  Social Connections: Socially Integrated (03/23/2024)   Social Connection and Isolation Panel [NHANES]    Frequency of Communication with Friends and Family: More than three times a week    Frequency of Social Gatherings with Friends and Family: More than three times a week    Attends Religious Services: More than 4 times per year    Active Member of [employer] or Organizations: Yes    Attends Engineer, structural: More than 4 times per year    Marital Status: Married    Tobacco Counseling Counseling given: Not Answered    Clinical Intake:  Pre-visit preparation completed: Yes  Pain : No/denies pain     BMI - recorded: 50.75 Nutritional Status: BMI > 30  Obese Nutritional Risks: None Diabetes: Yes CBG done?: Yes (CBG 95 Per patient) CBG resulted in Enter/ Edit results?: Yes Did pt. bring in CBG monitor from home?: No  Lab Results  Component Value Date   HGBA1C 5.5 03/08/2024   HGBA1C 5.9 (H) 12/15/2023   HGBA1C 5.6 05/12/2022     How often do you need to have someone help you when you read instructions, pamphlets, or other written materials from your doctor or pharmacy?: 1 - Never  Interpreter Needed?: No  Information entered by :: Farris Hong LPN   Activities of Daily Living     03/23/2024    9:49 AM  In your present state of health, do you have any difficulty performing the following activities:  Hearing? 0  Vision? 0  Difficulty concentrating or making decisions? 0  Walking or climbing stairs? 1  Comment Uses a Wheelchair  Dressing or bathing? 1  Comment Husband assist  Doing errands, shopping? 1  Comment Husband assist  Preparing Food and eating ? N  Using the Toilet? N  In the past six months, have you accidently leaked urine? Y  Comment  Wheelchair Pads. Followed by medical attention  Do you have problems with  loss of bowel control? N  Managing your Medications? N  Managing your Finances? N  Housekeeping or managing your Housekeeping? Y  Comment Husband assist    Patient Care Team: Sheron Dixons, MD as PCP - General (Internal Medicine) Lateef, Munsoor, MD (Nephrology) Timmy Forbes, MD as Consulting Physician (Oncology) Rosa College, MD as Consulting Physician (Ophthalmology)  I have updated your Care Teams any recent Medical Services you may have received from other providers in the past year.     Assessment:    This is a routine wellness examination for Maria Singh.  Hearing/Vision screen Hearing Screening - Comments:: Denies hearing difficulties   Vision Screening - Comments:: Wears rx glasses - up to date with routine eye exams with  Dr Alene Husk   Goals Addressed               This Visit's Progress     Lose weight (pt-stated)         Depression Screen     03/23/2024    9:48 AM 03/08/2024    2:46 PM 12/15/2023    1:19 PM 03/18/2023   10:07 AM 01/27/2022    2:27 PM 01/07/2022    1:34 PM 01/06/2021    1:30 PM  PHQ 2/9 Scores  PHQ - 2 Score 0 0 0 0 0 0 0  PHQ- 9 Score 0 0 0 0 0      Fall Risk     03/23/2024    9:52 AM 12/15/2023    1:19 PM 03/18/2023   10:09 AM 03/16/2023    7:30 PM 01/27/2022    2:28 PM  Fall Risk   Falls in the past year? 0 0 0 0 0  Number falls in past yr: 0 0 0 0 0  Injury with Fall? 0 0 0 0 0  Risk for fall due to : No Fall Risks No Fall Risks No Fall Risks  Impaired balance/gait  Follow up Falls evaluation completed Falls evaluation completed Falls prevention discussed;Falls evaluation completed  Falls evaluation completed    MEDICARE RISK AT HOME:  Medicare Risk at Home Any stairs in or around the home?: Yes If so, are there any without handrails?: No Home free of loose throw rugs in walkways, pet beds, electrical cords, etc?: Yes Adequate lighting in your home to reduce risk of falls?: Yes Life alert?: No Use of a cane, walker or w/c?:  Yes Grab bars in the bathroom?: Yes Shower chair or bench in shower?: Yes Elevated toilet seat or a handicapped toilet?: Yes  TIMED UP AND GO:  Was the test performed?  No  Cognitive Function: 6CIT completed        03/23/2024    9:53 AM 03/18/2023   10:16 AM 12/27/2019   11:42 AM  6CIT Screen  What Year? 0 points 0 points 0 points  What month? 0 points 0 points 0 points  What time? 0 points 0 points 0 points  Count back from 20 0 points 0 points 0 points  Months in reverse 0 points 0 points 0 points  Repeat phrase 0 points 0 points 0 points  Total Score 0 points 0 points 0 points    Immunizations Immunization History  Administered Date(s) Administered   Influenza-Unspecified 07/19/2020, 08/08/2021, 08/09/2022   Moderna Covid-19 Vaccine Bivalent Booster 71yrs & up 08/22/2021, 10/29/2022   Moderna Sars-Covid-2 Vaccination 11/23/2019, 12/21/2019, 08/16/2020   Pneumococcal Conjugate-13 04/13/2018  Pneumococcal Polysaccharide-23 10/25/2014, 08/06/2020   Td 10/19/2005   Tdap 03/08/2024   Zoster Recombinant(Shingrix) 01/27/2022   Zoster, Live 10/20/2011    Screening Tests Health Maintenance  Topic Date Due   Colonoscopy  Never done   Zoster Vaccines- Shingrix (2 of 2) 03/24/2022   COVID-19 Vaccine (6 - 2024-25 season) 06/20/2023   INFLUENZA VACCINE  05/19/2024   MAMMOGRAM  06/01/2024   HEMOGLOBIN A1C  09/08/2024   OPHTHALMOLOGY EXAM  11/05/2024   Diabetic kidney evaluation - Urine ACR  11/16/2024   Diabetic kidney evaluation - eGFR measurement  12/14/2024   FOOT EXAM  03/08/2025   Medicare Annual Wellness (AWV)  03/23/2025   DTaP/Tdap/Td (3 - Td or Tdap) 03/08/2034   Pneumonia Vaccine 33+ Years old  Completed   DEXA SCAN  Completed   Hepatitis C Screening  Completed   HPV VACCINES  Aged Out   Meningococcal B Vaccine  Aged Out    Health Maintenance  Health Maintenance Due  Topic Date Due   Colonoscopy  Never done   Zoster Vaccines- Shingrix (2 of 2) 03/24/2022    COVID-19 Vaccine (6 - 2024-25 season) 06/20/2023   Health Maintenance Items Addressed: Patient declined Colonoscopy/Cologuard  Additional Screening:  Vision Screening: Recommended annual ophthalmology exams for early detection of glaucoma and other disorders of the eye. Would you like a referral to an eye doctor? No    Dental Screening: Recommended annual dental exams for proper oral hygiene  Community Resource Referral / Chronic Care Management: CRR required this visit?  No   CCM required this visit?  No   Plan:    I have personally reviewed and noted the following in the patient's chart:   Medical and social history Use of alcohol, tobacco or illicit drugs  Current medications and supplements including opioid prescriptions. Patient is not currently taking opioid prescriptions. Functional ability and status Nutritional status Physical activity Advanced directives List of other physicians Hospitalizations, surgeries, and ER visits in previous 12 months Vitals Screenings to include cognitive, depression, and falls Referrals and appointments  In addition, I have reviewed and discussed with patient certain preventive protocols, quality metrics, and best practice recommendations. A written personalized care plan for preventive services as well as general preventive health recommendations were provided to patient.   Dewayne Ford, LPN   10/24/1094   After Visit Summary: (MyChart) Due to this being a telephonic visit, the after visit summary with patients personalized plan was offered to patient via MyChart   Notes: Nothing significant to report at this time.

## 2024-03-23 NOTE — Patient Instructions (Addendum)
 Maria Singh , Thank you for taking time out of your busy schedule to complete your Annual Wellness Visit with me. I enjoyed our conversation and look forward to speaking with you again next year. I, as well as your care team,  appreciate your ongoing commitment to your health goals. Please review the following plan we discussed and let me know if I can assist you in the future. Your Game plan/ To Do List    Referrals: If you haven't heard from the office you've been referred to, please reach out to them at the phone provided.   Follow up Visits: Next Medicare AWV with our clinical staff: 03/29/25 @ 10:40a   Have you seen your provider in the last 6 months (3 months if uncontrolled diabetes)?  Next Office Visit with your provider: 05/10/24 @ 1;20p  Clinician Recommendations:  Aim for 30 minutes of exercise or brisk walking, 6-8 glasses of water, and 5 servings of fruits and vegetables each day.       This is a list of the screening recommended for you and due dates:  Health Maintenance  Topic Date Due   Colon Cancer Screening  Never done   Zoster (Shingles) Vaccine (2 of 2) 03/24/2022   COVID-19 Vaccine (6 - 2024-25 season) 06/20/2023   Flu Shot  05/19/2024   Mammogram  06/01/2024   Hemoglobin A1C  09/08/2024   Eye exam for diabetics  11/05/2024   Yearly kidney health urinalysis for diabetes  11/16/2024   Yearly kidney function blood test for diabetes  12/14/2024   Complete foot exam   03/08/2025   Medicare Annual Wellness Visit  03/23/2025   DTaP/Tdap/Td vaccine (3 - Td or Tdap) 03/08/2034   Pneumonia Vaccine  Completed   DEXA scan (bone density measurement)  Completed   Hepatitis C Screening  Completed   HPV Vaccine  Aged Out   Meningitis B Vaccine  Aged Out    Advanced directives: (Copy Requested) Please bring a copy of your health care power of attorney and living will to the office to be added to your chart at your convenience. You can mail to [facility] [address] [place], [place] [address] or email to [email or web address] Advance Care Planning is important because it:  [x]  Makes sure you receive the medical care that is consistent with your values, goals, and preferences  [x]  It provides guidance to your family and loved ones and reduces their decisional burden about whether or not they are making the right decisions based on your wishes.  Follow the link provided in your after visit summary or read over the paperwork we have mailed to you to help you started getting your Advance Directives in place. If you need assistance in completing these, please reach out to us  so that we can help you!  See attachments for Preventive Care and Fall Prevention Tips.

## 2024-03-31 ENCOUNTER — Other Ambulatory Visit: Payer: Self-pay

## 2024-04-28 ENCOUNTER — Other Ambulatory Visit: Payer: Self-pay

## 2024-05-10 ENCOUNTER — Ambulatory Visit: Payer: Medicare Other | Admitting: Internal Medicine

## 2024-07-01 ENCOUNTER — Other Ambulatory Visit: Payer: Self-pay | Admitting: Internal Medicine

## 2024-07-01 DIAGNOSIS — E109 Type 1 diabetes mellitus without complications: Secondary | ICD-10-CM

## 2024-07-01 DIAGNOSIS — E1165 Type 2 diabetes mellitus with hyperglycemia: Secondary | ICD-10-CM

## 2024-07-03 NOTE — Telephone Encounter (Signed)
 No longer under provider care Requested Prescriptions  Pending Prescriptions Disp Refills   pantoprazole  (PROTONIX ) 40 MG tablet [Pharmacy Med Name: PANTOPRAZOLE  SOD DR 40 MG TAB] 90 tablet     Sig: TAKE 1 TABLET BY MOUTH EVERY DAY     Gastroenterology: Proton Pump Inhibitors Passed - 07/03/2024  1:23 PM      Passed - Valid encounter within last 12 months    Recent Outpatient Visits           3 months ago Essential hypertension   [place] [facility], Leita DEL, MD   6 months ago Essential hypertension   [place] [facility] at [facility], Leita DEL, MD               metFORMIN  (GLUCOPHAGE ) 1000 MG tablet [Pharmacy Med Name: METFORMIN  HCL 1,000 MG TABLET] 180 tablet 3    Sig: TAKE 1 TABLET BY MOUTH TWICE A DAY     Endocrinology:  Diabetes - Biguanides Failed - 07/03/2024  1:23 PM      Failed - B12 Level in normal range and within 720 days    Vitamin B-12  Date Value Ref Range Status  02/09/2024 1,179 (H) 180 - 914 pg/mL Final    Comment:    (NOTE) This assay is not validated for testing neonatal or myeloproliferative syndrome specimens for Vitamin B12 levels. Performed at [facility], [address], [place], [place] [address]          Failed - CBC within normal limits and completed in the last 12 months    WBC  Date Value Ref Range Status  04/28/2023 6.3 4.0 - 10.5 K/uL Final   WBC Count  Date Value Ref Range Status  02/09/2024 6.2 4.0 - 10.5 K/uL Final   RBC  Date Value Ref Range Status  02/09/2024 3.69 (L) 3.87 - 5.11 MIL/uL Final   RBC.  Date Value Ref Range Status  02/09/2024 3.74 (L) 3.87 - 5.11 MIL/uL Final   Hemoglobin  Date Value Ref Range Status  02/09/2024 10.7 (L) 12.0 - 15.0 g/dL Final  92/70/7979 88.6 11.1 - 15.9 g/dL Final   HCT  Date Value Ref Range Status  02/09/2024 32.6 (L) 36.0 - 46.0 % Final   Hematocrit  Date Value Ref Range Status   05/17/2019 35.0 34.0 - 46.6 % Final   MCHC  Date Value Ref Range Status  02/09/2024 32.8 30.0 - 36.0 g/dL Final   [facility]  Date Value Ref Range Status  02/09/2024 29.0 26.0 - 34.0 pg Final   MCV  Date Value Ref Range Status  02/09/2024 88.3 80.0 - 100.0 fL Final  05/17/2019 86 79 - 97 fL Final  08/09/2013 84 80 - 100 fL Final   No results found for: PLTCOUNTKUC, LABPLAT, POCPLA RDW  Date Value Ref Range Status  02/09/2024 14.4 11.5 - 15.5 % Final  05/17/2019 15.1 11.7 - 15.4 % Final  08/09/2013 15.3 (H) 11.5 - 14.5 % Final         Passed - Cr in normal range and within 360 days    Creatinine  Date Value Ref Range Status  08/11/2013 1.08 0.60 - 1.30 mg/dL Final   Creatinine, Ser  Date Value Ref Range Status  12/15/2023 0.92 0.57 - 1.00 mg/dL Final   Creatinine, Urine  Date Value Ref Range Status  11/17/2023 61  Final  Passed - HBA1C is between 0 and 7.9 and within 180 days    Hemoglobin A1C  Date Value Ref Range Status  03/08/2024 5.5 4.0 - 5.6 % Final  01/15/2022 6.1  Final   Hgb A1c MFr Bld  Date Value Ref Range Status  12/15/2023 5.9 (H) 4.8 - 5.6 % Final    Comment:             Prediabetes: 5.7 - 6.4          Diabetes: >6.4          Glycemic control for adults with diabetes: <7.0          Passed - eGFR in normal range and within 360 days    EGFR (African American)  Date Value Ref Range Status  08/11/2013 >60  Final   GFR calc Af Amer  Date Value Ref Range Status  10/01/2020 75 >59 mL/min/1.73 Final    Comment:    **In accordance with recommendations from the NKF-ASN Task force,**   [facility] is in the process of updating its eGFR calculation to the   2021 CKD-EPI creatinine equation that estimates kidney function   without a race variable.    EGFR (Non-African Amer.)  Date Value Ref Range Status  08/11/2013 55 (L)  Final    Comment:    eGFR values <37mL/min/1.73 m2 may be an indication of chronic kidney disease (CKD). Calculated  eGFR is useful in patients with stable renal function. The eGFR calculation will not be reliable in acutely ill patients when serum creatinine is changing rapidly. It is not useful in  patients on dialysis. The eGFR calculation may not be applicable to patients at the low and high extremes of body sizes, pregnant women, and vegetarians.    GFR, Estimated  Date Value Ref Range Status  01/20/2023 52 (L) >60 mL/min Final    Comment:    (NOTE) Calculated using the CKD-EPI Creatinine Equation (2021)    eGFR  Date Value Ref Range Status  12/15/2023 67 >59 mL/min/1.73 Final         Passed - Valid encounter within last 6 months    Recent Outpatient Visits           3 months ago Essential hypertension   [place] [facility], Leita DEL, MD   6 months ago Essential hypertension   [place] [facility], Leita DEL, MD               hydrochlorothiazide  (HYDRODIURIL ) 25 MG tablet [Pharmacy Med Name: HYDROCHLOROTHIAZIDE  25 MG TAB] 90 tablet 3    Sig: TAKE 1 TABLET BY MOUTH EVERY DAY     Cardiovascular: Diuretics - Thiazide Failed - 07/03/2024  1:23 PM      Failed - Cr in normal range and within 180 days    Creatinine  Date Value Ref Range Status  08/11/2013 1.08 0.60 - 1.30 mg/dL Final   Creatinine, Ser  Date Value Ref Range Status  12/15/2023 0.92 0.57 - 1.00 mg/dL Final   Creatinine, Urine  Date Value Ref Range Status  11/17/2023 61  Final         Failed - K in normal range and within 180 days    Potassium  Date Value Ref Range Status  12/15/2023 5.2 3.5 - 5.2 mmol/L Final  08/11/2013 5.7 (H) 3.5 - 5.1 mmol/L Final         Failed -  Na in normal range and within 180 days    Sodium  Date Value Ref Range Status  12/15/2023 141 134 - 144 mmol/L Final  08/11/2013 144 136 - 145 mmol/L Final         Passed - Last BP in normal range    BP Readings from Last 1 Encounters:   03/08/24 128/66         Passed - Valid encounter within last 6 months    Recent Outpatient Visits           3 months ago Essential hypertension   [place] [facility] at [facility], Leita DEL, MD   6 months ago Essential hypertension   [facility] at [facility], Leita DEL, MD

## 2024-08-03 ENCOUNTER — Other Ambulatory Visit (HOSPITAL_COMMUNITY): Payer: Self-pay

## 2024-08-09 ENCOUNTER — Encounter: Payer: Self-pay | Admitting: Oncology

## 2024-08-16 ENCOUNTER — Inpatient Hospital Stay: Attending: Oncology

## 2024-08-16 ENCOUNTER — Encounter: Payer: Self-pay | Admitting: Oncology

## 2024-08-16 DIAGNOSIS — D631 Anemia in chronic kidney disease: Secondary | ICD-10-CM | POA: Insufficient documentation

## 2024-08-16 DIAGNOSIS — E538 Deficiency of other specified B group vitamins: Secondary | ICD-10-CM | POA: Diagnosis present

## 2024-08-16 DIAGNOSIS — N182 Chronic kidney disease, stage 2 (mild): Secondary | ICD-10-CM | POA: Insufficient documentation

## 2024-08-16 DIAGNOSIS — D508 Other iron deficiency anemias: Secondary | ICD-10-CM

## 2024-08-16 LAB — CBC WITH DIFFERENTIAL (CANCER CENTER ONLY)
Abs Immature Granulocytes: 0.08 K/uL — ABNORMAL HIGH (ref 0.00–0.07)
Basophils Absolute: 0 K/uL (ref 0.0–0.1)
Basophils Relative: 1 %
Eosinophils Absolute: 0.3 K/uL (ref 0.0–0.5)
Eosinophils Relative: 6 %
HCT: 31.7 % — ABNORMAL LOW (ref 36.0–46.0)
Hemoglobin: 10.5 g/dL — ABNORMAL LOW (ref 12.0–15.0)
Immature Granulocytes: 1 %
Lymphocytes Relative: 18 %
Lymphs Abs: 1 K/uL (ref 0.7–4.0)
MCH: 29.3 pg (ref 26.0–34.0)
MCHC: 33.1 g/dL (ref 30.0–36.0)
MCV: 88.5 fL (ref 80.0–100.0)
Monocytes Absolute: 0.6 K/uL (ref 0.1–1.0)
Monocytes Relative: 11 %
Neutro Abs: 3.6 K/uL (ref 1.7–7.7)
Neutrophils Relative %: 63 %
Platelet Count: 195 K/uL (ref 150–400)
RBC: 3.58 MIL/uL — ABNORMAL LOW (ref 3.87–5.11)
RDW: 14.3 % (ref 11.5–15.5)
WBC Count: 5.7 K/uL (ref 4.0–10.5)
nRBC: 0 % (ref 0.0–0.2)

## 2024-08-16 LAB — FERRITIN: Ferritin: 150 ng/mL (ref 11–307)

## 2024-08-16 LAB — RETIC PANEL
Immature Retic Fract: 17.6 % — ABNORMAL HIGH (ref 2.3–15.9)
RBC.: 3.57 MIL/uL — ABNORMAL LOW (ref 3.87–5.11)
Retic Count, Absolute: 68.2 K/uL (ref 19.0–186.0)
Retic Ct Pct: 1.9 % (ref 0.4–3.1)
Reticulocyte Hemoglobin: 30.8 pg (ref >27.9)

## 2024-08-16 LAB — IRON AND TIBC
Iron: 59 ug/dL (ref 28–170)
Saturation Ratios: 20 % (ref 10.4–31.8)
TIBC: 302 ug/dL (ref 250–450)
UIBC: 243 ug/dL

## 2024-08-16 LAB — VITAMIN B12: Vitamin B-12: 832 pg/mL (ref 180–914)

## 2024-08-23 ENCOUNTER — Inpatient Hospital Stay

## 2024-08-23 ENCOUNTER — Inpatient Hospital Stay: Attending: Oncology | Admitting: Oncology

## 2024-08-23 ENCOUNTER — Encounter: Payer: Self-pay | Admitting: Oncology

## 2024-08-23 VITALS — BP 152/69 | HR 75

## 2024-08-23 VITALS — BP 142/63 | HR 74 | Temp 96.6°F | Resp 18

## 2024-08-23 DIAGNOSIS — D631 Anemia in chronic kidney disease: Secondary | ICD-10-CM | POA: Insufficient documentation

## 2024-08-23 DIAGNOSIS — D508 Other iron deficiency anemias: Secondary | ICD-10-CM | POA: Insufficient documentation

## 2024-08-23 DIAGNOSIS — N189 Chronic kidney disease, unspecified: Secondary | ICD-10-CM

## 2024-08-23 DIAGNOSIS — E538 Deficiency of other specified B group vitamins: Secondary | ICD-10-CM | POA: Diagnosis not present

## 2024-08-23 DIAGNOSIS — N182 Chronic kidney disease, stage 2 (mild): Secondary | ICD-10-CM | POA: Diagnosis not present

## 2024-08-23 MED ORDER — IRON SUCROSE 20 MG/ML IV SOLN
200.0000 mg | Freq: Once | INTRAVENOUS | Status: AC
  Administered 2024-08-23: 200 mg via INTRAVENOUS
  Filled 2024-08-23: qty 10

## 2024-08-23 MED ORDER — SODIUM CHLORIDE 0.9% FLUSH
10.0000 mL | Freq: Once | INTRAVENOUS | Status: AC | PRN
  Administered 2024-08-23: 10 mL
  Filled 2024-08-23: qty 10

## 2024-08-23 NOTE — Assessment & Plan Note (Signed)
 continue Vitamin B12 supplementation, recommend to decrease frequency to 3 times per week. B12 level is stable

## 2024-08-23 NOTE — Progress Notes (Signed)
 Hematology/Oncology Progress note [phone] [phone]        REFERRING PROVIDER: Justus Leita DEL, MD  CHIEF COMPLAINTS/REASON FOR VISIT:  Anemia due to chronic kidney disease  ASSESSMENT & PLAN:   Anemia due to chronic kidney disease Labs reviewed and discussed with patient. Lab Results  Component Value Date   HGB 10.5 (L) 08/16/2024   TIBC 302 08/16/2024   IRONPCTSAT 20 08/16/2024   FERRITIN 150 08/16/2024   Hemoglobin slightly decreased.   recommend additional  Venofer  200 mg x2.  Goals of ferritin is around 200. No need for erythropoietin therapy given that hemoglobin is above 10.  Patient has difficulty completing colonoscopy prep due to mobility issues. Recommend Cologuard test to be done annually.   B12 deficiency  continue Vitamin B12 supplementation, recommend to decrease frequency to 3 times per week. B12 level is stable  CKD (chronic kidney disease) stage 2, GFR 60-89 ml/min Encourage oral hydration and avoid nephrotoxins.    Orders Placed This Encounter  Procedures   CBC with Differential (Cancer Center Only)    Standing Status:   Future    Expected Date:   02/20/2025    Expiration Date:   05/21/2025   Iron  and TIBC    Standing Status:   Future    Expected Date:   02/20/2025    Expiration Date:   05/21/2025   Ferritin    Standing Status:   Future    Expected Date:   02/20/2025    Expiration Date:   05/21/2025   Vitamin B12    Standing Status:   Future    Expected Date:   02/20/2025    Expiration Date:   05/21/2025   Follow up 6 months.   All questions were answered. The patient knows to call the clinic with any problems, questions or concerns.  Zelphia Cap, MD, PhD [facility] Hematology Oncology 08/23/2024     HISTORY OF PRESENTING ILLNESS:  Maria Singh is a  73 y.o.  female with PMH listed below who was referred to me for anemia Reviewed patient's recent labs that was done.  She was found to have abnormal CBC on 12/30/22  with Hb 9.8, mcv 80.4, normal wbc and platelet.  Reviewed patient's previous labs ordered by primary care physician's office, anemia is chronic onset , duration is since at least 2016, baseline level was in 11s in 2023.  She takes oral iron  supplementation, recently off.  Mentioned occasional constipation, stating It's not that bad, but once in a while. She had not noticed any recent bleeding such as epistaxis, hematuria or hematochezia.  Patient has a history of Stage 2 chronic kidney disease with normal creatinine levels, managed by Dr. Marcelino. Patient reports variable energy levels and is basically in the wheelchair. Charcot Marie Tooth muscular atrophy  - No chest pain, shortness of breath, or blood in stool reported. Mentioned occasional constipation, stating It's not that bad, but once in a while.. she is not able to get colonoscopy due to not able to tolerate the prep.  She has taken annual Fecal immunochemical tests    INTERVAL HISTORY Maria Singh is a 73 y.o. female who has above history reviewed by me today presents for follow up visit for anemia. Patient has previously received IV Venofer  treatments.   No other new complaints. Urinary incontinence.  Patient takes vitamin B12 supplementation every other day. She was accompanied by Dr. Molt.   MEDICAL HISTORY:  Past Medical History:  Diagnosis Date  Anemia    Charcot Marie Tooth muscular atrophy    Chronic kidney disease    Depression    Diabetes mellitus without complication (HCC)    GERD (gastroesophageal reflux disease)    Hyperlipidemia    Hypertension    Rhabdomyolysis 12/06/2019   Rotator cuff injury Nov. 2012   left arm   Sepsis (HCC) 07/16/2018   Sepsis (HCC) 07/16/2018   Sleep apnea     SURGICAL HISTORY: Past Surgical History:  Procedure Laterality Date   arm surgery Left    broken arm   FRACTURE SURGERY  1998 ?   Left arm   HERNIA REPAIR      SOCIAL HISTORY: Social History    Socioeconomic History   Marital status: Married    Spouse name: Not on file   Number of children: Not on file   Years of education: Not on file   Highest education level: Not on file  Occupational History   Not on file  Tobacco Use   Smoking status: Never   Smokeless tobacco: Never  Vaping Use   Vaping status: Never Used  Substance and Sexual Activity   Alcohol use: Not Currently    Alcohol/week: 0.0 standard drinks of alcohol    Comment: very very rare   Drug use: Never   Sexual activity: Not Currently    Birth control/protection: None  Other Topics Concern   Not on file  Social History Narrative   Not on file   Social Drivers of Health   Financial Resource Strain: Low Risk  (04/03/2024)   Received from [facility]   Overall Financial Resource Strain (CARDIA)    Difficulty of Paying Living Expenses: Not hard at all  Food Insecurity: No Food Insecurity (04/03/2024)   Received from [facility]   Hunger Vital Sign    Within the past 12 months, you worried that your food would run out before you got the money to buy more.: Never true    Within the past 12 months, the food you bought just didn't last and you didn't have money to get more.: Never true  Transportation Needs: No Transportation Needs (04/03/2024)   Received from [facility] - Transportation    In the past 12 months, has lack of transportation kept you from medical appointments or from getting medications?: No    Lack of Transportation (Non-Medical): No  Physical Activity: Inactive (03/23/2024)   Exercise Vital Sign    Days of Exercise per Week: 0 days    Minutes of Exercise per Session: 0 min  Stress: No Stress Concern Present (03/23/2024)   [employer] of Occupational Health - Occupational Stress Questionnaire    Feeling of Stress : Not at all  Social Connections: Socially Integrated (03/23/2024)   Social Connection and Isolation Panel     Frequency of Communication with Friends and Family: More than three times a week    Frequency of Social Gatherings with Friends and Family: More than three times a week    Attends Religious Services: More than 4 times per year    Active Member of [employer] or Organizations: Yes    Attends Banker Meetings: More than 4 times per year    Marital Status: Married  Catering Manager Violence: Not At Risk (03/23/2024)   Humiliation, Afraid, Rape, and Kick questionnaire    Fear of Current or Ex-Partner: No    Emotionally Abused: No    Physically Abused: No  Sexually Abused: No    FAMILY HISTORY: Family History  Problem Relation Age of Onset   Cancer Mother    Heart disease Father    Diabetes Maternal Grandmother    Hypertension Maternal Grandmother    Obesity Maternal Grandmother    Heart disease Paternal Grandfather    Breast cancer Maternal Aunt        mat great aunt    ALLERGIES:  is allergic to metronidazole  and other nitroimidazoles and sulfa antibiotics.  MEDICATIONS:  Current Outpatient Medications  Medication Sig Dispense Refill   Accu-Chek FastClix Lancets MISC TEST ONCE DAILY 102 each 2   acetaminophen  (TYLENOL ) 500 MG tablet Take 500 mg by mouth every 6 (six) hours as needed.     amLODipine  (NORVASC ) 5 MG tablet Take 1 tablet (5 mg total) by mouth daily. 90 tablet 0   aspirin  81 MG tablet Take 81 mg by mouth daily.     blood glucose meter kit and supplies KIT Dispense based on patient and insurance preference. Use up to four times daily as directed. 1 each 0   Cholecalciferol (VITAMIN D ) 50 MCG (2000 UT) tablet Take 2,000 Units by mouth daily.     Continuous Glucose Sensor (FREESTYLE LIBRE 3 SENSOR) MISC 1 each by Does not apply route every 14 (fourteen) days. Use to check glucose continuously 6 each 3   cyanocobalamin  (VITAMIN B12) 1000 MCG tablet Take 1 tablet (1,000 mcg total) by mouth daily. (Patient taking differently: Take 1,000 mcg by mouth daily. Taking one  every other day.) 90 tablet 0   diclofenac  Sodium (VOLTAREN ) 1 % GEL Apply 2 g topically 4 (four) times daily. To affected joint. 100 g 1   docusate sodium  (COLACE) 100 MG capsule Take 100 mg by mouth daily.      Famotidine  (PEPCID  PO) Take by mouth daily. Dr. Roxianne     Glucagon  (GVOKE HYPOPEN  2-PACK) 1 MG/0.2ML SOAJ Inject 1 each into the skin daily as needed. 0.4 mL 0   glucose blood (FREESTYLE LITE) test strip Use up to 4 (four) times daily as directed 100 each 0   hydrochlorothiazide  (HYDRODIURIL ) 25 MG tablet Take 1 tablet (25 mg total) by mouth daily. 90 tablet 3   icosapent  Ethyl (VASCEPA ) 1 g capsule Take 2 capsules (2 g total) by mouth 2 (two) times daily. 360 capsule 3   insulin  glargine-yfgn (SEMGLEE , YFGN,) 100 UNIT/ML Pen Inject 60 Units into the skin in the morning AND 50 Units every evening. 30 mL 0   Insulin  Pen Needle (COMFORT EZ PEN NEEDLES) 31G X 6 MM MISC Use as directed 3 times a day 300 each 1   LANTUS  SOLOSTAR 100 UNIT/ML Solostar Pen 50 units am 40 units pm     loratadine (CLARITIN) 10 MG tablet Take 10 mg by mouth daily.     metFORMIN  (GLUCOPHAGE ) 1000 MG tablet Take 1 tablet (1,000 mg total) by mouth 2 (two) times daily. 180 tablet 3   mupirocin  ointment (BACTROBAN ) 2 % Apply daily to open sores on legs/feet until resolved 30 g 12   NON FORMULARY Auto CPap nightly     nystatin  cream (MYCOSTATIN ) Apply 1 Application topically 2 (two) times daily. 60 g 12   ondansetron  (ZOFRAN ) 4 MG tablet Take 1-2 tablets (4-8 mg total) by mouth every 8 (eight) hours as needed for nausea or vomiting. 40 tablet 1   patiromer  (VELTASSA ) 8.4 g packet Take 1 packet (8.4 g total) by mouth daily. 30 each 11   rosuvastatin  (  CRESTOR ) 5 MG tablet Take 1 tablet (5 mg total) by mouth daily. 90 tablet 3   Semaglutide , 1 MG/DOSE, (OZEMPIC , 1 MG/DOSE,) 4 MG/3ML SOPN Inject 1 mg into the skin once a week. 3 mL 1   sertraline  (ZOLOFT ) 100 MG tablet Take 1 tablet (100 mg total) by mouth daily. 90 tablet  0   sodium bicarbonate  650 MG tablet Take 1 tablet (650 mg total) by mouth in the morning and at bedtime. 180 tablet 0   clotrimazole -betamethasone  (LOTRISONE ) cream Apply 1 application topically 2 (two) times daily. (Patient not taking: Reported on 08/23/2024) 30 g 1   triamcinolone  cream (KENALOG ) 0.1 % Apply 1 Application topically daily as needed. Qd up to 5 days a week top of feet until clear, avoid face, groin, axilla (Patient not taking: Reported on 08/23/2024) 80 g 0   No current facility-administered medications for this visit.    Review of Systems  Constitutional:  Positive for fatigue. Negative for appetite change, chills, fever and unexpected weight change.  HENT:   Negative for hearing loss and voice change.   Eyes:  Negative for eye problems.  Respiratory:  Negative for chest tightness, cough and shortness of breath.   Cardiovascular:  Negative for chest pain.  Gastrointestinal:  Negative for abdominal distention, abdominal pain, blood in stool and constipation.       Occasional constipation  Genitourinary:  Negative for difficulty urinating.   Musculoskeletal:  Positive for arthralgias.  Skin:  Negative for itching and rash.  Neurological:  Positive for extremity weakness.  Hematological:  Negative for adenopathy.  Psychiatric/Behavioral:  Negative for confusion.     PHYSICAL EXAMINATION: Vitals:   08/23/24 1344  BP: (!) 142/63  Pulse: 74  Resp: 18  Temp: (!) 96.6 F (35.9 C)  SpO2: 96%   There were no vitals filed for this visit.   Physical Exam Constitutional:      General: She is not in acute distress.    Appearance: She is obese.     Comments: Patient sits in the wheelchair  HENT:     Head: Normocephalic and atraumatic.  Eyes:     General: No scleral icterus. Cardiovascular:     Rate and Rhythm: Normal rate and regular rhythm.  Pulmonary:     Effort: Pulmonary effort is normal. No respiratory distress.     Breath sounds: No wheezing.  Abdominal:      General: Bowel sounds are normal. There is no distension.     Palpations: Abdomen is soft.  Musculoskeletal:     Cervical back: Normal range of motion and neck supple.  Lymphadenopathy:     Cervical: No cervical adenopathy.  Skin:    General: Skin is warm.  Neurological:     Mental Status: She is alert and oriented to person, place, and time. Mental status is at baseline.  Psychiatric:        Mood and Affect: Mood normal.      LABORATORY DATA:  I have reviewed the data as listed    Latest Ref Rng & Units 08/16/2024    1:56 PM 02/09/2024    1:20 PM 11/17/2023   12:00 AM  CBC  WBC 4.0 - 10.5 K/uL 5.7  6.2  7.1      Hemoglobin 12.0 - 15.0 g/dL 89.4  89.2  88.8      Hematocrit 36.0 - 46.0 % 31.7  32.6  33      Platelets 150 - 400 K/uL 195  180  194  This result is from an external source.      Latest Ref Rng & Units 12/15/2023    2:02 PM 11/17/2023   12:00 AM 01/20/2023    4:04 PM  CMP  Glucose 70 - 99 mg/dL 91   880   BUN 8 - 27 mg/dL 29  34     39   Creatinine 0.57 - 1.00 mg/dL 9.07  1.0     8.85   Sodium 134 - 144 mmol/L 141  141     139   Potassium 3.5 - 5.2 mmol/L 5.2  4.4     4.9   Chloride 96 - 106 mmol/L 104  108     105   CO2 20 - 29 mmol/L 22  20     23    Calcium  8.7 - 10.3 mg/dL 9.7   9.3   Total Protein 6.0 - 8.5 g/dL 6.6   7.4   Total Bilirubin 0.0 - 1.2 mg/dL <9.7   0.3   Alkaline Phos 44 - 121 IU/L 26   26   AST 0 - 40 IU/L 21   26   ALT 0 - 32 IU/L 21   22      This result is from an external source.       Lab Results  Component Value Date   FERRITIN 150 08/16/2024   IRONPCTSAT 20 08/16/2024   TIBC 302 08/16/2024   IRON  59 08/16/2024      RADIOGRAPHIC STUDIES: I have personally reviewed the radiological images as listed and agreed with the findings in the report. No results found.

## 2024-08-23 NOTE — Assessment & Plan Note (Signed)
 Encourage oral hydration and avoid nephrotoxins.

## 2024-08-23 NOTE — Assessment & Plan Note (Addendum)
 Labs reviewed and discussed with patient. Lab Results  Component Value Date   HGB 10.5 (L) 08/16/2024   TIBC 302 08/16/2024   IRONPCTSAT 20 08/16/2024   FERRITIN 150 08/16/2024   Hemoglobin slightly decreased.   recommend additional  Venofer  200 mg x2.  Goals of ferritin is around 200. No need for erythropoietin therapy given that hemoglobin is above 10.  Patient has difficulty completing colonoscopy prep due to mobility issues. Recommend Cologuard test to be done annually.

## 2024-09-27 ENCOUNTER — Encounter: Payer: Self-pay | Admitting: Oncology

## 2024-10-04 ENCOUNTER — Encounter: Payer: Self-pay | Admitting: Oncology

## 2024-10-04 ENCOUNTER — Inpatient Hospital Stay: Attending: Oncology

## 2024-10-04 ENCOUNTER — Inpatient Hospital Stay

## 2024-10-04 VITALS — BP 121/56 | HR 84 | Temp 98.3°F | Resp 16

## 2024-10-04 DIAGNOSIS — D631 Anemia in chronic kidney disease: Secondary | ICD-10-CM | POA: Insufficient documentation

## 2024-10-04 DIAGNOSIS — N182 Chronic kidney disease, stage 2 (mild): Secondary | ICD-10-CM | POA: Diagnosis present

## 2024-10-04 MED ORDER — IRON SUCROSE 20 MG/ML IV SOLN
200.0000 mg | Freq: Once | INTRAVENOUS | Status: AC
  Administered 2024-10-04: 15:00:00 200 mg via INTRAVENOUS
  Filled 2024-10-04: qty 10

## 2024-10-04 NOTE — Patient Instructions (Signed)

## 2025-02-21 ENCOUNTER — Inpatient Hospital Stay

## 2025-02-28 ENCOUNTER — Inpatient Hospital Stay

## 2025-02-28 ENCOUNTER — Inpatient Hospital Stay: Admitting: Oncology

## 2025-03-29 ENCOUNTER — Ambulatory Visit
